# Patient Record
Sex: Male | Born: 1960
Health system: Southern US, Community
[De-identification: ages and names within clinical notes are randomized; demographics above are authoritative.]

## PROBLEM LIST (undated history)

## (undated) DIAGNOSIS — Z8489 Family history of other specified conditions: Secondary | ICD-10-CM

## (undated) DIAGNOSIS — Z973 Presence of spectacles and contact lenses: Secondary | ICD-10-CM

## (undated) DIAGNOSIS — Z8719 Personal history of other diseases of the digestive system: Secondary | ICD-10-CM

## (undated) DIAGNOSIS — Z72820 Sleep deprivation: Secondary | ICD-10-CM

## (undated) DIAGNOSIS — T7840XA Allergy, unspecified, initial encounter: Secondary | ICD-10-CM

## (undated) DIAGNOSIS — K219 Gastro-esophageal reflux disease without esophagitis: Secondary | ICD-10-CM

## (undated) DIAGNOSIS — I10 Essential (primary) hypertension: Secondary | ICD-10-CM

## (undated) DIAGNOSIS — R918 Other nonspecific abnormal finding of lung field: Secondary | ICD-10-CM

## (undated) DIAGNOSIS — K224 Dyskinesia of esophagus: Secondary | ICD-10-CM

## (undated) DIAGNOSIS — G4700 Insomnia, unspecified: Secondary | ICD-10-CM

## (undated) DIAGNOSIS — Z87442 Personal history of urinary calculi: Secondary | ICD-10-CM

## (undated) DIAGNOSIS — E559 Vitamin D deficiency, unspecified: Secondary | ICD-10-CM

## (undated) DIAGNOSIS — K4090 Unilateral inguinal hernia, without obstruction or gangrene, not specified as recurrent: Secondary | ICD-10-CM

## (undated) DIAGNOSIS — J45909 Unspecified asthma, uncomplicated: Secondary | ICD-10-CM

## (undated) DIAGNOSIS — J329 Chronic sinusitis, unspecified: Secondary | ICD-10-CM

## (undated) DIAGNOSIS — K5730 Diverticulosis of large intestine without perforation or abscess without bleeding: Secondary | ICD-10-CM

## (undated) DIAGNOSIS — E349 Endocrine disorder, unspecified: Secondary | ICD-10-CM

## (undated) DIAGNOSIS — R053 Chronic cough: Secondary | ICD-10-CM

## (undated) DIAGNOSIS — K5792 Diverticulitis of intestine, part unspecified, without perforation or abscess without bleeding: Secondary | ICD-10-CM

## (undated) DIAGNOSIS — A6920 Lyme disease, unspecified: Secondary | ICD-10-CM

## (undated) DIAGNOSIS — R6881 Early satiety: Secondary | ICD-10-CM

## (undated) DIAGNOSIS — G4730 Sleep apnea, unspecified: Secondary | ICD-10-CM

## (undated) DIAGNOSIS — R05 Cough: Secondary | ICD-10-CM

## (undated) HISTORY — PX: VASECTOMY: SHX75

## (undated) HISTORY — DX: Unspecified asthma, uncomplicated: J45.909

## (undated) HISTORY — PX: HERNIA REPAIR: SHX51

## (undated) HISTORY — PX: TOE SURGERY: SHX1073

## (undated) HISTORY — DX: Diverticulosis of large intestine without perforation or abscess without bleeding: K57.30

## (undated) HISTORY — PX: UPPER GASTROINTESTINAL ENDOSCOPY: SHX188

## (undated) HISTORY — DX: Sleep deprivation: Z72.820

## (undated) HISTORY — DX: Chronic sinusitis, unspecified: J32.9

## (undated) HISTORY — DX: Lyme disease, unspecified: A69.20

## (undated) HISTORY — DX: Allergy, unspecified, initial encounter: T78.40XA

## (undated) HISTORY — PX: COLONOSCOPY: SHX174

---
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
## 2000-12-01 ENCOUNTER — Encounter: Admission: RE | Admit: 2000-12-01 | Discharge: 2000-12-01 | Payer: Self-pay | Admitting: Otolaryngology

## 2000-12-01 ENCOUNTER — Encounter: Payer: Self-pay | Admitting: Otolaryngology

## 2001-12-14 ENCOUNTER — Encounter: Payer: Self-pay | Admitting: Internal Medicine

## 2001-12-14 ENCOUNTER — Ambulatory Visit (HOSPITAL_COMMUNITY): Admission: RE | Admit: 2001-12-14 | Discharge: 2001-12-14 | Payer: Self-pay | Admitting: Internal Medicine

## 2001-12-18 ENCOUNTER — Ambulatory Visit (HOSPITAL_COMMUNITY): Admission: RE | Admit: 2001-12-18 | Discharge: 2001-12-18 | Payer: Self-pay | Admitting: Surgery

## 2004-02-13 ENCOUNTER — Ambulatory Visit (HOSPITAL_COMMUNITY): Admission: RE | Admit: 2004-02-13 | Discharge: 2004-02-13 | Payer: Self-pay | Admitting: Internal Medicine

## 2010-09-24 ENCOUNTER — Encounter: Payer: Self-pay | Admitting: Internal Medicine

## 2010-11-19 ENCOUNTER — Encounter (INDEPENDENT_AMBULATORY_CARE_PROVIDER_SITE_OTHER): Payer: Self-pay

## 2010-11-23 ENCOUNTER — Ambulatory Visit
Admission: RE | Admit: 2010-11-23 | Discharge: 2010-11-23 | Payer: Self-pay | Source: Home / Self Care | Attending: Gastroenterology | Admitting: Gastroenterology

## 2010-12-07 NOTE — Letter (Signed)
Summary: Pre Visit Letter Revised  [facility]  [address] [place], [place] [address]   Phone: [phone]  Fax: [phone]        09/24/2010 MRN: [id number]  [facility] [address] [place], [place]  [address]             Procedure Date:  12-07-10 11:30am           Dr Leone Payor   Welcome to the Gastroenterology Division at [facility].    You are scheduled to see a nurse for your pre-procedure visit on 11-23-2010 at 10am on the 3rd floor at [facility], [address]  We ask that you try to arrive at our office 15 minutes prior to your appointment time to allow for check-in.  Please take a minute to review the attached form.  If you answer "Yes" to one or more of the questions on the first page, we ask that you call the person listed at your earliest opportunity.  If you answer "No" to all of the questions, please complete the rest of the form and bring it to your appointment.    Your nurse visit will consist of discussing your medical and surgical history, your immediate family medical history, and your medications.   If you are unable to list all of your medications on the form, please bring the medication bottles to your appointment and we will list them.  We will need to be aware of both prescribed and over the counter drugs.  We will need to know exact dosage information as well.    Please be prepared to read and sign documents such as consent forms, a financial agreement, and acknowledgement forms.  If necessary, and with your consent, a friend or relative is welcome to sit-in on the nurse visit with you.  Please bring your insurance card so that we may make a copy of it.  If your insurance requires a referral to see a specialist, please bring your referral form from your primary care physician.  No co-pay is required for this nurse visit.     If you cannot keep your appointment, please call [phone] to cancel or reschedule prior to your  appointment date.  This allows [place] the opportunity to schedule an appointment for another patient in need of care.    Thank you for choosing [facility] for your medical needs.  We appreciate the opportunity to care for you.  Please visit [place] at our website  to learn more about our practice.  Sincerely, The Gastroenterology Division

## 2010-12-09 NOTE — Letter (Signed)
Summary: [facility] Instructions  [facility]  [address] [place], [place] [address]   Phone: [phone]  Fax: [phone]       Jay Brooks    [birth year]    MRN: [id number]        Procedure Day Dorna Bloom:  Jay Brooks  12/21/10     Arrival Time:  9:00AM     Procedure Time:  10:00AM     Location of Procedure:                    [facility] (4th Floor)  PREPARATION FOR COLONOSCOPY WITH MOVIPREP   Starting 5 days prior to your procedure 12/16/10 do not eat nuts, seeds, popcorn, corn, beans, peas,  salads, or any raw vegetables.  Do not take any fiber supplements (e.g. Metamucil, Citrucel, and Benefiber).  THE DAY BEFORE YOUR PROCEDURE         DATE: 12/20/10  DAY: MONDAY  1.  Drink clear liquids the entire day-NO SOLID FOOD  2.  Do not drink anything colored red or purple.  Avoid juices with pulp.  No orange juice.  3.  Drink at least 64 oz. (8 glasses) of fluid/clear liquids during the day to prevent dehydration and help the prep work efficiently.  CLEAR LIQUIDS INCLUDE: Water Jello Ice Popsicles Tea (sugar ok, no milk/cream) Powdered fruit flavored drinks Coffee (sugar ok, no milk/cream) Gatorade Juice: apple, white grape, white cranberry  Lemonade Clear bullion, consomm, broth Carbonated beverages (any kind) Strained chicken noodle soup Hard Candy                             4.  In the morning, mix first dose of MoviPrep solution:    Empty 1 Pouch A and 1 Pouch B into the disposable container    Add lukewarm drinking water to the top line of the container. Mix to dissolve    Refrigerate (mixed solution should be used within 24 hrs)  5.  Begin drinking the prep at 5:00 p.m. The MoviPrep container is divided by 4 marks.   Every 15 minutes drink the solution down to the next mark (approximately 8 oz) until the full liter is complete.   6.  Follow completed prep with 16 oz of clear liquid of your choice (Nothing red or purple).   Continue to drink clear liquids until bedtime.  7.  Before going to bed, mix second dose of MoviPrep solution:    Empty 1 Pouch A and 1 Pouch B into the disposable container    Add lukewarm drinking water to the top line of the container. Mix to dissolve    Refrigerate  THE DAY OF YOUR PROCEDURE      DATE: 12/21/10   DAY: TUESDAY  Beginning at 5:00AM (5 hours before procedure):         1. Every 15 minutes, drink the solution down to the next mark (approx 8 oz) until the full liter is complete.  2. Follow completed prep with 16 oz. of clear liquid of your choice.    3. You may drink clear liquids until 8:00AM (2 HOURS BEFORE PROCEDURE).   MEDICATION INSTRUCTIONS  Unless otherwise instructed, you should take regular prescription medications with a small sip of water   as early as possible the morning of your procedure.         OTHER INSTRUCTIONS  You will need a responsible adult at  least 52 years of age to accompany you and drive you home.   This person must remain in the waiting room during your procedure.  Wear loose fitting clothing that is easily removed.  Leave jewelry and other valuables at home.  However, you may wish to bring a book to read or  an iPod/MP3 player to listen to music as you wait for your procedure to start.  Remove all body piercing jewelry and leave at home.  Total time from sign-in until discharge is approximately 2-3 hours.  You should go home directly after your procedure and rest.  You can resume normal activities the  day after your procedure.  The day of your procedure you should not:   Drive   Make legal decisions   Operate machinery   Drink alcohol   Return to work  You will receive specific instructions about eating, activities and medications before you leave.    The above instructions have been reviewed and explained to me by   Ulis Rias RN  November 23, 2010 9:52 AM     I fully understand and can verbalize these  instructions _____________________________ Date _________

## 2010-12-09 NOTE — Miscellaneous (Signed)
Summary: Lec previsit  Clinical Lists Changes  Medications: Added new medication of MOVIPREP 100 GM  SOLR (PEG-KCL-NACL-NASULF-NA ASC-C) As per prep instructions. - Signed Rx of MOVIPREP 100 GM  SOLR (PEG-KCL-NACL-NASULF-NA ASC-C) As per prep instructions.;  #1 x 0;  Signed;  Entered by: Ulis Rias RN;  Authorized by: Iva Boop MD, [facility];  Method used: Electron[facility] to [facility] [phone]*, [address], [place], [place], [place]  [address], Ph: [phone], Fax: [phone] Observations: Added new observation of NKA: T (11/23/2010 9:39)    Prescriptions: MOVIPREP 100 GM  SOLR (PEG-KCL-NACL-NASULF-NA ASC-C) As per prep instructions.  #1 x 0   Entered by:   Ulis Rias RN   Authorized by:   Iva Boop MD, [facility]   Signed by:   Ulis Rias RN on 11/23/2010   Method used:   Electronically to        [facility] [phone]* (retail)       [address]       [place], [place]  [address]       Ph: [phone]       Fax: [phone]   RxID:   [id number]

## 2010-12-20 ENCOUNTER — Telehealth: Payer: Self-pay | Admitting: Internal Medicine

## 2010-12-21 ENCOUNTER — Other Ambulatory Visit (AMBULATORY_SURGERY_CENTER): Payer: 59 | Admitting: Gastroenterology

## 2010-12-21 ENCOUNTER — Encounter: Payer: Self-pay | Admitting: Gastroenterology

## 2010-12-21 DIAGNOSIS — K5730 Diverticulosis of large intestine without perforation or abscess without bleeding: Secondary | ICD-10-CM

## 2010-12-21 DIAGNOSIS — Z1211 Encounter for screening for malignant neoplasm of colon: Secondary | ICD-10-CM

## 2010-12-21 LAB — HM COLONOSCOPY

## 2010-12-29 NOTE — Progress Notes (Signed)
Summary: Questions about med.  Phone Note Call from Patient Call back at [phone]   Caller: Patient Call For: Dr. Leone Payor Reason for Call: Talk to Nurse Summary of Call: Has some questions about a med. he is taking before his procedure tomorrow Initial call taken by: Karna Christmas,  December 20, 2010 9:36 AM  Follow-up for Phone Call        Patient had question about a "root" that he takes to help him sleep.  I told him that it would be ok as long as it doesn't cause increased bleeding.  He also wanted to know if the taste of the prep was bad.  I suggested that he have a hard candy while taking the prep.  Sometimes it helps with the prep.   Patient was content with the answers. Follow-up by: Clide Cliff RN,  December 20, 2010 10:21 AM

## 2010-12-29 NOTE — Procedures (Signed)
Summary: Colonoscopy  Patient: Jay Brooks Note: All result statuses are Final unless otherwise noted.  Tests: (1) Colonoscopy (COL)   COL Colonoscopy           DONE     [facility]     [address]     [place], [place]  [address]           COLONOSCOPY PROCEDURE REPORT           PATIENT:  Jay Brooks, Jay Brooks  MR#:  [id number]     BIRTHDATE:  14-Apr-1961, 49 yrs. old  GENDER:  male     ENDOSCOPIST:  Rachael Fee, MD     REF. BY:  Lucky Cowboy, M.D.     PROCEDURE DATE:  12/21/2010     PROCEDURE:  Diagnostic Colonoscopy     ASA CLASS:  Class II     INDICATIONS:  Routine Risk Screening     MEDICATIONS:   Fentanyl 75 mcg IV, Versed 7 mg IV           DESCRIPTION OF PROCEDURE:   After the risks benefits and     alternatives of the procedure were thoroughly explained, informed     consent was obtained.  Digital rectal exam was performed and     revealed no rectal masses.   The LB PCF-H180AL [id number] endoscope     was introduced through the anus and advanced to the cecum, which     was identified by both the appendix and ileocecal valve, without     limitations.  The quality of the prep was good, using MoviPrep.     The instrument was then slowly withdrawn as the colon was fully     examined.     <<PROCEDUREIMAGES>>     FINDINGS:  Mild diverticulosis was found in the sigmoid to     descending colon segments (see image4).  This was otherwise a     normal examination of the colon (see image1, image2, and image5).     Retroflexed views in the rectum revealed no abnormalities.    The     scope was then withdrawn from the patient and the procedure     completed.     COMPLICATIONS:  None           ENDOSCOPIC IMPRESSION:     1) Mild diverticulosis in the sigmoid to descending colon     segments     2) Otherwise normal examination; no polyps or cancers           RECOMMENDATIONS:     1) You should continue to follow colorectal cancer screening     guidelines for "routine risk"  patients with a repeat colonoscopy     in 10 years. There is no need for FOBT (stool) testing for at     least 5 years.           REPEAT EXAM:  10 years           ______________________________     Rachael Fee, MD           n.     eSIGNED:   Rachael Fee at 12/21/2010 10:35 AM           [place], [phone]  Note: An exclamation mark (!) indicates a result that was not dispersed into the flowsheet. Document Creation Date: 12/21/2010 10:36 AM _______________________________________________________________________  (1) Order result status: Final Collection or observation date-time: 12/21/2010 10:19 Requested date-time:  Receipt date-time:  Reported date-time:  Referring Physician:   Ordering Physician: Rob Bunting [phone]) Specimen Source:  Source: Launa Grill Order Number: [id number] Lab site:   Appended Document: Colonoscopy    Clinical Lists Changes  Observations: Added new observation of COLONNXTDUE: 12/2020 (12/21/2010 13:05)

## 2011-02-06 HISTORY — PX: COLONOSCOPY: SHX174

## 2011-03-25 NOTE — Op Note (Signed)
[facility]  Patient:    [name], [name] Visit Number: [id number] MRN: [id number]          Service Type: DSU Location: DAY Attending Physician:  Shelly Rubenstein Dictated by:   Abigail Miyamoto, M.D. Proc. Date: 12/18/01 Admit Date:  12/18/2001                             Operative Report  PREOPERATIVE DIAGNOSIS:  Left inguinal hernia.  POSTOPERATIVE DIAGNOSIS:  Left inguinal hernia.  PROCEDURE:  Laparoscopic left inguinal hernia repair with mesh.  SURGEON:  Abigail Miyamoto, M.D.  ANESTHESIA:  General endotracheal and 0.25% Marcaine plain.  ESTIMATED BLOOD LOSS:  Minimal.  PROCEDURE IN DETAIL:  The patient was brought to the operating room and identified as Jay Brooks. He was placed supine on the operating room table and general anesthesia was induced. His abdomen and groin were then prepped and draped in the usual sterile fashion. Using a #15 blade, a small transverse incision was made below the umbilicus. Incision was carried down to the fascia which was then opened with the scalpel. The rectus muscles were then identified and elevated. The dissecting balloon was then passed underneath the rectus sheath and down to the pubis. The dissecting balloon was then insufflated dissecting out the preperitoneal space. Next, the large balloon was removed and a smaller balloon _______ was placed through the incision. Insufflation was then begun with CO2 in the preperitoneal space. Next, two 5 mm ports were placed _______ midline under direct vision. Dissection was then carried out in the inguinal area. The testicular cord and its structures were easily identified. The patient was found to have a large internal ring as well as a small indirect hernia sac which was easily reduced. Coopers ligament was easily identified. The right side was examined and no hernia was identified on the right. Next, a piece of precut Prolene mesh from [employer] was brought  into the field. The mesh was then placed in onlay fashion over the left inguinal area and cord structures. The mesh was then tacked in place with surgical tacker to Coopers ligament and then up the medial abdominal wall and out laterally with several tacks. Again, excellent coverage of the internal ring and cord structures appeared to be identified. The preperitoneal space was then deflated. The mesh appeared to lay appropriately as this space collapsed. The midline fascia was then closed with a figure-of-eight 0 Vicryl suture. All incisions were anesthetized with 0.25% Marcaine and then closed with 4-0 Monocryl subcuticular sutures. Steri-Strips, gauze and tape was applied. The patient tolerated the procedure well. All sponge, needle, and instrument counts were correct at the end of the procedure. The patient was then extubated in the operating room and taken in stable condition to the recovery room. Dictated by:   Abigail Miyamoto, M.D. Attending Physician:  Shelly Rubenstein DD:  12/18/01 TD:  12/18/01 Job: [id number]

## 2011-12-29 ENCOUNTER — Other Ambulatory Visit (INDEPENDENT_AMBULATORY_CARE_PROVIDER_SITE_OTHER): Payer: Self-pay | Admitting: Otolaryngology

## 2011-12-29 DIAGNOSIS — J329 Chronic sinusitis, unspecified: Secondary | ICD-10-CM

## 2012-01-03 ENCOUNTER — Ambulatory Visit
Admission: RE | Admit: 2012-01-03 | Discharge: 2012-01-03 | Disposition: A | Discharge status: Home / Self Care | Payer: 59 | Source: Ambulatory Visit | Attending: Otolaryngology | Admitting: Otolaryngology

## 2012-01-03 DIAGNOSIS — J329 Chronic sinusitis, unspecified: Secondary | ICD-10-CM

## 2012-01-09 ENCOUNTER — Other Ambulatory Visit: Payer: 59

## 2012-01-11 ENCOUNTER — Other Ambulatory Visit (INDEPENDENT_AMBULATORY_CARE_PROVIDER_SITE_OTHER): Payer: Self-pay | Admitting: Otolaryngology

## 2012-01-11 DIAGNOSIS — R42 Dizziness and giddiness: Secondary | ICD-10-CM

## 2012-01-11 DIAGNOSIS — H9193 Unspecified hearing loss, bilateral: Secondary | ICD-10-CM

## 2012-01-12 ENCOUNTER — Ambulatory Visit
Admission: RE | Admit: 2012-01-12 | Discharge: 2012-01-12 | Disposition: A | Discharge status: Home / Self Care | Payer: 59 | Source: Ambulatory Visit | Attending: Otolaryngology | Admitting: Otolaryngology

## 2012-01-12 DIAGNOSIS — R42 Dizziness and giddiness: Secondary | ICD-10-CM

## 2012-01-12 DIAGNOSIS — H9193 Unspecified hearing loss, bilateral: Secondary | ICD-10-CM

## 2012-01-12 MED ORDER — GADOBENATE DIMEGLUMINE 529 MG/ML IV SOLN
15.0000 mL | Freq: Once | INTRAVENOUS | Status: AC | PRN
  Administered 2012-01-12: 15 mL via INTRAVENOUS

## 2012-11-26 ENCOUNTER — Encounter (HOSPITAL_COMMUNITY): Payer: Self-pay

## 2012-11-26 ENCOUNTER — Other Ambulatory Visit (HOSPITAL_COMMUNITY): Payer: Self-pay | Admitting: Internal Medicine

## 2012-11-26 ENCOUNTER — Ambulatory Visit (HOSPITAL_COMMUNITY)
Admission: RE | Admit: 2012-11-26 | Discharge: 2012-11-26 | Disposition: A | Discharge status: Home / Self Care | Payer: 59 | Source: Ambulatory Visit | Attending: Internal Medicine | Admitting: Internal Medicine

## 2012-11-26 DIAGNOSIS — R062 Wheezing: Secondary | ICD-10-CM | POA: Insufficient documentation

## 2012-11-26 DIAGNOSIS — R059 Cough, unspecified: Secondary | ICD-10-CM | POA: Insufficient documentation

## 2012-11-26 DIAGNOSIS — R05 Cough: Secondary | ICD-10-CM | POA: Insufficient documentation

## 2012-12-06 ENCOUNTER — Ambulatory Visit (INDEPENDENT_AMBULATORY_CARE_PROVIDER_SITE_OTHER): Payer: 59 | Admitting: Emergency Medicine

## 2012-12-06 ENCOUNTER — Encounter: Payer: Self-pay | Admitting: Emergency Medicine

## 2012-12-06 VITALS — BP 120/80 | HR 71 | Temp 97.6°F | Ht 69.0 in | Wt 161.0 lb

## 2012-12-06 DIAGNOSIS — R05 Cough: Secondary | ICD-10-CM

## 2012-12-06 DIAGNOSIS — J310 Chronic rhinitis: Secondary | ICD-10-CM

## 2012-12-06 DIAGNOSIS — R059 Cough, unspecified: Secondary | ICD-10-CM

## 2012-12-06 DIAGNOSIS — K219 Gastro-esophageal reflux disease without esophagitis: Secondary | ICD-10-CM

## 2012-12-06 DIAGNOSIS — R053 Chronic cough: Secondary | ICD-10-CM

## 2012-12-06 NOTE — Progress Notes (Signed)
Subjective:    Patient ID: Jay Brooks, male    DOB: 11/23/1960, 52 y.o.   MRN: [id number]  HPI 52 yo never smoker, hx allergic rhinitis, chronic sinus disease and drainage. Notes that he has been having some UA noise and wheeze for over a year, evolving cough over about 7 months. The cough has primarily been dry but has been productive of clear to green in association. Seems to predictably get more drainage and sinusitis Feb- April.  He doesn't feel that he is getting a lot of drainage right now. hye has GERD that may be better on protonix, although he still takes Tums for breakthrough  Protonix since June, prilosec before that, singulair is brand new but has been tried before, has been treated empircally with Advair, pulmicort, tudorza.    Review of Systems  Constitutional: Negative for fever and unexpected weight change.  HENT: Positive for congestion and postnasal drip. Negative for ear pain, nosebleeds, sore throat, rhinorrhea, sneezing, trouble swallowing, dental problem and sinus pressure.   Eyes: Negative for redness and itching.  Respiratory: Positive for cough and wheezing. Negative for chest tightness and shortness of breath.   Cardiovascular: Negative for palpitations and leg swelling.  Gastrointestinal: Negative for nausea and vomiting.  Genitourinary: Negative for dysuria.  Musculoskeletal: Negative for joint swelling.  Skin: Negative for rash.  Neurological: Positive for headaches.  Hematological: Does not bruise/bleed easily.  Psychiatric/Behavioral: Negative for dysphoric mood. The patient is not nervous/anxious.    Past Medical History  Diagnosis Date  . Chronic sinusitis   . Sleep deficient      Family History  Problem Relation Age of Onset  . Cancer Maternal Grandmother     breast     History   Social History  . Marital Status: Married    Spouse Name: N/A    Number of Children: 1  . Years of Education: N/A   Occupational History  . [employer]   Social History Main Topics  . Smoking status: Never Smoker   . Smokeless tobacco: Never Used  . Alcohol Use: No  . Drug Use: No  . Sexually Active: Not on file   Other Topics Concern  . Not on file   Social History Narrative  . No narrative on file     Allergies  Allergen Reactions  . Other     Acne medication caused blisters     No outpatient prescriptions prior to visit.    Last reviewed on 12/06/2012 11:23 AM by Lazarus Salines, RN       Objective:   Physical Exam Filed Vitals:   12/06/12 1125  BP: 120/80  Pulse: 71  Temp: 97.6 F (36.4 C)   Gen: Pleasant, well-nourished, in no distress,  normal affect  ENT: No lesions,  mouth clear,  oropharynx clear, no postnasal drip  Neck: No JVD, no TMG, no carotid bruits  Lungs: No use of accessory muscles, no dullness to percussion, clear without rales or rhonchi  Cardiovascular: RRR, heart sounds normal, no murmur or gallops, no peripheral edema  Musculoskeletal: No deformities, no cyanosis or clubbing  Neuro: alert, non focal  Skin: Warm, no lesions or rashes     Assessment & Plan:  Chronic cough Pt describes contributions of both chronic rhinitis and moderately controlled GERD on current PPI. Suspect we will need to more-aggressively treat both. He notes that he has already been on [place], loratadine, nasal steroid, atrovent nasal spray, astelin nasal spray in the past  without benefit. He will likely need to be restarted on some or most of these, but for now will try to pursue the poorly controlled GERD to see if he gets more benefit. Will double his PPI for the next 2 weeks, then go back to qd. Follow in 1 month to assess status. Will check PFT at [facility] to r/o occult asthma (doubt based on failure to benefit from BDs in the past). Discussed with him the possibility that we may pursue FOB and anatomical exam if we do not get relief of or explanation for his cough with the above.

## 2012-12-06 NOTE — Assessment & Plan Note (Addendum)
Pt describes contributions of both chronic rhinitis and moderately controlled GERD on current PPI. Suspect we will need to more-aggressively treat both. He notes that he has already been on [place], loratadine, nasal steroid, atrovent nasal spray, astelin nasal spray in the past without benefit. He will likely need to be restarted on some or most of these, but for now will try to pursue the poorly controlled GERD to see if he gets more benefit. Will double his PPI for the next 2 weeks, then go back to qd. Follow in 1 month to assess status. Will check PFT at [facility] to r/o occult asthma (doubt based on failure to benefit from BDs in the past). Discussed with him the possibility that we may pursue FOB and anatomical exam if we do not get relief of or explanation for his cough with the above.

## 2012-12-06 NOTE — Patient Instructions (Addendum)
Please increase your protonix to twice a day for the next 2 weeks, and then go back to once a day Continue singulair for now We will consider restarting an more aggressive allergy regimen in the future.  Follow with Dr Delton Coombes in 1 month

## 2012-12-14 ENCOUNTER — Telehealth: Payer: Self-pay | Admitting: Emergency Medicine

## 2012-12-14 NOTE — Telephone Encounter (Signed)
Called and spoke with pt and he stated that he will try this regimen for a few days to see if anything gets better.  Pt stated that he has nasonex and he will use this.  Pt is to call back in a few days with how he is doing.  He stated that he has been out of work for about a month with all of this going on and does not want to waste another week of being out of work.

## 2012-12-14 NOTE — Telephone Encounter (Signed)
The best thing to try would be for him to restart loratadine, nasal steroid, atrovent nasal spray while he is on his protonix. This would treat all the possible contributors to his cough. If we get nowhere then I believe he needs bronchoscopy to visualize his cords and posterior pharynx.

## 2012-12-14 NOTE — Telephone Encounter (Signed)
Called and spoke with pt and he stated that he is not any better.  The protonix seemed to help his stomach but the other symptoms remain the same.   He stated that he was coughing so hard this morning that he did vomit x 2.  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  He is wanting recs from [facility].  Please advise.  Thanks  Allergies  Allergen Reactions  . Other     Acne medication caused blisters

## 2012-12-19 ENCOUNTER — Institutional Professional Consult (permissible substitution): Payer: 59 | Admitting: Emergency Medicine

## 2013-01-02 ENCOUNTER — Ambulatory Visit (INDEPENDENT_AMBULATORY_CARE_PROVIDER_SITE_OTHER): Payer: 59 | Admitting: Internal Medicine

## 2013-01-02 ENCOUNTER — Encounter: Payer: Self-pay | Admitting: Internal Medicine

## 2013-01-02 ENCOUNTER — Telehealth: Payer: Self-pay | Admitting: Emergency Medicine

## 2013-01-02 VITALS — BP 118/80 | HR 80 | Temp 97.5°F | Ht 69.0 in | Wt 163.4 lb

## 2013-01-02 DIAGNOSIS — R053 Chronic cough: Secondary | ICD-10-CM

## 2013-01-02 DIAGNOSIS — R059 Cough, unspecified: Secondary | ICD-10-CM

## 2013-01-02 DIAGNOSIS — R05 Cough: Secondary | ICD-10-CM

## 2013-01-02 MED ORDER — FAMOTIDINE 20 MG PO TABS: ORAL_TABLET | ORAL | Status: DC

## 2013-01-02 MED ORDER — PREDNISONE (PAK) 10 MG PO TABS: ORAL_TABLET | ORAL | Status: DC

## 2013-01-02 MED ORDER — TRAMADOL HCL 50 MG PO TABS: ORAL_TABLET | ORAL | Status: DC

## 2013-01-02 NOTE — Progress Notes (Signed)
Subjective:    Patient ID: Jay Brooks, male    DOB: 1961-07-26    MRN: [id number]  HPI 36 yowm never smoker, hx allergic rhinitis (last [facility] eval neg), recurrent sinus infections 2009  Assoc with a cough  11/26/12 [facility] eval with imp of uacs rec Please increase your protonix to twice a day for the next 2 weeks, and then go back to once a day Continue singulair for now  01/02/2013   Pulmonary eval no sinus infection since summer of 2013 but cough started December 2013  off and on. Also has wheezing with laughing even in absence of sinus infection. Much worse x 4 days with hoarsness also, min prod of white mucus. Best rx is Rob [facility]. Not convinced anything really helps and never stopped coughing even on high dose ppi and singulair    Kouffman Reflux v Neurogenic Cough Differentiator Reflux Comments  Do you awaken from a sound sleep coughing violently?                            With trouble breathing? no   Do you have choking episodes when you cannot  Get enough air, gasping for air ?              Yes   Do you usually cough when you lie down into  The bed, or when you just lie down to rest ?                          some   Do you usually cough after meals or eating?         no   Do you cough when (or after) you bend over?    no   GERD SCORE     Kouffman Reflux v Neurogenic Cough Differentiator Neurogenic   Do you more-or-less cough all day long? yes   Does change of temperature make you cough? no   Does laughing or chuckling cause you to cough? yes   Do fumes (perfume, automobile fumes, burned  Toast, etc.,) cause you to cough ?      no   Does speaking, singing, or talking on the phone cause you to cough   ?               sometimes   Neurogenic/Airway score       No obvious daytime variabilty or assoc sob or cp or chest tightness, subjective wheeze overt sinus or hb symptoms. No unusual exp hx or h/o childhood pna/ asthma or premature birth to his knowledge.   Sleeping ok  without nocturnal  or early am exacerbation  of respiratory  c/o's or need for noct saba. Also denies any obvious fluctuation of symptoms with weather or environmental changes or other aggravating or alleviating factors except as outlined above   ROS  The following are not active complaints unless bolded sore throat, dysphagia, dental problems, itching, sneezing,  nasal congestion or excess/ purulent secretions, ear ache,   fever, chills, sweats, unintended wt loss, pleuritic or exertional cp, hemoptysis,  orthopnea pnd or leg swelling, presyncope, palpitations, heartburn, abdominal pain, anorexia, nausea, vomiting, diarrhea  or change in bowel or urinary habits, change in stools or urine, dysuria,hematuria,  rash, arthralgias, visual complaints, headache, numbness weakness or ataxia or problems with walking or coordination,  change in mood/affect or memory.       Past Medical History  Diagnosis Date  . Chronic sinusitis   . Sleep deficient      Family History  Problem Relation Age of Onset  . Cancer Maternal Grandmother     breast     History   Social History  . Marital Status: Married    Spouse Name: N/A    Number of Children: 1  . Years of Education: N/A   Occupational History  . [employer]   Social History Main Topics  . Smoking status: Never Smoker   . Smokeless tobacco: Never Used  . Alcohol Use: No  . Drug Use: No  . Sexually Active: Not on file   Other Topics Concern  . Not on file   Social History Narrative  . No narrative on file     Allergies  Allergen Reactions  . Other     Acne medication caused blisters              Objective:   Physical Exam    Gen: Pleasant, well-nourished, in no distress,  normal affect  ENT: No lesions,  mouth clear,  oropharynx clear, no postnasal drip  Neck: No JVD, no TMG, no carotid bruits  Lungs: No use of accessory muscles, no dullness to percussion, clear without rales or rhonchi  Cardiovascular:  RRR, heart sounds normal, no murmur or gallops, no peripheral edema  Musculoskeletal: No deformities, no cyanosis or clubbing  Neuro: alert, non focal  Skin: Warm, no lesions or rashes   cxr reviewed 11/26/12  Peribronchial thickening with mild hyperaeration question  bronchitis versus asthma.  No acute infiltrate.      Assessment & Plan:

## 2013-01-02 NOTE — Patient Instructions (Addendum)
The key to effective treatment for your cough is eliminating the non-stop cycle of cough you're stuck in long enough to let your airway heal completely and then see if there is anything still making you cough once you stop the cough suppression, but this should take no more than 5 days to figuure out  First take delsym two tsp every 12 hours and supplement if needed with  tramadol 50 mg up to 2 every 4 hours to suppress the urge to cough. Swallowing water or using ice chips/non mint and menthol containing candies (such as lifesavers or sugarless jolly ranchers) are also effective.  You should rest your voice and avoid activities that you know make you cough.  Once you have eliminated the cough for 3 straight days try reducing the tramadol first,  then the delsym as tolerated.    Try prilosec 20mg   Take 30-60 min before first meal of the day and Pepcid 20 mg one bedtime until cough is completely gone for at least a week without the need for cough suppression  I think of reflux for chronic cough like I do oxygen for fire (doesn't cause the fire but once you get the oxygen suppressed it usually goes away regardless of the exact cause).  GERD (REFLUX)  is an extremely common cause of respiratory symptoms, many times with no significant heartburn at all.    It can be treated with medication, but also with lifestyle changes including avoidance of late meals, excessive alcohol, smoking cessation, and avoid fatty foods, chocolate, peppermint, colas, red wine, and acidic juices such as orange juice.  NO MINT OR MENTHOL PRODUCTS SO NO COUGH DROPS  USE SUGARLESS CANDY INSTEAD (jolley ranchers or Stover's)  NO OIL BASED VITAMINS - use powdered substitutes.    Prednisone 10 mg take  4 each am x 2 days,   2 each am x 2 days,  1 each am x2days and stop   Please schedule a follow up office visit in 2 weeks, sooner if needed

## 2013-01-02 NOTE — Telephone Encounter (Signed)
I spoke with the Jay Brooks and he states he is not feeling any better since OV with Dr. Delton Coombes and had to miss work again today. He has an appt with RB on 3-3 but wants to be seen today. I advised the Jay Brooks that RB is not available but that we would be happy to get him in with another provider. Jay Brooks set to see MW today at 3:15pm. Carron Curie, CMA

## 2013-01-06 NOTE — Assessment & Plan Note (Addendum)
The most common causes of chronic cough in immunocompetent adults include the following: upper airway cough syndrome (UACS), previously referred to as postnasal drip syndrome (PNDS), which is caused by variety of rhinosinus conditions; (2) asthma; (3) GERD; (4) chronic bronchitis from cigarette smoking or other inhaled environmental irritants; (5) nonasthmatic eosinophilic bronchitis; and (6) bronchiectasis.   These conditions, singly or in combination, have accounted for up to 94% of the causes of chronic cough in prospective studies.   Other conditions have constituted no >6% of the causes in prospective studies These have included bronchogenic carcinoma, chronic interstitial pneumonia, sarcoidosis, left ventricular failure, ACEI-induced cough, and aspiration from a condition associated with pharyngeal dysfunction.    Chronic cough is often simultaneously caused by more than one condition. A single cause has been found from 38 to 82% of the time, multiple causes from 18 to 62%. Multiply caused cough has been the result of three diseases up to 42% of the time.       Agree with Dr Delton Coombes this is most likely  Classic Upper airway cough syndrome, so named because it's frequently impossible to sort out how much is  CR/sinusitis with freq throat clearing (which can be related to primary GERD)   vs  causing  secondary (" extra esophageal")  GERD from wide swings in gastric pressure that occur with throat clearing, often  promoting self use of mint and menthol lozenges that reduce the lower esophageal sphincter tone and exacerbate the problem further in a cyclical fashion.   These are the same pts (now being labeled as having "irritable larynx syndrome" by some cough centers) who not infrequently have a history of having failed to tolerate ace inhibitors,  dry powder inhalers or biphosphonates or report having atypical reflux symptoms that don't respond to standard doses of PPI , and are easily confused as  having aecopd or asthma flares by even experienced allergists/ pulmonologists.  I had an extended discussion with the patient today lasting 15 to 20 minutes of a 25 minute visit on the following issues: The standardized cough guidelines published in Chest by [facility] in 2006 are still the best available and consist of a multiple step process (up to 12!) , not a single office visit,  and are intended  to address this problem logically,  with an alogrithm dependent on response to empiric treatment at  each progressive step  to determine a specific diagnosis with  minimal addtional testing needed. Therefore if adherence is an issue or can't be accurately verified,  it's very unlikely the standard evaluation and treatment will be successful here.    Furthermore, response to therapy (other than acute cough suppression, which should only be used short term with avoidance of narcotic containing cough syrups if possible), can be a gradual process for which the patient may perceive immediate benefit.  Unlike going to an eye doctor where the best perscription is almost always the first one and is immediately effective, this is almost never the case in the management of chronic cough syndromes. Therefore the patient needs to commit up front to consistently adhere to recommendations  for up to 6 weeks of therapy directed at the likely underlying problem(s) before the response can be reasonably evaluated.    Will offer a cyclical cough regimen then regroup either here or with Dr Delton Coombes.

## 2013-01-07 ENCOUNTER — Ambulatory Visit: Payer: 59 | Admitting: Emergency Medicine

## 2013-01-16 ENCOUNTER — Ambulatory Visit (INDEPENDENT_AMBULATORY_CARE_PROVIDER_SITE_OTHER): Payer: 59 | Admitting: Internal Medicine

## 2013-01-16 ENCOUNTER — Encounter: Payer: Self-pay | Admitting: Internal Medicine

## 2013-01-16 VITALS — BP 112/76 | HR 72 | Temp 97.5°F | Ht 69.0 in | Wt 166.0 lb

## 2013-01-16 DIAGNOSIS — R053 Chronic cough: Secondary | ICD-10-CM

## 2013-01-16 DIAGNOSIS — R059 Cough, unspecified: Secondary | ICD-10-CM

## 2013-01-16 DIAGNOSIS — R05 Cough: Secondary | ICD-10-CM

## 2013-01-16 NOTE — Assessment & Plan Note (Signed)
Better though did not follow all the parts of the cyclical cough protocol and still has some urge to clear throat typical of  Classic Upper airway cough syndrome, so named because it's frequently impossible to sort out how much is  CR/sinusitis with freq throat clearing (which can be related to primary GERD)   vs  causing  secondary (" extra esophageal")  GERD from wide swings in gastric pressure that occur with throat clearing, often  promoting self use of mint and menthol lozenges that reduce the lower esophageal sphincter tone and exacerbate the problem further in a cyclical fashion.   These are the same pts (now being labeled as having "irritable larynx syndrome" by some cough centers) who not infrequently have a history of having failed to tolerate ace inhibitors,  dry powder inhalers or biphosphonates or report having atypical reflux symptoms that don't respond to standard doses of PPI , and are easily confused as having aecopd or asthma flares by even experienced allergists/ pulmonologists.   Would continue gerd rx as is and if flares on rx do the MCT on gerd rx to eliminate cough variant asthma from the list of suspects and consider trial of neurontin if allowed to fly on it.  If does great on gerd rx then flares off it then refer to GI   NB The standardized cough guidelines published in [facility] by [place] in 2006 are still the best available and consist of a multiple step process (up to 12!) , not a single office visit,  and are intended  to address this problem logically,  with an alogrithm dependent on response to empiric treatment at  each progressive step  to determine a specific diagnosis with  minimal addtional testing needed. Therefore if adherence is an issue or can't be accurately verified,  it's very unlikely the standard evaluation and treatment will be successful here.    Furthermore, response to therapy (other than acute cough suppression, which should only be used short term with  avoidance of narcotic containing cough syrups if possible), can be a gradual process for which the patient may perceive immediate benefit.  Unlike going to an eye doctor where the best perscription is almost always the first one and is immediately effective, this is almost never the case in the management of chronic cough syndromes. Therefore the patient needs to commit up front to consistently adhere to recommendations  for up to 6 weeks of therapy directed at the likely underlying problem(s) before the response can be reasonably evaluated.

## 2013-01-16 NOTE — Patient Instructions (Addendum)
The key to effective treatment for your cough is eliminating the non-stop cycle of cough you're stuck in long enough to let your airway heal completely and then see if there is anything still making you cough once you stop the cough suppression, but this should take no more than 5 days to figuure out  First take delsym two tsp every 12 hours and supplement if needed with  tramadol 50 mg up to 2 every 4 hours to suppress the urge to cough. Swallowing water or using ice chips/non mint and menthol containing candies (such as lifesavers or sugarless jolly ranchers) are also effective.  You should rest your voice and avoid activities that you know make you cough.  Once you have eliminated the cough for 3 straight days try reducing the tramadol first,  then the delsym as tolerated.    Try prilosec 20mg   Take 30-60 min before first meal of the day and Pepcid 20 mg one bedtime until cough is completely gone for at least a week without the need for cough suppression  I think of reflux for chronic cough like I do oxygen for fire (doesn't cause the fire but once you get the oxygen suppressed it usually goes away regardless of the exact cause).  GERD (REFLUX)  is an extremely common cause of respiratory symptoms, many times with no significant heartburn at all.    It can be treated with medication, but also with lifestyle changes including avoidance of late meals, excessive alcohol, smoking cessation, and avoid fatty foods, chocolate, peppermint, colas, red wine, and acidic juices such as orange juice.  NO MINT OR MENTHOL PRODUCTS SO NO COUGH DROPS  USE SUGARLESS CANDY INSTEAD (jolley ranchers or Stover's)  NO OIL BASED VITAMINS - use powdered substitutes.    Prednisone 10 mg take  4 each am x 2 days,   2 each am x 2 days,  1 each am x2days and stop   Prevacid 30 mg before your meal and add a second dose before supper if cough gets worse.  Continue diet  After 30 days try to wean off prevacid to see if  it flares > we will refer to GI  If cough recurs on acid suppression  Return here   > consider neurontin therapy for irritable larynx syndrome after methacholine challenge test to see if you have occult asthma.

## 2013-01-16 NOTE — Progress Notes (Signed)
Subjective:    Patient ID: Jay Brooks, male    DOB: 1961/04/26    MRN: [id number]  HPI 43 yowm never smoker, hx allergic rhinitis (last [facility] eval neg), recurrent sinus infections 2009  Assoc with a cough  11/26/12 [facility] eval with imp of uacs rec Please increase your protonix to twice a day for the next 2 weeks, and then go back to once a day Continue singulair for now  01/02/2013  Acute  Pulmonary eval/ Jay Brooks cc  no sinus infection since summer of 2013 but cough started December 2013  off and on. Also has wheezing with laughing even in absence of sinus infection. Much worse x 4 days with hoarsness also, min prod of white mucus. Best rx is Rob DM. Not convinced anything really helps and never stopped coughing even on high dose ppi and singulair rec  First take delsym two tsp every 12 hours and supplement if needed with  tramadol 50 mg up to 2 every 4 hours to suppress the urge to cough.   Once you have eliminated the cough for 3 straight days try reducing the tramadol first,  then the delsym as tolerated.   dexilant 60 mg   Take 30-60 min before first meal of the day and Pepcid 20 mg one bedtime until cough is completely gone for at least a week without the need for cough suppression GERD diet Prednisone 10 mg take  4 each am x 2 days,   2 each am x 2 days,  1 each am x2days and stop    01/16/2013 f/u ov/Jay Brooks prevacid before bfast and no longer on pepcid at bedtime does have some tickle and urge to clear throat but cough is much better, not using any suppression at al..    Kouffman Reflux v Neurogenic Cough Differentiator Reflux Comments  Do you awaken from a sound sleep coughing violently?                            With trouble breathing? no   Do you have choking episodes when you cannot  Get enough air, gasping for air ?              Resolved on ppi   Do you usually cough when you lie down into  The bed, or when you just lie down to rest ?                          Resolved on ppi    Do you usually cough after meals or eating?         no   Do you cough when (or after) you bend over?    no   GERD SCORE     Kouffman Reflux v Neurogenic Cough Differentiator Neurogenic   Do you more-or-less cough all day long? Tickle comes and goes    Does change of temperature make you cough? no   Does laughing or chuckling cause you to cough? better   Do fumes (perfume, automobile fumes, burned  Toast, etc.,) cause you to cough ?      no   Does speaking, singing, or talking on the phone cause you to cough   ?               Better    Neurogenic/Airway score         No obvious daytime variabilty or assoc sob or  cp or chest tightness, subjective wheeze overt sinus or hb symptoms. No unusual exp hx or h/o childhood pna/ asthma or premature birth to his knowledge.   Sleeping ok without nocturnal  or early am exacerbation  of respiratory  c/o's or need for noct saba. Also denies any obvious fluctuation of symptoms with weather or environmental changes or other aggravating or alleviating factors except as outlined above   ROS  The following are not active complaints unless bolded sore throat, dysphagia, dental problems, itching, sneezing,  nasal congestion or excess/ purulent secretions, ear ache,   fever, chills, sweats, unintended wt loss, pleuritic or exertional cp, hemoptysis,  orthopnea pnd or leg swelling, presyncope, palpitations, heartburn, abdominal pain, anorexia, nausea, vomiting, diarrhea  or change in bowel or urinary habits, change in stools or urine, dysuria,hematuria,  rash, arthralgias, visual complaints, headache, numbness weakness or ataxia or problems with walking or coordination,  change in mood/affect or memory.       Past Medical History  Diagnosis Date  . Chronic sinusitis   . Sleep deficient      Family History  Problem Relation Age of Onset  . Cancer Maternal Grandmother     breast     History   Social History  . Marital Status: Married    Spouse Name:  N/A    Number of Children: 1  . Years of Education: N/A   Occupational History  . [employer]   Social History Main Topics  . Smoking status: Never Smoker   . Smokeless tobacco: Never Used  . Alcohol Use: No  . Drug Use: No  . Sexually Active: Not on file   Other Topics Concern  . Not on file   Social History Narrative  . No narrative on file     Allergies  Allergen Reactions  . Other     Acne medication caused blisters              Objective:   Physical Exam  Wt Readings from Last 3 Encounters:  01/16/13 166 lb (75.297 kg)  01/02/13 163 lb 6.4 oz (74.118 kg)  12/06/12 161 lb (73.029 kg)     Gen: Pleasant, well-nourished, in no distress,  normal affect, occ throat clearing   ENT: No lesions,  mouth clear,  oropharynx clear, no postnasal drip  Neck: No JVD, no TMG, no carotid bruits  Lungs: No use of accessory muscles, no dullness to percussion, clear without rales or rhonchi but  trace pseudowheeze clears with purse lip  Cardiovascular: RRR, heart sounds normal, no murmur or gallops, no peripheral edema  Musculoskeletal: No deformities, no cyanosis or clubbing  Neuro: alert, non focal  Skin: Warm, no lesions or rashes   cxr reviewed 11/26/12  Peribronchial thickening with mild hyperaeration question  bronchitis versus asthma.  No acute infiltrate.      Assessment & Plan:

## 2013-01-18 ENCOUNTER — Telehealth: Payer: Self-pay | Admitting: Internal Medicine

## 2013-01-18 NOTE — Telephone Encounter (Signed)
Patient aware forms are now available for pick up.

## 2013-01-18 NOTE — Telephone Encounter (Signed)
done

## 2013-01-18 NOTE — Telephone Encounter (Signed)
Forms were placed in Dr. Thurston Hole lookat to be completed and signed Please let me know when this is done and I will call the pt to come pick uo Thanks!

## 2013-02-19 ENCOUNTER — Telehealth: Payer: Self-pay | Admitting: Internal Medicine

## 2013-02-19 DIAGNOSIS — K319 Disease of stomach and duodenum, unspecified: Secondary | ICD-10-CM

## 2013-02-19 NOTE — Telephone Encounter (Signed)
I spoke with pt. He stated he is still having a lot of "stomach" troubles. Per pt his cough is better. He is requesting a referral to GI. Please advise MW thanks

## 2013-02-19 NOTE — Telephone Encounter (Signed)
Ok to refer to [facility]

## 2013-02-19 NOTE — Telephone Encounter (Signed)
Order has been placed to [facility], patient aware and nothing further needed at this time.

## 2013-02-20 ENCOUNTER — Encounter: Payer: Self-pay | Admitting: Gastroenterology

## 2013-03-15 ENCOUNTER — Encounter: Payer: Self-pay | Admitting: Gastroenterology

## 2013-03-15 ENCOUNTER — Ambulatory Visit (INDEPENDENT_AMBULATORY_CARE_PROVIDER_SITE_OTHER): Payer: 59 | Admitting: Gastroenterology

## 2013-03-15 VITALS — BP 110/70 | HR 78 | Ht 69.0 in | Wt 162.0 lb

## 2013-03-15 DIAGNOSIS — K3189 Other diseases of stomach and duodenum: Secondary | ICD-10-CM

## 2013-03-15 DIAGNOSIS — R1013 Epigastric pain: Secondary | ICD-10-CM

## 2013-03-15 DIAGNOSIS — K219 Gastro-esophageal reflux disease without esophagitis: Secondary | ICD-10-CM

## 2013-03-15 MED ORDER — BISMUTH SUBSALICYLATE 262 MG PO CHEW: 524.0000 mg | CHEWABLE_TABLET | Freq: Two times a day (BID) | ORAL | Status: DC

## 2013-03-15 NOTE — Patient Instructions (Addendum)
You will be set up for an upper endoscopy (LEC, moderate sedation) for GERD, dyspepsia. Trial of bismuth tabs (two pills twice a day) for loose stools.

## 2013-03-15 NOTE — Progress Notes (Signed)
Review of pertinent gastrointestinal problems: 1. Routine risk for crc, colonoscopy 12/2010 found diverticulosis only, no polyps.  Recommended recall colonoscopy in 10 years.   HPI: This is a   very pleasant 52 year old man whom I last saw the time of the screening colonoscopy. See those results above.  He is here today for a different issue.  Burning in his stomach for a long.  Does not get burning in chest (only very rarely).    Has been on PPIs.  Currently on prevacid.  Still takes tums (3-5 tums per day while on prevacid).  Takes the prevacid in AM, 20-30 min prior to BF.  Takes tums PRN, will usually help.  He feels that better acid suppression seems to help his GERD.  Tylenol only for pain.  1 cup of coffee in AM, 2 sodas throughout the day.  None after 1pm.  Pill associated dysphagia at times.  Overall stable weight.  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  prilosec caused looser stools.    Review of systems: Pertinent positive and negative review of systems were noted in the above HPI section. Complete review of systems was performed and was otherwise normal.    Past Medical History  Diagnosis Date  . Chronic sinusitis   . Sleep deficient   . Diverticulosis of colon (without mention of hemorrhage)     Past Surgical History  Procedure Laterality Date  . Hernia repair      Current Outpatient Prescriptions  Medication Sig Dispense Refill  . desonide (DESONATE) 0.05 % gel Apply topically 2 (two) times daily.      . lansoprazole (PREVACID 24HR) 15 MG capsule Take 15 mg by mouth daily before breakfast.      . Testosterone 12.5 MG/ACT (1%) GEL Place 1 application onto the skin daily.      Marland Kitchen UNABLE TO FIND Med Name: Serenagen --- sleep aid       No current facility-administered medications for this visit.    Allergies as of 03/15/2013 - Review Complete 03/15/2013  Allergen Reaction Noted  . Other  12/06/2012    Family History  Problem Relation Age of Onset  .  Breast cancer Maternal Grandmother   . Colon cancer Neg Hx   . Colon polyps Brother   . Colon polyps Maternal Uncle     History   Social History  . Marital Status: Married    Spouse Name: N/A    Number of Children: 1  . Years of Education: N/A   Occupational History  . PILOT [employer]   Social History Main Topics  . Smoking status: Never Smoker   . Smokeless tobacco: Never Used  . Alcohol Use: Yes     Comment: 3 drinks a month  . Drug Use: No  . Sexually Active: Not on file   Other Topics Concern  . Not on file   Social History Narrative   Daily caffeine        Physical Exam: BP 110/70  Pulse 78  Ht 5\' 9"  (1.753 m)  Wt 162 lb (73.483 kg)  BMI 23.91 kg/m2 Constitutional: generally well-appearing Psychiatric: alert and oriented x3 Eyes: extraocular movements intact Mouth: oral pharynx moist, no lesions Neck: supple no lymphadenopathy Cardiovascular: heart regular rate and rhythm Lungs: clear to auscultation bilaterally Abdomen: soft, nontender, nondistended, no obvious ascites, no peritoneal signs, normal bowel sounds Extremities: no lower extremity edema bilaterally Skin: no lesions on visible extremities    Assessment and plan: 52 y.o. male with  chronic burning in stomach, likely GERD related.  He'll continue taking his Prevacid daily. I would like to proceed with upper endoscopy check for gastritis, H. pylori, peptic ulcer disease. He also mentioned loose stools since starting proton pump inhibitors and perhaps he has mild microscopic colitis. This is improving somewhat. I am hesitant to give him a steroid medicines since he battles insomnia. Rather I will prescribe him bismuth tabs, he'll take 2 tabs twice daily. I warned him that his stools looked darker.

## 2013-03-18 ENCOUNTER — Encounter: Payer: Self-pay | Admitting: Gastroenterology

## 2013-04-10 ENCOUNTER — Ambulatory Visit (AMBULATORY_SURGERY_CENTER): Payer: 59 | Admitting: Gastroenterology

## 2013-04-10 ENCOUNTER — Encounter: Payer: Self-pay | Admitting: Gastroenterology

## 2013-04-10 VITALS — BP 117/75 | HR 68 | Temp 97.3°F | Resp 26 | Ht 69.0 in | Wt 162.0 lb

## 2013-04-10 DIAGNOSIS — K2960 Other gastritis without bleeding: Secondary | ICD-10-CM

## 2013-04-10 DIAGNOSIS — K219 Gastro-esophageal reflux disease without esophagitis: Secondary | ICD-10-CM

## 2013-04-10 DIAGNOSIS — R1013 Epigastric pain: Secondary | ICD-10-CM

## 2013-04-10 DIAGNOSIS — K2970 Gastritis, unspecified, without bleeding: Secondary | ICD-10-CM

## 2013-04-10 DIAGNOSIS — K3189 Other diseases of stomach and duodenum: Secondary | ICD-10-CM

## 2013-04-10 MED ORDER — SODIUM CHLORIDE 0.9 % IV SOLN: 500.0000 mL | INTRAVENOUS | Status: DC

## 2013-04-10 NOTE — Progress Notes (Signed)
Patient did not experience any of the following events: a burn prior to discharge; a fall within the facility; wrong site/side/patient/procedure/implant event; or a hospital transfer or hospital admission upon discharge from the facility. (G8907) Patient did not have preoperative order for IV antibiotic SSI prophylaxis. (G8918)  

## 2013-04-10 NOTE — Patient Instructions (Addendum)
Discharge instructions given with verbal understanding. Biopsies taken. Resume previous medications. YOU HAD AN ENDOSCOPIC PROCEDURE TODAY AT THE [facility]: Refer to the procedure report that was given to you for any specific questions about what was found during the examination.  If the procedure report does not answer your questions, please call your gastroenterologist to clarify.  If you requested that your care partner not be given the details of your procedure findings, then the procedure report has been included in a sealed envelope for you to review at your convenience later.  YOU SHOULD EXPECT: Some feelings of bloating in the abdomen. Passage of more gas than usual.  Walking can help get rid of the air that was put into your GI tract during the procedure and reduce the bloating. If you had a lower endoscopy (such as a colonoscopy or flexible sigmoidoscopy) you may notice spotting of blood in your stool or on the toilet paper. If you underwent a bowel prep for your procedure, then you may not have a normal bowel movement for a few days.  DIET: Your first meal following the procedure should be a light meal and then it is ok to progress to your normal diet.  A half-sandwich or bowl of soup is an example of a good first meal.  Heavy or fried foods are harder to digest and may make you feel nauseous or bloated.  Likewise meals heavy in dairy and vegetables can cause extra gas to form and this can also increase the bloating.  Drink plenty of fluids but you should avoid alcoholic beverages for 24 hours.  ACTIVITY: Your care partner should take you home directly after the procedure.  You should plan to take it easy, moving slowly for the rest of the day.  You can resume normal activity the day after the procedure however you should NOT DRIVE or use heavy machinery for 24 hours (because of the sedation medicines used during the test).    SYMPTOMS TO REPORT IMMEDIATELY: A gastroenterologist  can be reached at any hour.  During normal business hours, 8:30 AM to 5:00 PM Monday through Friday, call [phone].  After hours and on weekends, please call the GI answering service at [phone] who will take a message and have the physician on call contact you.   Following upper endoscopy (EGD)  Vomiting of blood or coffee ground material  New chest pain or pain under the shoulder blades  Painful or persistently difficult swallowing  New shortness of breath  Fever of 100F or higher  Black, tarry-looking stools  FOLLOW UP: If any biopsies were taken you will be contacted by phone or by letter within the next 1-3 weeks.  Call your gastroenterologist if you have not heard about the biopsies in 3 weeks.  Our staff will call the home number listed on your records the next business day following your procedure to check on you and address any questions or concerns that you may have at that time regarding the information given to you following your procedure. This is a courtesy call and so if there is no answer at the home number and we have not heard from you through the emergency physician on call, we will assume that you have returned to your regular daily activities without incident.  SIGNATURES/CONFIDENTIALITY: You and/or your care partner have signed paperwork which will be entered into your electronic medical record.  These signatures attest to the fact that that the information above on your After   Visit Summary has been reviewed and is understood.  Full responsibility of the confidentiality of this discharge information lies with you and/or your care-partner. 

## 2013-04-10 NOTE — Op Note (Signed)
[facility] [address] [place], [address]   ENDOSCOPY PROCEDURE REPORT  PATIENT: Jay Brooks, Jay Brooks  MR#: [id number] BIRTHDATE: 08/25/1961 , 52  yrs. old GENDER: Male ENDOSCOPIST: Rachael Fee, MD REFERRED BY:  Lucky Cowboy, M.D. PROCEDURE DATE:  04/10/2013 PROCEDURE:  EGD w/ biopsy ASA CLASS:     Class II INDICATIONS:  chronic GERD, dyspepsia. MEDICATIONS: Fentanyl 50 mcg IV, Versed 5 mg IV, and These medications were titrated to patient response per physician's verbal order TOPICAL ANESTHETIC: Cetacaine Spray  DESCRIPTION OF PROCEDURE: After the risks benefits and alternatives of the procedure were thoroughly explained, informed consent was obtained.  The LB [id number] endoscope was introduced through the mouth and advanced to the second portion of the duodenum. Without limitations.  The instrument was slowly withdrawn as the mucosa was fully examined.     There was mild, non-specific distal gastritis.  This was biopsied and sent to pathology.  The examination was otherwise normal. Retroflexed views revealed no abnormalities.     The scope was then withdrawn from the patient and the procedure completed.  COMPLICATIONS: There were no complications.  ENDOSCOPIC IMPRESSION: There was mild, non-specific distal gastritis.  This was biopsied and sent to pathology. The examination was otherwise normal.  RECOMMENDATIONS: Continue once daily prevacid. Please try one pepcid or zantac pill, at bedtime every night.  This is very good at preventing overnight GERD issues. If biopsies show H.  pylori, you will be started on appropriate antibiotics.   eSigned:  Rachael Fee, MD 04/10/2013 3:46 PM

## 2013-04-11 ENCOUNTER — Telehealth: Payer: Self-pay | Admitting: *Deleted

## 2013-04-11 ENCOUNTER — Telehealth: Payer: Self-pay

## 2013-04-11 ENCOUNTER — Telehealth: Payer: Self-pay | Admitting: Gastroenterology

## 2013-04-11 NOTE — Telephone Encounter (Signed)
I spoke with Mr. Mielke, and he said that he was fine earlier, but now has some head fuzziness and odd feeling.  He has no pain; no slurring of speech; has eaten.   Patient states that he has normal blood pressure, and he's usually a healthy guy.  I told him to drink more fluids, and call us back if he was any more issues.  Patient did have fent and versed yesterday.  Note routed to Dr. Christella Hartigan for review.

## 2013-04-11 NOTE — Telephone Encounter (Signed)
Called patient at this time and he did not answer phone.  Left a message to call me when he got it.

## 2013-04-11 NOTE — Telephone Encounter (Signed)
  Follow up Call-  Call back number 04/10/2013  Post procedure Call Back phone  # [phone]  Permission to leave phone message Yes     Patient questions:  Do you have a fever, pain , or abdominal swelling? no Pain Score  0 *  Have you tolerated food without any problems? yes  Have you been able to return to your normal activities? yes  Do you have any questions about your discharge instructions: Diet   no Medications  no Follow up visit  no  Do you have questions or concerns about your Care? no  Actions: * If pain score is 4 or above: No action needed, pain <4.

## 2013-04-11 NOTE — Telephone Encounter (Signed)
Darlyn Read, RN called the patient and left a message to call us if he was still dizzy.   There was no answer.

## 2013-04-11 NOTE — Telephone Encounter (Signed)
Spoke with patient, and he stated that he felt much better.  He thanked [place] for being so concerned.

## 2013-04-11 NOTE — Telephone Encounter (Signed)
i agree.  Jay Brooks, can you please plan to call him around 3-4 today to see how he is feeling and let me know?

## 2013-04-12 NOTE — Telephone Encounter (Signed)
Great, thanks

## 2013-06-18 NOTE — Telephone Encounter (Signed)
See Previous Encounter

## 2013-10-24 ENCOUNTER — Encounter: Payer: Self-pay | Admitting: Physician Assistant

## 2013-10-24 ENCOUNTER — Ambulatory Visit (INDEPENDENT_AMBULATORY_CARE_PROVIDER_SITE_OTHER): Payer: 59 | Admitting: Physician Assistant

## 2013-10-24 VITALS — BP 122/70 | HR 60 | Temp 99.0°F | Resp 16 | Ht 69.5 in | Wt 161.0 lb

## 2013-10-24 DIAGNOSIS — M542 Cervicalgia: Secondary | ICD-10-CM

## 2013-10-24 DIAGNOSIS — J029 Acute pharyngitis, unspecified: Secondary | ICD-10-CM

## 2013-10-24 MED ORDER — PREDNISONE 20 MG PO TABS: ORAL_TABLET | ORAL | Status: DC

## 2013-10-24 MED ORDER — AZITHROMYCIN 250 MG PO TABS: 250.0000 mg | ORAL_TABLET | Freq: Every day | ORAL | Status: DC

## 2013-10-24 NOTE — Patient Instructions (Signed)

## 2013-10-24 NOTE — Progress Notes (Signed)
   Subjective:    Patient ID: Jay Brooks, male    DOB: 05-Jan-1961, 52 y.o.   MRN: [id number]  Sore Throat  This is a new problem. Episode onset: 5 days. The problem has been gradually worsening. There has been no fever. The pain is mild (worse in the morning). Associated symptoms include congestion, coughing and neck pain. Pertinent negatives include no abdominal pain, diarrhea, drooling, ear discharge, headaches, hoarse voice, plugged ear sensation, shortness of breath, stridor, swollen glands, trouble swallowing or vomiting.   Patient also has had neck pain since he was in his 21's, he sees a Land and states this helps. Denies numbness, tingling, and radicular pain.    Review of Systems  Constitutional: Negative.   HENT: Positive for congestion and sore throat. Negative for drooling, ear discharge, hoarse voice and trouble swallowing.   Respiratory: Positive for cough. Negative for shortness of breath and stridor.   Cardiovascular: Negative.   Gastrointestinal: Negative.  Negative for vomiting, abdominal pain and diarrhea.  Genitourinary: Negative.   Musculoskeletal: Positive for neck pain and neck stiffness. Negative for arthralgias, back pain, gait problem, joint swelling and myalgias.  Neurological: Negative.  Negative for headaches.       Objective:   Physical Exam  Constitutional: He appears well-developed and well-nourished.  HENT:  Head: Normocephalic and atraumatic.  Right Ear: External ear normal.  Left Ear: External ear normal.  Mouth/Throat: Uvula is midline and mucous membranes are normal. Posterior oropharyngeal edema and posterior oropharyngeal erythema present.  Eyes: Conjunctivae and EOM are normal. Pupils are equal, round, and reactive to light.  Neck: Normal range of motion. Neck supple.  Cardiovascular: Normal rate, regular rhythm and normal heart sounds.   Pulmonary/Chest: Effort normal. He has wheezes (mild).  Abdominal: Soft. Bowel sounds are  normal.  Musculoskeletal: He exhibits tenderness (bilateral traps).  Lymphadenopathy:    He has no cervical adenopathy.  Skin: Skin is warm and dry.      Assessment & Plan:  Acute pharyngitis - Plan: azithromycin (ZITHROMAX) 250 MG tablet, predniSONE (DELTASONE) 20 MG tablet  Neck pain - Plan: Ambulatory referral to Physical Therapy

## 2014-02-24 ENCOUNTER — Ambulatory Visit (INDEPENDENT_AMBULATORY_CARE_PROVIDER_SITE_OTHER): Payer: 59 | Admitting: Physician Assistant

## 2014-02-24 ENCOUNTER — Encounter: Payer: Self-pay | Admitting: Physician Assistant

## 2014-02-24 VITALS — BP 102/70 | HR 76 | Temp 97.9°F | Resp 16 | Ht 69.5 in | Wt 165.0 lb

## 2014-02-24 DIAGNOSIS — R1013 Epigastric pain: Secondary | ICD-10-CM

## 2014-02-24 DIAGNOSIS — K219 Gastro-esophageal reflux disease without esophagitis: Secondary | ICD-10-CM

## 2014-02-24 LAB — CBC WITH DIFFERENTIAL/PLATELET
Basophils Absolute: 0 10*3/uL (ref 0.0–0.1)
Basophils Relative: 0 % (ref 0–1)
Eosinophils Absolute: 0.1 10*3/uL (ref 0.0–0.7)
Eosinophils Relative: 1 % (ref 0–5)
HCT: 44.3 % (ref 39.0–52.0)
Hemoglobin: 15.4 g/dL (ref 13.0–17.0)
Lymphocytes Relative: 28 % (ref 12–46)
Lymphs Abs: 1.5 10*3/uL (ref 0.7–4.0)
MCH: 31.4 pg (ref 26.0–34.0)
MCHC: 34.8 g/dL (ref 30.0–36.0)
MCV: 90.4 fL (ref 78.0–100.0)
Monocytes Absolute: 0.8 10*3/uL (ref 0.1–1.0)
Monocytes Relative: 14 % — ABNORMAL HIGH (ref 3–12)
Neutro Abs: 3.1 10*3/uL (ref 1.7–7.7)
Neutrophils Relative %: 57 % (ref 43–77)
Platelets: 161 10*3/uL (ref 150–400)
RBC: 4.9 MIL/uL (ref 4.22–5.81)
RDW: 12.4 % (ref 11.5–15.5)
WBC: 5.5 10*3/uL (ref 4.0–10.5)

## 2014-02-24 LAB — HEPATIC FUNCTION PANEL
ALT: 21 U/L (ref 0–53)
AST: 29 U/L (ref 0–37)
Albumin: 4.3 g/dL (ref 3.5–5.2)
Alkaline Phosphatase: 55 U/L (ref 39–117)
Bilirubin, Direct: 0.1 mg/dL (ref 0.0–0.3)
Indirect Bilirubin: 0.4 mg/dL (ref 0.2–1.2)
Total Bilirubin: 0.5 mg/dL (ref 0.2–1.2)
Total Protein: 6.9 g/dL (ref 6.0–8.3)

## 2014-02-24 LAB — BASIC METABOLIC PANEL WITH GFR
BUN: 12 mg/dL (ref 6–23)
CO2: 29 mEq/L (ref 19–32)
Calcium: 9 mg/dL (ref 8.4–10.5)
Chloride: 102 mEq/L (ref 96–112)
Creat: 0.99 mg/dL (ref 0.50–1.35)
GFR, Est African American: >89 mL/min
GFR, Est Non African American: 85 mL/min
Glucose, Bld: 96 mg/dL (ref 70–99)
Potassium: 4 mEq/L (ref 3.5–5.3)
Sodium: 139 mEq/L (ref 135–145)

## 2014-02-24 LAB — AMYLASE: Amylase: 34 U/L (ref 0–105)

## 2014-02-24 MED ORDER — PANTOPRAZOLE SODIUM 40 MG PO TBEC: 40.0000 mg | DELAYED_RELEASE_TABLET | Freq: Every day | ORAL | Status: DC

## 2014-02-24 NOTE — Patient Instructions (Signed)

## 2014-02-24 NOTE — Progress Notes (Signed)
   Subjective:    Patient ID: Jay Brooks, male    DOB: 1961/03/01, 53 y.o.   MRN: [id number]  HPI 53 y.o. presents for abdominal pain. States it started Wednesday afternoon after eating dinner. He was having abdominal pain started in epigastric area as a burning took a tums which helped, happened intermittently for a day, he also describes 6 lb weight gain with abdominal distention/hardness, had more straining and several well formed bowel movements, felt a pressure.  Then moved down to his lower abdomen with a "rumbling"/cramping Saturday eveing, felt like he was going to have diarrhea but never did. Had decreased appetite. Took tums and pepto. Denies blood in stool, denies fever, chills diarrhea. Then earlier in the week he also had migratory myalgias. Denies sick contact, anyone else sick, and no new medication other than he has increased   Insomnia has been an ongoing issue. He has done sleep studies, failed ambien, and several other sleep medications.   Review of Systems  Constitutional: Positive for appetite change. Negative for fever, chills and fatigue.  HENT: Negative.   Respiratory: Negative.   Cardiovascular: Negative.   Gastrointestinal: Positive for nausea and abdominal pain. Negative for vomiting, diarrhea, constipation, blood in stool, abdominal distention, anal bleeding and rectal pain.  Genitourinary: Negative.   Musculoskeletal: Positive for myalgias.  Neurological: Negative.   Psychiatric/Behavioral: Positive for sleep disturbance.       Objective:   Physical Exam  Constitutional: He is oriented to person, place, and time. He appears well-developed and well-nourished.  HENT:  Head: Normocephalic and atraumatic.  Right Ear: External ear normal.  Left Ear: External ear normal.  Mouth/Throat: Oropharynx is clear and moist.  Eyes: Conjunctivae and EOM are normal. Pupils are equal, round, and reactive to light.  Neck: Normal range of motion. Neck supple.   Cardiovascular: Normal rate, regular rhythm and normal heart sounds.   Pulmonary/Chest: Effort normal and breath sounds normal.  Abdominal: Soft. Bowel sounds are normal. He exhibits no mass. There is tenderness. There is no rebound and no guarding.  Musculoskeletal: Normal range of motion.  Neurological: He is alert and oriented to person, place, and time. No cranial nerve deficit.  Skin: Skin is warm and dry.          Assessment & Plan:  Abdominal pain/myalgias- ? GERD- check CBC, BMP, LFTs, Mg, Hpylori, etc- he is doing better now, Nexium samples given Insomnia- Belsomra samples given

## 2014-02-26 LAB — HELICOBACTER PYLORI ABS-IGG+IGA, BLD
H Pylori IgG: <0.4 {ISR}
HELICOBACTER PYLORI AB, IGA: 1.4 U/mL (ref <9.0)

## 2014-05-22 ENCOUNTER — Encounter: Payer: Self-pay | Admitting: Emergency Medicine

## 2014-05-22 ENCOUNTER — Ambulatory Visit (INDEPENDENT_AMBULATORY_CARE_PROVIDER_SITE_OTHER): Payer: 59 | Admitting: Emergency Medicine

## 2014-05-22 VITALS — BP 124/82 | HR 84 | Temp 98.2°F | Resp 16 | Ht 69.5 in | Wt 166.0 lb

## 2014-05-22 DIAGNOSIS — R030 Elevated blood-pressure reading, without diagnosis of hypertension: Secondary | ICD-10-CM

## 2014-05-22 DIAGNOSIS — E291 Testicular hypofunction: Secondary | ICD-10-CM

## 2014-05-22 DIAGNOSIS — E559 Vitamin D deficiency, unspecified: Secondary | ICD-10-CM

## 2014-05-22 DIAGNOSIS — R252 Cramp and spasm: Secondary | ICD-10-CM

## 2014-05-22 LAB — BASIC METABOLIC PANEL WITH GFR
BUN: 16 mg/dL (ref 6–23)
CO2: 28 mEq/L (ref 19–32)
Calcium: 9 mg/dL (ref 8.4–10.5)
Chloride: 100 mEq/L (ref 96–112)
Creat: 1.01 mg/dL (ref 0.50–1.35)
GFR, Est African American: >89 mL/min
GFR, Est Non African American: 83 mL/min
Glucose, Bld: 89 mg/dL (ref 70–99)
Potassium: 4.1 mEq/L (ref 3.5–5.3)
Sodium: 136 mEq/L (ref 135–145)

## 2014-05-22 LAB — HEPATIC FUNCTION PANEL
ALT: 17 U/L (ref 0–53)
AST: 30 U/L (ref 0–37)
Albumin: 4.3 g/dL (ref 3.5–5.2)
Alkaline Phosphatase: 41 U/L (ref 39–117)
Bilirubin, Direct: 0.1 mg/dL (ref 0.0–0.3)
Indirect Bilirubin: 0.7 mg/dL (ref 0.2–1.2)
Total Bilirubin: 0.8 mg/dL (ref 0.2–1.2)
Total Protein: 6.9 g/dL (ref 6.0–8.3)

## 2014-05-22 LAB — VITAMIN B12: Vitamin B-12: 382 pg/mL (ref 211–911)

## 2014-05-22 LAB — CBC WITH DIFFERENTIAL/PLATELET
Basophils Absolute: 0 10*3/uL (ref 0.0–0.1)
Basophils Relative: 0 % (ref 0–1)
Eosinophils Absolute: 0.1 10*3/uL (ref 0.0–0.7)
Eosinophils Relative: 1 % (ref 0–5)
HCT: 45.5 % (ref 39.0–52.0)
Hemoglobin: 16.2 g/dL (ref 13.0–17.0)
Lymphocytes Relative: 19 % (ref 12–46)
Lymphs Abs: 1.2 10*3/uL (ref 0.7–4.0)
MCH: 31.7 pg (ref 26.0–34.0)
MCHC: 35.6 g/dL (ref 30.0–36.0)
MCV: 89 fL (ref 78.0–100.0)
Monocytes Absolute: 0.7 10*3/uL (ref 0.1–1.0)
Monocytes Relative: 11 % (ref 3–12)
Neutro Abs: 4.2 10*3/uL (ref 1.7–7.7)
Neutrophils Relative %: 69 % (ref 43–77)
Platelets: 166 10*3/uL (ref 150–400)
RBC: 5.11 MIL/uL (ref 4.22–5.81)
RDW: 13.4 % (ref 11.5–15.5)
WBC: 6.1 10*3/uL (ref 4.0–10.5)

## 2014-05-22 LAB — TSH: TSH: 2.197 u[IU]/mL (ref 0.350–4.500)

## 2014-05-22 LAB — IRON AND TIBC
%SAT: 21 % (ref 20–55)
Iron: 62 ug/dL (ref 42–165)
TIBC: 302 ug/dL (ref 215–435)
UIBC: 240 ug/dL (ref 125–400)

## 2014-05-22 LAB — MAGNESIUM: Magnesium: 1.8 mg/dL (ref 1.5–2.5)

## 2014-05-22 LAB — CK: Total CK: 775 U/L — ABNORMAL HIGH (ref 7–232)

## 2014-05-22 NOTE — Patient Instructions (Signed)

## 2014-05-22 NOTE — Progress Notes (Signed)
Subjective:    Patient ID: Jay Brooks, male    DOB: 17-Jul-1961, 53 y.o.   MRN: [id number]  HPI Comments: 53 yo WM with unusual concerns. Patient presents with concerns about "cramping in muscles"  in chest, shoulders, radiates down back.  Positive painful and feels like muscles are "burning". He notes fluctuating BP readings, 120-190s. He also noted left > right BP consistent.  He denies any triggers and denies any new exposure since 2 months worth of symptoms. He started testosterone 3 weeks ago and has had improved energy. He has been getting injections in arm per office Dr Roena Malady. He had flight CPE yesterday with [facility] EKG. He notes at worse 4/10. He notes symptoms are on/off and denies triggers or alleviating factors. He denies any other CV symptoms.  He notes he has been on "adrenal herbals" Per DR Tye Savoy in [place] for over 1 year with improvement with fatigue.    Medication List       This list is accurate as of: 05/22/14 11:31 AM.  Always use your most recent med list.               desonide 0.05 % gel  Commonly known as:  DESONATE  Apply topically 2 (two) times daily.     PREVACID 24HR 15 MG capsule  Generic drug:  lansoprazole  Take 15 mg by mouth daily before breakfast.     testosterone cypionate 200 MG/ML injection  Commonly known as:  DEPOTESTOTERONE CYPIONATE  Inject 100 mg into the muscle once a week.     UNABLE TO FIND  Med Name: Serenagen --- sleep aid       Allergies  Allergen Reactions  . Other     Benzol Peroxide- caused blisters   Past Medical History  Diagnosis Date  . Chronic sinusitis   . Sleep deficient   . Diverticulosis of colon (without mention of hemorrhage)       Review of Systems  Musculoskeletal: Positive for myalgias.  All other systems reviewed and are negative.  BP 124/82  Pulse 84  Temp(Src) 98.2 F (36.8 C) (Temporal)  Resp 16  Ht 5' 9.5" (1.765 m)  Wt 166 lb (75.297 kg)  BMI 24.17 kg/m2 Right ARM 119/80     Objective:   Physical Exam  Nursing note and vitals reviewed. Constitutional: He is oriented to person, place, and time. He appears well-developed and well-nourished.  HENT:  Head: Normocephalic and atraumatic.  Right Ear: External ear normal.  Left Ear: External ear normal.  Nose: Nose normal.  Eyes: Conjunctivae and EOM are normal.  Neck: Normal range of motion. Neck supple. No JVD present. No thyromegaly present.  Cardiovascular: Normal rate, regular rhythm, normal heart sounds and intact distal pulses.   Pulmonary/Chest: Effort normal and breath sounds normal.  Abdominal: Soft. Bowel sounds are normal. He exhibits no distension. There is no tenderness. There is no rebound.  Musculoskeletal: Normal range of motion. He exhibits no edema and no tenderness.  Lymphadenopathy:    He has no cervical adenopathy.  Neurological: He is alert and oriented to person, place, and time. He has normal reflexes. No cranial nerve deficit. Coordination normal.  Skin: Skin is warm and dry.  Psychiatric: He has a normal mood and affect. His behavior is normal. Judgment and thought content normal.          Assessment & Plan:  1. Muscle cramping- Check labs if all negative may need referral to muscle specialist at [facility]  vs d/c "adrenal herbals"  2. ? BP changes with NEG exam- COntinue to monitor with NEW CUFF, w/c if SX increase or ER. Patient declines EKG today with NEG yesterday at [place]

## 2014-05-23 LAB — TESTOSTERONE: Testosterone: 797 ng/dL (ref 300–890)

## 2014-05-23 LAB — FOLATE RBC: RBC Folate: 237 ng/mL — ABNORMAL LOW (ref >280)

## 2014-05-23 LAB — CORTISOL: Cortisol, Plasma: 10.2 ug/dL

## 2014-05-23 LAB — SEDIMENTATION RATE: Sed Rate: 1 mm/hr (ref 0–16)

## 2014-05-23 LAB — VITAMIN D 25 HYDROXY (VIT D DEFICIENCY, FRACTURES): Vit D, 25-Hydroxy: 71 ng/mL (ref 30–89)

## 2014-05-24 LAB — ALDOLASE: Aldolase: 8.5 U/L — ABNORMAL HIGH (ref <=8.1)

## 2014-06-09 ENCOUNTER — Other Ambulatory Visit: Payer: Self-pay | Admitting: Internal Medicine

## 2014-06-09 ENCOUNTER — Other Ambulatory Visit: Payer: Self-pay

## 2014-06-09 DIAGNOSIS — IMO0001 Reserved for inherently not codable concepts without codable children: Secondary | ICD-10-CM

## 2014-06-09 LAB — SEDIMENTATION RATE: Sed Rate: 1 mm/hr (ref 0–16)

## 2014-06-09 LAB — CK: Total CK: 220 U/L (ref 7–232)

## 2014-06-12 ENCOUNTER — Ambulatory Visit (INDEPENDENT_AMBULATORY_CARE_PROVIDER_SITE_OTHER): Payer: 59 | Admitting: *Deleted

## 2014-06-12 DIAGNOSIS — E291 Testicular hypofunction: Secondary | ICD-10-CM

## 2014-06-12 LAB — ALDOLASE: Aldolase: 7.2 U/L (ref <=8.1)

## 2014-06-12 MED ORDER — TESTOSTERONE CYPIONATE 200 MG/ML IM SOLN
100.0000 mg | Freq: Once | INTRAMUSCULAR | Status: AC
  Administered 2014-06-12: 100 mg via INTRAMUSCULAR

## 2014-06-12 NOTE — Progress Notes (Signed)
Patient ID: Jay Brooks, male   DOB: 1961-10-14, 53 y.o.   MRN: [id number] Patient presents for testosterone injection for hypogonadism tx.

## 2014-06-18 ENCOUNTER — Encounter: Payer: Self-pay | Admitting: Gastroenterology

## 2014-06-20 ENCOUNTER — Ambulatory Visit (INDEPENDENT_AMBULATORY_CARE_PROVIDER_SITE_OTHER): Payer: 59 | Admitting: *Deleted

## 2014-06-20 DIAGNOSIS — E291 Testicular hypofunction: Secondary | ICD-10-CM

## 2014-06-20 MED ORDER — TESTOSTERONE CYPIONATE 200 MG/ML IM SOLN
100.0000 mg | Freq: Once | INTRAMUSCULAR | Status: AC
  Administered 2014-06-20: 100 mg via INTRAMUSCULAR

## 2014-06-20 NOTE — Progress Notes (Signed)
Patient ID: Jay Brooks, male   DOB: [birth year], 53 y.o.   MRN: [id number] Patient presents for testosterone injection for hypogonadism Tx.  Patient received 0.5 cc IM right glut.  Patient tolerated well.

## 2014-07-03 ENCOUNTER — Ambulatory Visit (INDEPENDENT_AMBULATORY_CARE_PROVIDER_SITE_OTHER): Payer: 59 | Admitting: *Deleted

## 2014-07-03 DIAGNOSIS — E291 Testicular hypofunction: Secondary | ICD-10-CM

## 2014-07-03 MED ORDER — TESTOSTERONE CYPIONATE 200 MG/ML IM SOLN
100.0000 mg | Freq: Once | INTRAMUSCULAR | Status: AC
  Administered 2014-07-03: 100 mg via INTRAMUSCULAR

## 2014-07-03 NOTE — Progress Notes (Signed)
Patient ID: Jay Brooks, male   DOB: 01-Feb-1961, 53 y.o.   MRN: [id number] Patient presents for testosterone injection for hypogonadism Tx.  Patient received 0.5 cc IM right glut.  Patient tolerated well.

## 2014-07-15 ENCOUNTER — Ambulatory Visit (INDEPENDENT_AMBULATORY_CARE_PROVIDER_SITE_OTHER): Payer: 59 | Admitting: *Deleted

## 2014-07-15 DIAGNOSIS — E291 Testicular hypofunction: Secondary | ICD-10-CM

## 2014-07-15 MED ORDER — TESTOSTERONE CYPIONATE 200 MG/ML IM SOLN
100.0000 mg | Freq: Once | INTRAMUSCULAR | Status: AC
  Administered 2014-07-15: 100 mg via INTRAMUSCULAR

## 2014-07-15 NOTE — Progress Notes (Signed)
Patient ID: JACOBE STUDY, male   DOB: [birth year], 53 y.o.   MRN: [id number] Patient presents for testosterone injection for hypogonadism.  Patient received 0.5 cc IM left glut.

## 2014-08-04 ENCOUNTER — Encounter: Payer: Self-pay | Admitting: Physician Assistant

## 2014-08-04 ENCOUNTER — Other Ambulatory Visit: Payer: Self-pay | Admitting: Physician Assistant

## 2014-08-04 MED ORDER — PREDNISONE 20 MG PO TABS: ORAL_TABLET | ORAL | Status: DC

## 2014-08-04 MED ORDER — AZITHROMYCIN 250 MG PO TABS: ORAL_TABLET | ORAL | Status: AC

## 2014-08-20 ENCOUNTER — Encounter: Payer: Self-pay | Admitting: Physician Assistant

## 2014-08-20 ENCOUNTER — Ambulatory Visit (INDEPENDENT_AMBULATORY_CARE_PROVIDER_SITE_OTHER): Payer: 59 | Admitting: Physician Assistant

## 2014-08-20 ENCOUNTER — Telehealth: Payer: Self-pay | Admitting: Internal Medicine

## 2014-08-20 VITALS — BP 128/80 | HR 76 | Temp 98.2°F | Resp 16 | Ht 69.5 in | Wt 164.0 lb

## 2014-08-20 DIAGNOSIS — R05 Cough: Secondary | ICD-10-CM

## 2014-08-20 DIAGNOSIS — J0100 Acute maxillary sinusitis, unspecified: Secondary | ICD-10-CM

## 2014-08-20 DIAGNOSIS — R059 Cough, unspecified: Secondary | ICD-10-CM

## 2014-08-20 DIAGNOSIS — E291 Testicular hypofunction: Secondary | ICD-10-CM

## 2014-08-20 MED ORDER — TESTOSTERONE CYPIONATE 200 MG/ML IM SOLN
300.0000 mg | Freq: Once | INTRAMUSCULAR | Status: AC
  Administered 2014-08-20: 300 mg via INTRAMUSCULAR

## 2014-08-20 MED ORDER — IPRATROPIUM-ALBUTEROL 0.5-2.5 (3) MG/3ML IN SOLN
3.0000 mL | Freq: Once | RESPIRATORY_TRACT | Status: AC
  Administered 2014-08-20: 3 mL via RESPIRATORY_TRACT

## 2014-08-20 MED ORDER — ALBUTEROL SULFATE HFA 108 (90 BASE) MCG/ACT IN AERS: 1.0000 | INHALATION_SPRAY | Freq: Four times a day (QID) | RESPIRATORY_TRACT | Status: DC | PRN

## 2014-08-20 NOTE — Telephone Encounter (Signed)
Patient was seen today,  called to see if you called in an inhaler for him to [facility] [place]?   Thank you, Katrina Judeth Horn [facility] [phone] Fax [phone]

## 2014-08-20 NOTE — Progress Notes (Signed)
   Subjective:    Patient ID: Jay Brooks, male    DOB: Jan 26, 1961, 53 y.o.   MRN: [id number]  HPI 53 y.o. male with presents with cough for 08/04/14. He was given Zpak and prednisone on 09/28 for URI, he completed ABX and states he is feeling better but has lingering cough. Denies fever, chills, mucus, but continues to have dry, wheezing cough. Worse talking/laughing, has continuous tickle in his throat, it is not affecting his sleep, not worse at night/morning/food. He is on prevacid for GERD.    Review of Systems  Constitutional: Negative for fever, chills and diaphoresis.  HENT: Positive for postnasal drip. Negative for congestion, ear pain, rhinorrhea, sinus pressure, sneezing, sore throat, trouble swallowing and voice change.   Eyes: Negative.   Respiratory: Positive for cough and wheezing. Negative for chest tightness and shortness of breath.   Cardiovascular: Negative.   Gastrointestinal: Negative.   Genitourinary: Negative.   Musculoskeletal: Negative.  Negative for neck pain.  Neurological: Negative.  Negative for headaches.       Objective:   Physical Exam  Constitutional: He is oriented to person, place, and time. He appears well-developed and well-nourished.  HENT:  Head: Normocephalic and atraumatic.  Right Ear: External ear normal.  Left Ear: External ear normal.  Nose: Nose normal.  Mouth/Throat: Oropharynx is clear and moist.  Eyes: Conjunctivae are normal. Pupils are equal, round, and reactive to light.  Neck: Normal range of motion. Neck supple.  Cardiovascular: Normal rate, regular rhythm and normal heart sounds.   No murmur heard. Pulmonary/Chest: Effort normal. No respiratory distress. He has wheezes. He has no rales. He exhibits no tenderness.  Abdominal: Soft. Bowel sounds are normal.  Lymphadenopathy:    He has no cervical adenopathy.  Neurological: He is alert and oriented to person, place, and time.  Skin: Skin is warm and dry.       Assessment  & Plan:  Cough: Possible nocturnal GERD- will do samples of nexium once daily for 5 days PND- unable to tolerate allergy pills and start fluticasone nasal spray, 2 sprays each nostril twice a day  Work hard to stop throat clearing.  Plan a time when you will able to practice complete voice rest  Use sugar free candy to ease cough and throat irritation.

## 2014-08-20 NOTE — Patient Instructions (Signed)
will do samples of nexium once daily for 5 days unable to tolerate allergy pills and start fluticasone nasal spray, 2 sprays each nostril twice a day  Work hard to stop throat clearing.  Plan a time when you will able to practice complete voice rest  Use sugar free candy to ease cough and throat irritation.

## 2014-08-25 ENCOUNTER — Other Ambulatory Visit: Payer: Self-pay | Admitting: Physician Assistant

## 2014-08-25 ENCOUNTER — Telehealth: Payer: Self-pay | Admitting: *Deleted

## 2014-08-25 DIAGNOSIS — R05 Cough: Secondary | ICD-10-CM

## 2014-08-25 DIAGNOSIS — R059 Cough, unspecified: Secondary | ICD-10-CM

## 2014-08-25 NOTE — Telephone Encounter (Signed)
Patient called with c/o fever 101.4* F, body aches, diarrhea times 1 day.  Per Dr. Idell Pickles orders, I called patient and advised otc Immodium up to 12 per day and Rx for Levsin 0.125 mg.  Patient states "no longer having diarrhea symptms" so declines Immodium and Levsin.  States currently fever intermittent all day.  I asked if patient has taken any Tylenol to relieve fever symptoms. Patient states no, he has not taken anything to relieve symptoms.  Advised patient he will need to take some Tylenol to relieve fever or go to Urgent care for further eval and treatment.  Also advised patient an order for a chest xray was in the computer for him to go to [place] for further eval of c/o chest congestion and cough.

## 2014-08-26 ENCOUNTER — Ambulatory Visit
Admission: RE | Admit: 2014-08-26 | Discharge: 2014-08-26 | Disposition: A | Discharge status: Home / Self Care | Payer: 59 | Source: Ambulatory Visit | Attending: Physician Assistant | Admitting: Physician Assistant

## 2014-08-26 DIAGNOSIS — R059 Cough, unspecified: Secondary | ICD-10-CM

## 2014-08-26 DIAGNOSIS — R05 Cough: Secondary | ICD-10-CM

## 2014-08-29 ENCOUNTER — Ambulatory Visit (INDEPENDENT_AMBULATORY_CARE_PROVIDER_SITE_OTHER): Payer: 59 | Admitting: Internal Medicine

## 2014-08-29 ENCOUNTER — Encounter: Payer: Self-pay | Admitting: Internal Medicine

## 2014-08-29 VITALS — BP 106/66 | HR 64 | Temp 97.7°F | Resp 18 | Ht 69.5 in | Wt 166.8 lb

## 2014-08-29 DIAGNOSIS — R1030 Lower abdominal pain, unspecified: Secondary | ICD-10-CM

## 2014-08-29 DIAGNOSIS — J329 Chronic sinusitis, unspecified: Secondary | ICD-10-CM

## 2014-08-29 DIAGNOSIS — J0410 Acute tracheitis without obstruction: Secondary | ICD-10-CM

## 2014-08-29 MED ORDER — PREDNISONE 20 MG PO TABS: ORAL_TABLET | ORAL | Status: DC

## 2014-08-29 MED ORDER — HYDROCODONE-ACETAMINOPHEN 5-325 MG PO TABS: ORAL_TABLET | ORAL | Status: DC

## 2014-08-29 MED ORDER — LEVOFLOXACIN 500 MG PO TABS: 500.0000 mg | ORAL_TABLET | Freq: Every day | ORAL | Status: DC

## 2014-08-29 NOTE — Progress Notes (Signed)
   Subjective:    Patient ID: Jay Brooks, male    DOB: [birth year], 53 y.o.   MRN: [id number]  HPI Very nice patient with hx/o recurrent presents with 3 week prodrome of ST, dry cough and sinus post nasal drainage. No fever, chills sweats or sputum production. He also reports a3 day hx/o bilateral & midline abdominal discomfort exacerbated with coughing. No associated N/V, reflux or waterbrash, or D/C.   Medication List   albuterol 108 (90 BASE) MCG/ACT inhaler  Commonly known as:  PROVENTIL HFA;VENTOLIN HFA  Inhale 1-2 puffs into the lungs every 6 (six) hours as needed for wheezing or shortness of breath.     atovaquone-proguanil 250-100 MG Tabs  Commonly known as:  MALARONE  Take 1 tablet by mouth daily. As needed for work     desonide 0.05 % gel  Commonly known as:  DESONATE  Apply topically 2 (two) times daily.     HYDROcodone-acetaminophen 5-325 MG per tablet  Commonly known as:  NORCO  Take 1/2 to 1 Tablet every 3 to 4 hours if needed for cough or pain     levofloxacin 500 MG tablet   --- New today   Commonly known as:  LEVAQUIN  Take 1 tablet (500 mg total) by mouth daily.     predniSONE 20 MG tablet  --- New today  Commonly known as:  DELTASONE  1 tab 3 x day for 3 days, then 1 tab 2 x day for 3 days, then 1 tab 1 x day for 5 days     PREVACID 24HR 15 MG capsule  Generic drug:  lansoprazole  Take 15 mg by mouth daily before breakfast.     testosterone cypionate 200 MG/ML injection  Commonly known as:  DEPOTESTOTERONE CYPIONATE  Inject 200 mg into the muscle every 14 (fourteen) days. 2 cc q 2 weeks     Allergies  Allergen Reactions  . Other     Benzol Peroxide- caused blisters   Past Medical History  Diagnosis Date  . Chronic sinusitis   . Sleep deficient   . Diverticulosis of colon (without mention of hemorrhage)    Past Surgical History  Procedure Laterality Date  . Hernia repair     Review of Systems Negative except as above with 10 pt system  review.      Objective:   Physical Exam BP 106/66  P 64  T 97.7 F   R 18  Ht 5' 9.5"   Wt 166 lb 12.8 oz   BMI 24.29 kg/m2 In No Distress. (+) Cry Cough HEENT - Eac's patent. TM's Nl. EOM's full. PERRLA. Sl Maxillary tenderness. NasoOroPharynx 1(+) injected w/o exudates.. Neck - supple. Nl Thyroid. Carotids 2+ & No bruits, nodes, JVD Chest - Equal BS w/few scattered dry Rales, No rhonchi or wheezes. Cor - Nl HS. RRR w/o sig MGR. PP 1(+). No edema. Abd - No palpable organomegaly, masses or tenderness. BS nl. MS- FROM w/o deformities. Muscle power, tone and bulk Nl. Gait Nl. Neuro - No obvious Cr N abnormalities. Sensory, motor and Cerebellar functions appear Nl w/o focal abnormalities. Psyche - Mental status normal & appropriate.    Assessment & Plan:   1. Sinusitis  - Levaquin, Prednisone pulse/taper & Norco for cough & pain  2. Tracheitis  Levaquin, Prednisone, & Norco as above  3. Lower abdominal pain  - suspect due to zealous coughing and musclestrain

## 2014-09-01 ENCOUNTER — Ambulatory Visit: Payer: Self-pay | Admitting: Internal Medicine

## 2014-09-08 ENCOUNTER — Ambulatory Visit (INDEPENDENT_AMBULATORY_CARE_PROVIDER_SITE_OTHER): Payer: 59 | Admitting: Physician Assistant

## 2014-09-08 ENCOUNTER — Encounter: Payer: Self-pay | Admitting: Internal Medicine

## 2014-09-08 ENCOUNTER — Encounter: Payer: Self-pay | Admitting: Physician Assistant

## 2014-09-08 VITALS — BP 124/80 | HR 92 | Temp 98.0°F | Resp 16 | Ht 69.5 in | Wt 164.0 lb

## 2014-09-08 DIAGNOSIS — K219 Gastro-esophageal reflux disease without esophagitis: Secondary | ICD-10-CM

## 2014-09-08 DIAGNOSIS — E291 Testicular hypofunction: Secondary | ICD-10-CM

## 2014-09-08 DIAGNOSIS — J310 Chronic rhinitis: Secondary | ICD-10-CM

## 2014-09-08 DIAGNOSIS — J0100 Acute maxillary sinusitis, unspecified: Secondary | ICD-10-CM

## 2014-09-08 MED ORDER — TESTOSTERONE CYPIONATE 200 MG/ML IM SOLN: 200.0000 mg | INTRAMUSCULAR | Status: DC

## 2014-09-08 MED ORDER — TESTOSTERONE CYPIONATE 200 MG/ML IM SOLN
300.0000 mg | Freq: Once | INTRAMUSCULAR | Status: AC
  Administered 2014-09-08: 300 mg via INTRAMUSCULAR

## 2014-09-08 MED ORDER — ESOMEPRAZOLE MAGNESIUM 40 MG PO CPDR: 40.0000 mg | DELAYED_RELEASE_CAPSULE | Freq: Every day | ORAL | Status: DC

## 2014-09-08 NOTE — Patient Instructions (Signed)
-Take Nexium for 5 days and see if this helps instead of Prilosec. -Drink plenty of fluids to stay hydrated. -Use sugar-free candy for cough.  Sinusitis Sinusitis is redness, soreness, and inflammation of the paranasal sinuses. Paranasal sinuses are air pockets within the bones of your face (beneath the eyes, the middle of the forehead, or above the eyes). In healthy paranasal sinuses, mucus is able to drain out, and air is able to circulate through them by way of your nose. However, when your paranasal sinuses are inflamed, mucus and air can become trapped. This can allow bacteria and other germs to grow and cause infection. Sinusitis can develop quickly and last only a short time (acute) or continue over a long period (chronic). Sinusitis that lasts for more than 12 weeks is considered chronic.  CAUSES  Causes of sinusitis include:  Allergies.  Structural abnormalities, such as displacement of the cartilage that separates your nostrils (deviated septum), which can decrease the air flow through your nose and sinuses and affect sinus drainage.  Functional abnormalities, such as when the small hairs (cilia) that line your sinuses and help remove mucus do not work properly or are not present. SIGNS AND SYMPTOMS  Symptoms of acute and chronic sinusitis are the same. The primary symptoms are pain and pressure around the affected sinuses. Other symptoms include:  Upper toothache.  Earache.  Headache.  Bad breath.  Decreased sense of smell and taste.  A cough, which worsens when you are lying flat.  Fatigue.  Fever.  Thick drainage from your nose, which often is green and may contain pus (purulent).  Swelling and warmth over the affected sinuses. DIAGNOSIS  Your health care provider will perform a physical exam. During the exam, your health care provider may:  Look in your nose for signs of abnormal growths in your nostrils (nasal polyps).  Tap over the affected sinus to check for  signs of infection.  View the inside of your sinuses (endoscopy) using an imaging device that has a light attached (endoscope). If your health care provider suspects that you have chronic sinusitis, one or more of the following tests may be recommended:  Allergy tests.  Nasal culture. A sample of mucus is taken from your nose, sent to a lab, and screened for bacteria.  Nasal cytology. A sample of mucus is taken from your nose and examined by your health care provider to determine if your sinusitis is related to an allergy. TREATMENT  Most cases of acute sinusitis are related to a viral infection and will resolve on their own within 10 days. Sometimes medicines are prescribed to help relieve symptoms (pain medicine, decongestants, nasal steroid sprays, or saline sprays).  However, for sinusitis related to a bacterial infection, your health care provider will prescribe antibiotic medicines. These are medicines that will help kill the bacteria causing the infection.  Rarely, sinusitis is caused by a fungal infection. In theses cases, your health care provider will prescribe antifungal medicine. For some cases of chronic sinusitis, surgery is needed. Generally, these are cases in which sinusitis recurs more than 3 times per year, despite other treatments. HOME CARE INSTRUCTIONS   Drink plenty of water. Water helps thin the mucus so your sinuses can drain more easily.  Use a humidifier.  Inhale steam 3 to 4 times a day (for example, sit in the bathroom with the shower running).  Apply a warm, moist washcloth to your face 3 to 4 times a day, or as directed by your health  care provider.  Use saline nasal sprays to help moisten and clean your sinuses.  Take medicines only as directed by your health care provider.  If you were prescribed either an antibiotic or antifungal medicine, finish it all even if you start to feel better. SEEK IMMEDIATE MEDICAL CARE IF:  You have increasing pain or  severe headaches.  You have nausea, vomiting, or drowsiness.  You have swelling around your face.  You have vision problems.  You have a stiff neck.  You have difficulty breathing. MAKE SURE YOU:   Understand these instructions.  Will watch your condition.  Will get help right away if you are not doing well or get worse. Document Released: 10/24/2005 Document Revised: 03/10/2014 Document Reviewed: 11/08/2011 [facility] Patient Information 2015 [place], [place]. This information is not intended to replace advice given to you by your health care provider. Make sure you discuss any questions you have with your health care provider.

## 2014-09-08 NOTE — Progress Notes (Signed)
Subjective:    Patient ID: Jay Brooks, male    DOB: 15-Jul-1961, 53 y.o.   MRN: [id number]  CC: Cough/Nausea and Vomiting  Emesis  This is a new problem. Episode onset: Started around 08/04/14.  Patient recieved a Z-Pak and was seen on 08/20/14.  On 08/20/14 was given Nexium and Flonase to help sinusitis.  then patient was not getting better and was seen on 08/29/14 by Dr. Oneta Rack.   The problem has been gradually improving (Dr. Oneta Rack put on Levaquin, prednisone and Norco.  Patient states he finished the prednisone and hardly took the Norco.  He believes the Levaquin is giving him the nausea and vomiting.). Emesis appearance: Denies coffee ground appearance and states there was some blood streaks in vomit. There has been no fever. Associated symptoms include coughing. Pertinent negatives include no abdominal pain, arthralgias, chest pain, chills, diarrhea, dizziness, fever, headaches, myalgias, sweats, URI or weight loss. Associated symptoms comments: Patient states he had nausea and vomiting while on Levaquin.  He states he tried to take levaquin again with food this morning and threw up 20-30 minutes after taking the medication.  He denies diarrhea and constipation.  He denies bright red blood in stool and melena.  Denies chest pain, SOB, and states he has acid reflux and is taking Prevacid.  He saw a GI doctor a few years ago and they put him on prevacid.   . Improvement on treatment: Patient states he is feeling better except for cough and nausea and vomiting.   Says he never got the nexium from [place] on visit 08/20/14. Last testosterone shot on 08/20/14. Review of Systems  Constitutional: Negative.  Negative for fever, chills, weight loss, diaphoresis and fatigue.  HENT: Positive for postnasal drip and sinus pressure. Negative for congestion, dental problem, ear discharge, ear pain, facial swelling, rhinorrhea, sore throat, tinnitus and trouble swallowing.   Eyes: Negative.    Respiratory: Positive for cough. Negative for chest tightness, shortness of breath, wheezing and stridor.        Cough from postnasal drip.  Cardiovascular: Negative.  Negative for chest pain, palpitations and leg swelling.  Gastrointestinal: Positive for nausea and vomiting. Negative for abdominal pain, diarrhea, constipation, blood in stool and abdominal distention.  Endocrine: Negative.   Genitourinary: Negative.   Musculoskeletal: Negative.  Negative for myalgias and arthralgias.  Skin: Negative.  Negative for rash.  Neurological: Negative.  Negative for dizziness, speech difficulty, weakness, light-headedness and headaches.  Psychiatric/Behavioral: Negative.    Past Medical History  Diagnosis Date  . Chronic sinusitis   . Sleep deficient   . Diverticulosis of colon (without mention of hemorrhage)    Current Outpatient Prescriptions on File Prior to Visit  Medication Sig Dispense Refill  . albuterol (PROVENTIL HFA;VENTOLIN HFA) 108 (90 BASE) MCG/ACT inhaler Inhale 1-2 puffs into the lungs every 6 (six) hours as needed for wheezing or shortness of breath. 18 g 2  . atovaquone-proguanil (MALARONE) 250-100 MG TABS Take 1 tablet by mouth daily. As needed for work    . desonide (DESONATE) 0.05 % gel Apply topically 2 (two) times daily.    Marland Kitchen HYDROcodone-acetaminophen (NORCO) 5-325 MG per tablet Take 1/2 to 1 Tablet every 3 to 4 hours if needed for cough or pain 50 tablet 0  . lansoprazole (PREVACID 24HR) 15 MG capsule Take 15 mg by mouth daily before breakfast.    . testosterone cypionate (DEPOTESTOTERONE CYPIONATE) 200 MG/ML injection Inject 200 mg into the muscle every 14 (fourteen) days. 2  cc q 2 weeks     No current facility-administered medications on file prior to visit.   Allergies  Allergen Reactions  . Levaquin [Levofloxacin] Nausea And Vomiting    Patient states that after he took levaquin this past time he had nausea with vomiting  . Other     Benzol Peroxide- caused  blisters     BP 124/80 mmHg  Pulse 92  Temp(Src) 98 F (36.7 C) (Temporal)  Resp 16  Ht 5' 9.5" (1.765 m)  Wt 164 lb (74.39 kg)  BMI 23.88 kg/m2  SpO2 98%  Objective:   Physical Exam  Constitutional: He is oriented to person, place, and time. Vital signs are normal. He appears well-developed and well-nourished. He does not have a sickly appearance. He does not appear ill. No distress.  HENT:  Head: Normocephalic.  Right Ear: Tympanic membrane, external ear and ear canal normal. No drainage, swelling or tenderness. Tympanic membrane is not injected, not scarred, not perforated, not erythematous, not retracted and not bulging. No middle ear effusion.  Left Ear: Tympanic membrane, external ear and ear canal normal. No drainage, swelling or tenderness. Tympanic membrane is not injected, not scarred, not perforated, not erythematous, not retracted and not bulging.  No middle ear effusion.  Nose: Rhinorrhea present. No mucosal edema. Right sinus exhibits frontal sinus tenderness. Right sinus exhibits no maxillary sinus tenderness. Left sinus exhibits frontal sinus tenderness. Left sinus exhibits no maxillary sinus tenderness.  Mouth/Throat: Uvula is midline and mucous membranes are normal. Mucous membranes are not pale and not dry. No trismus in the jaw. No uvula swelling. Posterior oropharyngeal erythema present. No oropharyngeal exudate, posterior oropharyngeal edema or tonsillar abscesses.  Turbinates were erythematous bilaterally.  Mild posterior oropharyngeal erythema.  Eyes: Conjunctivae and lids are normal. Pupils are equal, round, and reactive to light. Right eye exhibits no discharge. Left eye exhibits no discharge. No scleral icterus.  Neck: Trachea normal and normal range of motion. Neck supple. No tracheal tenderness present. No tracheal deviation present.  Cardiovascular: Normal rate, regular rhythm, S1 normal, S2 normal, normal heart sounds and normal pulses.  Exam reveals no  gallop, no distant heart sounds and no friction rub.   No murmur heard. Pulmonary/Chest: Effort normal and breath sounds normal. No accessory muscle usage or stridor. No tachypnea and no bradypnea. No respiratory distress. He has no decreased breath sounds. He has no wheezes. He has no rhonchi. He has no rales. He exhibits no tenderness.  Abdominal: Soft. Bowel sounds are normal. He exhibits no distension, no abdominal bruit, no pulsatile midline mass and no mass. There is no hepatosplenomegaly. There is no tenderness. There is no rigidity, no rebound and no guarding. No hernia.  Musculoskeletal: Normal range of motion.  Lymphadenopathy:       Head (right side): No submental, no submandibular, no tonsillar, no preauricular, no posterior auricular and no occipital adenopathy present.       Head (left side): No submental, no submandibular, no tonsillar, no preauricular, no posterior auricular and no occipital adenopathy present.    He has no cervical adenopathy.       Right: No supraclavicular adenopathy present.       Left: No supraclavicular adenopathy present.  Neurological: He is alert and oriented to person, place, and time. He has normal strength.  Skin: Skin is warm, dry and intact. No rash noted. He is not diaphoretic. No cyanosis. No pallor. Nails show no clubbing.  Psychiatric: He has a normal mood and affect. His  speech is normal and behavior is normal. Judgment and thought content normal. Cognition and memory are normal.  Vitals reviewed.     Assessment & Plan:  1. Chronic rhinitis and Acute Maxillary Sinusitis -Try saline nasal spray for chronic rhinitis/sinusitis.  Two sprays each nostril at bedtime- do not sniff.  Pinch and hold nose close after sprays and tilt head back.   -If this issue continues and does not get better, then he needs to be referred to Dr. Narda Bonds (ENT) or his old ENT doctor he used to see.  2. Gastroesophageal reflux disease, esophagitis presence not  specified Stop taking the Prevacid right not and try taking the Nexium for next 5 days with 1 tablet by mouth daily.  If this is working, then let's give you a prescription for 20mg  daily.  Stay away from foods that you know are your triggers for reflux and make sure you sit up for 30-45 minutes after each meal to avoid reflux.  3. Hypogonadism in male Last shot was 08/20/14.  Will get another testosterone shot today.  Next shot due 09/22/14. - testosterone cypionate (DEPOTESTOTERONE CYPIONATE) injection 300 mg; Inject 1.5 mLs (300 mg total) into the muscle once.  Filled out work note for [employer]- The note is from 09/08/14 to 09/13/14.  He states his next flight scheduled is 09/16/14.    If you are not feeling better in 7-10 days, then please call the office.  Rossie Bretado, Lise Auer, PA-C 1:34 PM [facility]

## 2014-09-25 ENCOUNTER — Ambulatory Visit (INDEPENDENT_AMBULATORY_CARE_PROVIDER_SITE_OTHER): Payer: 59 | Admitting: *Deleted

## 2014-09-25 DIAGNOSIS — E291 Testicular hypofunction: Secondary | ICD-10-CM

## 2014-09-25 DIAGNOSIS — Z23 Encounter for immunization: Secondary | ICD-10-CM

## 2014-09-25 MED ORDER — TESTOSTERONE CYPIONATE 200 MG/ML IM SOLN
300.0000 mg | Freq: Once | INTRAMUSCULAR | Status: AC
  Administered 2014-09-25: 300 mg via INTRAMUSCULAR

## 2014-10-15 ENCOUNTER — Ambulatory Visit (INDEPENDENT_AMBULATORY_CARE_PROVIDER_SITE_OTHER): Payer: 59 | Admitting: *Deleted

## 2014-10-15 DIAGNOSIS — E291 Testicular hypofunction: Secondary | ICD-10-CM

## 2014-10-15 MED ORDER — TESTOSTERONE CYPIONATE 200 MG/ML IM SOLN
300.0000 mg | Freq: Once | INTRAMUSCULAR | Status: AC
  Administered 2014-10-15: 300 mg via INTRAMUSCULAR

## 2014-10-15 NOTE — Progress Notes (Signed)
Patient ID: Jay Brooks, male   DOB: [birth year], 53 y.o.   MRN: [id number] Patient presents for testosterone injection for hypogonadism.  Patient received 1.5 cc IM right glut.  Patient tolerated well.

## 2014-10-16 ENCOUNTER — Ambulatory Visit: Payer: Self-pay

## 2014-10-22 ENCOUNTER — Ambulatory Visit (INDEPENDENT_AMBULATORY_CARE_PROVIDER_SITE_OTHER): Payer: 59 | Admitting: Physician Assistant

## 2014-10-22 ENCOUNTER — Encounter: Payer: Self-pay | Admitting: Physician Assistant

## 2014-10-22 VITALS — BP 136/80 | HR 72 | Temp 98.2°F | Resp 16 | Ht 69.5 in | Wt 170.0 lb

## 2014-10-22 DIAGNOSIS — R0989 Other specified symptoms and signs involving the circulatory and respiratory systems: Secondary | ICD-10-CM

## 2014-10-22 DIAGNOSIS — E291 Testicular hypofunction: Secondary | ICD-10-CM

## 2014-10-22 DIAGNOSIS — Z79899 Other long term (current) drug therapy: Secondary | ICD-10-CM | POA: Insufficient documentation

## 2014-10-22 DIAGNOSIS — R739 Hyperglycemia, unspecified: Secondary | ICD-10-CM

## 2014-10-22 DIAGNOSIS — E782 Mixed hyperlipidemia: Secondary | ICD-10-CM | POA: Insufficient documentation

## 2014-10-22 DIAGNOSIS — R7309 Other abnormal glucose: Secondary | ICD-10-CM

## 2014-10-22 DIAGNOSIS — K219 Gastro-esophageal reflux disease without esophagitis: Secondary | ICD-10-CM

## 2014-10-22 DIAGNOSIS — L309 Dermatitis, unspecified: Secondary | ICD-10-CM

## 2014-10-22 DIAGNOSIS — E559 Vitamin D deficiency, unspecified: Secondary | ICD-10-CM

## 2014-10-22 DIAGNOSIS — E785 Hyperlipidemia, unspecified: Secondary | ICD-10-CM

## 2014-10-22 DIAGNOSIS — I10 Essential (primary) hypertension: Secondary | ICD-10-CM

## 2014-10-22 LAB — CBC WITH DIFFERENTIAL/PLATELET
Basophils Absolute: 0 10*3/uL (ref 0.0–0.1)
Basophils Relative: 0 % (ref 0–1)
Eosinophils Absolute: 0.1 10*3/uL (ref 0.0–0.7)
Eosinophils Relative: 2 % (ref 0–5)
HCT: 46.6 % (ref 39.0–52.0)
Hemoglobin: 16 g/dL (ref 13.0–17.0)
Lymphocytes Relative: 20 % (ref 12–46)
Lymphs Abs: 1.2 10*3/uL (ref 0.7–4.0)
MCH: 31.4 pg (ref 26.0–34.0)
MCHC: 34.3 g/dL (ref 30.0–36.0)
MCV: 91.4 fL (ref 78.0–100.0)
MPV: 8.9 fL — ABNORMAL LOW (ref 9.4–12.4)
Monocytes Absolute: 0.8 10*3/uL (ref 0.1–1.0)
Monocytes Relative: 13 % — ABNORMAL HIGH (ref 3–12)
Neutro Abs: 3.8 10*3/uL (ref 1.7–7.7)
Neutrophils Relative %: 65 % (ref 43–77)
Platelets: 188 10*3/uL (ref 150–400)
RBC: 5.1 MIL/uL (ref 4.22–5.81)
RDW: 13 % (ref 11.5–15.5)
WBC: 5.8 10*3/uL (ref 4.0–10.5)

## 2014-10-22 LAB — HEMOGLOBIN A1C
Hgb A1c MFr Bld: 5.2 % (ref <5.7)
Mean Plasma Glucose: 103 mg/dL (ref <117)

## 2014-10-22 NOTE — Progress Notes (Addendum)
Subjective:    Patient ID: Jay Brooks, male    DOB: 01-Nov-1961, 53 y.o.   MRN: [id number]  HPI A 53yo Caucasian male presents to the office today for follow up and for forms that need to be filled out for insurance company.  Patient did cancel physical appointment in December and then just found out that the insurance paperwork needed to filled out.  Patient has history of labile hypertension, hyperlipidemia, GERD, and hypogonadism.  Patient states he also has two red spots on right bicep that he is concerned about.  He states they are non-pruritic and not painful.  No new laundry detergents or soaps.  He did start Malarone medication yesterday due to going to another country in a couple of days and being a Occupational hygienist.  He states the Prevacid is helping his GERD.  His last testosterone injection was 10/15/14 and he denies any symptoms from them and states they are helping. Patient does report sore throat that started yesterday.  He states it might be from Malarone medication.  Patient states the right side hurts more than left side.   Patient is not exercising, but states he does walk a lot and does not have time to work out right now. Patient states he eats 3 meals a day. B- Oatmeal L- sandwich or salad  D-Chicken with vegetables Snacks- None Beverage- Sweet Ice tea, Regular Soda (2-3 per week), coffee (starbucks 1 cup in mornings), water GFR= 83 on 05/22/14 Patient does not take any medication for labile hypertension or hyperlipidemia. No results found for: CHOL, HDL, LDLCALC, LDLDIRECT, TRIG, CHOLHDL) No results found for: HGBA1C] Lab Results  Component Value Date   VD25OH 71 05/22/2014   Review of Systems  Constitutional: Negative.  Negative for fever, chills, diaphoresis and fatigue.  HENT: Negative for congestion, ear discharge, ear pain, postnasal drip, rhinorrhea, sinus pressure, sore throat and trouble swallowing.   Eyes: Negative.   Respiratory: Positive for cough.    Non-productive  Cardiovascular: Negative.   Gastrointestinal: Negative.   Genitourinary: Negative.   Skin: Positive for rash.  Neurological: Negative for dizziness, light-headedness and headaches.  Psychiatric/Behavioral: Negative.    Past Medical History  Diagnosis Date  . Chronic sinusitis   . Sleep deficient   . Diverticulosis of colon (without mention of hemorrhage)    Current Outpatient Prescriptions on File Prior to Visit  Medication Sig Dispense Refill  . atovaquone-proguanil (MALARONE) 250-100 MG TABS Take 1 tablet by mouth daily. As needed for work    . desonide (DESONATE) 0.05 % gel Apply topically 2 (two) times daily.    . lansoprazole (PREVACID 24HR) 15 MG capsule Take 15 mg by mouth daily before breakfast.    . testosterone cypionate (DEPOTESTOTERONE CYPIONATE) 200 MG/ML injection Inject 1 mL (200 mg total) into the muscle every 21 ( twenty-one) days. 10 mL 0   No current facility-administered medications on file prior to visit.   Allergies  Allergen Reactions  . Levaquin [Levofloxacin] Nausea And Vomiting    Patient states that after he took levaquin this past time he had nausea with vomiting  . Other     Benzol Peroxide- caused blisters     BP 136/80 mmHg  Pulse 72  Temp(Src) 98.2 F (36.8 C) (Temporal)  Resp 16  Ht 5' 9.5" (1.765 m)  Wt 170 lb (77.111 kg)  BMI 24.75 kg/m2  SpO2 98% Wt Readings from Last 3 Encounters:  10/22/14 170 lb (77.111 kg)  09/08/14 164 lb (74.39 kg)  08/29/14 166 lb 12.8 oz (75.66 kg)   Objective:   Physical Exam  Constitutional: He is oriented to person, place, and time. He appears well-developed and well-nourished. He does not have a sickly appearance. No distress.  HENT:  Head: Normocephalic.  Right Ear: Tympanic membrane, external ear and ear canal normal.  Left Ear: Tympanic membrane, external ear and ear canal normal.  Nose: Nose normal. Right sinus exhibits no maxillary sinus tenderness and no frontal sinus tenderness.  Left sinus exhibits no maxillary sinus tenderness and no frontal sinus tenderness.  Mouth/Throat: Uvula is midline and mucous membranes are normal. Mucous membranes are not pale and not dry. No trismus in the jaw. No uvula swelling. Posterior oropharyngeal erythema present. No oropharyngeal exudate, posterior oropharyngeal edema or tonsillar abscesses.  Mild posterior oropharyngeal erythema.  Eyes: Conjunctivae and lids are normal. Pupils are equal, round, and reactive to light. Right eye exhibits no discharge. Left eye exhibits no discharge. No scleral icterus.  Neck: Trachea normal, normal range of motion and phonation normal. Neck supple. No tracheal tenderness present. No tracheal deviation present.  Cardiovascular: Normal rate, regular rhythm, S1 normal, S2 normal, normal heart sounds, intact distal pulses and normal pulses.  Exam reveals no gallop, no distant heart sounds and no friction rub.   No murmur heard. Pulmonary/Chest: Effort normal and breath sounds normal. No stridor. No respiratory distress. He has no decreased breath sounds. He has no wheezes. He has no rhonchi. He has no rales. He exhibits no tenderness.  Abdominal: Soft. Bowel sounds are normal. He exhibits no distension, no abdominal bruit, no pulsatile midline mass and no mass. There is no hepatosplenomegaly. There is no tenderness. There is no rebound and no guarding. No hernia.  Lymphadenopathy:  No tenderness or LAD.  Neurological: He is alert and oriented to person, place, and time. He has normal strength. Gait normal.  Skin: Skin is warm, dry and intact. Rash noted. Rash is macular. He is not diaphoretic.  Two erythematous non-pruritic macules on upper inner right bicep that are about 0.5-1cm in diameter.  Psychiatric: He has a normal mood and affect. His speech is normal and behavior is normal. Judgment and thought content normal. Cognition and memory are normal.  Vitals reviewed.  Assessment & Plan:  1. Labile  hypertension Monitor blood pressure at home.  Please let us know if BP gets >140/90.  Reminder to go to the ER if any CP, SOB, nausea, dizziness, severe HA, changes vision/speech, left arm numbness and tingling and jaw pain. - BASIC METABOLIC PANEL WITH GFR - CBC with Differential - Hepatic function panel  2. Hyperlipidemia Please follow recommended diet and exercise.  Check Cholesterol. - Lipid panel  3. Elevated random blood glucose level Please follow recommended diet and exercise.  Check A1C and Insulin level. - Hemoglobin A1c - Insulin, fasting  4. Vitamin D deficiency Might start on Vitamin D depending on lab results.  Check vitamin D level. - Vit D  25 hydroxy   5. Hypogonadism in male Continue Testosterone shots.  Check testosterone level. - Testosterone  6. Gastroesophageal reflux disease, esophagitis presence not specified Continue Prevacid as prescribed.  7. Encounter for long-term (current) use of medications Will monitor kidney and liver function. - BASIC METABOLIC PANEL WITH GFR - CBC with Differential - Hepatic function panel  8. Dermatitis -Start using hydrocortisone cream over the counter- follow directions on box. If the rash is not improving in 10-14 days, then please call the office.  Discussed medication effects  and SE's.  Pt agreed to treatment plan. Push out physical appt to 3 months from today.  Barnie Sopko, Lise Auer, PA-C 9:36 AM [facility]

## 2014-10-22 NOTE — Patient Instructions (Addendum)
-Please continue medications as prescribed. - I will send in paperwork once lab results are in. I will message you MyChart about lab results. Please reschedule physical to March 2016   If the sore throat gets worse, then please call the office. Dermatitis- Take Hydrocortisone cream OTC- follow directions on box.   Hypertension Hypertension, commonly called high blood pressure, is when the force of blood pumping through your arteries is too strong. Your arteries are the blood vessels that carry blood from your heart throughout your body. A blood pressure reading consists of a higher number over a lower number, such as 110/72. The higher number (systolic) is the pressure inside your arteries when your heart pumps. The lower number (diastolic) is the pressure inside your arteries when your heart relaxes. Ideally you want your blood pressure below 120/80. Hypertension forces your heart to work harder to pump blood. Your arteries may become narrow or stiff. Having hypertension puts you at risk for heart disease, stroke, and other problems.  RISK FACTORS Some risk factors for high blood pressure are controllable. Others are not.  Risk factors you cannot control include:   Race. You may be at higher risk if you are African American.  Age. Risk increases with age.  Gender. Men are at higher risk than women before age 38 years. After age 14, women are at higher risk than men. Risk factors you can control include:  Not getting enough exercise or physical activity.  Being overweight.  Getting too much fat, sugar, calories, or salt in your diet.  Drinking too much alcohol. SIGNS AND SYMPTOMS Hypertension does not usually cause signs or symptoms. Extremely high blood pressure (hypertensive crisis) may cause headache, anxiety, shortness of breath, and nosebleed. DIAGNOSIS  To check if you have hypertension, your health care provider will measure your blood pressure while you are seated, with your  arm held at the level of your heart. It should be measured at least twice using the same arm. Certain conditions can cause a difference in blood pressure between your right and left arms. A blood pressure reading that is higher than normal on one occasion does not mean that you need treatment. If one blood pressure reading is high, ask your health care provider about having it checked again. TREATMENT  Treating high blood pressure includes making lifestyle changes and possibly taking medicine. Living a healthy lifestyle can help lower high blood pressure. You may need to change some of your habits. Lifestyle changes may include:  Following the DASH diet. This diet is high in fruits, vegetables, and whole grains. It is low in salt, red meat, and added sugars.  Getting at least 2 hours of brisk physical activity every week.  Losing weight if necessary.  Not smoking.  Limiting alcoholic beverages.  Learning ways to reduce stress. If lifestyle changes are not enough to get your blood pressure under control, your health care provider may prescribe medicine. You may need to take more than one. Work closely with your health care provider to understand the risks and benefits. HOME CARE INSTRUCTIONS  Have your blood pressure rechecked as directed by your health care provider.   Take medicines only as directed by your health care provider. Follow the directions carefully. Blood pressure medicines must be taken as prescribed. The medicine does not work as well when you skip doses. Skipping doses also puts you at risk for problems.   Do not smoke.   Monitor your blood pressure at home as directed by your  health care provider. SEEK MEDICAL CARE IF:   You think you are having a reaction to medicines taken.  You have recurrent headaches or feel dizzy.  You have swelling in your ankles.  You have trouble with your vision. SEEK IMMEDIATE MEDICAL CARE IF:  You develop a severe headache or  confusion.  You have unusual weakness, numbness, or feel faint.  You have severe chest or abdominal pain.  You vomit repeatedly.  You have trouble breathing. MAKE SURE YOU:   Understand these instructions.  Will watch your condition.  Will get help right away if you are not doing well or get worse. Document Released: 10/24/2005 Document Revised: 03/10/2014 Document Reviewed: 08/16/2013 [facility] Patient Information 2015 [place], [place]. This information is not intended to replace advice given to you by your health care provider. Make sure you discuss any questions you have with your health care provider.   Contact Dermatitis Contact dermatitis is a reaction to certain substances that touch the skin. Contact dermatitis can be either irritant contact dermatitis or allergic contact dermatitis. Irritant contact dermatitis does not require previous exposure to the substance for a reaction to occur.Allergic contact dermatitis only occurs if you have been exposed to the substance before. Upon a repeat exposure, your body reacts to the substance.  CAUSES  Many substances can cause contact dermatitis. Irritant dermatitis is most commonly caused by repeated exposure to mildly irritating substances, such as:  Makeup.  Soaps.  Detergents.  Bleaches.  Acids.  Metal salts, such as nickel. Allergic contact dermatitis is most commonly caused by exposure to:  Poisonous plants.  Chemicals (deodorants, shampoos).  Jewelry.  Latex.  Neomycin in triple antibiotic cream.  Preservatives in products, including clothing. SYMPTOMS  The area of skin that is exposed may develop:  Dryness or flaking.  Redness.  Cracks.  Itching.  Pain or a burning sensation.  Blisters. With allergic contact dermatitis, there may also be swelling in areas such as the eyelids, mouth, or genitals.  DIAGNOSIS  Your caregiver can usually tell what the problem is by doing a physical exam. In cases where  the cause is uncertain and an allergic contact dermatitis is suspected, a patch skin test may be performed to help determine the cause of your dermatitis. TREATMENT Treatment includes protecting the skin from further contact with the irritating substance by avoiding that substance if possible. Barrier creams, powders, and gloves may be helpful. Your caregiver may also recommend:  Steroid creams or ointments applied 2 times daily. For best results, soak the rash area in cool water for 20 minutes. Then apply the medicine. Cover the area with a plastic wrap. You can store the steroid cream in the refrigerator for a "chilly" effect on your rash. That may decrease itching. Oral steroid medicines may be needed in more severe cases.  Antibiotics or antibacterial ointments if a skin infection is present.  Antihistamine lotion or an antihistamine taken by mouth to ease itching.  Lubricants to keep moisture in your skin.  Burow's solution to reduce redness and soreness or to dry a weeping rash. Mix one packet or tablet of solution in 2 cups cool water. Dip a clean washcloth in the mixture, wring it out a bit, and put it on the affected area. Leave the cloth in place for 30 minutes. Do this as often as possible throughout the day.  Taking several cornstarch or baking soda baths daily if the area is too large to cover with a washcloth. Harsh chemicals, such as alkalis  or acids, can cause skin damage that is like a burn. You should flush your skin for 15 to 20 minutes with cold water after such an exposure. You should also seek immediate medical care after exposure. Bandages (dressings), antibiotics, and pain medicine may be needed for severely irritated skin.  HOME CARE INSTRUCTIONS  Avoid the substance that caused your reaction.  Keep the area of skin that is affected away from hot water, soap, sunlight, chemicals, acidic substances, or anything else that would irritate your skin.  Do not scratch the  rash. Scratching may cause the rash to become infected.  You may take cool baths to help stop the itching.  Only take over-the-counter or prescription medicines as directed by your caregiver.  See your caregiver for follow-up care as directed to make sure your skin is healing properly. SEEK MEDICAL CARE IF:   Your condition is not better after 3 days of treatment.  You seem to be getting worse.  You see signs of infection such as swelling, tenderness, redness, soreness, or warmth in the affected area.  You have any problems related to your medicines. Document Released: 10/21/2000 Document Revised: 01/16/2012 Document Reviewed: 03/29/2011 [facility] Patient Information 2015 [place], [place]. This information is not intended to replace advice given to you by your health care provider. Make sure you discuss any questions you have with your health care provider.

## 2014-10-23 LAB — BASIC METABOLIC PANEL WITH GFR
BUN: 13 mg/dL (ref 6–23)
CO2: 29 mEq/L (ref 19–32)
Calcium: 9.4 mg/dL (ref 8.4–10.5)
Chloride: 101 mEq/L (ref 96–112)
Creat: 1.02 mg/dL (ref 0.50–1.35)
GFR, Est African American: >89 mL/min
GFR, Est Non African American: 82 mL/min
Glucose, Bld: 85 mg/dL (ref 70–99)
Potassium: 4.2 mEq/L (ref 3.5–5.3)
Sodium: 137 mEq/L (ref 135–145)

## 2014-10-23 LAB — HEPATIC FUNCTION PANEL
ALT: 17 U/L (ref 0–53)
AST: 23 U/L (ref 0–37)
Albumin: 4.4 g/dL (ref 3.5–5.2)
Alkaline Phosphatase: 41 U/L (ref 39–117)
Bilirubin, Direct: 0.1 mg/dL (ref 0.0–0.3)
Indirect Bilirubin: 0.6 mg/dL (ref 0.2–1.2)
Total Bilirubin: 0.7 mg/dL (ref 0.2–1.2)
Total Protein: 7.1 g/dL (ref 6.0–8.3)

## 2014-10-23 LAB — INSULIN, FASTING: Insulin fasting, serum: 5.4 u[IU]/mL (ref 2.0–19.6)

## 2014-10-23 LAB — LIPID PANEL
Cholesterol: 136 mg/dL (ref 0–200)
HDL: 38 mg/dL — ABNORMAL LOW (ref >39)
LDL Cholesterol: 68 mg/dL (ref 0–99)
Total CHOL/HDL Ratio: 3.6 Ratio
Triglycerides: 151 mg/dL — ABNORMAL HIGH (ref <150)
VLDL: 30 mg/dL (ref 0–40)

## 2014-10-23 LAB — VITAMIN D 25 HYDROXY (VIT D DEFICIENCY, FRACTURES): Vit D, 25-Hydroxy: 47 ng/mL (ref 30–100)

## 2014-10-23 LAB — TESTOSTERONE: Testosterone: 799 ng/dL (ref 300–890)

## 2014-10-27 ENCOUNTER — Encounter: Payer: Self-pay | Admitting: Internal Medicine

## 2014-11-13 ENCOUNTER — Ambulatory Visit (INDEPENDENT_AMBULATORY_CARE_PROVIDER_SITE_OTHER): Payer: 59 | Admitting: *Deleted

## 2014-11-13 DIAGNOSIS — E291 Testicular hypofunction: Secondary | ICD-10-CM

## 2014-11-13 MED ORDER — TESTOSTERONE CYPIONATE 200 MG/ML IM SOLN
300.0000 mg | Freq: Once | INTRAMUSCULAR | Status: AC
  Administered 2014-11-13: 300 mg via INTRAMUSCULAR

## 2014-11-13 NOTE — Progress Notes (Signed)
Patient ID: Jay Brooks, male   DOB: 1961/05/07, 54 y.o.   MRN: [id number] Patient presents for testosterone injection for hypogoinadism Tx.  Patient received 1.5 cc IM left glut.  Patient tolerated well.

## 2014-11-14 ENCOUNTER — Other Ambulatory Visit: Payer: Self-pay | Admitting: Physician Assistant

## 2014-11-14 ENCOUNTER — Other Ambulatory Visit: Payer: Self-pay | Admitting: Internal Medicine

## 2014-11-27 ENCOUNTER — Encounter: Payer: Self-pay | Admitting: Physician Assistant

## 2014-11-27 ENCOUNTER — Ambulatory Visit (INDEPENDENT_AMBULATORY_CARE_PROVIDER_SITE_OTHER): Payer: 59 | Admitting: Physician Assistant

## 2014-11-27 VITALS — BP 142/90 | HR 64 | Temp 98.0°F | Resp 16 | Ht 69.5 in | Wt 168.0 lb

## 2014-11-27 DIAGNOSIS — J0100 Acute maxillary sinusitis, unspecified: Secondary | ICD-10-CM

## 2014-11-27 MED ORDER — PREDNISONE 10 MG PO TABS: ORAL_TABLET | ORAL | Status: DC

## 2014-11-27 MED ORDER — AZITHROMYCIN 250 MG PO TABS: ORAL_TABLET | ORAL | Status: DC

## 2014-11-27 NOTE — Progress Notes (Signed)
HPI  A Caucasian 54 y.o.male presents to the office today due to sore throat, nasal congestion and nausea that started 2 days ago.  He states his sxs are getting worse.  He has tried nothing.  His daughter at home has been sick with Strep Throat.  He reports fatigue, ear congestion, sinus pressure, rhinorrhea, non-productive cough and abdominal pressure in lower abdomen.  He denies fever, chills, sweats, trouble swallowing, voice change, SOB, wheezing, CP, abdominal pain, vomiting, diarrhea, constipation, dizziness, lightheadedness and headaches.  Review of Systems  All other systems reviewed and are negative.  Past Medical History-  Past Medical History  Diagnosis Date  . Chronic sinusitis   . Sleep deficient   . Diverticulosis of colon (without mention of hemorrhage)    Medications-  Current Outpatient Prescriptions on File Prior to Visit  Medication Sig Dispense Refill  . atovaquone-proguanil (MALARONE) 250-100 MG TABS Take 1 tablet by mouth daily. As needed for work    . desonide (DESONATE) 0.05 % gel Apply topically 2 (two) times daily.    . lansoprazole (PREVACID 24HR) 15 MG capsule Take 15 mg by mouth daily before breakfast.    . testosterone cypionate (DEPOTESTOTERONE CYPIONATE) 200 MG/ML injection USE AS DIRECTED 10 mL 0   No current facility-administered medications on file prior to visit.   Allergies-  Allergies  Allergen Reactions  . Allegra [Fexofenadine]   . Benadryl [Diphenhydramine] Other (See Comments)    Insomnia  . Levaquin [Levofloxacin] Nausea And Vomiting    Patient states that after he took levaquin this past time he had nausea with vomiting  . Other     Benzol Peroxide- caused blisters   Physical Exam BP 142/90 mmHg  Pulse 64  Temp(Src) 98 F (36.7 C) (Temporal)  Resp 16  Ht 5' 9.5" (1.765 m)  Wt 168 lb (76.204 kg)  BMI 24.46 kg/m2  SpO2 98% Wt Readings from Last 3 Encounters:  11/27/14 168 lb (76.204 kg)  10/22/14 170 lb (77.111 kg)  09/08/14  164 lb (74.39 kg)  Vitals Reviewed. General Appearance: Well nourished, in no apparent distress and had pleasant demeanor. Eyes:  PERRLA. EOMI. Conjunctiva is pink without edema, erythema or yellowing.  No scleral icterus. Sinuses: Maxillary tenderness upon palpation bilaterally.  No Frontal tenderness. Ears: No erythema, edema or tenderness on both external ear cartilages and ear canals.  TMs are intact bilaterally with normal light reflexes and without erythema, edema or bulging. Nose: Nose is symmetrical and turbinates are erythematous and edematous bilaterally.  No polyps or paleness. Rhinorrhea present. Throat: Oral pharynx is pink and moist. Erythematous and non-edematous posterior pharynx.  Mucosa is intact and without lesions. Tonsils are at +1 station bilaterally and do not have exudate.    Uvula is midline and not swollen. Neck: Supple,  LAD, trachea is midline. Full range of motion in neck intact Respiratory: CTAB,  r/r/w or stridor. No increased effort of breathing. Cardio: RRR.   m/r/g.  S1S2nl.  Pulses B/L +2  Abdomen: Symmetrical, soft, nontender, and flat.  +BS nl x4.   Skin: Warm, dry, intact without rashes, lesions, ecchymosis, yellowing, cyanosis.  Neuro: Alert and oriented X3, cooperative.  Mood and affect appropriate to situation.  CN II-XII grossly intact.   Psych: Insight and Judgment appropriate.   Assessment and Plan 1. Acute maxillary sinusitis, recurrence not specified - azithromycin (ZITHROMAX) 250 MG tablet; Take 2 tablets PO on day 1, then 1 tablet PO Q24H x 4 days  Dispense: 6 tablet; Refill: 1 -  predniSONE (DELTASONE) 10 MG tablet; Take 3 tablets PO for 2 days, then take 2 tablets PO for 2 days, then take 1 tablet PO for 3 days.  Dispense: 13 tablet; Refill: 0 If sinus infections continue, then patient needs to see ENT.  Discussed medication effects and SE's.  Pt agreed to treatment plan. If you are not feeling better in 10-14 days, then please call the  office. Please move your physical appt to March 2016.  Sargent Mankey, Stephani Police, PA-C 9:38 AM [facility]

## 2014-11-27 NOTE — Patient Instructions (Signed)
-Take Z-Pak as prescribed. -Take Prednisone as prescribed for inflammation. -Please make sure you are drinking water to stay hydrated.   If the sinus infections continue, then must send to ENT doctor.   If you are not feeling better in 10-14 days, then please call the office.   Sinusitis Sinusitis is redness, soreness, and inflammation of the paranasal sinuses. Paranasal sinuses are air pockets within the bones of your face (beneath the eyes, the middle of the forehead, or above the eyes). In healthy paranasal sinuses, mucus is able to drain out, and air is able to circulate through them by way of your nose. However, when your paranasal sinuses are inflamed, mucus and air can become trapped. This can allow bacteria and other germs to grow and cause infection. Sinusitis can develop quickly and last only a short time (acute) or continue over a long period (chronic). Sinusitis that lasts for more than 12 weeks is considered chronic.  CAUSES  Causes of sinusitis include:  Allergies.  Structural abnormalities, such as displacement of the cartilage that separates your nostrils (deviated septum), which can decrease the air flow through your nose and sinuses and affect sinus drainage.  Functional abnormalities, such as when the small hairs (cilia) that line your sinuses and help remove mucus do not work properly or are not present. SIGNS AND SYMPTOMS  Symptoms of acute and chronic sinusitis are the same. The primary symptoms are pain and pressure around the affected sinuses. Other symptoms include:  Upper toothache.  Earache.  Headache.  Bad breath.  Decreased sense of smell and taste.  A cough, which worsens when you are lying flat.  Fatigue.  Fever.  Thick drainage from your nose, which often is green and may contain pus (purulent).  Swelling and warmth over the affected sinuses. DIAGNOSIS  Your health care provider will perform a physical exam. During the exam, your health care  provider may:  Look in your nose for signs of abnormal growths in your nostrils (nasal polyps).  Tap over the affected sinus to check for signs of infection.  View the inside of your sinuses (endoscopy) using an imaging device that has a light attached (endoscope). If your health care provider suspects that you have chronic sinusitis, one or more of the following tests may be recommended:  Allergy tests.  Nasal culture. A sample of mucus is taken from your nose, sent to a lab, and screened for bacteria.  Nasal cytology. A sample of mucus is taken from your nose and examined by your health care provider to determine if your sinusitis is related to an allergy. TREATMENT  Most cases of acute sinusitis are related to a viral infection and will resolve on their own within 10 days. Sometimes medicines are prescribed to help relieve symptoms (pain medicine, decongestants, nasal steroid sprays, or saline sprays).  However, for sinusitis related to a bacterial infection, your health care provider will prescribe antibiotic medicines. These are medicines that will help kill the bacteria causing the infection.  Rarely, sinusitis is caused by a fungal infection. In theses cases, your health care provider will prescribe antifungal medicine. For some cases of chronic sinusitis, surgery is needed. Generally, these are cases in which sinusitis recurs more than 3 times per year, despite other treatments. HOME CARE INSTRUCTIONS   Drink plenty of water. Water helps thin the mucus so your sinuses can drain more easily.  Use a humidifier.  Inhale steam 3 to 4 times a day (for example, sit in the bathroom  with the shower running).  Apply a warm, moist washcloth to your face 3 to 4 times a day, or as directed by your health care provider.  Use saline nasal sprays to help moisten and clean your sinuses.  Take medicines only as directed by your health care provider.  If you were prescribed either an  antibiotic or antifungal medicine, finish it all even if you start to feel better. SEEK IMMEDIATE MEDICAL CARE IF:  You have increasing pain or severe headaches.  You have nausea, vomiting, or drowsiness.  You have swelling around your face.  You have vision problems.  You have a stiff neck.  You have difficulty breathing. MAKE SURE YOU:   Understand these instructions.  Will watch your condition.  Will get help right away if you are not doing well or get worse. Document Released: 10/24/2005 Document Revised: 03/10/2014 Document Reviewed: 11/08/2011 [facility] Patient Information 2015 [place], [place]. This information is not intended to replace advice given to you by your health care provider. Make sure you discuss any questions you have with your health care provider.

## 2014-12-01 ENCOUNTER — Encounter: Payer: Self-pay | Admitting: Internal Medicine

## 2014-12-01 ENCOUNTER — Ambulatory Visit (INDEPENDENT_AMBULATORY_CARE_PROVIDER_SITE_OTHER): Payer: 59 | Admitting: Internal Medicine

## 2014-12-01 VITALS — BP 126/82 | HR 76 | Temp 97.9°F | Resp 16 | Ht 69.75 in | Wt 168.8 lb

## 2014-12-01 DIAGNOSIS — R5383 Other fatigue: Secondary | ICD-10-CM

## 2014-12-01 DIAGNOSIS — J310 Chronic rhinitis: Secondary | ICD-10-CM

## 2014-12-01 DIAGNOSIS — R05 Cough: Secondary | ICD-10-CM

## 2014-12-01 DIAGNOSIS — E785 Hyperlipidemia, unspecified: Secondary | ICD-10-CM

## 2014-12-01 DIAGNOSIS — E291 Testicular hypofunction: Secondary | ICD-10-CM

## 2014-12-01 DIAGNOSIS — E559 Vitamin D deficiency, unspecified: Secondary | ICD-10-CM | POA: Insufficient documentation

## 2014-12-01 DIAGNOSIS — R0989 Other specified symptoms and signs involving the circulatory and respiratory systems: Secondary | ICD-10-CM

## 2014-12-01 DIAGNOSIS — Z1212 Encounter for screening for malignant neoplasm of rectum: Secondary | ICD-10-CM

## 2014-12-01 DIAGNOSIS — Z79899 Other long term (current) drug therapy: Secondary | ICD-10-CM

## 2014-12-01 DIAGNOSIS — Z125 Encounter for screening for malignant neoplasm of prostate: Secondary | ICD-10-CM

## 2014-12-01 DIAGNOSIS — I10 Essential (primary) hypertension: Secondary | ICD-10-CM

## 2014-12-01 DIAGNOSIS — R7309 Other abnormal glucose: Secondary | ICD-10-CM | POA: Insufficient documentation

## 2014-12-01 DIAGNOSIS — Z111 Encounter for screening for respiratory tuberculosis: Secondary | ICD-10-CM

## 2014-12-01 DIAGNOSIS — R7303 Prediabetes: Secondary | ICD-10-CM

## 2014-12-01 LAB — IRON AND TIBC
%SAT: 25 % (ref 20–55)
Iron: 70 ug/dL (ref 42–165)
TIBC: 281 ug/dL (ref 215–435)
UIBC: 211 ug/dL (ref 125–400)

## 2014-12-01 LAB — MAGNESIUM: Magnesium: 2.1 mg/dL (ref 1.5–2.5)

## 2014-12-01 LAB — VITAMIN B12: Vitamin B-12: 498 pg/mL (ref 211–911)

## 2014-12-01 LAB — TSH: TSH: 3.151 u[IU]/mL (ref 0.350–4.500)

## 2014-12-01 MED ORDER — TESTOSTERONE CYPIONATE 200 MG/ML IM SOLN
300.0000 mg | Freq: Once | INTRAMUSCULAR | Status: AC
  Administered 2014-12-01: 300 mg via INTRAMUSCULAR

## 2014-12-01 NOTE — Progress Notes (Signed)
Patient ID: Jay Brooks, male   DOB: [birth year], 54 y.o.   MRN: [id number] Annual Comprehensive Examination  This very nice 54 y.o. MWM presents for complete physical.  Patient has been followed for HTN, T2_NIDDM  Prediabetes, Hyperlipidemia, and Vitamin D Deficiency. Patient does have chronic problems with poor sleep hygiene and has been evaluated by Dr Rae Lips at th [facility]. Patient does c/o chronic fatigue. Recently he was evaluate out of network by a Dr Tye Savoy who dx'd him with Lyme Dz although only 2 of the markers for Lyme Dz were (+) which actually does not qualify as a legitimate dx/o Lyme Dz. Nonetheless, patient was prescribed Doxycycline bid x 3 weeks. Patient does report hx/o migratory myalgias.    Labile elevated BP was been monitored expectantly since 2005. Patient's BP has been controlled at home.Today's BP: 126/82 mmHg. Patient denies any cardiac symptoms as chest pain, palpitations, shortness of breath, dizziness or ankle swelling.   Patient's hyperlipidemia is controlled with diet. Patient denies myalgias or other medication SE's. Last lipids were at goal - Total  Chol 136; HDL  38*; LDL 68; Trig 151 on 10/22/2014.   Patient is screened for prediabetes and denies reactive hypoglycemic symptoms, visual blurring, diabetic polys or paresthesias. Last A1c was  5.2% on 10/22/2014.     Patient has been on Testosterone Replacement & reports improved sense of well being. Finally, patient has history of Vitamin D Deficiency of 32 in 2009 and last vitamin D was 47 on 10/22/2014.  Medication Sig  . atovaquone-proguanil (MALARONE) 250-100 MG  Take 1 tablet by mouth daily. As needed for work  . lansoprazole (PREVACID 24HR) 15 MG capsule Take 15 mg by mouth daily before breakfast.  . DEPOTESTOTERONE) 200 MG/ML inj USE AS DIRECTED   Allergies  Allergen Reactions  . Allegra [Fexofenadine]   . Benadryl [Diphenhydramine] Other (See Comments)    Insomnia  . Levaquin  [Levofloxacin] Nausea And Vomiting    Patient states that after he took levaquin this past time he had nausea with vomiting  . Other     Benzol Peroxide- caused blisters   Past Medical History  Diagnosis Date  . Chronic sinusitis   . Sleep deficient   . Diverticulosis of colon (without mention of hemorrhage)    Health Maintenance  Topic Date Due  . TETANUS/TDAP  02/04/1978  . COLONOSCOPY  02/04/2009  . INFLUENZA VACCINE  06/08/2015   Immunization History  Administered Date(s) Administered  . Influenza Split 08/07/2012, 09/25/2014  . PPD Test 12/01/2014   Past Surgical History  Procedure Laterality Date  . Hernia repair     Family History  Problem Relation Age of Onset  . Breast cancer Maternal Grandmother   . Colon cancer Neg Hx   . Colon polyps Brother   . Colon polyps Maternal Uncle   . Cancer Mother 68    pancreatic    History   Social History  . Marital Status: Married    Spouse Name: N/A    Number of Children: 1  . Years of Education: N/A   Occupational History  . PIL[employer]   Social History Main Topics  . Smoking status: Never Smoker   . Smokeless tobacco: Never Used  . Alcohol Use: Yes     Comment: 3 drinks a month  . Drug Use: No  . Sexual Activity: Not on file   Other Topics Concern  . Not on file   Social History Narrative  Daily caffeine     ROS Constitutional: Denies fever, chills, weight loss/gain, headaches, insomnia, fatigue, night sweats or change in appetite. Eyes: Denies redness, blurred vision, diplopia, discharge, itchy or watery eyes.  ENT: Denies discharge, congestion, post nasal drip, epistaxis, sore throat, earache, hearing loss, dental pain, Tinnitus, Vertigo, Sinus pain or snoring.  Cardio: Denies chest pain, palpitations, irregular heartbeat, syncope, dyspnea, diaphoresis, orthopnea, PND, claudication or edema Respiratory: denies cough, dyspnea, DOE, pleurisy, hoarseness, laryngitis or wheezing.   Gastrointestinal: Denies dysphagia, heartburn, reflux, water brash, pain, cramps, nausea, vomiting, bloating, diarrhea, constipation, hematemesis, melena, hematochezia, jaundice or hemorrhoids Genitourinary: Denies dysuria, frequency, urgency, nocturia, hesitancy, discharge, hematuria or flank pain Musculoskeletal: Denies arthralgia, myalgia, stiffness, Jt. Swelling, pain, limp or strain/sprain. Denies Falls. Skin: Denies puritis, rash, hives, warts, acne, eczema or change in skin lesion Neuro: No weakness, tremor, incoordination, spasms, paresthesia or pain Psychiatric: Denies confusion, memory loss or sensory loss. Denies Depression. Endocrine: Denies change in weight, skin, hair change, nocturia, and paresthesia, diabetic polys, visual blurring or hyper / hypo glycemic episodes.  Heme/Lymph: No excessive bleeding, bruising or enlarged lymph nodes.  Physical Exam  BP 126/82 mmHg  Pulse 76  Temp(Src) 97.9 F (36.6 C)  Resp 16  Ht 5' 9.75" (1.772 m)  Wt 168 lb 12.8 oz (76.567 kg)  BMI 24.38 kg/m2  General Appearance: Well nourished, in no apparent distress. Eyes: PERRLA, EOMs, conjunctiva no swelling or erythema, normal fundi and vessels. Sinuses: No frontal/maxillary tenderness ENT/Mouth: EACs patent / TMs  nl. Nares clear without erythema, swelling, mucoid exudates. Oral hygiene is good. No erythema, swelling, or exudate. Tongue normal, non-obstructing. Tonsils not swollen or erythematous. Hearing normal.  Neck: Supple, thyroid normal. No bruits, nodes or JVD. Respiratory: Respiratory effort normal.  BS equal and clear bilateral without rales, rhonci, wheezing or stridor. Cardio: Heart sounds are normal with regular rate and rhythm and no murmurs, rubs or gallops. Peripheral pulses are normal and equal bilaterally without edema. No aortic or femoral bruits. Chest: symmetric with normal excursions and percussion.  Abdomen: Flat, soft, with bowl sounds. Nontender, no guarding, rebound,  hernias, masses, or organomegaly.  Lymphatics: Non tender without lymphadenopathy.  Genitourinary: No hernias.Testes nl. DRE - prostate nl for age - smooth & firm w/o nodules. Musculoskeletal: Full ROM all peripheral extremities, joint stability, 5/5 strength, and normal gait. Skin: Warm and dry without rashes, lesions, cyanosis, clubbing or  ecchymosis.  Neuro: Cranial nerves intact, reflexes equal bilaterally. Normal muscle tone, no cerebellar symptoms. Sensation intact.  Pysch: Awake and oriented X 3 with normal affect, insight and judgment appropriate.   Assessment and Plan  1. Labile hypertension  - Microalbumin / creatinine urine ratio - EKG 12-Lead - [place], RETROPERITNL ABD,  LTD  2. Hyperlipidemia   3. Prediabetes   4. Vitamin D deficiency   5. Chronic rhinitis   6. Other fatigue  - Vitamin B12 - Iron and TIBC - TSH  7. Screening for rectal cancer  - POC Hemoccult Bld/Stl   8. Prostate cancer screening  - PSA  9. Male hypogonadism  - DEPOTESTOTERONE  injection 300 mg; Inject 1.5 mLs (300 mg total) into the muscle once. (last was 3 weeks ago)  10. Screening examination for pulmonary tuberculosis  - PPD  11. Encounter for long-term (current) use of medications  - Urine Microscopic - Magnesium  12. Doubt Lyme Dz -   - Patient was advised that the Dx of Lyme Dz was in question & did/does not meet criterion for dx.   *  Recent labs done in dec 2015 were reviewed and did not warrant repeating/ duplicating at this point in time.   Continue prudent diet as discussed, weight control, BP monitoring, regular exercise, and medications as discussed.  Discussed med effects and SE's. Routine screening labs and tests as requested with regular follow-up as recommended.

## 2014-12-01 NOTE — Patient Instructions (Signed)
 Recommend the book "The END of DIETING" by Dr Joel Fuhrman   & the book "The END of DIABETES " by Dr Joel Fuhrman  At [employer] - get book & Audio CD's      Being diabetic has a  300% increased risk for heart attack, stroke, cancer, and alzheimer- type vascular dementia. It is very important that you work harder with diet by avoiding all foods that are white except chicken & fish. Avoid white rice (brown & wild rice is OK), white potatoes (sweetpotatoes in moderation is OK), White bread or wheat bread or anything made out of white flour like bagels, donuts, rolls, buns, biscuits, cakes, pastries, cookies, pizza crust, and pasta (made from white flour & egg whites) - vegetarian pasta or spinach or wheat pasta is OK. Multigrain breads like Arnold's or Pepper[employer], or multigrain sandwich thins or flatbreads.  Diet, exercise and weight loss can reverse and cure diabetes in the early stages.  Diet, exercise and weight loss is very important in the control and prevention of complications of diabetes which affects every system in your body, ie. Brain - dementia/stroke, eyes - glaucoma/blindness, heart - heart attack/heart failure, kidneys - dialysis, stomach - gastric paralysis, intestines - malabsorption, nerves - severe painful neuritis, circulation - gangrene & loss of a leg(s), and finally cancer and Alzheimers.    I recommend avoid fried & greasy foods,  sweets/candy, white rice (brown or wild rice or Quinoa is OK), white potatoes (sweet potatoes are OK) - anything made from white flour - bagels, doughnuts, rolls, buns, biscuits,white and wheat breads, pizza crust and traditional pasta made of white flour & egg white(vegetarian pasta or spinach or wheat pasta is OK).  Multi-grain bread is OK - like multi-grain flat bread or sandwich thins. Avoid alcohol in excess. Exercise is also important.    Eat all the vegetables you want - avoid meat, especially red meat and dairy - especially cheese.  Cheese  is the most concentrated form of trans-fats which is the worst thing to clog up our arteries. Veggie cheese is OK which can be found in the fresh produce section at [employer] or [employer] or [employer]  Preventive Care for Adults  A healthy lifestyle and preventive care can promote health and wellness. Preventive health guidelines for men include the following key practices:  A routine yearly physical is a good way to check with your health care provider about your health and preventative screening. It is a chance to share any concerns and updates on your health and to receive a thorough exam.  Visit your dentist for a routine exam and preventative care every 6 months. Brush your teeth twice a day and floss once a day. Good oral hygiene prevents tooth decay and gum disease.  The frequency of eye exams is based on your age, health, family medical history, use of contact lenses, and other factors. Follow your health care provider's recommendations for frequency of eye exams.  Eat a healthy diet. Foods such as vegetables, fruits, whole grains, low-fat dairy products, and lean protein foods contain the nutrients you need without too many calories. Decrease your intake of foods high in solid fats, added sugars, and salt. Eat the right amount of calories for you.Get information about a proper diet from your health care provider, if necessary.  Regular physical exercise is one of the most important things you can do for your health. Most adults should get at least 150 minutes of moderate-intensity exercise (any activity   that increases your heart rate and causes you to sweat) each week. In addition, most adults need muscle-strengthening exercises on 2 or more days a week.  Maintain a healthy weight. The body mass index (BMI) is a screening tool to identify possible weight problems. It provides an estimate of body fat based on height and weight. Your health care provider can find your BMI and can help  you achieve or maintain a healthy weight.For adults 20 years and older:  A BMI below 18.5 is considered underweight.  A BMI of 18.5 to 24.9 is normal.  A BMI of 25 to 29.9 is considered overweight.  A BMI of 30 and above is considered obese.  Maintain normal blood lipids and cholesterol levels by exercising and minimizing your intake of saturated fat. Eat a balanced diet with plenty of fruit and vegetables. Blood tests for lipids and cholesterol should begin at age 20 and be repeated every 5 years. If your lipid or cholesterol levels are high, you are over 50, or you are at high risk for heart disease, you may need your cholesterol levels checked more frequently.Ongoing high lipid and cholesterol levels should be treated with medicines if diet and exercise are not working.  If you smoke, find out from your health care provider how to quit. If you do not use tobacco, do not start.  Lung cancer screening is recommended for adults aged 55-80 years who are at high risk for developing lung cancer because of a history of smoking. A yearly low-dose CT scan of the lungs is recommended for people who have at least a 30-pack-year history of smoking and are a current smoker or have quit within the past 15 years. A pack year of smoking is smoking an average of 1 pack of cigarettes a day for 1 year (for example: 1 pack a day for 30 years or 2 packs a day for 15 years). Yearly screening should continue until the smoker has stopped smoking for at least 15 years. Yearly screening should be stopped for people who develop a health problem that would prevent them from having lung cancer treatment.  If you choose to drink alcohol, do not have more than 2 drinks per day. One drink is considered to be 12 ounces (355 mL) of beer, 5 ounces (148 mL) of wine, or 1.5 ounces (44 mL) of liquor.  Avoid use of street drugs. Do not share needles with anyone. Ask for help if you need support or instructions about stopping the  use of drugs.  High blood pressure causes heart disease and increases the risk of stroke. Your blood pressure should be checked at least every 1-2 years. Ongoing high blood pressure should be treated with medicines, if weight loss and exercise are not effective.  If you are 45-79 years old, ask your health care provider if you should take aspirin to prevent heart disease.  Diabetes screening involves taking a blood sample to check your fasting blood sugar level. This should be done once every 3 years, after age 45, if you are within normal weight and without risk factors for diabetes. Testing should be considered at a younger age or be carried out more frequently if you are overweight and have at least 1 risk factor for diabetes.  Colorectal cancer can be detected and often prevented. Most routine colorectal cancer screening begins at the age of 50 and continues through age 75. However, your health care provider may recommend screening at an earlier age if you have   risk factors for colon cancer. On a yearly basis, your health care provider may provide home test kits to check for hidden blood in the stool. Use of a small camera at the end of a tube to directly examine the colon (sigmoidoscopy or colonoscopy) can detect the earliest forms of colorectal cancer. Talk to your health care provider about this at age 50, when routine screening begins. Direct exam of the colon should be repeated every 5-10 years through age 75, unless early forms of precancerous polyps or small growths are found.   Talk with your health care provider about prostate cancer screening.  Testicular cancer screening isrecommended for adult males. Screening includes self-exam, a health care provider exam, and other screening tests. Consult with your health care provider about any symptoms you have or any concerns you have about testicular cancer.  Use sunscreen. Apply sunscreen liberally and repeatedly throughout the day. You should  seek shade when your shadow is shorter than you. Protect yourself by wearing long sleeves, pants, a wide-brimmed hat, and sunglasses year round, whenever you are outdoors.  Once a month, do a whole-body skin exam, using a mirror to look at the skin on your back. Tell your health care provider about new moles, moles that have irregular borders, moles that are larger than a pencil eraser, or moles that have changed in shape or color.  Stay current with required vaccines (immunizations).  Influenza vaccine. All adults should be immunized every year.  Tetanus, diphtheria, and acellular pertussis (Td, Tdap) vaccine. An adult who has not previously received Tdap or who does not know his vaccine status should receive 1 dose of Tdap. This initial dose should be followed by tetanus and diphtheria toxoids (Td) booster doses every 10 years. Adults with an unknown or incomplete history of completing a 3-dose immunization series with Td-containing vaccines should begin or complete a primary immunization series including a Tdap dose. Adults should receive a Td booster every 10 years.  Varicella vaccine. An adult without evidence of immunity to varicella should receive 2 doses or a second dose if he has previously received 1 dose.  Human papillomavirus (HPV) vaccine. Males aged 13-21 years who have not received the vaccine previously should receive the 3-dose series. Males aged 22-26 years may be immunized. Immunization is recommended through the age of 26 years for any male who has sex with males and did not get any or all doses earlier. Immunization is recommended for any person with an immunocompromised condition through the age of 26 years if he did not get any or all doses earlier. During the 3-dose series, the second dose should be obtained 4-8 weeks after the first dose. The third dose should be obtained 24 weeks after the first dose and 16 weeks after the second dose.  Zoster vaccine. One dose is recommended  for adults aged 60 years or older unless certain conditions are present.    PREVNAR  - Pneumococcal 13-valent conjugate (PCV13) vaccine. When indicated, a person who is uncertain of his immunization history and has no record of immunization should receive the PCV13 vaccine. An adult aged 19 years or older who has certain medical conditions and has not been previously immunized should receive 1 dose of PCV13 vaccine. This PCV13 should be followed with a dose of pneumococcal polysaccharide (PPSV23) vaccine. The PPSV23 vaccine dose should be obtained at least 8 weeks after the dose of PCV13 vaccine. An adult aged 19 years or older who has certain medical conditions and previously   received 1 or more doses of PPSV23 vaccine should receive 1 dose of PCV13. The PCV13 vaccine dose should be obtained 1 or more years after the last PPSV23 vaccine dose.    PNEUMOVAX - Pneumococcal polysaccharide (PPSV23) vaccine. When PCV13 is also indicated, PCV13 should be obtained first. All adults aged 65 years and older should be immunized. An adult younger than age 65 years who has certain medical conditions should be immunized. Any person who resides in a nursing home or long-term care facility should be immunized. An adult smoker should be immunized. People with an immunocompromised condition and certain other conditions should receive both PCV13 and PPSV23 vaccines. People with human immunodeficiency virus (HIV) infection should be immunized as soon as possible after diagnosis. Immunization during chemotherapy or radiation therapy should be avoided. Routine use of PPSV23 vaccine is not recommended for American Indians, [place] Natives, or people younger than 65 years unless there are medical conditions that require PPSV23 vaccine. When indicated, people who have unknown immunization and have no record of immunization should receive PPSV23 vaccine. One-time revaccination 5 years after the first dose of PPSV23 is recommended for  people aged 19-64 years who have chronic kidney failure, nephrotic syndrome, asplenia, or immunocompromised conditions. People who received 1-2 doses of PPSV23 before age 65 years should receive another dose of PPSV23 vaccine at age 65 years or later if at least 5 years have passed since the previous dose. Doses of PPSV23 are not needed for people immunized with PPSV23 at or after age 65 years.    Hepatitis A vaccine. Adults who wish to be protected from this disease, have certain high-risk conditions, work with hepatitis A-infected animals, work in hepatitis A research labs, or travel to or work in countries with a high rate of hepatitis A should be immunized. Adults who were previously unvaccinated and who anticipate close contact with an international adoptee during the first 60 days after arrival in the [place] from a country with a high rate of hepatitis A should be immunized.    Hepatitis B vaccine. Adults should be immunized if they wish to be protected from this disease, have certain high-risk conditions, may be exposed to blood or other infectious body fluids, are household contacts or sex partners of hepatitis B positive people, are clients or workers in certain care facilities, or travel to or work in countries with a high rate of hepatitis B.   Preventive Service / Frequency   Ages 40 to 64  Blood pressure check.  Lipid and cholesterol check  Lung cancer screening. / Every year if you are aged 55-80 years and have a 30-pack-year history of smoking and currently smoke or have quit within the past 15 years. Yearly screening is stopped once you have quit smoking for at least 15 years or develop a health problem that would prevent you from having lung cancer treatment.  Fecal occult blood test (FOBT) of stool. / Every year beginning at age 50 and continuing until age 75. You may not have to do this test if you get a colonoscopy every 10 years.  Flexible sigmoidoscopy** or  colonoscopy.** / Every 5 years for a flexible sigmoidoscopy or every 10 years for a colonoscopy beginning at age 50 and continuing until age 75. Screening for abdominal aortic aneurysm (AAA)  by ultrasound is recommended for people who have history of high blood pressure or who are current or former smokers. 

## 2014-12-02 LAB — URINALYSIS, MICROSCOPIC ONLY
Bacteria, UA: NONE SEEN
Casts: NONE SEEN
Crystals: NONE SEEN
Squamous Epithelial / LPF: NONE SEEN

## 2014-12-02 LAB — PSA: PSA: 0.64 ng/mL (ref <=4.00)

## 2014-12-02 LAB — MICROALBUMIN / CREATININE URINE RATIO
Creatinine, Urine: 36.1 mg/dL
Microalb, Ur: <0.2 mg/dL (ref <2.0)

## 2014-12-05 LAB — TB SKIN TEST
Induration: 0 mm
TB Skin Test: NEGATIVE

## 2014-12-15 ENCOUNTER — Telehealth: Payer: Self-pay | Admitting: Gastroenterology

## 2014-12-15 NOTE — Telephone Encounter (Signed)
Patient having problems with GERD and medications.  He will come in and see Amy Esterwood PA on Wed at 1:30

## 2014-12-17 ENCOUNTER — Ambulatory Visit (INDEPENDENT_AMBULATORY_CARE_PROVIDER_SITE_OTHER): Payer: 59 | Admitting: Physician Assistant

## 2014-12-17 ENCOUNTER — Encounter: Payer: Self-pay | Admitting: Physician Assistant

## 2014-12-17 VITALS — BP 138/82 | HR 60 | Ht 69.5 in | Wt 167.2 lb

## 2014-12-17 DIAGNOSIS — K219 Gastro-esophageal reflux disease without esophagitis: Secondary | ICD-10-CM

## 2014-12-17 DIAGNOSIS — R110 Nausea: Secondary | ICD-10-CM

## 2014-12-17 DIAGNOSIS — R1013 Epigastric pain: Secondary | ICD-10-CM

## 2014-12-17 MED ORDER — OMEPRAZOLE 40 MG PO CPDR: DELAYED_RELEASE_CAPSULE | ORAL | Status: DC

## 2014-12-17 NOTE — Patient Instructions (Addendum)
We sent a prescription to [facility] for Omeprazole 40 mg. Take 1 tab twice daily for one month then go to once daily. Call us for refills when you are close to running out. You have been scheduled for an abdominal ultrasound at [facility] (1st floor of hospital) on Friday [id number], arrive at 7:40 am for an 8Am appointment.  Make certain not to have anything to eat or drink 6 hours prior to your appointment. Should you need to reschedule your appointment, please contact radiology at [phone]. This test typically takes about 30 minutes to perform.  We made you an appointment with Dr Ardis Hughs for 02-18-2015 at 9:15 am.

## 2014-12-17 NOTE — Progress Notes (Signed)
Patient ID: Jay Brooks, male   DOB: [birth year], 54 y.o.   MRN: [id number]   Subjective:    Patient ID: Jay Brooks, male    DOB: 1960-11-23, 54 y.o.   MRN: [id number]  HPI Jay Brooks is a pleasant 54 year old white male known to Dr. Ardis Brooks. He has been seen in the past for chronic GERD. He had colonoscopy in 2012 with finding of diverticulosis only. Is otherwise generally healthy with hypertension hyperlipidemia and chronic rhinitis. He has been taking over-the-counter Prilosec most recently once daily for reflux. He had been in the past on low-dose Prevacid. He did not tolerate Nexium well with side effect of loose stools. He comes in today with complaints of epigastric pain and nausea. He says he is been having problems over the past year with more frequent nausea reflux symptoms and sour brash. He has had recurrent sinus infections as well and feels that the antibiotics may be affecting some of his reflux symptoms. He says he wakes up frequently in the morning nauseated and/or has nausea episodes early on most days. This does not seem to be necessarily postprandially. He also has burning epigastric discomfort. No current dysphagia or odynophagia. Is also currently on doxycycline for Lyme disease and prior to that had finished a course of antibiotics for sinusitis. He had been out of work earlier this week because of the nausea and epigastric discomfort. He is eating but says that his appetite has been decreased.  Review of Systems Pertinent positive and negative review of systems were noted in the above HPI section.  All other review of systems was otherwise negative.  Outpatient Encounter Prescriptions as of 12/17/2014  Medication Sig  . atovaquone-proguanil (MALARONE) 250-100 MG TABS Take 1 tablet by mouth daily. As needed for work  . calcium carbonate (TUMS - DOSED IN MG ELEMENTAL CALCIUM) 500 MG chewable tablet Chew 1 tablet by mouth daily.  Marland Kitchen desonide (DESOWEN) 0.05 % cream   . doxycycline  (VIBRAMYCIN) 100 MG capsule Take 100 mg by mouth 2 (two) times daily.  Marland Kitchen OVER THE COUNTER MEDICATION LB Core 3 capsules 3 times daily  . OVER THE COUNTER MEDICATION Loletta Specter 2 capsules twice daily  . Probiotic Product (PROBIOTIC DAILY PO) Take 1 capsule by mouth daily.  Marland Kitchen testosterone cypionate (DEPOTESTOTERONE CYPIONATE) 200 MG/ML injection USE AS DIRECTED  . [DISCONTINUED] omeprazole (PRILOSEC) 20 MG capsule Take 40 mg by mouth daily.  Marland Kitchen omeprazole (PRILOSEC) 40 MG capsule Take 1 tablet twice daily for one month then go to once daily  . [DISCONTINUED] lansoprazole (PREVACID 24HR) 15 MG capsule Take 15 mg by mouth daily before breakfast.   Allergies  Allergen Reactions  . Allegra [Fexofenadine]   . Benadryl [Diphenhydramine] Other (See Comments)    Insomnia  . Levaquin [Levofloxacin] Nausea And Vomiting    Patient states that after he took levaquin this past time he had nausea with vomiting  . Other     Benzol Peroxide- caused blisters   Patient Active Problem List   Diagnosis Date Noted  . Prediabetes 12/01/2014  . Vitamin D deficiency 12/01/2014  . Hyperlipidemia 10/22/2014  . Encounter for long-term (current) use of medications 10/22/2014  . Labile hypertension 10/22/2014  . Testosterone Deficiency 08/20/2014  . Chronic cough 12/06/2012  . GERD (gastroesophageal reflux disease) 12/06/2012  . Chronic rhinitis 12/06/2012   History   Social History  . Marital Status: Married    Spouse Name: N/A  . Number of Children: 1  . Years of  Education: N/A   Occupational History  . PIL[employer]   Social History Main Topics  . Smoking status: Never Smoker   . Smokeless tobacco: Never Used  . Alcohol Use: Yes     Comment: 3 drinks a month  . Drug Use: No  . Sexual Activity: Not on file   Other Topics Concern  . Not on file   Social History Narrative   Daily caffeine     Jay Brooks family history includes Breast cancer in his maternal grandmother; Cancer (age  of onset: 71) in his mother; Colon polyps in his brother and maternal uncle. There is no history of Colon cancer.      Objective:    Filed Vitals:   12/17/14 1330  BP: 138/82  Pulse: 60    Physical Exam  well-developed white male in no acute distress, pleasant blood pressure 138/82 pulse 60 height 5 foot 9 weight 167. HEENT; nontraumatic normocephalic EOMI PERRLA sclera anicteric, Supple; no JVD, Cardiovascular; regular rate and rhythm with S1-S2 no murmur or gallop, Pulmonary; clear bilaterally, Abdomen; soft , mild epigastric tenderness is no guarding or rebound no palpable mass or hepatosplenomegaly bowel sounds are present, Rectal ;exam not done, Extremities ;no clubbing cyanosis or edema skin warm and dry, Psych ;mood and affect appropriate      Assessment & Plan:   #1   54 yo male with chronic GERD poorly controlled currently with frequent a.m. nausea and intermittent epigastric burning reflux and sour brash. 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 Ardis Brooks in 3-4 weeks Patient asked to have a note for his employer signed and that was done.-He had been out of work for a few days this week      Alfredia Ferguson PA-C 12/17/2014

## 2014-12-18 NOTE — Progress Notes (Signed)
I agree with the above note, plan 

## 2014-12-23 ENCOUNTER — Ambulatory Visit
Admission: RE | Admit: 2014-12-23 | Discharge: 2014-12-23 | Disposition: A | Discharge status: Home / Self Care | Payer: 59 | Source: Ambulatory Visit | Attending: Physician Assistant | Admitting: Physician Assistant

## 2014-12-23 DIAGNOSIS — R1013 Epigastric pain: Secondary | ICD-10-CM

## 2014-12-23 DIAGNOSIS — R110 Nausea: Secondary | ICD-10-CM

## 2014-12-25 ENCOUNTER — Other Ambulatory Visit: Payer: Self-pay | Admitting: *Deleted

## 2014-12-25 ENCOUNTER — Other Ambulatory Visit: Payer: Self-pay | Admitting: Internal Medicine

## 2014-12-25 DIAGNOSIS — Z1212 Encounter for screening for malignant neoplasm of rectum: Secondary | ICD-10-CM

## 2014-12-25 LAB — POC HEMOCCULT BLD/STL (HOME/3-CARD/SCREEN)
Card #2 Fecal Occult Blod, POC: NEGATIVE
Card #3 Fecal Occult Blood, POC: NEGATIVE
Fecal Occult Blood, POC: NEGATIVE

## 2014-12-26 ENCOUNTER — Other Ambulatory Visit: Payer: 59

## 2014-12-26 ENCOUNTER — Ambulatory Visit (INDEPENDENT_AMBULATORY_CARE_PROVIDER_SITE_OTHER): Payer: 59 | Admitting: *Deleted

## 2014-12-26 ENCOUNTER — Encounter: Payer: Self-pay | Admitting: Physician Assistant

## 2014-12-26 DIAGNOSIS — E291 Testicular hypofunction: Secondary | ICD-10-CM

## 2014-12-26 MED ORDER — TESTOSTERONE CYPIONATE 200 MG/ML IM SOLN
300.0000 mg | Freq: Once | INTRAMUSCULAR | Status: AC
  Administered 2014-12-26: 300 mg via INTRAMUSCULAR

## 2014-12-26 NOTE — Progress Notes (Signed)
Patient ID: Jay Brooks, male   DOB: [birth year], 54 y.o.   MRN: [id number] Patient presents for testosterone injection for hypogonadism Tx.  Patient received 1.5 cc IM left glut and tolerated well.

## 2014-12-30 ENCOUNTER — Encounter: Payer: Self-pay | Admitting: Internal Medicine

## 2014-12-30 ENCOUNTER — Ambulatory Visit (INDEPENDENT_AMBULATORY_CARE_PROVIDER_SITE_OTHER): Payer: 59 | Admitting: Internal Medicine

## 2014-12-30 VITALS — BP 116/78 | HR 68 | Temp 97.5°F | Resp 16 | Ht 69.75 in | Wt 169.6 lb

## 2014-12-30 DIAGNOSIS — M791 Myalgia, unspecified site: Secondary | ICD-10-CM

## 2014-12-30 DIAGNOSIS — R5383 Other fatigue: Secondary | ICD-10-CM

## 2014-12-30 DIAGNOSIS — M2550 Pain in unspecified joint: Secondary | ICD-10-CM

## 2014-12-30 LAB — CBC WITH DIFFERENTIAL/PLATELET
Basophils Absolute: 0 10*3/uL (ref 0.0–0.1)
Basophils Relative: 0 % (ref 0–1)
Eosinophils Absolute: 0.1 10*3/uL (ref 0.0–0.7)
Eosinophils Relative: 1 % (ref 0–5)
HCT: 46.6 % (ref 39.0–52.0)
Hemoglobin: 16.5 g/dL (ref 13.0–17.0)
Lymphocytes Relative: 24 % (ref 12–46)
Lymphs Abs: 1.2 10*3/uL (ref 0.7–4.0)
MCH: 32 pg (ref 26.0–34.0)
MCHC: 35.4 g/dL (ref 30.0–36.0)
MCV: 90.3 fL (ref 78.0–100.0)
MPV: 9.3 fL (ref 8.6–12.4)
Monocytes Absolute: 0.7 10*3/uL (ref 0.1–1.0)
Monocytes Relative: 13 % — ABNORMAL HIGH (ref 3–12)
Neutro Abs: 3.2 10*3/uL (ref 1.7–7.7)
Neutrophils Relative %: 62 % (ref 43–77)
Platelets: 176 10*3/uL (ref 150–400)
RBC: 5.16 MIL/uL (ref 4.22–5.81)
RDW: 13.2 % (ref 11.5–15.5)
WBC: 5.2 10*3/uL (ref 4.0–10.5)

## 2014-12-30 LAB — RHEUMATOID FACTOR: Rhuematoid fact SerPl-aCnc: <10 IU/mL (ref <=14)

## 2014-12-30 LAB — CK: Total CK: 119 U/L (ref 7–232)

## 2014-12-30 LAB — SEDIMENTATION RATE: Sed Rate: 1 mm/hr (ref 0–20)

## 2014-12-30 LAB — C-REACTIVE PROTEIN: CRP: <0.5 mg/dL (ref <0.60)

## 2014-12-30 NOTE — Progress Notes (Signed)
Subjective:    Patient ID: Jay Brooks, male    DOB: 04/09/1961, 54 y.o.   MRN: [id number]  HPI patient presents with chronic c/o Fatigue -  pain under eyes -  wakes up exhausted - has longstanding chronic insomnia with Sleep studies x2 showing poor sleep hygiene, but no RLS or  OSA. C/O burning"muscles" in Upper limb girdle musculature sometimes lasting up to 20-45 min and migrating and vacillating. Had labs doe at "clinic" in [place] which were unremarkable .    Medication Sig  . atovaquone-proguanil (MALARONE) 250-100 MG TABS Take 1 tablet by mouth daily. As needed for work  . calcium carbonate (TUMS - DOSED IN MG ELEMENTAL CALCIUM) 500 MG chewable tablet Chew 1 tablet by mouth daily.  Marland Kitchen desonide (DESOWEN) 0.05 % cream   . omeprazole (PRILOSEC) 40 MG capsule Take 1 tablet twice daily for one month then go to once daily  . OVER THE COUNTER MEDICATION LB Core 3 capsules 3 times daily  . OVER THE COUNTER MEDICATION Loletta Specter 2 capsules twice daily  . Probiotic Product (PROBIOTIC DAILY PO) Take 1 capsule by mouth daily.  Marland Kitchen testosterone cypionate (DEPOTESTOTERONE CYPIONATE) 200 MG/ML injection USE AS DIRECTED  . doxycycline (VIBRAMYCIN) 100 MG capsule Take 100 mg by mouth 2 (two) times daily.   Allergies  Allergen Reactions  . Allegra [Fexofenadine]   . Benadryl [Diphenhydramine] Other (See Comments)    Insomnia  . Doxycycline Nausea Only  . Levaquin [Levofloxacin] Nausea And Vomiting    Patient states that after he took levaquin this past time he had nausea with vomiting  . Other     Benzol Peroxide- caused blisters   Past Medical History  Diagnosis Date  . Chronic sinusitis   . Sleep deficient   . Diverticulosis of colon (without mention of hemorrhage)   . Lyme disease (DOUBT after review of Negative labs)    Review of Systems Pertinent (+)as above / otherwise negative x 10 pt sys review.    Objective:   Physical Exam  BP 116/78 mmHg  Pulse 68  Temp(Src) 97.5 F (36.4  C)  Resp 16  Ht 5' 9.75" (1.772 m)  Wt 169 lb 9.6 oz (76.93 kg)  BMI 24.50 kg/m2  HEENT - Eac's patent. TM's Nl. EOM's full. PERRLA. NasoOroPharynx clear. Neck - supple. Nl Thyroid. Carotids 2+ & No bruits, nodes, JVD Chest - Clear equal BS w/o Rales, rhonchi, wheezes. Cor - Nl HS. RRR w/o sig MGR. PP 1(+). No edema. Abd - No palpable organomegaly, masses or tenderness. BS nl. MS- FROM w/o deformities. Muscle power, tone and bulk Nl. Gait Nl. Neuro - No obvious Cr N abnormalities. Sensory, motor and Cerebellar functions appear Nl w/o focal abnormalities. Psyche - Mental status normal & appropriate.  No delusions, ideations or obvious mood abnormalities. Skin - WNL - No lesions, rash, cyanosis, icterus or clubbing.     Assessment & Plan:   1. Other fatigue  - CBC with Differential/Platelet  - Cortisol - ( Had elevated level on outside labs).  2. Muscle pain  - Sedimentation rate - Lyme Aby, Wstrn. Blt. IgG & IgM w/bands - CK - Aldolase  3. Joint pain  - r/o Connective tissue disorder  - Sedimentation rate - Anti-DNA antibody, double-stranded - ANA - C3 and C4 - Cyclic citrul peptide antibody, IgG - C-reactive protein - Complement, total - Lyme Aby, Wstrn. Blt. IgG & IgM w/bands - ANCA screen with reflex titer - Rheumatoid factor  -  Disposition  pending results

## 2014-12-30 NOTE — Patient Instructions (Signed)

## 2014-12-31 LAB — C3 AND C4
C3 Complement: 111 mg/dL (ref 90–180)
C4 Complement: 18 mg/dL (ref 10–40)

## 2014-12-31 LAB — CORTISOL: Cortisol, Plasma: 14.9 ug/dL

## 2014-12-31 LAB — ANTI-DNA ANTIBODY, DOUBLE-STRANDED: ds DNA Ab: 1 IU/mL

## 2014-12-31 LAB — ANA: Anti Nuclear Antibody(ANA): NEGATIVE

## 2015-01-01 LAB — ANCA SCREEN W REFLEX TITER
Atypical p-ANCA Screen: NEGATIVE
c-ANCA Screen: NEGATIVE
p-ANCA Screen: NEGATIVE

## 2015-01-01 LAB — CYCLIC CITRUL PEPTIDE ANTIBODY, IGG: Cyclic Citrullin Peptide Ab: <2 U/mL (ref 0.0–5.0)

## 2015-01-01 LAB — COMPLEMENT, TOTAL: Compl, Total (CH50): 51 U/mL (ref 31–60)

## 2015-01-02 LAB — LYME ABY, WSTRN BLT IGG & IGM W/BANDS

## 2015-01-02 LAB — ALDOLASE: Aldolase: 5.1 U/L (ref <=8.1)

## 2015-01-03 ENCOUNTER — Other Ambulatory Visit: Payer: Self-pay | Admitting: Internal Medicine

## 2015-01-21 ENCOUNTER — Other Ambulatory Visit: Payer: Self-pay | Admitting: Physician Assistant

## 2015-02-18 ENCOUNTER — Other Ambulatory Visit: Payer: Self-pay | Admitting: *Deleted

## 2015-02-18 ENCOUNTER — Encounter: Payer: Self-pay | Admitting: Gastroenterology

## 2015-02-18 ENCOUNTER — Ambulatory Visit (INDEPENDENT_AMBULATORY_CARE_PROVIDER_SITE_OTHER): Payer: 59 | Admitting: Gastroenterology

## 2015-02-18 ENCOUNTER — Telehealth: Payer: Self-pay | Admitting: *Deleted

## 2015-02-18 VITALS — BP 120/80 | HR 60 | Ht 69.5 in | Wt 163.5 lb

## 2015-02-18 DIAGNOSIS — K219 Gastro-esophageal reflux disease without esophagitis: Secondary | ICD-10-CM | POA: Diagnosis not present

## 2015-02-18 MED ORDER — MOXIFLOXACIN HCL 400 MG PO TABS: 400.0000 mg | ORAL_TABLET | Freq: Every day | ORAL | Status: DC

## 2015-02-18 MED ORDER — PREDNISONE 20 MG PO TABS: ORAL_TABLET | ORAL | Status: DC

## 2015-02-18 NOTE — Progress Notes (Signed)
Review of pertinent gastrointestinal problems: 1. Routine risk for crc, colonoscopy 12/2010 found diverticulosis only, no polyps. Recommended recall colonoscopy in 10 years. 2. GERD: 6/14 EGD Dr. Ardis Hughs found mild non specific gastritis. Biopsies neg for H. Pylori. 2016 US done for dyspepsia, was normal.  HPI: This is a  very pleasant 54 year old man whom I last saw year 2 ago.  He is a Insurance underwriter for [employer]  He took bid prilosec for a month. On twice a day his symptoms were much relieved however he felt his insomnia was worse. That is not a typical side effect. He did have some rebound worsening of his GERD when he bent went back to once daily.     Currently 40 mg prilosec before BF.  He noticed improvement with nausea but it is not gone completely.       Past Medical History  Diagnosis Date  . Chronic sinusitis   . Sleep deficient   . Diverticulosis of colon (without mention of hemorrhage)   . Lyme disease     Past Surgical History  Procedure Laterality Date  . Hernia repair      Current Outpatient Prescriptions  Medication Sig Dispense Refill  . atovaquone-proguanil (MALARONE) 250-100 MG TABS Take 1 tablet by mouth daily. As needed for work    . calcium carbonate (TUMS - DOSED IN MG ELEMENTAL CALCIUM) 500 MG chewable tablet Chew 1 tablet by mouth daily.    Marland Kitchen desonide (DESOWEN) 0.05 % cream     . omeprazole (PRILOSEC) 40 MG capsule TAKE 1 TABLET TWICE DAILY FOR ONE MONTH THEN GO TO ONCE DAILY 60 capsule 0  . OVER THE COUNTER MEDICATION LB Core 3 capsules 3 times daily    . OVER THE COUNTER MEDICATION Loletta Specter 2 capsules twice daily    . Probiotic Product (PROBIOTIC DAILY PO) Take 1 capsule by mouth daily.    Marland Kitchen testosterone cypionate (DEPOTESTOTERONE CYPIONATE) 200 MG/ML injection USE AS DIRECTED 10 mL 0   No current facility-administered medications for this visit.    Allergies as of 02/18/2015 - Review Complete 02/18/2015  Allergen Reaction Noted  . Allegra  [fexofenadine]  10/22/2014  . Benadryl [diphenhydramine] Other (See Comments) 10/22/2014  . Doxycycline Nausea Only 12/30/2014  . Levaquin [levofloxacin] Nausea And Vomiting 09/08/2014  . Other  12/06/2012    Family History  Problem Relation Age of Onset  . Breast cancer Maternal Grandmother   . Colon cancer Neg Hx   . Colon polyps Brother   . Colon polyps Maternal Uncle   . Cancer Mother 46    pancreatic    History   Social History  . Marital Status: Married    Spouse Name: N/A  . Number of Children: 1  . Years of Education: N/A   Occupational History  . PIL[employer]   Social History Main Topics  . Smoking status: Never Smoker   . Smokeless tobacco: Never Used  . Alcohol Use: Yes     Comment: 3 drinks a month  . Drug Use: No  . Sexual Activity: Not on file   Other Topics Concern  . Not on file   Social History Narrative   Daily caffeine       Physical Exam: BP 120/80 mmHg  Pulse 60  Ht 5' 9.5" (1.765 m)  Wt 163 lb 8 oz (74.163 kg)  BMI 23.81 kg/m2 Constitutional: generally well-appearing Psychiatric: alert and oriented x3 Abdomen: soft, nontender, nondistended, no obvious ascites, no peritoneal signs, normal bowel sounds  Assessment and plan: 54 y.o. male with chronic GERD  he also mentioned chronic difficulty with sinus infections. These are not commonly caused by GERD. He does see ENT for them. He is still somewhat bothered by nausea especially in the evening. I recommended he try H2 blocker at dinnertime and continue his morning proton pump inhibitor. He'll return to see me on an as-needed basis.

## 2015-02-18 NOTE — Patient Instructions (Signed)
Continue prilosec 40mg  once daily, shortly before BF meal. Add ranitidine 150mg  pill, take one pill with dinner nightly. Call if any other issues.

## 2015-02-18 NOTE — Telephone Encounter (Signed)
Patient called and states he has had sinus pressure, sore throat, headache and cough x 3 days.  Patient has tried OTC meds without relief.  OK to send in RX for Levaquin and Prednisone per Dr Melford Aase.

## 2015-02-27 ENCOUNTER — Other Ambulatory Visit: Payer: Self-pay | Admitting: Physician Assistant

## 2015-02-27 ENCOUNTER — Ambulatory Visit (INDEPENDENT_AMBULATORY_CARE_PROVIDER_SITE_OTHER): Payer: 59 | Admitting: *Deleted

## 2015-02-27 DIAGNOSIS — E291 Testicular hypofunction: Secondary | ICD-10-CM

## 2015-02-27 MED ORDER — TESTOSTERONE CYPIONATE 200 MG/ML IM SOLN
300.0000 mg | Freq: Once | INTRAMUSCULAR | Status: AC
  Administered 2015-02-27: 300 mg via INTRAMUSCULAR

## 2015-02-27 NOTE — Progress Notes (Signed)
Patient ID: Jay Brooks, male   DOB: [birth year], 54 y.o.   MRN: [id number] Patient presents for testosterone injection.  Patient received 1.5 cc IM right glute.  Patient tolerated well.

## 2015-04-09 ENCOUNTER — Other Ambulatory Visit: Payer: Self-pay

## 2015-04-09 ENCOUNTER — Ambulatory Visit (INDEPENDENT_AMBULATORY_CARE_PROVIDER_SITE_OTHER): Payer: 59

## 2015-04-09 DIAGNOSIS — E291 Testicular hypofunction: Secondary | ICD-10-CM

## 2015-04-09 MED ORDER — TESTOSTERONE CYPIONATE 200 MG/ML IM SOLN
400.0000 mg | Freq: Once | INTRAMUSCULAR | Status: AC
  Administered 2015-04-09: 400 mg via INTRAMUSCULAR

## 2015-04-09 MED ORDER — TESTOSTERONE CYPIONATE 200 MG/ML IM SOLN: INTRAMUSCULAR | Status: DC

## 2015-04-09 NOTE — Progress Notes (Signed)
Patient ID: Jay Brooks, male   DOB: 11/04/1961, 54 y.o.   MRN: [id number] Patient here today for testosterone injection. Patient received 1.5 mL IM left glut. Patient tolerated well. Patient advised out of medication and will need to bring medication for next injection. Called RX to pharmacy for patient.

## 2015-05-05 ENCOUNTER — Ambulatory Visit (INDEPENDENT_AMBULATORY_CARE_PROVIDER_SITE_OTHER): Payer: 59 | Admitting: *Deleted

## 2015-05-05 DIAGNOSIS — E291 Testicular hypofunction: Secondary | ICD-10-CM

## 2015-05-05 MED ORDER — TESTOSTERONE CYPIONATE 200 MG/ML IM SOLN
300.0000 mg | Freq: Once | INTRAMUSCULAR | Status: AC
  Administered 2015-05-05: 300 mg via INTRAMUSCULAR

## 2015-05-05 NOTE — Progress Notes (Signed)
Patient given Testosterone injection 1.5 ml to equal 300 mg in right upper outer quadrant.  Patient tolerated well.

## 2015-05-18 ENCOUNTER — Other Ambulatory Visit: Payer: Self-pay | Admitting: Physician Assistant

## 2015-06-02 ENCOUNTER — Encounter: Payer: Self-pay | Admitting: Physician Assistant

## 2015-06-02 ENCOUNTER — Ambulatory Visit (INDEPENDENT_AMBULATORY_CARE_PROVIDER_SITE_OTHER): Payer: 59 | Admitting: Physician Assistant

## 2015-06-02 VITALS — BP 130/84 | HR 64 | Temp 97.1°F | Resp 16 | Ht 69.75 in | Wt 163.6 lb

## 2015-06-02 DIAGNOSIS — E559 Vitamin D deficiency, unspecified: Secondary | ICD-10-CM

## 2015-06-02 DIAGNOSIS — R7303 Prediabetes: Secondary | ICD-10-CM

## 2015-06-02 DIAGNOSIS — I10 Essential (primary) hypertension: Secondary | ICD-10-CM

## 2015-06-02 DIAGNOSIS — E291 Testicular hypofunction: Secondary | ICD-10-CM

## 2015-06-02 DIAGNOSIS — K219 Gastro-esophageal reflux disease without esophagitis: Secondary | ICD-10-CM

## 2015-06-02 DIAGNOSIS — R7309 Other abnormal glucose: Secondary | ICD-10-CM

## 2015-06-02 DIAGNOSIS — Z79899 Other long term (current) drug therapy: Secondary | ICD-10-CM

## 2015-06-02 DIAGNOSIS — E785 Hyperlipidemia, unspecified: Secondary | ICD-10-CM

## 2015-06-02 DIAGNOSIS — R0989 Other specified symptoms and signs involving the circulatory and respiratory systems: Secondary | ICD-10-CM

## 2015-06-02 MED ORDER — SUVOREXANT 20 MG PO TABS: 20.0000 mg | ORAL_TABLET | Freq: Every day | ORAL | Status: DC

## 2015-06-02 MED ORDER — TESTOSTERONE CYPIONATE 200 MG/ML IM SOLN
300.0000 mg | Freq: Once | INTRAMUSCULAR | Status: AC
  Administered 2015-06-02: 300 mg via INTRAMUSCULAR

## 2015-06-02 NOTE — Progress Notes (Signed)
Assessment and Plan:  1. Hypertension -Continue medication, monitor blood pressure at home. Continue DASH diet.  Reminder to go to the ER if any CP, SOB, nausea, dizziness, severe HA, changes vision/speech, left arm numbness and tingling and jaw pain.  2. Cholesterol -Continue diet and exercise. Check cholesterol.   3. Prediabetes  -Continue diet and exercise. Check A1C  4. Vitamin D Def - check level and continue medications.   5. Hypogonadism - continue replacement therapy, check testosterone levels as needed.   6. Insomnia Will give samples of belsomra, never gave a good trial period last time. .   Future Appointments Date Time Provider [place]  12/15/2015 3:00 PM Unk Pinto, MD [facility] None    Continue diet and meds as discussed. Further disposition pending results of labs. Over 30 minutes of exam, counseling, chart review, and critical decision making was performed  HPI 54 y.o. male  presents for 3 month follow up on hypertension, cholesterol, prediabetes, and vitamin D deficiency.   His blood pressure has been controlled at home, today their BP is BP: 130/84 mmHg  He does workout, 2-3 x a week. He denies chest pain, shortness of breath, dizziness.  He is not on cholesterol medication and denies myalgias. His cholesterol is at goal. The cholesterol last visit was:   Lab Results  Component Value Date   CHOL 136 10/22/2014   HDL 38* 10/22/2014   LDLCALC 68 10/22/2014   TRIG 151* 10/22/2014   CHOLHDL 3.6 10/22/2014    He has been working on diet and exercise for prediabetes, and denies paresthesia of the feet, polydipsia, polyuria and visual disturbances. Last A1C in the office was:  Lab Results  Component Value Date   HGBA1C 5.2 10/22/2014   Patient is on Vitamin D supplement.   Lab Results  Component Value Date   VD25OH 47 10/22/2014     He has a history of testosterone deficiency and is on testosterone replacement, 1.5cc q 3 weeks, due today. He  states that the testosterone helps with his energy, libido, muscle mass. Lab Results  Component Value Date   TESTOSTERONE 799 10/22/2014    Current Medications:  Current Outpatient Prescriptions on File Prior to Visit  Medication Sig Dispense Refill  . atovaquone-proguanil (MALARONE) 250-100 MG TABS Take 1 tablet by mouth daily. As needed for work    . calcium carbonate (TUMS - DOSED IN MG ELEMENTAL CALCIUM) 500 MG chewable tablet Chew 1 tablet by mouth daily.    Marland Kitchen desonide (DESOWEN) 0.05 % cream     . omeprazole (PRILOSEC) 40 MG capsule TAKE 1 CAPSULE TWICE DAILY FOR ONE MONTH THEN GO TO ONCE DAILY 60 capsule 0  . OVER THE COUNTER MEDICATION LB Core 3 capsules 3 times daily    . OVER THE COUNTER MEDICATION Loletta Specter 2 capsules twice daily    . Probiotic Product (PROBIOTIC DAILY PO) Take 1 capsule by mouth daily.    Marland Kitchen testosterone cypionate (DEPOTESTOTERONE CYPIONATE) 200 MG/ML injection Inject 2 mLs IM every 2 weeks or as directed 10 mL 5   No current facility-administered medications on file prior to visit.   Medical History:  Past Medical History  Diagnosis Date  . Chronic sinusitis   . Sleep deficient   . Diverticulosis of colon (without mention of hemorrhage)   . Lyme disease    Allergies:  Allergies  Allergen Reactions  . Allegra [Fexofenadine]   . Benadryl [Diphenhydramine] Other (See Comments)    Insomnia  . Doxycycline Nausea  Only  . Levaquin [Levofloxacin] Nausea And Vomiting    Patient states that after he took levaquin this past time he had nausea with vomiting  . Other     Benzol Peroxide- caused blisters     Review of Systems:  Review of Systems  Constitutional: Negative.   HENT: Negative.   Eyes: Negative.   Respiratory: Negative.   Cardiovascular: Negative.   Gastrointestinal: Negative.   Genitourinary: Negative.   Musculoskeletal: Negative.   Skin: Negative.   Neurological: Negative.   Endo/Heme/Allergies: Negative.   Psychiatric/Behavioral:  Negative for depression, suicidal ideas, hallucinations, memory loss and substance abuse. The patient has insomnia. The patient is not nervous/anxious.     Family history- Review and unchanged Social history- Review and unchanged Physical Exam: BP 130/84 mmHg  Pulse 64  Temp(Src) 97.1 F (36.2 C)  Resp 16  Ht 5' 9.75" (1.772 m)  Wt 163 lb 9.6 oz (74.208 kg)  BMI 23.63 kg/m2 Wt Readings from Last 3 Encounters:  06/02/15 163 lb 9.6 oz (74.208 kg)  02/18/15 163 lb 8 oz (74.163 kg)  12/30/14 169 lb 9.6 oz (76.93 kg)   General Appearance: Well nourished, in no apparent distress. Eyes: PERRLA, EOMs, conjunctiva no swelling or erythema Sinuses: No Frontal/maxillary tenderness ENT/Mouth: Ext aud canals clear, TMs without erythema, bulging. No erythema, swelling, or exudate on post pharynx.  Tonsils not swollen or erythematous. Hearing normal.  Neck: Supple, thyroid normal.  Respiratory: Respiratory effort normal, BS equal bilaterally without rales, rhonchi, wheezing or stridor.  Cardio: RRR with no MRGs. Brisk peripheral pulses without edema.  Abdomen: Soft, + BS,  Non tender, no guarding, rebound, hernias, masses. Lymphatics: Non tender without lymphadenopathy.  Musculoskeletal: Full ROM, 5/5 strength, Normal gait Skin: Warm, dry without rashes, lesions, ecchymosis.  Neuro: Cranial nerves intact. Normal muscle tone, no cerebellar symptoms. Psych: Awake and oriented X 3, normal affect, Insight and Judgment appropriate.    Vicie Mutters, PA-C 11:52 AM [facility]

## 2015-06-02 NOTE — Patient Instructions (Addendum)
Belsomra is a prescription for insomnia. It targets orexin, a neurotransmitter that signals your brain to be awake, it quiets this action allowing you to sleep.   It is important to give Belsomra an adequate trail period. Try Belsomra a few nights to a week and see how it works for you. It comes in multiple strengths so if you are not sleeping well on the 10mg , we can increase your dose of Belsomra to 15 or 20 mg.  Do not take more than 20 mg of Belsomra in 1 day.   Take Belsomra 30 mins before going to bed.  How should I use this medicine? Take this medicine by mouth within 30 minutes of going to bed. Do not take it unless you are able to stay in bed a full night before you must be active again. Follow the directions on the prescription label. For best results, it is better to take this medicine on an empty stomach. Do not take your medicine more often than directed. Do not stop taking this medicine on your own. Always follow your doctor or health care professional's advice. What if I miss a dose? This medicine should only be taken immediately before going to sleep. Do not take double or extra doses. What should I watch for while using this medicine? Visit your doctor or health care professional for regular checks on your progress. Keep a regular sleep schedule by going to bed at about the same time each night. Avoid caffeine-containing drinks in the evening hours. When sleep medicines are used every night for more than a few weeks, they may stop working. Do not increase the dose on your own. Talk to your doctor if your insomnia worsens or is not better within 7 to 10 days. You may not be able to remember things that you do in the hours after you take this medicine. Some people have reported driving, making phone calls, or preparing and eating food while asleep after taking sleep medicine. Take this medicine right before going to sleep. Tell your doctor if you are having any problems with your  memory. Do not take this medicine unless you are able to stay in bed for a full night (7 to 8 hours) before you must be active again and do not drive or perform other activities requiring full alertness within 8 hours of a dose. Do not drive, use machinery, or do anything that needs mental alertness the day after you take the 20 mg dose of this medicine. The use of lower doses (10 mg) also has the potential to cause driving impairment the next day. You may have a decrease in mental alertness the day after use, even if you feel that you are fully awake. Tell your doctor if you will need to perform activities requiring full alertness, such as driving, the next day. You may get drowsy or dizzy. Do not stand or sit up quickly, especially if you are an older patient. This reduces the risk of dizzy or fainting spells. Alcohol may interfere with the effect of this medicine. Avoid alcoholic drinks. Do not use this medicine if you have had alcohol that evening or before bed. If you or your family notice any changes in your behavior, or if you have any unusual or disturbing thoughts such as depression or suicidal thoughts, call your doctor right away.  Also please remember it is important to still have good sleep hygiene. Here is some information below.   Using relaxation techniques that help with  breathing and reduce muscle tension.  Exercising earlier in the day.  Changing your diet and the time of your last meal. No night time snacks.  Establish a regular time to go to bed.  Counseling can help with stressful problems and worry.  Soothing music and white noise may be helpful if there are background noises you cannot remove.  Stop tedious detailed work at least one hour before bedtime.  Get out of bed if you are still awake after 15 minutes. Read or do some quiet activity. Keep the lights down. Wait until you feel sleepy and go back to bed.  Keep regular sleeping and waking hours. Avoid  naps.  Exercise regularly.  Avoid distractions at bedtime. Distractions include watching television or engaging in any intense or detailed activity like attempting to balance the household checkbook.  Develop a bedtime ritual. Keep a familiar routine of bathing, brushing your teeth, climbing into bed at the same time each night, listening to soothing music. Routines increase the success of falling to sleep faster.  Use relaxation techniques. This can be using breathing and muscle tension release routines. It can also include visualizing peaceful scenes. You can also help control troubling or intruding thoughts by keeping your mind occupied with boring or repetitive thoughts like the old concept of counting sheep. You can make it more creative like imagining planting one beautiful flower after another in your backyard garden.  During your day, work to eliminate stress. When this is not possible use some of the previous suggestions to help reduce the anxiety that accompanies stressful situations.     GETTING OFF OF PPI's    Nexium/protonix/prilosec/Omeprazole/Dexilant/Aciphex are called PPI's, they are great at healing your stomach but should only be taken for a short period of time.     Recent studies have shown that taken for a long time they  can increase the risk of osteoporosis (weakening of your bones), pneumonia, low magnesium, restless legs, Cdiff (infection that causes diarrhea), DEMENTIA and most recently kidney damage / disease / insufficiency.     Due to this information we want to try to stop the PPI but if you try to stop it abruptly this can cause rebound acid and worsening symptoms.   So this is how we want you to get off the PPI:  - Start taking the nexium/protonix/prilosec/PPI  every other day with  zantac (ranitidine) 2 x a day for 2-4 weeks  - then decrease the PPI to every 3 days while taking the zantac (ranitidine) twice a day the other  days for 2-4  Weeks  - then  you can try the zantac (ranitidine) once at night or up to 2 x day as needed.  - you can continue on this once at night or stop all together  - Avoid alcohol, spicy foods, NSAIDS (aleve, ibuprofen) at this time. See foods below.   +++++++++++++++++++++++++++++++++++++++++++  Food Choices for Gastroesophageal Reflux Disease  When you have gastroesophageal reflux disease (GERD), the foods you eat and your eating habits are very important. Choosing the right foods can help ease the discomfort of GERD. WHAT GENERAL GUIDELINES DO I NEED TO FOLLOW?  Choose fruits, vegetables, whole grains, low-fat dairy products, and low-fat meat, fish, and poultry.  Limit fats such as oils, salad dressings, butter, nuts, and avocado.  Keep a food diary to identify foods that cause symptoms.  Avoid foods that cause reflux. These may be different for different people.  Eat frequent small meals instead of three  large meals each day.  Eat your meals slowly, in a relaxed setting.  Limit fried foods.  Cook foods using methods other than frying.  Avoid drinking alcohol.  Avoid drinking large amounts of liquids with your meals.  Avoid bending over or lying down until 2-3 hours after eating.   WHAT FOODS ARE NOT RECOMMENDED? The following are some foods and drinks that may worsen your symptoms:  Vegetables Tomatoes. Tomato juice. Tomato and spaghetti sauce. Chili peppers. Onion and garlic. Horseradish. Fruits Oranges, grapefruit, and lemon (fruit and juice). Meats High-fat meats, fish, and poultry. This includes hot dogs, ribs, ham, sausage, salami, and bacon. Dairy Whole milk and chocolate milk. Sour cream. Cream. Butter. Ice cream. Cream cheese.  Beverages Coffee and tea, with or without caffeine. Carbonated beverages or energy drinks. Condiments Hot sauce. Barbecue sauce.  Sweets/Desserts Chocolate and cocoa. Donuts. Peppermint and spearmint. Fats and Oils High-fat foods, including [place]  fries and potato chips. Other Vinegar. Strong spices, such as black pepper, white pepper, red pepper, cayenne, curry powder, cloves, ginger, and chili powder. Nexium/protonix/prilosec are called PPI's, they are great at healing your stomach but should only be taken for a short period of time.

## 2015-06-03 LAB — CBC WITH DIFFERENTIAL/PLATELET
Basophils Absolute: 0 10*3/uL (ref 0.0–0.1)
Basophils Relative: 0 % (ref 0–1)
Eosinophils Absolute: 0.1 10*3/uL (ref 0.0–0.7)
Eosinophils Relative: 1 % (ref 0–5)
HCT: 46.1 % (ref 39.0–52.0)
Hemoglobin: 16.2 g/dL (ref 13.0–17.0)
Lymphocytes Relative: 25 % (ref 12–46)
Lymphs Abs: 1.4 10*3/uL (ref 0.7–4.0)
MCH: 32.3 pg (ref 26.0–34.0)
MCHC: 35.1 g/dL (ref 30.0–36.0)
MCV: 91.8 fL (ref 78.0–100.0)
MPV: 9 fL (ref 8.6–12.4)
Monocytes Absolute: 0.7 10*3/uL (ref 0.1–1.0)
Monocytes Relative: 13 % — ABNORMAL HIGH (ref 3–12)
Neutro Abs: 3.4 10*3/uL (ref 1.7–7.7)
Neutrophils Relative %: 61 % (ref 43–77)
Platelets: 167 10*3/uL (ref 150–400)
RBC: 5.02 MIL/uL (ref 4.22–5.81)
RDW: 13.1 % (ref 11.5–15.5)
WBC: 5.6 10*3/uL (ref 4.0–10.5)

## 2015-06-03 LAB — BASIC METABOLIC PANEL WITH GFR
BUN: 19 mg/dL (ref 7–25)
CO2: 25 mEq/L (ref 20–31)
Calcium: 9.4 mg/dL (ref 8.6–10.3)
Chloride: 104 mEq/L (ref 98–110)
Creat: 1.03 mg/dL (ref 0.70–1.33)
GFR, Est African American: >89 mL/min (ref >=60)
GFR, Est Non African American: 81 mL/min (ref >=60)
Glucose, Bld: 91 mg/dL (ref 65–99)
Potassium: 4.3 mEq/L (ref 3.5–5.3)
Sodium: 138 mEq/L (ref 135–146)

## 2015-06-03 LAB — HEPATIC FUNCTION PANEL
ALT: 17 U/L (ref 9–46)
AST: 25 U/L (ref 10–35)
Albumin: 4.5 g/dL (ref 3.6–5.1)
Alkaline Phosphatase: 38 U/L — ABNORMAL LOW (ref 40–115)
Bilirubin, Direct: 0.1 mg/dL (ref <=0.2)
Indirect Bilirubin: 0.6 mg/dL (ref 0.2–1.2)
Total Bilirubin: 0.7 mg/dL (ref 0.2–1.2)
Total Protein: 7.3 g/dL (ref 6.1–8.1)

## 2015-06-03 LAB — TSH: TSH: 2.827 u[IU]/mL (ref 0.350–4.500)

## 2015-06-03 LAB — LIPID PANEL
Cholesterol: 140 mg/dL (ref 125–200)
HDL: 38 mg/dL — ABNORMAL LOW (ref >=40)
LDL Cholesterol: 70 mg/dL (ref <130)
Total CHOL/HDL Ratio: 3.7 Ratio (ref <=5.0)
Triglycerides: 161 mg/dL — ABNORMAL HIGH (ref <150)
VLDL: 32 mg/dL — ABNORMAL HIGH (ref <30)

## 2015-06-03 LAB — MAGNESIUM: Magnesium: 2 mg/dL (ref 1.5–2.5)

## 2015-06-03 LAB — TESTOSTERONE: Testosterone: 125 ng/dL — ABNORMAL LOW (ref 300–890)

## 2015-06-03 LAB — HEMOGLOBIN A1C
Hgb A1c MFr Bld: 5.2 % (ref <5.7)
Mean Plasma Glucose: 103 mg/dL (ref <117)

## 2015-06-03 LAB — INSULIN, FASTING: Insulin fasting, serum: 3 u[IU]/mL (ref 2.0–19.6)

## 2015-06-04 ENCOUNTER — Ambulatory Visit: Payer: Self-pay | Admitting: Physician Assistant

## 2015-06-04 LAB — VITAMIN D 25 HYDROXY (VIT D DEFICIENCY, FRACTURES): Vit D, 25-Hydroxy: 34 ng/mL (ref 30–100)

## 2015-06-15 ENCOUNTER — Other Ambulatory Visit: Payer: Self-pay | Admitting: Physician Assistant

## 2015-06-25 ENCOUNTER — Ambulatory Visit (INDEPENDENT_AMBULATORY_CARE_PROVIDER_SITE_OTHER): Payer: 59

## 2015-06-25 DIAGNOSIS — E291 Testicular hypofunction: Secondary | ICD-10-CM | POA: Diagnosis not present

## 2015-06-25 MED ORDER — TESTOSTERONE CYPIONATE 200 MG/ML IM SOLN
300.0000 mg | INTRAMUSCULAR | Status: DC
  Administered 2015-06-25 – 2015-07-17 (×2): 300 mg via INTRAMUSCULAR

## 2015-06-25 NOTE — Progress Notes (Signed)
Patient ID: Jay Brooks, male   DOB: [birth year], 54 y.o.   MRN: [id number] Patient presents for testosterone injection. Patient received 1.5 cc IM left glute. Patient tolerated well.

## 2015-07-17 ENCOUNTER — Ambulatory Visit (INDEPENDENT_AMBULATORY_CARE_PROVIDER_SITE_OTHER): Payer: 59 | Admitting: *Deleted

## 2015-07-17 DIAGNOSIS — E291 Testicular hypofunction: Secondary | ICD-10-CM | POA: Diagnosis not present

## 2015-07-17 MED ORDER — TESTOSTERONE CYPIONATE 200 MG/ML IM SOLN
400.0000 mg | Freq: Once | INTRAMUSCULAR | Status: AC
  Administered 2015-07-17: 400 mg via INTRAMUSCULAR

## 2015-07-17 NOTE — Progress Notes (Signed)
Patient ID: Jay Brooks, male   DOB: 05-26-1961, 54 y.o.   MRN: [id number] Patient here for testosterone injection.  Per lab results from Vicie Mutters, [place], patient needs to increase his dose from 300 mg to 400 mg every 3 weeks.  Testosterone Cypionate 200 mg 2 ml given in RUOQ.  Tolerated well.

## 2015-07-23 ENCOUNTER — Other Ambulatory Visit: Payer: Self-pay | Admitting: Gastroenterology

## 2015-08-10 ENCOUNTER — Ambulatory Visit (INDEPENDENT_AMBULATORY_CARE_PROVIDER_SITE_OTHER): Payer: 59 | Admitting: *Deleted

## 2015-08-10 DIAGNOSIS — E291 Testicular hypofunction: Secondary | ICD-10-CM

## 2015-08-10 MED ORDER — TESTOSTERONE CYPIONATE 200 MG/ML IM SOLN
400.0000 mg | Freq: Once | INTRAMUSCULAR | Status: AC
  Administered 2015-08-10: 400 mg via INTRAMUSCULAR

## 2015-08-10 NOTE — Progress Notes (Signed)
Patient ID: Jay Brooks, male   DOB: Apr 05, 1961, 54 y.o.   MRN: [id number] Patient here today for Testosterone injection.  Testosterone Cypionate 200 mg/ml 2 ml IM LUOQ.  Patient tolerated well.

## 2015-08-19 ENCOUNTER — Other Ambulatory Visit: Payer: Self-pay | Admitting: Gastroenterology

## 2015-09-08 ENCOUNTER — Ambulatory Visit (INDEPENDENT_AMBULATORY_CARE_PROVIDER_SITE_OTHER): Payer: 59 | Admitting: *Deleted

## 2015-09-08 DIAGNOSIS — E291 Testicular hypofunction: Secondary | ICD-10-CM

## 2015-09-08 MED ORDER — TESTOSTERONE CYPIONATE 200 MG/ML IM SOLN
400.0000 mg | Freq: Once | INTRAMUSCULAR | Status: AC
  Administered 2015-09-08: 400 mg via INTRAMUSCULAR

## 2015-09-08 NOTE — Progress Notes (Signed)
Patient ID: Jay Brooks, male   DOB: January 28, 1961, 54 y.o.   MRN: [id number] Patient here for his Testosterone injection.  Testosterone Cypionate 200 mg/ml 2 ml given IM in the RUOQ.  Patient tolerated well.

## 2015-09-21 ENCOUNTER — Telehealth: Payer: Self-pay | Admitting: Gastroenterology

## 2015-09-21 ENCOUNTER — Other Ambulatory Visit: Payer: Self-pay | Admitting: Gastroenterology

## 2015-09-21 NOTE — Telephone Encounter (Signed)
Is he also taking ranitidine 150mg  at bedtime every night?  Also confirm the timing of his BID PPI in relation to meals.  The last time I saw him he was only on once daily.

## 2015-09-21 NOTE — Telephone Encounter (Signed)
Pt states he is still taking omeprazole 40mg  BID but he is still having breakthrough symptoms with his reflux. Pt wants to know what Dr. Ardis Hughs recommends. Please advise.

## 2015-09-22 NOTE — Telephone Encounter (Signed)
Patient notified of recommendations and med list updated. Appointment scheduled with patient for a follow up on 11-23-15 @ 3:45. Patient has no questions or concerns at this time.

## 2015-09-22 NOTE — Telephone Encounter (Signed)
Per last note he was to be on Ranitidine 150 mg at bedtime and Prilosec 40 mg daily. Patient states that he has not been taking the Ranitidine and that he increased the Prilosec himself to 40mg  BID-1 20 min before breakfast and 1 before dinner.  IS it ok for him to continue the Prilosec BID and add in the Ranitidine?

## 2015-09-22 NOTE — Telephone Encounter (Signed)
Yes it is OK.  Also rov with me in 6-8 weeks to discuss response to this maximal medical therapy attempt.

## 2015-09-25 ENCOUNTER — Encounter: Payer: Self-pay | Admitting: Internal Medicine

## 2015-09-25 ENCOUNTER — Ambulatory Visit (INDEPENDENT_AMBULATORY_CARE_PROVIDER_SITE_OTHER): Payer: 59 | Admitting: Internal Medicine

## 2015-09-25 VITALS — BP 120/80 | HR 68 | Temp 97.7°F | Resp 16 | Ht 69.0 in | Wt 167.0 lb

## 2015-09-25 DIAGNOSIS — J0140 Acute pansinusitis, unspecified: Secondary | ICD-10-CM

## 2015-09-25 DIAGNOSIS — Z23 Encounter for immunization: Secondary | ICD-10-CM

## 2015-09-25 MED ORDER — AZITHROMYCIN 250 MG PO TABS: ORAL_TABLET | ORAL | Status: DC

## 2015-09-25 MED ORDER — PREDNISONE 20 MG PO TABS: ORAL_TABLET | ORAL | Status: DC

## 2015-09-25 MED ORDER — IPRATROPIUM BROMIDE 0.03 % NA SOLN: 2.0000 | Freq: Three times a day (TID) | NASAL | Status: DC

## 2015-09-25 NOTE — Addendum Note (Signed)
Addended by: Mattalynn Crandle A on: 09/25/2015 10:49 AM   Modules accepted: Orders

## 2015-09-25 NOTE — Addendum Note (Signed)
Addended by: Elsie Amis D on: 09/25/2015 12:17 PM   Modules accepted: Miquel Dunn

## 2015-09-25 NOTE — Progress Notes (Signed)
   Subjective:    Patient ID: Jay Brooks, male    DOB: May 25, 1961, 54 y.o.   MRN: [id number]  Sore Throat  Associated symptoms include congestion and coughing. Pertinent negatives include no shortness of breath or trouble swallowing.  Patient presents to the office for evaluation of sore throat x 1 week.  He reports that he has been having intermittent sore throat that has some mild cough that is dry and some runny nose.  He denies fevers and chills.  He reports that he has no sick contacts that he is aware of.  He doesn't tend to have seasonal allergies.  He is afraid that this is going to turn into a sinus infection.  He reports that he has been taking nasonex.  He has not tried any other OTC meds. He reports that nothing makes him worse.  He does need a work note.      Review of Systems  Constitutional: Negative for fever, chills and fatigue.  HENT: Positive for congestion, postnasal drip, rhinorrhea, sore throat and voice change. Negative for sinus pressure and trouble swallowing.   Respiratory: Positive for cough. Negative for chest tightness, shortness of breath and wheezing.   Cardiovascular: Negative for chest pain, palpitations and leg swelling.       Objective:   Physical Exam  Constitutional: He is oriented to person, place, and time. He appears well-developed and well-nourished. No distress.  HENT:  Head: Normocephalic and atraumatic.  Nose: Mucosal edema present. Right sinus exhibits no maxillary sinus tenderness and no frontal sinus tenderness. Left sinus exhibits no maxillary sinus tenderness and no frontal sinus tenderness.  Mouth/Throat: Oropharynx is clear and moist. No oropharyngeal exudate.  Eyes: Conjunctivae and EOM are normal. Pupils are equal, round, and reactive to light. No scleral icterus.  Neck: Normal range of motion. Neck supple. No JVD present. No thyromegaly present.  Cardiovascular: Normal rate, regular rhythm and intact distal pulses.  Exam reveals no  gallop and no friction rub.   No murmur heard. Pulmonary/Chest: Effort normal and breath sounds normal. No respiratory distress. He has no wheezes. He has no rales. He exhibits no tenderness.  Musculoskeletal: Normal range of motion.  Lymphadenopathy:    He has no cervical adenopathy.  Neurological: He is alert and oriented to person, place, and time.  Skin: Skin is warm and dry. He is not diaphoretic.  Psychiatric: He has a normal mood and affect. His behavior is normal. Judgment and thought content normal.  Nursing note and vitals reviewed.   Filed Vitals:   09/25/15 1018  BP: 120/80  Pulse: 68  Temp: 97.7 F (36.5 C)  Resp: 16         Assessment & Plan:    1. Acute pansinusitis, recurrence not specified -zpak -nasal saline -atrovent nasal spray -cont nasonex

## 2015-10-06 ENCOUNTER — Ambulatory Visit (INDEPENDENT_AMBULATORY_CARE_PROVIDER_SITE_OTHER): Payer: 59

## 2015-10-06 DIAGNOSIS — E291 Testicular hypofunction: Secondary | ICD-10-CM | POA: Diagnosis not present

## 2015-10-06 MED ORDER — TESTOSTERONE CYPIONATE 200 MG/ML IM SOLN
400.0000 mg | Freq: Once | INTRAMUSCULAR | Status: AC
  Administered 2015-10-06: 400 mg via INTRAMUSCULAR

## 2015-10-06 NOTE — Progress Notes (Signed)
Patient here for his Testosterone injection. Testosterone Cypionate 200 mg/ml 2 ml given IM in the LUOQ. Patient tolerated well.

## 2015-11-08 DIAGNOSIS — K4090 Unilateral inguinal hernia, without obstruction or gangrene, not specified as recurrent: Secondary | ICD-10-CM

## 2015-11-08 HISTORY — DX: Unilateral inguinal hernia, without obstruction or gangrene, not specified as recurrent: K40.90

## 2015-11-11 ENCOUNTER — Ambulatory Visit (INDEPENDENT_AMBULATORY_CARE_PROVIDER_SITE_OTHER): Payer: 59 | Admitting: *Deleted

## 2015-11-11 DIAGNOSIS — E291 Testicular hypofunction: Secondary | ICD-10-CM | POA: Diagnosis not present

## 2015-11-11 MED ORDER — TESTOSTERONE CYPIONATE 200 MG/ML IM SOLN
400.0000 mg | Freq: Once | INTRAMUSCULAR | Status: AC
  Administered 2015-11-11: 400 mg via INTRAMUSCULAR

## 2015-11-11 NOTE — Progress Notes (Signed)
Patient ID: Jay Brooks, male   DOB: 1961-04-08, 55 y.o.   MRN: [id number] Patient presents for testosterone injection for hypogonadism Tx.  Patient received 2 cc IM right glute and tolerated well.  Will RTC in 2-3 weeks for next injection.

## 2015-11-23 ENCOUNTER — Ambulatory Visit: Payer: 59 | Admitting: Gastroenterology

## 2015-12-08 ENCOUNTER — Ambulatory Visit (INDEPENDENT_AMBULATORY_CARE_PROVIDER_SITE_OTHER): Payer: 59 | Admitting: Internal Medicine

## 2015-12-08 ENCOUNTER — Encounter: Payer: Self-pay | Admitting: Internal Medicine

## 2015-12-08 VITALS — BP 126/84 | HR 76 | Temp 97.6°F | Resp 16 | Ht 70.0 in | Wt 166.7 lb

## 2015-12-08 DIAGNOSIS — N400 Enlarged prostate without lower urinary tract symptoms: Secondary | ICD-10-CM | POA: Insufficient documentation

## 2015-12-08 DIAGNOSIS — I10 Essential (primary) hypertension: Secondary | ICD-10-CM | POA: Diagnosis not present

## 2015-12-08 DIAGNOSIS — Z111 Encounter for screening for respiratory tuberculosis: Secondary | ICD-10-CM

## 2015-12-08 DIAGNOSIS — R5383 Other fatigue: Secondary | ICD-10-CM

## 2015-12-08 DIAGNOSIS — Z125 Encounter for screening for malignant neoplasm of prostate: Secondary | ICD-10-CM

## 2015-12-08 DIAGNOSIS — Z0001 Encounter for general adult medical examination with abnormal findings: Secondary | ICD-10-CM

## 2015-12-08 DIAGNOSIS — E559 Vitamin D deficiency, unspecified: Secondary | ICD-10-CM

## 2015-12-08 DIAGNOSIS — R7303 Prediabetes: Secondary | ICD-10-CM

## 2015-12-08 DIAGNOSIS — Z1212 Encounter for screening for malignant neoplasm of rectum: Secondary | ICD-10-CM

## 2015-12-08 DIAGNOSIS — K219 Gastro-esophageal reflux disease without esophagitis: Secondary | ICD-10-CM

## 2015-12-08 DIAGNOSIS — Z0000 Encounter for general adult medical examination without abnormal findings: Secondary | ICD-10-CM | POA: Diagnosis not present

## 2015-12-08 DIAGNOSIS — E291 Testicular hypofunction: Secondary | ICD-10-CM

## 2015-12-08 DIAGNOSIS — Z79899 Other long term (current) drug therapy: Secondary | ICD-10-CM

## 2015-12-08 DIAGNOSIS — R0989 Other specified symptoms and signs involving the circulatory and respiratory systems: Secondary | ICD-10-CM

## 2015-12-08 DIAGNOSIS — E785 Hyperlipidemia, unspecified: Secondary | ICD-10-CM

## 2015-12-08 LAB — CBC WITH DIFFERENTIAL/PLATELET
Basophils Absolute: 0 10*3/uL (ref 0.0–0.1)
Basophils Relative: 0 % (ref 0–1)
Eosinophils Absolute: 0.1 10*3/uL (ref 0.0–0.7)
Eosinophils Relative: 1 % (ref 0–5)
HCT: 48 % (ref 39.0–52.0)
Hemoglobin: 16.4 g/dL (ref 13.0–17.0)
Lymphocytes Relative: 25 % (ref 12–46)
Lymphs Abs: 1.5 10*3/uL (ref 0.7–4.0)
MCH: 31.8 pg (ref 26.0–34.0)
MCHC: 34.2 g/dL (ref 30.0–36.0)
MCV: 93.2 fL (ref 78.0–100.0)
MPV: 9.5 fL (ref 8.6–12.4)
Monocytes Absolute: 0.6 10*3/uL (ref 0.1–1.0)
Monocytes Relative: 11 % (ref 3–12)
Neutro Abs: 3.7 10*3/uL (ref 1.7–7.7)
Neutrophils Relative %: 63 % (ref 43–77)
Platelets: 180 10*3/uL (ref 150–400)
RBC: 5.15 MIL/uL (ref 4.22–5.81)
RDW: 13.4 % (ref 11.5–15.5)
WBC: 5.8 10*3/uL (ref 4.0–10.5)

## 2015-12-08 MED ORDER — FINASTERIDE 5 MG PO TABS: ORAL_TABLET | ORAL | Status: DC

## 2015-12-08 MED ORDER — TESTOSTERONE CYPIONATE 200 MG/ML IM SOLN
400.0000 mg | Freq: Once | INTRAMUSCULAR | Status: AC
  Administered 2015-12-08: 400 mg via INTRAMUSCULAR

## 2015-12-08 NOTE — Progress Notes (Signed)
Patient ID: Jay Brooks, male   DOB: 07-Apr-1961, 55 y.o.   MRN: [id number]  Annual  Screening/Preventative Visit And Comprehensive Evaluation & Examination  This very nice 55 y.o. MWM presents for presents for a Wellness/Preventative Visit & comprehensive evaluation and management of multiple medical co-morbidities.  Patient has been followed for HTN, Prediabetes, Hyperlipidemia and Vitamin D Deficiency. Patient also has long hx/o insomnia and has had negative/normal sleep studies. Unfortunately as his occupation is  Insurance underwriter for a Clinical cytogeneticist, he is limited in what meds he is allowed to use for sleep.    Patient has hx/o labile HTN with elevated BP in 2005 and has been monitored expectantly since. Patient's BP has been controlled at home.Today's BP: 126/84 mmHg. Patient denies any cardiac symptoms as chest pain, palpitations, shortness of breath, dizziness or ankle swelling.   Patient's hyperlipidemia is controlled with diet and medications. Patient denies myalgias or other medication SE's. Last lipids were at goal with Cholesterol 140; HDL 38; LDL 70; Triglycerides 161 on 06/02/2015.   Patient has prediabetes with Nl A1c 5.0% and elevated insulin 55 in Oct 2014 consistent with insulin resistance.  Patient denies reactive hypoglycemic symptoms, visual blurring, diabetic polys or paresthesias. Last A1c was  5.2% on 06/02/2015.    Patient has Testosterone Deficiency with low level of 150 in 2010 and then 166 in Oct 2014 and is on replacement injection therapy with last shot 1 month ago until today's injection. Finally, patient has history of Vitamin D Deficiency of "32" in 2009 and last vitamin D was still low 34 on 06/02/2015.    Medication Sig  . MALARONE 250-100 MG  Take 1 tablet by mouth daily. As needed for work  . desonide  0.05 % cream   . ATROVENT 0.03 % nasal spray Place 2 sprays into the nose 3 times daily.  Marland Kitchen omeprazole 40 MG  Take 1 cap 2  times daily.  . Probiotic  Take 1  capsule by mouth daily.  . ranitidine 150 MG  Take 1 tab at bedtime.  Marland Kitchen testosterone cypionate 200 MG/ML inj Inject 2 mLs IM every 2 weeks or as directed   Allergies  Allergen Reactions  . Allegra [Fexofenadine]   . Benadryl [Diphenhydramine] Other (See Comments)    Insomnia  . Doxycycline Nausea Only  . Levaquin [Levofloxacin] Nausea And Vomiting    Patient states that after he took levaquin this past time he had nausea with vomiting  . Other     Benzol Peroxide- caused blisters   Past Medical History  Diagnosis Date  . Chronic sinusitis   . Sleep deficient   . Diverticulosis of colon (without mention of hemorrhage)   . Lyme disease    Health Maintenance  Topic Date Due  . Hepatitis C Screening  1961/06/10  . HIV Screening  02/04/1974  . TETANUS/TDAP  02/04/1978  . COLONOSCOPY  02/04/2009  . INFLUENZA VACCINE  06/07/2016   Immunization History  Administered Date(s) Administered  . Influenza Split 08/07/2012, 09/25/2014  . Influenza, Seasonal, Injecte, Preservative Fre 09/25/2015  . PPD Test 12/01/2014   Past Surgical History  Procedure Laterality Date  . Hernia repair     Family History  Problem Relation Age of Onset  . Breast cancer Maternal Grandmother   . Colon cancer Neg Hx   . Colon polyps Brother   . Colon polyps Maternal Uncle   . Cancer Mother 19    pancreatic    Social History   Social History  .  Marital Status: Married    Spouse Name: N/A  . Number of Children: 1  . Years of Education: N/A   Occupational History  . PILOT [employer]   Social History Main Topics  . Smoking status: Never Smoker   . Smokeless tobacco: Never Used  . Alcohol Use: Yes     Comment: 3 drinks a month  . Drug Use: No  . Sexual Activity: ACTIVE   Social History Narrative   Daily caffeine     ROS Constitutional: Denies fever, chills, weight loss/gain, headaches, insomnia,  night sweats or change in appetite. Does c/o fatigue. Eyes: Denies redness, blurred  vision, diplopia, discharge, itchy or watery eyes.  ENT: Denies discharge, congestion, post nasal drip, epistaxis, sore throat, earache, hearing loss, dental pain, Tinnitus, Vertigo, Sinus pain or snoring.  Cardio: Denies chest pain, palpitations, irregular heartbeat, syncope, dyspnea, diaphoresis, orthopnea, PND, claudication or edema Respiratory: denies cough, dyspnea, DOE, pleurisy, hoarseness, laryngitis or wheezing.  Gastrointestinal: Denies dysphagia, heartburn, reflux, water brash, pain, cramps, nausea, vomiting, bloating, diarrhea, constipation, hematemesis, melena, hematochezia, jaundice or hemorrhoids Genitourinary: Denies dysuria, frequency, urgency, nocturia, hesitancy, discharge, hematuria or flank pain. Reports decreased flow of urinary stream.  Musculoskeletal: Denies arthralgia, myalgia, stiffness, Jt. Swelling, pain, limp or strain/sprain. Denies Falls. Skin: Denies puritis, rash, hives, warts, acne, eczema or change in skin lesion Neuro: No weakness, tremor, incoordination, spasms, paresthesia or pain Psychiatric: Denies confusion, memory loss or sensory loss. Denies Depression. Endocrine: Denies change in weight, skin, hair change, nocturia, and paresthesia, diabetic polys, visual blurring or hyper / hypo glycemic episodes.  Heme/Lymph: No excessive bleeding, bruising or enlarged lymph nodes.  Physical Exam  BP 126/84 mmHg  Pulse 76  Temp(Src) 97.6 F (36.4 C)  Resp 16  Ht 5\' 10"  (1.778 m)  Wt 166 lb 11.2 oz (75.615 kg)  BMI 23.92 kg/m2  General Appearance: Well nourished, in no apparent distress. Eyes: PERRLA, EOMs, conjunctiva no swelling or erythema, normal fundi and vessels. Sinuses: No frontal/maxillary tenderness ENT/Mouth: EACs patent / TMs  nl. Nares clear without erythema, swelling, mucoid exudates. Oral hygiene is good. No erythema, swelling, or exudate. Tongue normal, non-obstructing. Tonsils not swollen or erythematous. Hearing normal.  Neck: Supple, thyroid  normal. No bruits, nodes or JVD. Respiratory: Respiratory effort normal.  BS equal and clear bilateral without rales, rhonci, wheezing or stridor. Cardio: Heart sounds are normal with regular rate and rhythm and no murmurs, rubs or gallops. Peripheral pulses are normal and equal bilaterally without edema. No aortic or femoral bruits. Chest: symmetric with normal excursions and percussion.  Abdomen: Soft, with Nl bowel sounds. Nontender, no guarding, rebound, hernias, masses, or organomegaly.  Lymphatics: Non tender without lymphadenopathy.  Genitourinary: No hernias.Testes nl. DRE - prostate 1-2(+) symmetrically enlarged - smooth & firm w/o nodules. Musculoskeletal: Full ROM all peripheral extremities, joint stability, 5/5 strength, and normal gait. Skin: Warm and dry without rashes, lesions, cyanosis, clubbing or  ecchymosis.  Neuro: Cranial nerves intact, reflexes equal bilaterally. Normal muscle tone, no cerebellar symptoms. Sensation intact.  Pysch: Alert and oriented X 3 with normal affect, insight and judgment appropriate.   Assessment and Plan  1. Annual Preventative/Screening Exam   - Microalbumin / creatinine urine ratio - EKG 12-Lead - [place], RETROPERITNL ABD,  LTD - Urinalysis, Routine w reflex microscopic  - Vitamin B12 - Iron and TIBC - PSA - Testosterone - CBC with Differential/Platelet - BASIC METABOLIC PANEL WITH GFR - Hepatic function panel - Magnesium - Lipid panel - TSH -  Hemoglobin A1c - Insulin, random - VITAMIN D 25 Hydroxy  - POC Hemoccult Bld/Stl   2. Labile hypertension   - Microalbumin / creatinine urine ratio - EKG 12-Lead - [place], RETROPERITNL ABD,  LTD - TSH  3. Hyperlipidemia   - Lipid panel - TSH  4. Prediabetes   - Hemoglobin A1c - Insulin, random  5. Vitamin D deficiency   - VITAMIN D 25 Hydroxy  6. Testosterone Deficiency  - Testosterone  7. Gastroesophageal reflux disease   8. Screening for rectal cancer  -  Testosterone - POC Hemoccult Bld/Stl   9. Prostate cancer screening  - PSA  10. Other fatigue  - Vitamin B12 - Iron and TIBC - Testosterone - CBC with Differential/Platelet - TSH  11. Prostatism   - Rx Proscar 5 mg   12. Medication management   - Urinalysis, Routine w reflex microscopic - CBC with Differential/Platelet - BASIC METABOLIC PANEL WITH GFR - Hepatic function panel - Magnesium   Continue prudent diet as discussed, weight control, BP monitoring, regular exercise, and medications as discussed.  Discussed med effects and SE's. Routine screening labs and tests as requested with regular follow-up as recommended. Over 40 minutes of exam, counseling, chart review and high complex critical decision making was performed

## 2015-12-08 NOTE — Patient Instructions (Signed)

## 2015-12-09 LAB — VITAMIN B12: Vitamin B-12: 576 pg/mL (ref 211–911)

## 2015-12-09 LAB — URINALYSIS, ROUTINE W REFLEX MICROSCOPIC
Bilirubin Urine: NEGATIVE
Glucose, UA: NEGATIVE
Hgb urine dipstick: NEGATIVE
Ketones, ur: NEGATIVE
Leukocytes, UA: NEGATIVE
Nitrite: NEGATIVE
Protein, ur: NEGATIVE
Specific Gravity, Urine: 1.01 (ref 1.001–1.035)
pH: 6 (ref 5.0–8.0)

## 2015-12-09 LAB — MICROALBUMIN / CREATININE URINE RATIO
Creatinine, Urine: 41 mg/dL (ref 20–370)
Microalb, Ur: <0.2 mg/dL

## 2015-12-09 LAB — BASIC METABOLIC PANEL WITH GFR
BUN: 13 mg/dL (ref 7–25)
CO2: 28 mmol/L (ref 20–31)
Calcium: 9.4 mg/dL (ref 8.6–10.3)
Chloride: 97 mmol/L — ABNORMAL LOW (ref 98–110)
Creat: 1.01 mg/dL (ref 0.70–1.33)
GFR, Est African American: >89 mL/min (ref >=60)
GFR, Est Non African American: 83 mL/min (ref >=60)
Glucose, Bld: 74 mg/dL (ref 65–99)
Potassium: 4.2 mmol/L (ref 3.5–5.3)
Sodium: 139 mmol/L (ref 135–146)

## 2015-12-09 LAB — PSA: PSA: 0.64 ng/mL (ref <=4.00)

## 2015-12-09 LAB — INSULIN, RANDOM: Insulin: 9.5 u[IU]/mL (ref 2.0–19.6)

## 2015-12-09 LAB — VITAMIN D 25 HYDROXY (VIT D DEFICIENCY, FRACTURES): Vit D, 25-Hydroxy: 57 ng/mL (ref 30–100)

## 2015-12-09 LAB — TSH: TSH: 2.355 u[IU]/mL (ref 0.350–4.500)

## 2015-12-09 LAB — TESTOSTERONE: Testosterone: 105 ng/dL — ABNORMAL LOW (ref 250–827)

## 2015-12-09 LAB — HEPATIC FUNCTION PANEL
ALT: 22 U/L (ref 9–46)
AST: 23 U/L (ref 10–35)
Albumin: 4.6 g/dL (ref 3.6–5.1)
Alkaline Phosphatase: 43 U/L (ref 40–115)
Bilirubin, Direct: 0.1 mg/dL (ref <=0.2)
Indirect Bilirubin: 0.7 mg/dL (ref 0.2–1.2)
Total Bilirubin: 0.8 mg/dL (ref 0.2–1.2)
Total Protein: 7 g/dL (ref 6.1–8.1)

## 2015-12-09 LAB — LIPID PANEL
Cholesterol: 155 mg/dL (ref 125–200)
HDL: 39 mg/dL — ABNORMAL LOW (ref >=40)
LDL Cholesterol: 73 mg/dL (ref <130)
Total CHOL/HDL Ratio: 4 Ratio (ref <=5.0)
Triglycerides: 213 mg/dL — ABNORMAL HIGH (ref <150)
VLDL: 43 mg/dL — ABNORMAL HIGH (ref <30)

## 2015-12-09 LAB — HEMOGLOBIN A1C
Hgb A1c MFr Bld: 5.1 % (ref <5.7)
Mean Plasma Glucose: 100 mg/dL (ref <117)

## 2015-12-09 LAB — IRON AND TIBC
%SAT: 32 % (ref 15–60)
Iron: 93 ug/dL (ref 50–180)
TIBC: 291 ug/dL (ref 250–425)
UIBC: 198 ug/dL (ref 125–400)

## 2015-12-09 LAB — MAGNESIUM: Magnesium: 2.1 mg/dL (ref 1.5–2.5)

## 2015-12-15 ENCOUNTER — Encounter: Payer: Self-pay | Admitting: Internal Medicine

## 2015-12-21 ENCOUNTER — Other Ambulatory Visit: Payer: Self-pay | Admitting: *Deleted

## 2015-12-21 DIAGNOSIS — Z0001 Encounter for general adult medical examination with abnormal findings: Secondary | ICD-10-CM

## 2015-12-21 DIAGNOSIS — Z1212 Encounter for screening for malignant neoplasm of rectum: Secondary | ICD-10-CM

## 2015-12-21 LAB — POC HEMOCCULT BLD/STL (HOME/3-CARD/SCREEN)
Card #2 Fecal Occult Blod, POC: NEGATIVE
Card #3 Fecal Occult Blood, POC: NEGATIVE
Fecal Occult Blood, POC: NEGATIVE

## 2015-12-30 ENCOUNTER — Ambulatory Visit (INDEPENDENT_AMBULATORY_CARE_PROVIDER_SITE_OTHER): Payer: 59 | Admitting: *Deleted

## 2015-12-30 DIAGNOSIS — E291 Testicular hypofunction: Secondary | ICD-10-CM

## 2015-12-30 MED ORDER — TESTOSTERONE CYPIONATE 200 MG/ML IM SOLN
400.0000 mg | Freq: Once | INTRAMUSCULAR | Status: AC
  Administered 2015-12-30: 400 mg via INTRAMUSCULAR

## 2015-12-30 NOTE — Progress Notes (Signed)
Patient ID: Jay Brooks, male   DOB: 1961/09/08, 55 y.o.   MRN: [id number] Patient presents for testosterone injection for hypogonadism Tx.  Patient received 2 cc IM right glute and tolerated well.

## 2016-01-05 ENCOUNTER — Encounter: Payer: Self-pay | Admitting: Physician Assistant

## 2016-01-05 ENCOUNTER — Ambulatory Visit (INDEPENDENT_AMBULATORY_CARE_PROVIDER_SITE_OTHER): Payer: 59 | Admitting: Physician Assistant

## 2016-01-05 VITALS — BP 128/64 | HR 84 | Temp 97.3°F | Resp 16 | Ht 70.0 in | Wt 167.2 lb

## 2016-01-05 DIAGNOSIS — J0100 Acute maxillary sinusitis, unspecified: Secondary | ICD-10-CM | POA: Diagnosis not present

## 2016-01-05 MED ORDER — PREDNISONE 20 MG PO TABS: ORAL_TABLET | ORAL | Status: DC

## 2016-01-05 MED ORDER — AZITHROMYCIN 250 MG PO TABS: ORAL_TABLET | ORAL | Status: AC

## 2016-01-05 NOTE — Progress Notes (Signed)
Subjective:    Patient ID: Jay Brooks, male    DOB: 03/07/1961, 55 y.o.   MRN: [id number]  HPI 55 y.o. WM presents with sinus infection x 3-4 days. Has had sore throat, non productive cough, frontal sinus pressure, sinus congestion, denies fever, chills, SOB, wheezing, CP. Has been using afrin x 1 days and mucinex night.   Blood pressure 128/64, pulse 84, temperature 97.3 F (36.3 C), temperature source Temporal, resp. rate 16, height 5\' 10"  (1.778 m), weight 167 lb 3.2 oz (75.841 kg), SpO2 98 %.  Past Medical History  Diagnosis Date  . Chronic sinusitis   . Sleep deficient   . Diverticulosis of colon (without mention of hemorrhage)   . Lyme disease    Current Outpatient Prescriptions on File Prior to Visit  Medication Sig Dispense Refill  . atovaquone-proguanil (MALARONE) 250-100 MG TABS Take 1 tablet by mouth daily. As needed for work    . calcium carbonate (TUMS - DOSED IN MG ELEMENTAL CALCIUM) 500 MG chewable tablet Chew 1 tablet by mouth daily.    Marland Kitchen desonide (DESOWEN) 0.05 % cream     . ipratropium (ATROVENT) 0.03 % nasal spray Place 2 sprays into the nose 3 (three) times daily. 30 mL 2  . omeprazole (PRILOSEC) 40 MG capsule Take 1 capsule (40 mg total) by mouth 2 (two) times daily. 60 capsule 2  . Probiotic Product (PROBIOTIC DAILY PO) Take 1 capsule by mouth daily.    . ranitidine (ZANTAC) 150 MG tablet Take 1 tablet (150 mg total) by mouth at bedtime. 30 tablet 0  . testosterone cypionate (DEPOTESTOTERONE CYPIONATE) 200 MG/ML injection Inject 2 mLs IM every 2 weeks or as directed 10 mL 5   No current facility-administered medications on file prior to visit.   Review of Systems  Constitutional: Negative for fever, chills and diaphoresis.  HENT: Positive for congestion, sinus pressure, sneezing and sore throat. Negative for ear pain, trouble swallowing and voice change.   Eyes: Negative.   Respiratory: Positive for cough. Negative for chest tightness, shortness of breath  and wheezing.   Cardiovascular: Negative.   Gastrointestinal: Negative.   Genitourinary: Negative.   Musculoskeletal: Negative for neck pain.  Neurological: Negative.  Negative for headaches.       Objective:   Physical Exam  Constitutional: He is oriented to person, place, and time. He appears well-developed and well-nourished.  HENT:  Head: Normocephalic and atraumatic.  Right Ear: Hearing and tympanic membrane normal.  Left Ear: Hearing and tympanic membrane normal.  Nose: Right sinus exhibits maxillary sinus tenderness. Left sinus exhibits maxillary sinus tenderness.  Mouth/Throat: Uvula is midline and mucous membranes are normal. Posterior oropharyngeal erythema present. No oropharyngeal exudate, posterior oropharyngeal edema or tonsillar abscesses.  Eyes: Conjunctivae are normal. Pupils are equal, round, and reactive to light.  Neck: Normal range of motion. Neck supple.  Cardiovascular: Normal rate and regular rhythm.   Pulmonary/Chest: Effort normal and breath sounds normal.  Abdominal: Soft. Bowel sounds are normal.  Musculoskeletal: Normal range of motion.  Lymphadenopathy:    He has no cervical adenopathy.  Neurological: He is alert and oriented to person, place, and time.  Skin: Skin is warm and dry. No rash noted.       Assessment & Plan:  1. Acute maxillary sinusitis, recurrence not specified Will hold the zpak and take if she is not getting better, increase fluids, rest, cont allergy pill - azithromycin (ZITHROMAX) 250 MG tablet; Take 2 tablets (500 mg) on  Day 1,  followed by 1 tablet (250 mg) once daily on Days 2 through 5.  Dispense: 6 each; Refill: 1 - predniSONE (DELTASONE) 20 MG tablet; 2 tablets daily for 3 days, 1 tablet daily for 4 days.  Dispense: 10 tablet; Refill: 0

## 2016-01-05 NOTE — Patient Instructions (Signed)
Please take the prednisone to help decrease inflammation and therefore decrease symptoms. Take it it with food to avoid GI upset. It can cause increased energy but on the other hand it can make it hard to sleep at night so please take it AT NIGHT WITH DINNER, it takes 8-12 hours to start working so it will NOT affect your sleeping if you take it at night with your food!!  If you are diabetic it will increase your sugars so decrease carbs and monitor your sugars closely.    HOW TO TREAT VIRAL COUGH AND COLD SYMPTOMS:  -Symptoms usually last at least 1 week with the worst symptoms being around day 4.  - colds usually start with a sore throat and end with a cough, and the cough can take 2 weeks to get better.  -No antibiotics are needed for colds, flu, sore throats, cough, bronchitis UNLESS symptoms are longer than 7 days OR if you are getting better then get drastically worse.  -There are a lot of combination medications (Dayquil, Nyquil, Vicks 44, tyelnol cold and sinus, ETC). Please look at the ingredients on the back so that you are treating the correct symptoms and not doubling up on medications/ingredients.    Medicines you can use  Nasal congestion  - pseudoephedrine (Sudafed)- behind the counter, do not use if you have high blood pressure, medicine that have -D in them.  - phenylephrine (Sudafed PE) -Dextormethorphan + chlorpheniramine (Coridcidin HBP)- okay if you have high blood pressure -Oxymetazoline (Afrin) nasal spray- LIMIT to 3 days -Saline nasal spray -Neti pot (used distilled or bottled water)  Ear pain/congestion  -pseudoephedrine (sudafed) - Nasonex/flonase nasal spray  Fever  -Acetaminophen (Tyelnol) -Ibuprofen (Advil, motrin, aleve)  Sore Throat  -Acetaminophen (Tyelnol) -Ibuprofen (Advil, motrin, aleve) -Drink a lot of water -Gargle with salt water - Rest your voice (don't talk) -Throat sprays -Cough drops  Body Aches  -Acetaminophen (Tyelnol) -Ibuprofen  (Advil, motrin, aleve)  Headache  -Acetaminophen (Tyelnol) -Ibuprofen (Advil, motrin, aleve) - Exedrin, Exedrin Migraine  Allergy symptoms (cough, sneeze, runny nose, itchy eyes) -Claritin or loratadine cheapest but likely the weakest  -Zyrtec or certizine at night because it can make you sleepy -The strongest is allegra or fexafinadine  Cheapest at [employer], [employer], [employer]  Cough  -Dextromethorphan (Delsym)- medicine that has DM in it -Guafenesin (Mucinex/Robitussin) - cough drops - drink lots of water  Chest Congestion  -Guafenesin (Mucinex/Robitussin)  Red Itchy Eyes  - Naphcon-A  Upset Stomach  - Bland diet (nothing spicy, greasy, fried, and high acid foods like tomatoes, oranges, berries) -OKAY- cereal, bread, soup, crackers, rice -Eat smaller more frequent meals -reduce caffeine, no alcohol -Loperamide (Imodium-AD) if diarrhea -Prevacid for heart burn  General health when sick  -Hydration -wash your hands frequently -keep surfaces clean -change pillow cases and sheets often -Get fresh air but do not exercise strenuously -Vitamin D, double up on it - Vitamin C -Zinc       

## 2016-01-29 ENCOUNTER — Ambulatory Visit (INDEPENDENT_AMBULATORY_CARE_PROVIDER_SITE_OTHER): Payer: 59 | Admitting: *Deleted

## 2016-01-29 DIAGNOSIS — E291 Testicular hypofunction: Secondary | ICD-10-CM

## 2016-01-29 MED ORDER — TESTOSTERONE CYPIONATE 200 MG/ML IM SOLN
400.0000 mg | Freq: Once | INTRAMUSCULAR | Status: AC
  Administered 2016-01-29: 400 mg via INTRAMUSCULAR

## 2016-01-29 NOTE — Progress Notes (Signed)
Patient ID: Jay Brooks, male   DOB: 15-Dec-1960, 55 y.o.   MRN: [id number] Patient presents for testosterone injection for hypogonadism Tx.  Patient received 2 cc IM left glute and tolerated well.

## 2016-02-01 ENCOUNTER — Ambulatory Visit (INDEPENDENT_AMBULATORY_CARE_PROVIDER_SITE_OTHER): Payer: 59 | Admitting: Internal Medicine

## 2016-02-01 ENCOUNTER — Encounter: Payer: Self-pay | Admitting: Internal Medicine

## 2016-02-01 VITALS — BP 130/84 | HR 84 | Temp 97.8°F | Resp 16 | Ht 69.0 in | Wt 168.0 lb

## 2016-02-01 DIAGNOSIS — J309 Allergic rhinitis, unspecified: Secondary | ICD-10-CM | POA: Diagnosis not present

## 2016-02-01 MED ORDER — CHLORPHEN-PE-ACETAMINOPHEN 4-10-325 MG PO TABS: 1.0000 | ORAL_TABLET | Freq: Three times a day (TID) | ORAL | Status: DC

## 2016-02-01 MED ORDER — AZELASTINE HCL 0.1 % NA SOLN: 2.0000 | Freq: Two times a day (BID) | NASAL | Status: DC

## 2016-02-01 MED ORDER — BENZONATATE 200 MG PO CAPS: 200.0000 mg | ORAL_CAPSULE | Freq: Three times a day (TID) | ORAL | Status: DC | PRN

## 2016-02-01 NOTE — Progress Notes (Signed)
HPI  Patient presents to the office for evaluation of cough which has been persisting for the past month.  It has been going on for 1 months.  Patient reports all the time, dry, barky cough.  They also endorse postnasal drip and nasal congestion, sore throat, ear congestion.  .  They have tried antihistamines.  They report that nothing has worked.  They denies other sick contacts.  He has been taking claritin now for the past 4 days.    Review of Systems  Constitutional: Negative for fever, chills and malaise/fatigue.  HENT: Positive for congestion. Negative for ear pain and sore throat.   Respiratory: Positive for cough. Negative for shortness of breath and wheezing.   Cardiovascular: Negative for chest pain, palpitations and leg swelling.  Neurological: Negative for headaches.    PE:  Filed Vitals:   02/01/16 1610  BP: 130/84  Pulse: 84  Temp: 97.8 F (36.6 C)  Resp: 16    General:  Alert and non-toxic, WDWN, NAD HEENT: NCAT, PERLA, EOM normal, no occular discharge or erythema.  Nasal mucosal edema with sinus tenderness to palpation.  Oropharynx clear with minimal oropharyngeal edema and erythema.  Mucous membranes moist and pink. Neck:  Cervical adenopathy Chest:  RRR no MRGs.  Lungs clear to auscultation A&P with no wheezes rhonchi or rales.   Abdomen: +BS x 4 quadrants, soft, non-tender, no guarding, rigidity, or rebound. Skin: warm and dry no rash Neuro: A&Ox4, CN II-XII grossly intact  Assessment and Plan:   1. Allergic rhinitis, unspecified allergic rhinitis type -astelin -tessalon -cont antihistamine -nasal saline -flonase

## 2016-02-08 ENCOUNTER — Other Ambulatory Visit: Payer: Self-pay | Admitting: Gastroenterology

## 2016-02-17 ENCOUNTER — Ambulatory Visit (INDEPENDENT_AMBULATORY_CARE_PROVIDER_SITE_OTHER): Payer: 59 | Admitting: *Deleted

## 2016-02-17 ENCOUNTER — Other Ambulatory Visit: Payer: Self-pay | Admitting: *Deleted

## 2016-02-17 DIAGNOSIS — E291 Testicular hypofunction: Secondary | ICD-10-CM | POA: Diagnosis not present

## 2016-02-17 MED ORDER — TESTOSTERONE CYPIONATE 200 MG/ML IM SOLN
400.0000 mg | Freq: Once | INTRAMUSCULAR | Status: AC
  Administered 2016-02-17: 400 mg via INTRAMUSCULAR

## 2016-02-17 MED ORDER — TESTOSTERONE CYPIONATE 200 MG/ML IM SOLN: INTRAMUSCULAR | Status: DC

## 2016-02-17 NOTE — Progress Notes (Signed)
Patient ID: Jay Brooks, male   DOB: 05/12/1961, 55 y.o.   MRN: [id number] Patient here for Testosterone Cypionate 200 mg/ml 2 ml IM injection in right glut.  Patient tolerated well.

## 2016-02-18 ENCOUNTER — Ambulatory Visit: Payer: Self-pay

## 2016-03-07 ENCOUNTER — Encounter: Payer: Self-pay | Admitting: Internal Medicine

## 2016-03-07 ENCOUNTER — Ambulatory Visit (INDEPENDENT_AMBULATORY_CARE_PROVIDER_SITE_OTHER): Payer: 59 | Admitting: Internal Medicine

## 2016-03-07 VITALS — BP 122/80 | HR 74 | Temp 97.0°F | Resp 16 | Ht 69.0 in | Wt 170.4 lb

## 2016-03-07 DIAGNOSIS — K4090 Unilateral inguinal hernia, without obstruction or gangrene, not specified as recurrent: Secondary | ICD-10-CM | POA: Diagnosis not present

## 2016-03-07 NOTE — Progress Notes (Signed)
  Subjective:    Patient ID: Jay Brooks, male    DOB: 20-Jun-1961, 55 y.o.   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 Patient had remote [facility] rpr w/mesh in 2003 by Dr Ninfa Linden.   Medication Sig  . aspirin 81 MG Take 81 mg by mouth daily.  Marland Kitchen MALARONE 250-100 MG  Take 1 tablet by mouth daily. As needed for work  . calcium / TUMS 500 MG  Chew 1 tablet by mouth daily.  . Chlorphen-PE-Acetaminophen 4-10-325 MG TABS Take 1 tablet by mouth 3 (three) times daily.  Marland Kitchen desonide  0.05 % crm   . ATROVENT nasal spray Place 2 sprays into the nose 3 (three) times daily.  Marland Kitchen omeprazole  40 MG TAKE 1 CAPSULE TWICE DAILY FOR ONE MONTH THEN GO TO ONCE DAILY  . PROBIOTIC DAILY  Take 1 capsule by mouth daily.  . ranitidine  150 MG  Take 1 tablet (150 mg total) by mouth at bedtime.  Marland Kitchen testosterone cypionate  200 MG/ML inj Inject 2 mLs IM every 2 weeks or as directed  . NASACORT  nasal inhaler Place 2 sprays into the nose daily.   Allergies  Allergen Reactions  . Allegra [Fexofenadine]   . Doxycycline Nausea Only  . Levaquin [Levofloxacin] Nausea And Vomiting    Patient states that after he took levaquin this past time he had nausea with vomiting  . Other     Benzol Peroxide- caused blisters   Past Medical History  Diagnosis Date  . Chronic sinusitis   . Sleep deficient   . Diverticulosis of colon (without mention of hemorrhage)   . Lyme disease    Review of Systems  10 point systems review negative except as above.    Objective:   Physical Exam  BP 122/80 mmHg  Pulse 74  Temp(Src) 97 F (36.1 C) (Temporal)  Resp 16  Ht 5\' 9"  (1.753 m)  Wt 170 lb 6.4 oz (77.293 kg)  BMI 25.15 kg/m2  SpO2 97%  Abd exam is benign GU - Testes NL/cords OK. No RIH. Left canal is tender at inguinal ring with suspected small protrusion    Assessment & Plan:   1. Left inguinal  hernia  - Ambulatory referral to General Surgery

## 2016-03-08 ENCOUNTER — Encounter: Payer: Self-pay | Admitting: Internal Medicine

## 2016-03-10 ENCOUNTER — Encounter: Payer: Self-pay | Admitting: Physician Assistant

## 2016-03-10 ENCOUNTER — Ambulatory Visit (INDEPENDENT_AMBULATORY_CARE_PROVIDER_SITE_OTHER): Payer: 59 | Admitting: Physician Assistant

## 2016-03-10 VITALS — BP 130/80 | HR 72 | Temp 97.5°F | Resp 16 | Ht 69.0 in | Wt 169.6 lb

## 2016-03-10 DIAGNOSIS — E291 Testicular hypofunction: Secondary | ICD-10-CM

## 2016-03-10 DIAGNOSIS — N400 Enlarged prostate without lower urinary tract symptoms: Secondary | ICD-10-CM

## 2016-03-10 DIAGNOSIS — R109 Unspecified abdominal pain: Secondary | ICD-10-CM

## 2016-03-10 DIAGNOSIS — R350 Frequency of micturition: Secondary | ICD-10-CM

## 2016-03-10 LAB — CBC WITH DIFFERENTIAL/PLATELET
Basophils Absolute: 0 cells/uL (ref 0–200)
Basophils Relative: 0 %
Eosinophils Absolute: 55 cells/uL (ref 15–500)
Eosinophils Relative: 1 %
HCT: 47.5 % (ref 38.5–50.0)
Hemoglobin: 16.2 g/dL (ref 13.2–17.1)
Lymphocytes Relative: 24 %
Lymphs Abs: 1320 cells/uL (ref 850–3900)
MCH: 31.6 pg (ref 27.0–33.0)
MCHC: 34.1 g/dL (ref 32.0–36.0)
MCV: 92.6 fL (ref 80.0–100.0)
MPV: 9.4 fL (ref 7.5–12.5)
Monocytes Absolute: 770 cells/uL (ref 200–950)
Monocytes Relative: 14 %
Neutro Abs: 3355 cells/uL (ref 1500–7800)
Neutrophils Relative %: 61 %
Platelets: 145 10*3/uL (ref 140–400)
RBC: 5.13 MIL/uL (ref 4.20–5.80)
RDW: 13.2 % (ref 11.0–15.0)
WBC: 5.5 10*3/uL (ref 3.8–10.8)

## 2016-03-10 LAB — BASIC METABOLIC PANEL WITH GFR
BUN: 21 mg/dL (ref 7–25)
CO2: 28 mmol/L (ref 20–31)
Calcium: 9.2 mg/dL (ref 8.6–10.3)
Chloride: 101 mmol/L (ref 98–110)
Creat: 1.09 mg/dL (ref 0.70–1.33)
GFR, Est African American: 87 mL/min (ref >=60)
GFR, Est Non African American: 75 mL/min (ref >=60)
Glucose, Bld: 77 mg/dL (ref 65–99)
Potassium: 4.2 mmol/L (ref 3.5–5.3)
Sodium: 137 mmol/L (ref 135–146)

## 2016-03-10 LAB — HEPATIC FUNCTION PANEL
ALT: 18 U/L (ref 9–46)
AST: 23 U/L (ref 10–35)
Albumin: 4.3 g/dL (ref 3.6–5.1)
Alkaline Phosphatase: 37 U/L — ABNORMAL LOW (ref 40–115)
Bilirubin, Direct: 0.1 mg/dL (ref <=0.2)
Indirect Bilirubin: 0.5 mg/dL (ref 0.2–1.2)
Total Bilirubin: 0.6 mg/dL (ref 0.2–1.2)
Total Protein: 6.7 g/dL (ref 6.1–8.1)

## 2016-03-10 MED ORDER — TESTOSTERONE CYPIONATE 200 MG/ML IM SOLN
400.0000 mg | Freq: Once | INTRAMUSCULAR | Status: AC
  Administered 2016-03-10: 400 mg via INTRAMUSCULAR

## 2016-03-10 MED ORDER — SULFAMETHOXAZOLE-TRIMETHOPRIM 800-160 MG PO TABS: 1.0000 | ORAL_TABLET | Freq: Two times a day (BID) | ORAL | Status: DC

## 2016-03-10 MED ORDER — PREDNISONE 20 MG PO TABS: ORAL_TABLET | ORAL | Status: DC

## 2016-03-10 MED ORDER — TAMSULOSIN HCL 0.4 MG PO CAPS: 0.4000 mg | ORAL_CAPSULE | Freq: Every day | ORAL | Status: DC

## 2016-03-10 NOTE — Patient Instructions (Addendum)
Kidney Stones °Kidney stones (urolithiasis) are deposits that form inside your kidneys. The intense pain is caused by the stone moving through the urinary tract. When the stone moves, the ureter goes into spasm around the stone. The stone is usually passed in the urine.  °CAUSES  °· A disorder that makes certain neck glands produce too much parathyroid hormone (primary hyperparathyroidism). °· A buildup of uric acid crystals, similar to gout in your joints. °· Narrowing (stricture) of the ureter. °· A kidney obstruction present at birth (congenital obstruction). °· Previous surgery on the kidney or ureters. °· Numerous kidney infections. °SYMPTOMS  °· Feeling sick to your stomach (nauseous). °· Throwing up (vomiting). °· Blood in the urine (hematuria). °· Pain that usually spreads (radiates) to the groin. °· Frequency or urgency of urination. °DIAGNOSIS  °· Taking a history and physical exam. °· Blood or urine tests. °· CT scan. °· Occasionally, an examination of the inside of the urinary bladder (cystoscopy) is performed. °TREATMENT  °· Observation. °· Increasing your fluid intake. °· Extracorporeal shock wave lithotripsy--This is a noninvasive procedure that uses shock waves to break up kidney stones. °· Surgery may be needed if you have severe pain or persistent obstruction. There are various surgical procedures. Most of the procedures are performed with the use of small instruments. Only small incisions are needed to accommodate these instruments, so recovery time is minimized. °The size, location, and chemical composition are all important variables that will determine the proper choice of action for you. Talk to your health care provider to better understand your situation so that you will minimize the risk of injury to yourself and your kidney.  °HOME CARE INSTRUCTIONS  °· Drink enough water and fluids to keep your urine clear or pale yellow. This will help you to pass the stone or stone fragments. °· Strain  all urine through the provided strainer. Keep all particulate matter and stones for your health care provider to see. The stone causing the pain may be as small as a grain of salt. It is very important to use the strainer each and every time you pass your urine. The collection of your stone will allow your health care provider to analyze it and verify that a stone has actually passed. The stone analysis will often identify what you can do to reduce the incidence of recurrences. °· Only take over-the-counter or prescription medicines for pain, discomfort, or fever as directed by your health care provider. °· Keep all follow-up visits as told by your health care provider. This is important. °· Get follow-up X-rays if required. The absence of pain does not always mean that the stone has passed. It may have only stopped moving. If the urine remains completely obstructed, it can cause loss of kidney function or even complete destruction of the kidney. It is your responsibility to make sure X-rays and follow-ups are completed. Ultrasounds of the kidney can show blockages and the status of the kidney. Ultrasounds are not associated with any radiation and can be performed easily in a matter of minutes. °· Make changes to your daily diet as told by your health care provider. You may be told to: °¨ Limit the amount of salt that you eat. °¨ Eat 5 or more servings of fruits and vegetables each day. °¨ Limit the amount of meat, poultry, fish, and eggs that you eat. °· Collect a 24-hour urine sample as told by your health care provider. You may need to collect another urine sample every 6-12   months. °SEEK MEDICAL CARE IF: °· You experience pain that is progressive and unresponsive to any pain medicine you have been prescribed. °SEEK IMMEDIATE MEDICAL CARE IF:  °· Pain cannot be controlled with the prescribed medicine. °· You have a fever or shaking chills. °· The severity or intensity of pain increases over 18 hours and is not  relieved by pain medicine. °· You develop a new onset of abdominal pain. °· You feel faint or pass out. °· You are unable to urinate. °  °This information is not intended to replace advice given to you by your health care provider. Make sure you discuss any questions you have with your health care provider. °  °Document Released: 10/24/2005 Document Revised: 07/15/2015 Document Reviewed: 03/27/2013 °[employer]vier Interactive Patient Education ©2016 [employer] ° °

## 2016-03-10 NOTE — Progress Notes (Signed)
Subjective:    Patient ID: Jay Brooks, male    DOB: Apr 03, 1961, 55 y.o.   MRN: [id number]  HPI 55 y.o. WM presents with intermittent left flank pain x 3 months. Left posterior hip/back or hip pain, happened 2-3 x a week except yesterday was intermittent all day, feels sore, will be there for 2 mins at a time, nothing better/worse, not worse with movement. Has been having more fatigue than usual, everyday and today some nausea without vomiting. No fever, chills. Recent diagnosis of left inguinal hernia, scheduled surgery referral with Dr. Ninfa Linden but states very different from other pain. Has been having increased frequency, darker urine, BPH symptoms.   Did have oral surgery 2 weeks ago, getting upper implants, has been on ibuprofen and was on hydrocodone for 3 days.   Blood pressure 130/80, pulse 72, temperature 97.5 F (36.4 C), temperature source Temporal, resp. rate 16, height 5\' 9"  (1.753 m), weight 169 lb 9.6 oz (76.93 kg), SpO2 96 %.  Current Outpatient Prescriptions on File Prior to Visit  Medication Sig Dispense Refill  . aspirin 81 MG tablet Take 81 mg by mouth daily.    Marland Kitchen atovaquone-proguanil (MALARONE) 250-100 MG TABS Take 1 tablet by mouth daily. As needed for work    . azelastine (ASTELIN) 0.1 % nasal spray Place 2 sprays into both nostrils 2 (two) times daily. Use in each nostril as directed 30 mL 2  . calcium carbonate (TUMS - DOSED IN MG ELEMENTAL CALCIUM) 500 MG chewable tablet Chew 1 tablet by mouth daily.    . Chlorphen-PE-Acetaminophen 4-10-325 MG TABS Take 1 tablet by mouth 3 (three) times daily. 84 tablet 0  . desonide (DESOWEN) 0.05 % cream     . omeprazole (PRILOSEC) 40 MG capsule TAKE 1 CAPSULE TWICE DAILY FOR ONE MONTH THEN GO TO ONCE DAILY 60 capsule 2  . Probiotic Product (PROBIOTIC DAILY PO) Take 1 capsule by mouth daily.    . ranitidine (ZANTAC) 150 MG tablet Take 1 tablet (150 mg total) by mouth at bedtime. 30 tablet 0  . testosterone cypionate  (DEPOTESTOSTERONE CYPIONATE) 200 MG/ML injection Inject 2 mLs IM every 2 weeks or as directed 10 mL 5  . triamcinolone (NASACORT ALLERGY 24HR) 55 MCG/ACT AERO nasal inhaler Place 2 sprays into the nose daily.     No current facility-administered medications on file prior to visit.   Past Medical History  Diagnosis Date  . Chronic sinusitis   . Sleep deficient   . Diverticulosis of colon (without mention of hemorrhage)   . Lyme disease     Review of Systems  Constitutional: Negative for fever, chills, appetite change and fatigue.  HENT: Negative.   Respiratory: Negative.   Cardiovascular: Negative.   Gastrointestinal: Positive for nausea. Negative for vomiting, abdominal pain, diarrhea, constipation, blood in stool, abdominal distention, anal bleeding and rectal pain.  Genitourinary: Positive for frequency and flank pain. Negative for dysuria, urgency, hematuria, decreased urine volume, discharge, penile swelling, scrotal swelling, enuresis, difficulty urinating, genital sores, penile pain and testicular pain.  Musculoskeletal: Positive for back pain. Negative for myalgias, joint swelling, arthralgias, gait problem, neck pain and neck stiffness.  Skin: Negative.  Negative for rash.  Neurological: Negative.  Negative for weakness and numbness.       Objective:   Physical Exam  Constitutional: He is oriented to person, place, and time. He appears well-developed and well-nourished. No distress.  Cardiovascular: Normal rate and regular rhythm.   No murmur heard. Pulmonary/Chest: Effort normal  and breath sounds normal. No respiratory distress. He has no wheezes.  Abdominal: Soft. Bowel sounds are normal. He exhibits no distension. There is no tenderness.  Musculoskeletal: He exhibits no tenderness.  No CVA tenderness, mild left SI tenderness, negative straight leg, normal distal neurovascular exam bilateral legs, full ROM bilateral hips without pain.   Neurological: He is alert and  oriented to person, place, and time. No cranial nerve deficit. Coordination normal.  Normal gait  Skin: Skin is warm and dry. No rash noted. He is not diaphoretic.       Assessment & Plan:  1. Hypogonadism in male - testosterone cypionate (DEPOTESTOSTERONE CYPIONATE) injection 400 mg; Inject 2 mLs (400 mg total) into the muscle once.  2. Left flank pain/urinary frequency Some left SI tenderness to deep palpation, no fever, chills- does have history of kidney stones.  - Urinalysis, Routine w reflex microscopic (not at [facility]) - Urine culture - predniSONE (DELTASONE) 20 MG tablet; 2 tablets daily for 3 days, 1 tablet daily for 4 days.  Dispense: 10 tablet; Refill: 0 - sulfamethoxazole-trimethoprim (BACTRIM DS,SEPTRA DS) 800-160 MG tablet; Take 1 tablet by mouth 2 (two) times daily.  Dispense: 20 tablet; Refill: 0 - tamsulosin (FLOMAX) 0.4 MG CAPS capsule; Take 1 capsule (0.4 mg total) by mouth daily after supper.  Dispense: 30 capsule; Refill: 0 - CT Abdomen Pelvis W Wo Contrast; Future

## 2016-03-10 NOTE — Progress Notes (Signed)
Patient here for Testosterone Cypionate 200 mg/ml 2 ml IM injection in LEFT glut. Patient tolerated well.

## 2016-03-11 LAB — URINALYSIS, ROUTINE W REFLEX MICROSCOPIC
Bilirubin Urine: NEGATIVE
Glucose, UA: NEGATIVE
Hgb urine dipstick: NEGATIVE
Ketones, ur: NEGATIVE
Leukocytes, UA: NEGATIVE
Nitrite: NEGATIVE
Protein, ur: NEGATIVE
Specific Gravity, Urine: 1.018 (ref 1.001–1.035)
pH: 7 (ref 5.0–8.0)

## 2016-03-11 LAB — URINE CULTURE
Colony Count: NO GROWTH
Organism ID, Bacteria: NO GROWTH

## 2016-03-17 ENCOUNTER — Other Ambulatory Visit: Payer: Self-pay | Admitting: Internal Medicine

## 2016-03-17 ENCOUNTER — Other Ambulatory Visit: Payer: 59

## 2016-03-17 DIAGNOSIS — R109 Unspecified abdominal pain: Secondary | ICD-10-CM

## 2016-03-21 ENCOUNTER — Ambulatory Visit
Admission: RE | Admit: 2016-03-21 | Discharge: 2016-03-21 | Disposition: A | Discharge status: Home / Self Care | Payer: 59 | Source: Ambulatory Visit | Attending: Internal Medicine | Admitting: Internal Medicine

## 2016-03-21 ENCOUNTER — Other Ambulatory Visit: Payer: 59

## 2016-03-21 DIAGNOSIS — R109 Unspecified abdominal pain: Secondary | ICD-10-CM

## 2016-03-22 ENCOUNTER — Other Ambulatory Visit: Payer: Self-pay | Admitting: Surgery

## 2016-03-23 ENCOUNTER — Telehealth: Payer: Self-pay | Admitting: Internal Medicine

## 2016-03-23 DIAGNOSIS — R109 Unspecified abdominal pain: Secondary | ICD-10-CM

## 2016-03-23 DIAGNOSIS — N400 Enlarged prostate without lower urinary tract symptoms: Secondary | ICD-10-CM

## 2016-03-23 NOTE — Telephone Encounter (Signed)
Patient requests a referral to a Urologist. Please advise  Thanks Katrina

## 2016-03-30 ENCOUNTER — Ambulatory Visit (INDEPENDENT_AMBULATORY_CARE_PROVIDER_SITE_OTHER): Payer: 59 | Admitting: *Deleted

## 2016-03-30 DIAGNOSIS — E291 Testicular hypofunction: Secondary | ICD-10-CM | POA: Diagnosis not present

## 2016-03-30 MED ORDER — TESTOSTERONE CYPIONATE 200 MG/ML IM SOLN
400.0000 mg | Freq: Once | INTRAMUSCULAR | Status: AC
  Administered 2016-03-30: 400 mg via INTRAMUSCULAR

## 2016-03-30 NOTE — Progress Notes (Signed)
Patient ID: Jay Brooks, male   DOB: 1961/09/03, 55 y.o.   MRN: [id number] Patient presents for testosterone injections for hypogonadism Tx.  Patient received 2 cc IM right glute and tolerated well.

## 2016-04-15 ENCOUNTER — Ambulatory Visit (INDEPENDENT_AMBULATORY_CARE_PROVIDER_SITE_OTHER): Payer: 59 | Admitting: *Deleted

## 2016-04-15 DIAGNOSIS — E291 Testicular hypofunction: Secondary | ICD-10-CM

## 2016-04-15 MED ORDER — TESTOSTERONE CYPIONATE 200 MG/ML IM SOLN
400.0000 mg | Freq: Once | INTRAMUSCULAR | Status: AC
  Administered 2016-04-15: 400 mg via INTRAMUSCULAR

## 2016-04-15 NOTE — Progress Notes (Signed)
Patient ID: Jay Brooks, male   DOB: 08-15-1961, 55 y.o.   MRN: [id number] Patient presents for testosterone injection for hypogonadism Tx.  Patient received 2 cc IM left glute and tolerated well.

## 2016-05-13 ENCOUNTER — Ambulatory Visit (INDEPENDENT_AMBULATORY_CARE_PROVIDER_SITE_OTHER): Payer: 59 | Admitting: *Deleted

## 2016-05-13 DIAGNOSIS — E291 Testicular hypofunction: Secondary | ICD-10-CM

## 2016-05-13 MED ORDER — TESTOSTERONE CYPIONATE 200 MG/ML IM SOLN
400.0000 mg | Freq: Once | INTRAMUSCULAR | Status: AC
  Administered 2016-05-13: 400 mg via INTRAMUSCULAR

## 2016-05-13 NOTE — Progress Notes (Signed)
Patient ID: Jay Brooks, male   DOB: 1961-02-03, 55 y.o.   MRN: [id number] Patient here for NV for injection of Testosterone Cypionate 200 mg/ml 2 ml IM in right upper outer quadrant. Patient tolerated well.

## 2016-06-01 ENCOUNTER — Ambulatory Visit (INDEPENDENT_AMBULATORY_CARE_PROVIDER_SITE_OTHER): Payer: 59

## 2016-06-01 DIAGNOSIS — E291 Testicular hypofunction: Secondary | ICD-10-CM

## 2016-06-01 NOTE — Progress Notes (Signed)
Patient here for NV for injection of Testosterone Cypionate 200 mg/ml 2 ml IM in LEFT upper outer quadrant. Patient tolerated well.

## 2016-06-02 MED ORDER — TESTOSTERONE CYPIONATE 200 MG/ML IM SOLN
200.0000 mg | INTRAMUSCULAR | Status: DC
  Administered 2016-06-01 – 2016-07-14 (×2): 200 mg via INTRAMUSCULAR

## 2016-06-23 ENCOUNTER — Ambulatory Visit: Payer: 59

## 2016-06-23 MED ORDER — TESTOSTERONE CYPIONATE 200 MG/ML IM SOLN
200.0000 mg | INTRAMUSCULAR | Status: DC
  Administered 2016-06-23: 200 mg via INTRAMUSCULAR

## 2016-06-23 NOTE — Progress Notes (Signed)
Patient here for NV for injection of Testosterone Cypionate 200 mg/ml 2 ml IM LEFT upper outer quadrant. Patient tolerated well.

## 2016-06-24 ENCOUNTER — Ambulatory Visit: Payer: 59

## 2016-06-24 DIAGNOSIS — E291 Testicular hypofunction: Secondary | ICD-10-CM

## 2016-06-24 MED ORDER — TESTOSTERONE CYPIONATE 200 MG/ML IM SOLN
200.0000 mg | INTRAMUSCULAR | Status: DC
  Administered 2016-06-24 – 2016-09-15 (×3): 200 mg via INTRAMUSCULAR

## 2016-06-24 NOTE — Progress Notes (Signed)
Patient here for NV for injection of Testosterone Cypionate 200 mg/ml 2 ml IM LEFT upper outer quadrant. Patient tolerated well

## 2016-07-13 ENCOUNTER — Ambulatory Visit: Payer: Self-pay

## 2016-07-13 ENCOUNTER — Other Ambulatory Visit: Payer: Self-pay | Admitting: Internal Medicine

## 2016-07-14 ENCOUNTER — Ambulatory Visit: Payer: 59 | Admitting: *Deleted

## 2016-07-14 DIAGNOSIS — E291 Testicular hypofunction: Secondary | ICD-10-CM

## 2016-07-14 NOTE — Progress Notes (Signed)
Patient here for Testosterone injection of 200 mg/ml IM right upper outer quadrant. Patient tolerated well.

## 2016-08-05 ENCOUNTER — Ambulatory Visit: Payer: 59 | Admitting: *Deleted

## 2016-08-05 DIAGNOSIS — E291 Testicular hypofunction: Secondary | ICD-10-CM

## 2016-08-05 MED ORDER — TESTOSTERONE CYPIONATE 200 MG/ML IM SOLN
400.0000 mg | Freq: Once | INTRAMUSCULAR | Status: AC
  Administered 2016-08-05: 400 mg via INTRAMUSCULAR

## 2016-08-05 NOTE — Progress Notes (Signed)
Patient here for NV to receive Testosterone Cypionate injection 200 mg/ml 2 ml IM right upper outer quadrant.

## 2016-08-23 ENCOUNTER — Encounter: Payer: Self-pay | Admitting: Internal Medicine

## 2016-08-23 ENCOUNTER — Ambulatory Visit: Payer: 59 | Admitting: Internal Medicine

## 2016-08-23 VITALS — BP 122/70 | HR 82 | Temp 97.3°F | Resp 16 | Ht 69.0 in | Wt 174.0 lb

## 2016-08-23 DIAGNOSIS — E291 Testicular hypofunction: Secondary | ICD-10-CM

## 2016-08-23 DIAGNOSIS — J069 Acute upper respiratory infection, unspecified: Secondary | ICD-10-CM

## 2016-08-23 MED ORDER — PREDNISONE 20 MG PO TABS: ORAL_TABLET | ORAL | 0 refills | Status: DC

## 2016-08-23 MED ORDER — PSEUDOEPH-BROMPHEN-DM 30-2-10 MG/5ML PO SYRP: 5.0000 mL | ORAL_SOLUTION | Freq: Four times a day (QID) | ORAL | 0 refills | Status: DC | PRN

## 2016-08-23 MED ORDER — AZITHROMYCIN 250 MG PO TABS: ORAL_TABLET | ORAL | 0 refills | Status: DC

## 2016-08-23 MED ORDER — RANITIDINE HCL 300 MG PO TABS: 300.0000 mg | ORAL_TABLET | Freq: Every day | ORAL | 1 refills | Status: DC

## 2016-08-23 NOTE — Progress Notes (Signed)
HPI  Patient presents to the office for evaluation of sore throat, sinus pressure, and congestion.  It has been going on for 1 weeks.  Patient reports night > day, dry, barky, worse with lying down.  They also endorse change in voice, postnasal drip and nasal congestion, clear rhinorrhea, yellow nasal congestion, ears popping, scratchy throat. .  They have tried nasonex, astelin, claritin daily.  They report that nothing has worked.  They denies other sick contacts.  Review of Systems  Constitutional: Positive for malaise/fatigue. Negative for chills and fever.  HENT: Positive for congestion, ear pain, hearing loss and sore throat.   Respiratory: Positive for cough. Negative for sputum production, shortness of breath and wheezing.   Cardiovascular: Negative for chest pain, palpitations and leg swelling.  Neurological: Positive for headaches.    PE:  Vitals:   08/23/16 0923  BP: 122/70  Pulse: 82  Resp: 16  Temp: 97.3 F (36.3 C)   General:  Alert and non-toxic, WDWN, NAD HEENT: NCAT, PERLA, EOM normal, no occular discharge or erythema.  Nasal mucosal edema with sinus tenderness to palpation.  Oropharynx clear with minimal oropharyngeal edema and erythema.  Mucous membranes moist and pink. Neck:  Cervical adenopathy Chest:  RRR no MRGs.  Lungs clear to auscultation A&P with no wheezes rhonchi or rales.   Abdomen: +BS x 4 quadrants, soft, non-tender, no guarding, rigidity, or rebound. Skin: warm and dry no rash Neuro: A&Ox4, CN II-XII grossly intact  Assessment and Plan:   1. Acute URI  - predniSONE (DELTASONE) 20 MG tablet; 2 tablets daily for 3 days, 1 tablet daily for 4 days.  Dispense: 10 tablet; Refill: 0 - ranitidine (ZANTAC) 300 MG tablet; Take 1 tablet (300 mg total) by mouth at bedtime.  Dispense: 60 tablet; Refill: 1 - brompheniramine-pseudoephedrine-DM 30-2-10 MG/5ML syrup; Take 5 mLs by mouth 4 (four) times daily as needed.  Dispense: 120 mL; Refill: 0 - azithromycin  (ZITHROMAX Z-PAK) 250 MG tablet; 2 po day one, then 1 daily x 4 days  Dispense: 6 tablet; Refill: 0  2. Hypogonadism male -testosterone shot given here in office today as was due this week.

## 2016-08-31 ENCOUNTER — Encounter: Payer: Self-pay | Admitting: Internal Medicine

## 2016-08-31 ENCOUNTER — Ambulatory Visit: Payer: 59 | Admitting: Internal Medicine

## 2016-08-31 VITALS — BP 116/72 | HR 60 | Temp 98.0°F | Resp 16 | Ht 69.0 in | Wt 175.0 lb

## 2016-08-31 DIAGNOSIS — J069 Acute upper respiratory infection, unspecified: Secondary | ICD-10-CM

## 2016-08-31 MED ORDER — BENZONATATE 200 MG PO CAPS: 200.0000 mg | ORAL_CAPSULE | Freq: Three times a day (TID) | ORAL | 0 refills | Status: DC | PRN

## 2016-08-31 NOTE — Progress Notes (Signed)
   Subjective:    Patient ID: Jay Brooks, male    DOB: 1961/08/04, 55 y.o.   MRN: [id number]  HPI  Patient reports 1 week later for follow-up of upper respiratory symptoms.  He has finished both the zpak and also the prednisone.  He is feeling a little bit better.  He is not using the cough syrup.  He does not feel like it is working very well.  He reports that he is coughing a lot during the nighttime.  He is taking the ranitidine.  He is still taking the astelin and is also taking the nasacort.  He reports that this congestion has improved.  He did use afrin for a couple days.  He has discontinue the afrin.    Review of Systems  Constitutional: Positive for fatigue. Negative for chills and fever.  HENT: Positive for congestion and postnasal drip. Negative for ear pain, facial swelling, nosebleeds, rhinorrhea, sore throat, tinnitus, trouble swallowing and voice change.   Respiratory: Positive for cough. Negative for chest tightness, shortness of breath and wheezing.        Objective:   Physical Exam  Constitutional: He is oriented to person, place, and time. He appears well-developed and well-nourished. No distress.  HENT:  Head: Normocephalic.  Mouth/Throat: Oropharynx is clear and moist. No oropharyngeal exudate.  Eyes: Conjunctivae are normal. No scleral icterus.  Neck: Normal range of motion. Neck supple. No JVD present. No thyromegaly present.  Cardiovascular: Normal rate, regular rhythm, normal heart sounds and intact distal pulses.  Exam reveals no gallop and no friction rub.   No murmur heard. Pulmonary/Chest: Effort normal and breath sounds normal. No respiratory distress. He has no wheezes. He has no rales. He exhibits no tenderness.  Abdominal: Soft. Bowel sounds are normal. He exhibits no distension and no mass. There is no tenderness. There is no rebound and no guarding.  Musculoskeletal: Normal range of motion.  Lymphadenopathy:    He has no cervical adenopathy.   Neurological: He is alert and oriented to person, place, and time. No cranial nerve deficit. Coordination normal.  Skin: Skin is warm and dry. No rash noted. He is not diaphoretic.  Psychiatric: He has a normal mood and affect. His behavior is normal. Judgment and thought content normal.  Nursing note and vitals reviewed.   Vitals:   08/31/16 1124  BP: 116/72  Pulse: 60  Resp: 16  Temp: 98 F (36.7 C)          Assessment & Plan:    1. Acute URI -work note -tessalon -exam truly unremarkable patient is improved greatly

## 2016-09-05 ENCOUNTER — Other Ambulatory Visit: Payer: Self-pay | Admitting: Internal Medicine

## 2016-09-11 ENCOUNTER — Other Ambulatory Visit: Payer: Self-pay | Admitting: Gastroenterology

## 2016-09-15 ENCOUNTER — Ambulatory Visit (INDEPENDENT_AMBULATORY_CARE_PROVIDER_SITE_OTHER): Payer: 59 | Admitting: *Deleted

## 2016-09-15 DIAGNOSIS — Z23 Encounter for immunization: Secondary | ICD-10-CM

## 2016-09-15 DIAGNOSIS — E291 Testicular hypofunction: Secondary | ICD-10-CM

## 2016-09-15 NOTE — Progress Notes (Signed)
Patient presents for testosterone injection for hypogonadism Tx and flu shot.

## 2016-10-04 ENCOUNTER — Other Ambulatory Visit: Payer: Self-pay | Admitting: Internal Medicine

## 2016-10-05 ENCOUNTER — Other Ambulatory Visit: Payer: Self-pay | Admitting: *Deleted

## 2016-10-05 ENCOUNTER — Other Ambulatory Visit: Payer: Self-pay | Admitting: Internal Medicine

## 2016-10-05 NOTE — Telephone Encounter (Signed)
Testosterone was called into [facility]pharmacy.

## 2016-10-06 ENCOUNTER — Ambulatory Visit (INDEPENDENT_AMBULATORY_CARE_PROVIDER_SITE_OTHER): Payer: 59

## 2016-10-06 DIAGNOSIS — E291 Testicular hypofunction: Secondary | ICD-10-CM

## 2016-10-06 NOTE — Progress Notes (Signed)
    Patient presents for testosterone injection for hypogonadism  Given IM RIGHT 62mL glute Pt will return in 2wks for next injection.

## 2016-10-20 ENCOUNTER — Encounter (HOSPITAL_COMMUNITY): Payer: Self-pay | Admitting: Emergency Medicine

## 2016-10-20 ENCOUNTER — Emergency Department (HOSPITAL_COMMUNITY)
Admission: EM | Admit: 2016-10-20 | Discharge: 2016-10-21 | Disposition: A | Discharge status: Home / Self Care | Payer: 59 | Attending: Emergency Medicine | Admitting: Emergency Medicine

## 2016-10-20 DIAGNOSIS — Z7982 Long term (current) use of aspirin: Secondary | ICD-10-CM | POA: Insufficient documentation

## 2016-10-20 DIAGNOSIS — R040 Epistaxis: Secondary | ICD-10-CM | POA: Diagnosis not present

## 2016-10-20 HISTORY — DX: Gastro-esophageal reflux disease without esophagitis: K21.9

## 2016-10-20 MED ORDER — OXYMETAZOLINE HCL 0.05 % NA SOLN
2.0000 | Freq: Once | NASAL | Status: AC
  Administered 2016-10-20: 2 via NASAL
  Filled 2016-10-20: qty 15

## 2016-10-20 NOTE — ED Provider Notes (Signed)
[facility] DEPT Provider Note   CSN: [id number] Arrival date & time: 10/20/16  2216   By signing my name below, I, Eunice Blase, attest that this documentation has been prepared under the direction and in the presence of Daleen Bo, MD. Electronically signed, Eunice Blase, ED Scribe. 10/21/16. 1:21 AM.   History   Chief Complaint Chief Complaint  Patient presents with  . Epistaxis   The history is provided by the patient and the spouse. No language interpreter was used.    HPI Comments: Jay Brooks is a 55 y.o. male with a PMHx of multiple sinus infections who presents to the Emergency Department complaining of episodic right nostril epistaxis twice today. He states that he had his first episode of epistaxis this morning (lasting ~20 minutes), he managed and stopped the bleeding, and then it began again tonight PTA (lasted > 45 minutes from onset before he was evaluated). His nose is currently bleeding, and he is spitting blood that is draining from his nose into his throat from his mouth. Pt reports associated dryness in his nose and congestion. He reveals that he blew a solid object from his nose earlier today.  Wife notes that he has multiple prior incidences of pressure imbalances in his ears. The pt works as a Insurance underwriter. He further discloses that he takes blood thinners and nasacort daily, and he notes that his last sinus infection was in October (treated with z-pack). Pt denies associated fever or Hx of HTN.      Past Medical History:  Diagnosis Date  . Chronic sinusitis   . Diverticulosis of colon (without mention of hemorrhage)   . GERD (gastroesophageal reflux disease)   . Lyme disease   . Sleep deficient     Patient Active Problem List   Diagnosis Date Noted  . Prostatism 12/08/2015  . Prediabetes 12/01/2014  . Vitamin D deficiency 12/01/2014  . Hyperlipidemia 10/22/2014  . Medication management 10/22/2014  . Labile hypertension 10/22/2014  . Testosterone  Deficiency 08/20/2014  . Chronic cough 12/06/2012  . GERD (gastroesophageal reflux disease) 12/06/2012  . Chronic rhinitis 12/06/2012    Past Surgical History:  Procedure Laterality Date  . HERNIA REPAIR    . VASECTOMY         Home Medications    Prior to Admission medications   Medication Sig Start Date End Date Taking? Authorizing Provider  aspirin 81 MG tablet Take 81 mg by mouth daily.    Historical Provider, MD  atovaquone-proguanil (MALARONE) 250-100 MG TABS Take 1 tablet by mouth daily. As needed for work    Management consultant, MD  azelastine (ASTELIN) 0.1 % nasal spray USE 2 SPRAYS IN EACH NOSTRI TWICE A DAY AS DIRECTED 07/13/16   Unk Pinto, MD  azelastine (ASTELIN) 0.1 % nasal spray USE 2 SPRAYS IN EACH NOSTRI TWICE A DAY AS DIRECTED 09/05/16   Unk Pinto, MD  benzonatate (TESSALON) 200 MG capsule Take 1 capsule (200 mg total) by mouth 3 (three) times daily as needed for cough. 08/31/16   Courtney Forcucci, PA-C  calcium carbonate (TUMS - DOSED IN MG ELEMENTAL CALCIUM) 500 MG chewable tablet Chew 1 tablet by mouth daily.    Historical Provider, MD  Chlorphen-PE-Acetaminophen 4-10-325 MG TABS Take 1 tablet by mouth 3 (three) times daily. 02/01/16   Courtney Forcucci, PA-C  omeprazole (PRILOSEC) 40 MG capsule TAKE 1 CAPSULE TWICE DAILY FOR ONE MONTH THEN GO TO ONCE DAILY 09/12/16   Milus Banister, MD  Probiotic Product (PROBIOTIC DAILY  PO) Take 1 capsule by mouth daily.    Historical Provider, MD  ranitidine (ZANTAC) 300 MG tablet Take 1 tablet (300 mg total) by mouth at bedtime. 08/23/16 08/23/17  Courtney Forcucci, PA-C  tamsulosin (FLOMAX) 0.4 MG CAPS capsule Take 1 capsule (0.4 mg total) by mouth daily after supper. 03/10/16   Vicie Mutters, PA-C  testosterone cypionate (DEPOTESTOSTERONE CYPIONATE) 200 MG/ML injection INJECT 2ML EVERY 2 WEEKS OR AS DIRECTED 10/04/16   Vicie Mutters, PA-C  triamcinolone (NASACORT ALLERGY 24HR) 55 MCG/ACT AERO nasal inhaler Place 2  sprays into the nose daily.    Historical Provider, MD    Family History Family History  Problem Relation Age of Onset  . Breast cancer Maternal Grandmother   . Colon polyps Brother   . Colon polyps Maternal Uncle   . Cancer Mother 38    pancreatic  . Colon cancer Neg Hx     Social History Social History  Substance Use Topics  . Smoking status: Never Smoker  . Smokeless tobacco: Never Used  . Alcohol use Yes     Comment: occ     Allergies   Allegra [fexofenadine]; Doxycycline; Levaquin [levofloxacin]; and Other   Review of Systems Review of Systems  Constitutional: Negative for fever.  HENT: Positive for congestion and nosebleeds. Negative for sinus pain and sinus pressure.   Neurological: Negative for headaches.  All other systems reviewed and are negative.    Physical Exam Updated Vital Signs BP 123/96 (BP Location: Right Arm)   Pulse 92   Temp 97.8 F (36.6 C) (Oral)   Resp 18   SpO2 98%   Physical Exam  Constitutional: He is oriented to person, place, and time. He appears well-developed and well-nourished.  HENT:  Head: Normocephalic and atraumatic.  Right Ear: Tympanic membrane and external ear normal.  Left Ear: Tympanic membrane and external ear normal.  Nose: No nasal deformity. Epistaxis (right) is observed.  Bleeding from right nostril.  Eyes: Conjunctivae and EOM are normal. Pupils are equal, round, and reactive to light.  Neck: Normal range of motion and phonation normal. Neck supple.  Cardiovascular: Normal rate, regular rhythm and normal heart sounds.   Pulmonary/Chest: Effort normal and breath sounds normal. He exhibits no bony tenderness.  Abdominal: Soft. There is no tenderness.  Musculoskeletal: Normal range of motion.  Neurological: He is alert and oriented to person, place, and time. No cranial nerve deficit or sensory deficit. He exhibits normal muscle tone. Coordination normal.  Skin: Skin is warm, dry and intact.  Psychiatric: He  has a normal mood and affect. His behavior is normal. Judgment and thought content normal.  Nursing note and vitals reviewed.    ED Treatments / Results  DIAGNOSTIC STUDIES: Oxygen Saturation is 97% on RA, normal by my interpretation.    COORDINATION OF CARE: 1:21 AM Will order medications. Epistaxis management @ 11:24 PM. Discussed treatment plan with pt at bedside and pt agreed to plan.  Labs (all labs ordered are listed, but only abnormal results are displayed) Labs Reviewed - No data to display  EKG  EKG Interpretation None       Radiology No results found.  Procedures .Epistaxis Management Date/Time: 10/20/2016 11:24 PM Performed by: Daleen Bo Authorized by: Daleen Bo   Consent:    Consent obtained:  Verbal   Consent given by:  Patient   Risks discussed:  Pain Anesthesia (see MAR for exact dosages):    Anesthesia method:  None Procedure details:    Treatment site:  R anterior   Treatment method:  Silver nitrate   Treatment complexity:  Limited   Treatment episode: initial   Post-procedure details:    Assessment:  Bleeding decreased   Patient tolerance of procedure:  Tolerated well, no immediate complications   (including critical care time)  Medications Ordered in ED Medications  oxymetazoline (AFRIN) 0.05 % nasal spray 2 spray (2 sprays Each Nare Given 10/20/16 2323)     Initial Impression / Assessment and Plan / ED Course  I have reviewed the triage vital signs and the nursing notes.  Pertinent labs & imaging results that were available during my care of the patient were reviewed by me and considered in my medical decision making (see chart for details).  Clinical Course     Medications  oxymetazoline (AFRIN) 0.05 % nasal spray 2 spray (2 sprays Each Nare Given 10/20/16 2323)    Patient Vitals for the past 24 hrs:  BP Temp Temp src Pulse Resp SpO2  10/21/16 0045 123/96 - - 92 18 98 %  10/20/16 2222 (!) 179/114 97.8 F (36.6 C)  Oral 107 - 97 %    1:20 AM Reevaluation with update and discussion. After initial assessment and treatment, an updated evaluation reveals Patient is coughing now anterior nose is dry, no active bleeding. He is not coughing. Findings discussed with patient and all questions were answered. Hue Steveson L    Final Clinical Impressions(s) / ED Diagnoses   Final diagnoses:  Epistaxis    Epistaxis, etiology not clear, but likely from right Kiesselbach's plexus. Bleeding controlled with silver nitrate coagulation. Patient barely has chronic respiratory difficulty and uses nasal steroid, and nasal antihistamine medications. Lately, today started when he blew his nose. I doubt posterior nosebleed, or unstable hemodynamic status.  Nursing Notes Reviewed/ Care Coordinated Applicable Imaging Reviewed Interpretation of Laboratory Data incorporated into ED treatment  The patient appears reasonably screened and/or stabilized for discharge and I doubt any other medical condition or other [facility] requiring further screening, evaluation, or treatment in the ED at this time prior to discharge.  Plan: Home Medications- continue, Afrin prn beginning tonight; Home Treatments- don't blow nose; return here if the recommended treatment, does not improve the symptoms; Recommended follow up- ENT and PCP prn   New Prescriptions New Prescriptions   No medications on file  I personally performed the services described in this documentation, which was scribed in my presence. The recorded information has been reviewed and is accurate.     Daleen Bo, MD 10/21/16 [phone]

## 2016-10-20 NOTE — ED Triage Notes (Signed)
Pt states he blew his nose this morning and then it started bleeding  Pt states it bled for about 20 minutes then quit  Pt states it started again this evening and he could not get it to quit  Pt states it has been bleeding for about 45 minutes now

## 2016-10-20 NOTE — ED Notes (Signed)
Ice applied to pt bridge of nose. Pt bleeding has stopped at this time after cautery by EDP

## 2016-10-20 NOTE — ED Notes (Signed)
Per EDP order pt asked to blow nose, large clot was dislodged and pt began dripping (almost pouring) blood from right nare.  Gave afrin anyways without success.  Bleeding continues and pt is holding nose but continues to have to spit blood and switch out tissues due to drippage despite pressure.

## 2016-10-21 NOTE — ED Notes (Signed)
Pt ambulatory and independent at discharge.  Verbalized understanding of discharge instructions 

## 2016-10-21 NOTE — Discharge Instructions (Signed)
Do not blow your nose, for several more days.  You can start using Afrin tomorrow evening.  Return here, if needed, for problems.

## 2016-11-02 ENCOUNTER — Ambulatory Visit: Payer: 59

## 2016-11-07 DIAGNOSIS — K5792 Diverticulitis of intestine, part unspecified, without perforation or abscess without bleeding: Secondary | ICD-10-CM

## 2016-11-07 HISTORY — DX: Diverticulitis of intestine, part unspecified, without perforation or abscess without bleeding: K57.92

## 2016-11-11 ENCOUNTER — Telehealth: Payer: Self-pay | Admitting: *Deleted

## 2016-11-11 ENCOUNTER — Other Ambulatory Visit: Payer: Self-pay | Admitting: *Deleted

## 2016-11-11 DIAGNOSIS — R062 Wheezing: Secondary | ICD-10-CM | POA: Diagnosis not present

## 2016-11-11 DIAGNOSIS — J209 Acute bronchitis, unspecified: Secondary | ICD-10-CM | POA: Diagnosis not present

## 2016-11-11 NOTE — Telephone Encounter (Signed)
Patient requested a refill on Advair.  Per Starlyn Skeans, PA, this med was given for an acute bronchitis and is usually given to patient with asthma or COPD.  There is no refill at this time.  A message was left to inform the patient.

## 2016-11-14 ENCOUNTER — Encounter: Payer: Self-pay | Admitting: Internal Medicine

## 2016-11-14 ENCOUNTER — Ambulatory Visit (INDEPENDENT_AMBULATORY_CARE_PROVIDER_SITE_OTHER): Payer: 59 | Admitting: Internal Medicine

## 2016-11-14 VITALS — BP 136/82 | HR 88 | Temp 97.9°F | Resp 16 | Ht 69.0 in | Wt 170.8 lb

## 2016-11-14 DIAGNOSIS — J0141 Acute recurrent pansinusitis: Secondary | ICD-10-CM | POA: Diagnosis not present

## 2016-11-14 DIAGNOSIS — J209 Acute bronchitis, unspecified: Secondary | ICD-10-CM

## 2016-11-14 MED ORDER — DOXYCYCLINE HYCLATE 100 MG PO CAPS: ORAL_CAPSULE | ORAL | 0 refills | Status: DC

## 2016-11-14 MED ORDER — PREDNISONE 20 MG PO TABS: ORAL_TABLET | ORAL | 0 refills | Status: DC

## 2016-11-14 NOTE — Patient Instructions (Signed)

## 2016-11-14 NOTE — Progress Notes (Signed)
[facility]   Unk Pinto, M.D.    Uvaldo Bristle. Silverio Lay, P.A.-C      Starlyn Skeans, P.A.-C  [facility]                [address] [place], [place] [id number] Telephone [phone] Telefax [phone]  Subjective:    Patient ID: Jay Brooks, male    DOB: February 02, 1961, 56 y.o.   MRN: [id number]  HPI   This nice 56 yo MWM with hx/o Chronic allergies and recurrent sinusitis was seen at an Urgent Care on 11/11/2016 and dx'd/tx'd for a bronchitis with a Z-pak and 3 days of prednisone and he persists on the 4th day of Azithromycin with a productive cough, fever & chills.   Medication Sig  . aspirin 81 MG tablet Take 81 mg by mouth daily.  Marland Kitchen atovaquone-proguanil (MALARONE) 250-100 MG TABS Take 1 tablet by mouth daily. As needed for work  . azelastine (ASTELIN) 0.1 % nasal spray USE 2 SPRAYS IN EACH NOSTRI TWICE A DAY AS DIRECTED  . azelastine (ASTELIN) 0.1 % nasal spray USE 2 SPRAYS IN EACH NOSTRI TWICE A DAY AS DIRECTED  . benzonatate (TESSALON) 200 MG capsule Take 1 capsule (200 mg total) by mouth 3 (three) times daily as needed for cough.  . calcium carbonate (TUMS - DOSED IN MG ELEMENTAL CALCIUM) 500 MG chewable tablet Chew 1 tablet by mouth daily.  . Chlorphen-PE-Acetaminophen 4-10-325 MG TABS Take 1 tablet by mouth 3 (three) times daily.  . Probiotic Product (PROBIOTIC DAILY PO) Take 1 capsule by mouth daily.  . ranitidine (ZANTAC) 300 MG tablet Take 1 tablet (300 mg total) by mouth at bedtime.  . tamsulosin (FLOMAX) 0.4 MG CAPS capsule Take 1 capsule (0.4 mg total) by mouth daily after supper.  . testosterone cypionate (DEPOTESTOSTERONE CYPIONATE) 200 MG/ML injection INJECT 2ML EVERY 2 WEEKS OR AS DIRECTED  . triamcinolone (NASACORT ALLERGY 24HR) 55 MCG/ACT AERO nasal inhaler Place 2 sprays into the nose daily.  Marland Kitchen omeprazole (PRILOSEC) 40 MG capsule TAKE 1 CAPSULE TWICE DAILY FOR ONE MONTH THEN GO TO  ONCE DAILY   Allergies  Allergen Reactions  . Allegra [Fexofenadine]   . Doxycycline Nausea Only  . Levaquin [Levofloxacin] Nausea And Vomiting    Patient states that after he took levaquin this past time he had nausea with vomiting  . Other     Benzol Peroxide- caused blisters   Past Medical History:  Diagnosis Date  . Chronic sinusitis   . Diverticulosis of colon (without mention of hemorrhage)   . GERD (gastroesophageal reflux disease)   . Lyme disease   . Sleep deficient    Past Surgical History:  Procedure Laterality Date  . HERNIA REPAIR    . VASECTOMY     Review of Systems  10 point systems review negative except as above.    Objective:   Physical Exam  BP 136/82   Pulse 88   Temp 97.9 F (36.6 C)   Resp 16   Ht 5\' 9"  (1.753 m)   Wt 170 lb 12.8 oz (77.5 kg)   BMI 25.22 kg/m   Congested cough. No stridor.   HEENT - Eac's patent. TM's Nl. EOM's full. Sl max tenderness. PERRLA. NasoOroPharynx clear. Neck - supple. Nl Thyroid. Carotids 2+ & No bruits, nodes, JVD Chest - Scattered coarse rales and  Rhonchi and no  wheezes. Cor - Nl HS. RRR w/o sig M. MS- FROM w/o deformities.  Gait Nl. Neuro -  Nl w/o focal abnormalities. Skin - clear w/o rash, cyanosis or pallor.     Assessment & Plan:   1. Acute bronchitis   2. Acute recurrent pansinusitis  - doxycycline (VIBRAMYCIN) 100 MG capsule; Take 1 capsule 2 x/day for 5 days, then 1 x/day with food for Infection  Dispense: 15 capsule; Refill: 0 - predniSONE (DELTASONE) 20 MG tablet; 1 tab 3 x day for 3 days, then 1 tab 2 x day for 3 days, then 1 tab 1 x day for 5 days  Dispense: 20 tablet; Refill: 0

## 2016-11-25 ENCOUNTER — Ambulatory Visit (INDEPENDENT_AMBULATORY_CARE_PROVIDER_SITE_OTHER): Payer: 59 | Admitting: Internal Medicine

## 2016-11-25 ENCOUNTER — Encounter: Payer: Self-pay | Admitting: Internal Medicine

## 2016-11-25 VITALS — BP 112/72 | HR 87 | Temp 97.7°F | Wt 172.6 lb

## 2016-11-25 DIAGNOSIS — R05 Cough: Secondary | ICD-10-CM

## 2016-11-25 DIAGNOSIS — R059 Cough, unspecified: Secondary | ICD-10-CM

## 2016-11-25 DIAGNOSIS — E349 Endocrine disorder, unspecified: Secondary | ICD-10-CM

## 2016-11-25 MED ORDER — TESTOSTERONE CYPIONATE 200 MG/ML IM SOLN
400.0000 mg | Freq: Once | INTRAMUSCULAR | Status: AC
  Administered 2016-11-25: 400 mg via INTRAMUSCULAR

## 2016-11-25 MED ORDER — TESTOSTERONE CYPIONATE 200 MG/ML IM SOLN
INTRAMUSCULAR | 0 refills | Status: DC
Start: 2016-11-25 — End: 2017-06-27

## 2016-11-25 NOTE — Progress Notes (Signed)
HPI  Patient presents to the office for evaluation of cough.  It has been going on for 2 weeks.  Patient reports all the time, dry, barky.  They also endorse nasal congestion.  No sore throat, no ear pain, no shortness of breath or wheezing.  .  They have tried doxycycline, and prednisone.  He was taking some mucinex but has not been taking anything else over the counter other than nasacort.  They report that nothing has worked.  They denies other sick contacts.  He is not using astelin.  He is not using the inhaler.  He reports that he didn't see an effect at all.    Review of Systems  Constitutional: Positive for malaise/fatigue. Negative for chills and fever.  HENT: Positive for congestion, ear pain, hearing loss and sore throat.   Respiratory: Positive for cough. Negative for sputum production, shortness of breath and wheezing.   Cardiovascular: Negative for chest pain, palpitations and leg swelling.  Neurological: Positive for headaches.    PE:  Vitals:   11/25/16 1130  BP: 112/72  Pulse: 87  Temp: 97.7 F (36.5 C)    General:  Alert and non-toxic, WDWN, NAD HEENT: NCAT, PERLA, EOM normal, no occular discharge or erythema.  Nasal mucosal edema with sinus tenderness to palpation.  Oropharynx clear with minimal oropharyngeal edema and erythema.  Mucous membranes moist and pink. Neck:  Cervical adenopathy Chest:  RRR no MRGs.  Lungs clear to auscultation A&P with no wheezes rhonchi or rales.   Abdomen: +BS x 4 quadrants, soft, non-tender, no guarding, rigidity, or rebound. Skin: warm and dry no rash Neuro: A&Ox4, CN II-XII grossly intact  Assessment and Plan:   1. Cough -likely the resolving cough related to his recent bout of bronchitis.  -did offer both phenergan DM and tessalon which patient declined stating that they "don't work". -gave sample of dulera 100 2 puffs BID for the duration of the cough -restart astelin -patient given reassurance.    2.  Testosterone  Deficiency -injection given here in the office today

## 2016-11-25 NOTE — Addendum Note (Signed)
Addended by: Obadiah Dennard A on: 11/25/2016 12:07 PM   Modules accepted: Orders

## 2016-12-13 DIAGNOSIS — R040 Epistaxis: Secondary | ICD-10-CM | POA: Diagnosis not present

## 2016-12-21 ENCOUNTER — Encounter: Payer: Self-pay | Admitting: Internal Medicine

## 2016-12-27 ENCOUNTER — Ambulatory Visit: Payer: 59

## 2017-01-18 ENCOUNTER — Encounter: Payer: Self-pay | Admitting: Internal Medicine

## 2017-01-20 ENCOUNTER — Ambulatory Visit (INDEPENDENT_AMBULATORY_CARE_PROVIDER_SITE_OTHER): Payer: 59

## 2017-01-20 ENCOUNTER — Ambulatory Visit: Payer: Self-pay

## 2017-01-20 DIAGNOSIS — R3915 Urgency of urination: Secondary | ICD-10-CM | POA: Diagnosis not present

## 2017-01-20 DIAGNOSIS — E291 Testicular hypofunction: Secondary | ICD-10-CM

## 2017-01-20 DIAGNOSIS — N401 Enlarged prostate with lower urinary tract symptoms: Secondary | ICD-10-CM | POA: Diagnosis not present

## 2017-01-20 DIAGNOSIS — R350 Frequency of micturition: Secondary | ICD-10-CM | POA: Diagnosis not present

## 2017-01-20 MED ORDER — TESTOSTERONE CYPIONATE 200 MG/ML IM SOLN
200.0000 mg | INTRAMUSCULAR | Status: DC
  Administered 2017-01-20: 200 mg via INTRAMUSCULAR

## 2017-01-20 NOTE — Progress Notes (Signed)
Pt present for testosterone injections which was given in right glute. 2 mLs injected IM w/o issues or concerns at this time.

## 2017-01-21 ENCOUNTER — Other Ambulatory Visit: Payer: Self-pay | Admitting: Physician Assistant

## 2017-01-21 DIAGNOSIS — R109 Unspecified abdominal pain: Secondary | ICD-10-CM

## 2017-01-21 DIAGNOSIS — N400 Enlarged prostate without lower urinary tract symptoms: Secondary | ICD-10-CM

## 2017-01-21 DIAGNOSIS — R350 Frequency of micturition: Secondary | ICD-10-CM

## 2017-02-17 ENCOUNTER — Ambulatory Visit: Payer: 59

## 2017-02-17 DIAGNOSIS — E291 Testicular hypofunction: Secondary | ICD-10-CM

## 2017-02-17 MED ORDER — TESTOSTERONE CYPIONATE 200 MG/ML IM SOLN
200.0000 mg | INTRAMUSCULAR | Status: DC
  Administered 2017-02-17: 200 mg via INTRAMUSCULAR

## 2017-02-17 NOTE — Progress Notes (Signed)
Pt present for testosterone injections which was given in RIGHT glute. 2 mLs injected IM w/o issues or concerns at this time.

## 2017-03-17 ENCOUNTER — Ambulatory Visit (INDEPENDENT_AMBULATORY_CARE_PROVIDER_SITE_OTHER): Payer: 59 | Admitting: *Deleted

## 2017-03-17 DIAGNOSIS — E291 Testicular hypofunction: Secondary | ICD-10-CM

## 2017-03-17 MED ORDER — TESTOSTERONE CYPIONATE 200 MG/ML IM SOLN
400.0000 mg | Freq: Once | INTRAMUSCULAR | Status: AC
  Administered 2017-03-17: 400 mg via INTRAMUSCULAR

## 2017-03-17 NOTE — Progress Notes (Signed)
Patient here for NV for Testosterone Cypionate injection 200 mg /ml 2 ml IM in left upper outer quadrant. Patient tolerated well.

## 2017-04-05 DIAGNOSIS — G4700 Insomnia, unspecified: Secondary | ICD-10-CM | POA: Diagnosis not present

## 2017-04-05 DIAGNOSIS — R5381 Other malaise: Secondary | ICD-10-CM | POA: Diagnosis not present

## 2017-04-18 ENCOUNTER — Ambulatory Visit: Payer: 59 | Admitting: *Deleted

## 2017-04-18 DIAGNOSIS — E291 Testicular hypofunction: Secondary | ICD-10-CM | POA: Diagnosis not present

## 2017-04-18 MED ORDER — TESTOSTERONE CYPIONATE 200 MG/ML IM SOLN
400.0000 mg | Freq: Once | INTRAMUSCULAR | Status: AC
  Administered 2017-04-18: 400 mg via INTRAMUSCULAR

## 2017-04-18 NOTE — Progress Notes (Signed)
Patient here for a NV for injection of Testosterone Cypionate 200 mg/ml 2 ml IM right upper quadrant Patient tolerated well.

## 2017-05-09 ENCOUNTER — Encounter: Payer: Self-pay | Admitting: Internal Medicine

## 2017-05-29 ENCOUNTER — Ambulatory Visit (INDEPENDENT_AMBULATORY_CARE_PROVIDER_SITE_OTHER): Payer: 59

## 2017-05-29 DIAGNOSIS — E291 Testicular hypofunction: Secondary | ICD-10-CM | POA: Diagnosis not present

## 2017-05-29 MED ORDER — TESTOSTERONE CYPIONATE 200 MG/ML IM SOLN
200.0000 mg | INTRAMUSCULAR | Status: DC
  Administered 2017-05-29: 200 mg via INTRAMUSCULAR

## 2017-05-29 NOTE — Progress Notes (Signed)
Patient here for a NV for injection of Testosterone Cypionate 200 mg/ml 2 ml IM left upper quadrant Patient tolerated well.

## 2017-05-30 ENCOUNTER — Ambulatory Visit: Payer: Self-pay

## 2017-06-13 ENCOUNTER — Encounter: Payer: Self-pay | Admitting: Internal Medicine

## 2017-06-27 ENCOUNTER — Other Ambulatory Visit: Payer: Self-pay | Admitting: Internal Medicine

## 2017-06-27 NOTE — Telephone Encounter (Signed)
P[lease call Depo Test 

## 2017-06-28 DIAGNOSIS — M79671 Pain in right foot: Secondary | ICD-10-CM | POA: Diagnosis not present

## 2017-06-28 DIAGNOSIS — M722 Plantar fascial fibromatosis: Secondary | ICD-10-CM | POA: Diagnosis not present

## 2017-06-28 DIAGNOSIS — M65871 Other synovitis and tenosynovitis, right ankle and foot: Secondary | ICD-10-CM | POA: Diagnosis not present

## 2017-06-29 ENCOUNTER — Ambulatory Visit (INDEPENDENT_AMBULATORY_CARE_PROVIDER_SITE_OTHER): Payer: 59 | Admitting: *Deleted

## 2017-06-29 DIAGNOSIS — E291 Testicular hypofunction: Secondary | ICD-10-CM | POA: Diagnosis not present

## 2017-06-29 MED ORDER — TESTOSTERONE CYPIONATE 200 MG/ML IM SOLN: 400.0000 mg | Freq: Once | INTRAMUSCULAR | Status: DC

## 2017-06-29 NOTE — Progress Notes (Signed)
Patient here for a NV for his Testosterone Cypionate 200 mg/ml 2 ml  IM injection in the right upper outer quadrant. Patient tolerated well and will return in 3 weeks for his next injection.

## 2017-07-05 ENCOUNTER — Telehealth: Payer: Self-pay | Admitting: Gastroenterology

## 2017-07-06 NOTE — Telephone Encounter (Signed)
Pt advised he can continue his omeprazole 40 mg BID and zantac 150 mg qhs.  He will keep appt for 9/14 with Amy

## 2017-07-10 NOTE — Patient Instructions (Signed)

## 2017-07-10 NOTE — Progress Notes (Signed)
[facility]   Unk Pinto, M.D.      Uvaldo Bristle. Silverio Lay, P.A.-C [facility]                [address] [place] [address] Telephone [phone] Telefax [phone] Annual  Screening/Preventative Visit  & Comprehensive Evaluation & Examination     This very nice 56 y.o. MWM presents for a Screening/Preventative Visit & comprehensive evaluation and management of multiple medical co-morbidities.  Patient has been followed for labile HTN, Prediabetes, Hyperlipidemia, Testosterone Deficiency, BPH/LUTS and Vitamin D Deficiency. Patient has GERD controlled  Since increasing his Prilosec 40 mg and Ranitidine to BID (both) and has f/u pending with Dr Ardis Hughs. .      Patient has been followed expectantly for labile HTN since 2005. Patient's BP has been controlled at home.  Today's BP is at goal - 130/80. Patient denies any cardiac symptoms as chest pain, palpitations, shortness of breath, dizziness or ankle swelling.     Patient's lipids are controlled with diet. Patient denies myalgias or other medication SE's. Last lipids were at goal albeit elevated Trig's.  Lab Results  Component Value Date   CHOL 155 12/08/2015   HDL 39 (L) 12/08/2015   LDLCALC 73 12/08/2015   TRIG 213 (H) 12/08/2015   CHOLHDL 4.0 12/08/2015      Patient has Insulin Resistance / prediabetes with A1c 5.0% and elevated Insulin 55 in Oct 2014. He denies reactive hypoglycemic symptoms, visual blurring, diabetic polys or paresthesias. Last A1c was 5.1% with Nl Insulin 9.1 in Jan 2017.      Patient haas hx/ o Low Testosterone ("150" in 2010).  Finally, patient has history of Vitamin D Deficiency ("32" in 2009)  and last vitamin D was 57 - near goal (70-100):  Current Outpatient Prescriptions on File Prior to Visit  Medication Sig  . aspirin 81 MG tablet Take 81 mg by mouth daily.  Marland Kitchen atovaquone-proguanil (MALARONE) 250-100 MG TABS Take  1 tablet by mouth daily. As needed for work  . azelastine (ASTELIN) 0.1 % nasal spray 2 SPRAYS IN  NOSTRILs  TWICE A DAY   . calcium carbonate (TUMS) 500 MG  Chew 1 tablet by mouth daily.  Marland Kitchen CLEOCIN T 1 % lotion   . Probiotic Take 1 cap daily.   Omeprazole 40 mg  Take 1 cap 2 x / day   . ranitidine 300 MG tablet Take 1 tab 2 x  daily.   Marland Kitchen testosterone cypio  200 MG injec INJ 2 ML IM EVERY 2 WEEKS  . NASACORT nasal inhaler Place 2 sprays into the nose daily.  Enid Cutter HFA   inhaler    Allergies  Allergen Reactions  . Allegra [Fexofenadine]   . Doxycycline Nausea Only  . Levaquin [Levofloxacin] Nausea And Vomiting    Patient states that after he took levaquin this past time he had nausea with vomiting  . Other     Benzol Peroxide- caused blisters   Past Medical History:  Diagnosis Date  . Chronic sinusitis   . Diverticulosis of colon (without mention of hemorrhage)   . GERD (gastroesophageal reflux disease)   . Lyme disease   . Sleep deficient    Health Maintenance  Topic Date Due  . Hepatitis C Screening  1961-09-10  . HIV Screening  02/04/1974  . TETANUS/TDAP  02/04/1978  . COLONOSCOPY  02/04/2009  . INFLUENZA VACCINE  06/07/2017   Immunization History  Administered Date(s) Administered  . Influenza Split 08/07/2012, 09/25/2014  . Influenza, Seasonal, Injecte, Preservative Fre 09/25/2015  . Influenza,inj,quad, With Preservative 09/15/2016  . PPD Test 12/01/2014, 12/08/2015  . Tdap 07/08/2010   Past Surgical History:  Procedure Laterality Date  . HERNIA REPAIR    . VASECTOMY     Family History  Problem Relation Age of Onset  . Breast cancer Maternal Grandmother   . Colon polyps Brother   . Colon polyps Maternal Uncle   . Cancer Mother 71       pancreatic  . Colon cancer Neg Hx    Social History   Social History  . Marital status: Married    Spouse name: N/A  . Number of children: 1  . Years of education: N/A   Occupational History  . PILOT [employer]   Social History Main Topics  . Smoking status: Never Smoker  . Smokeless tobacco: Never Used  . Alcohol use Yes     Comment: occ  . Drug use: No  . Sexual activity: Not on file   Other Topics Concern  . Not on file   Social History Narrative   Daily caffeine     ROS Constitutional: Denies fever, chills, weight loss/gain, headaches, insomnia,  night sweats or change in appetite. Does c/o fatigue. Eyes: Denies redness, blurred vision, diplopia, discharge, itchy or watery eyes.  ENT: Denies discharge, congestion, post nasal drip, epistaxis, sore throat, earache, hearing loss, dental pain, Tinnitus, Vertigo, Sinus pain or snoring.  Cardio: Denies chest pain, palpitations, irregular heartbeat, syncope, dyspnea, diaphoresis, orthopnea, PND, claudication or edema Respiratory: denies cough, dyspnea, DOE, pleurisy, hoarseness, laryngitis or wheezing.  Gastrointestinal: Denies dysphagia, heartburn, reflux, water brash, pain, cramps, nausea, vomiting, bloating, diarrhea, constipation, hematemesis, melena, hematochezia, jaundice or hemorrhoids Genitourinary: Denies dysuria, frequency, urgency, nocturia, hesitancy, discharge, hematuria or flank pain Musculoskeletal: Denies arthralgia, myalgia, stiffness, Jt. Swelling, pain, limp or strain/sprain. Denies Falls. Skin: Denies puritis, rash, hives, warts, acne, eczema or change in skin lesion Neuro: No weakness, tremor, incoordination, spasms, paresthesia or pain Psychiatric: Denies confusion, memory loss or sensory loss. Denies Depression. Endocrine: Denies change in weight, skin, hair change, nocturia, and paresthesia, diabetic polys, visual blurring or hyper / hypo glycemic episodes.  Heme/Lymph: No excessive bleeding, bruising or enlarged lymph nodes.  Physical Exam  BP 130/80   Pulse 68   Temp (!) 97.3 F (36.3 C)   Resp 16   Ht 5\' 9"  (1.753 m)   Wt 167 lb 3.2 oz (75.8 kg)   BMI 24.69 kg/m   General Appearance: Well nourished  and well groomed and in no apparent distress.  Eyes: PERRLA, EOMs, conjunctiva no swelling or erythema, normal fundi and vessels. Sinuses: No frontal/maxillary tenderness ENT/Mouth: EACs patent / TMs  nl. Nares clear without erythema, swelling, mucoid exudates. Oral hygiene is good. No erythema, swelling, or exudate. Tongue normal, non-obstructing. Tonsils not swollen or erythematous. Hearing normal.  Neck: Supple, thyroid normal. No bruits, nodes or JVD. Respiratory: Respiratory effort normal.  BS equal and clear bilateral without rales, rhonci, wheezing or stridor. Cardio: Heart sounds are normal with regular rate and rhythm and no murmurs, rubs or gallops. Peripheral pulses are normal and equal bilaterally without edema. No aortic or femoral bruits. Chest: symmetric with normal excursions and percussion.  Abdomen: Soft, with Nl bowel sounds. Nontender, no guarding, rebound, hernias, masses, or organomegaly.  Lymphatics: Non tender without lymphadenopathy.  Genitourinary:  No hernias.Testes nl. DRE - prostate nl for age - smooth & firm w/o nodules. Musculoskeletal: Full ROM all peripheral extremities, joint stability, 5/5 strength, and normal gait. Skin: Warm and dry without rashes, lesions, cyanosis, clubbing or  ecchymosis.  Neuro: Cranial nerves intact, reflexes equal bilaterally. Normal muscle tone, no cerebellar symptoms. Sensation intact.  Pysch: Alert and oriented X 3 with normal affect, insight and judgment appropriate.   Assessment and Plan  1. Annual Preventative/Screening Exam   2. Labile hypertension  - EKG 12-Lead - [place], RETROPERITNL ABD,  LTD - Microalbumin / creatinine urine ratio - CBC with Differential/Platelet - BASIC METABOLIC PANEL WITH GFR - Magnesium - TSH  3. Hyperlipidemia, mixed  - EKG 12-Lead - [place], RETROPERITNL ABD,  LTD - Hepatic function panel - Lipid panel - TSH  4. Prediabetes  - EKG 12-Lead - [place], RETROPERITNL ABD,  LTD - Hemoglobin A1c -  Insulin, fasting  5. Vitamin D deficiency  - VITAMIN D 25 Hydroxy   6. Testosterone Deficiency  - Testosterone  7. Benign localized prostatic hyperplasia with lower urinary tract symptoms (LUTS)  - PSA  8. Gastroesophageal reflux disease  - f/u pending w/Dr Ardis Hughs  9. Screening for rectal cancer  - POC Hemoccult Bld/Stl  - CBC with Differential/Platelet  10. Prostate cancer screening  - PSA  11. Screening examination for pulmonary tuberculosis   12. Screening for ischemic heart disease  - EKG 12-Lead - Lipid panel  13. Screening for AAA (aortic abdominal aneurysm)  - [place], RETROPERITNL ABD,  LTD  14. Fatigue  - Vitamin B12 - Iron,Total/Total Iron Binding Cap - Testosterone - CBC with Differential/Platelet - TSH  15. Medication management  - Microalbumin / creatinine urine ratio - Vitamin B12 - Iron,Total/Total Iron Binding Cap - Testosterone - CBC with Differential/Platelet - BASIC METABOLIC PANEL WITH GFR - Hepatic function panel - Magnesium - Lipid panel - TSH - Hemoglobin A1c - Insulin, fasting - VITAMIN D 25 Hydroxy        Patient was counseled in prudent diet, weight control to achieve/maintain BMI less than 25, BP monitoring, regular exercise and medications as discussed.  Discussed med effects and SE's. Routine screening labs and tests as requested with regular follow-up as recommended. Over 40 minutes of exam, counseling, chart review and high complex critical decision making was performed

## 2017-07-11 ENCOUNTER — Encounter: Payer: Self-pay | Admitting: Internal Medicine

## 2017-07-11 ENCOUNTER — Ambulatory Visit (INDEPENDENT_AMBULATORY_CARE_PROVIDER_SITE_OTHER): Payer: 59 | Admitting: Internal Medicine

## 2017-07-11 VITALS — BP 130/80 | HR 68 | Temp 97.3°F | Resp 16 | Ht 69.0 in | Wt 167.2 lb

## 2017-07-11 DIAGNOSIS — I10 Essential (primary) hypertension: Secondary | ICD-10-CM | POA: Diagnosis not present

## 2017-07-11 DIAGNOSIS — R5383 Other fatigue: Secondary | ICD-10-CM

## 2017-07-11 DIAGNOSIS — Z0000 Encounter for general adult medical examination without abnormal findings: Secondary | ICD-10-CM | POA: Diagnosis not present

## 2017-07-11 DIAGNOSIS — Z136 Encounter for screening for cardiovascular disorders: Secondary | ICD-10-CM

## 2017-07-11 DIAGNOSIS — E291 Testicular hypofunction: Secondary | ICD-10-CM

## 2017-07-11 DIAGNOSIS — Z111 Encounter for screening for respiratory tuberculosis: Secondary | ICD-10-CM | POA: Diagnosis not present

## 2017-07-11 DIAGNOSIS — K219 Gastro-esophageal reflux disease without esophagitis: Secondary | ICD-10-CM

## 2017-07-11 DIAGNOSIS — Z0001 Encounter for general adult medical examination with abnormal findings: Secondary | ICD-10-CM

## 2017-07-11 DIAGNOSIS — E782 Mixed hyperlipidemia: Secondary | ICD-10-CM

## 2017-07-11 DIAGNOSIS — N401 Enlarged prostate with lower urinary tract symptoms: Secondary | ICD-10-CM

## 2017-07-11 DIAGNOSIS — R0989 Other specified symptoms and signs involving the circulatory and respiratory systems: Secondary | ICD-10-CM

## 2017-07-11 DIAGNOSIS — R7303 Prediabetes: Secondary | ICD-10-CM

## 2017-07-11 DIAGNOSIS — Z125 Encounter for screening for malignant neoplasm of prostate: Secondary | ICD-10-CM

## 2017-07-11 DIAGNOSIS — Z1212 Encounter for screening for malignant neoplasm of rectum: Secondary | ICD-10-CM

## 2017-07-11 DIAGNOSIS — E559 Vitamin D deficiency, unspecified: Secondary | ICD-10-CM

## 2017-07-11 DIAGNOSIS — Z79899 Other long term (current) drug therapy: Secondary | ICD-10-CM

## 2017-07-12 LAB — CBC WITH DIFFERENTIAL/PLATELET
Basophils Absolute: 11 cells/uL (ref 0–200)
Basophils Relative: 0.2 %
Eosinophils Absolute: 50 cells/uL (ref 15–500)
Eosinophils Relative: 0.9 %
HCT: 45.2 % (ref 38.5–50.0)
Hemoglobin: 15.5 g/dL (ref 13.2–17.1)
Lymphs Abs: 1075 cells/uL (ref 850–3900)
MCH: 32.3 pg (ref 27.0–33.0)
MCHC: 34.3 g/dL (ref 32.0–36.0)
MCV: 94.2 fL (ref 80.0–100.0)
MPV: 9.7 fL (ref 7.5–12.5)
Monocytes Relative: 13.9 %
Neutro Abs: 3685 cells/uL (ref 1500–7800)
Neutrophils Relative %: 65.8 %
Platelets: 175 10*3/uL (ref 140–400)
RBC: 4.8 10*6/uL (ref 4.20–5.80)
RDW: 12.3 % (ref 11.0–15.0)
Total Lymphocyte: 19.2 %
WBC mixed population: 778 cells/uL (ref 200–950)
WBC: 5.6 10*3/uL (ref 3.8–10.8)

## 2017-07-12 LAB — LIPID PANEL
Cholesterol: 142 mg/dL (ref <200)
HDL: 32 mg/dL — ABNORMAL LOW (ref >40)
LDL Cholesterol (Calc): 73 mg/dL (calc)
Non-HDL Cholesterol (Calc): 110 mg/dL (calc) (ref <130)
Total CHOL/HDL Ratio: 4.4 (calc) (ref <5.0)
Triglycerides: 306 mg/dL — ABNORMAL HIGH (ref <150)

## 2017-07-12 LAB — VITAMIN B12: Vitamin B-12: 368 pg/mL (ref 200–1100)

## 2017-07-12 LAB — MICROALBUMIN / CREATININE URINE RATIO
Creatinine, Urine: 60 mg/dL (ref 20–370)
Microalb, Ur: <0.2 mg/dL

## 2017-07-12 LAB — URINE CULTURE
MICRO NUMBER:: 80966524
Result:: NO GROWTH
SPECIMEN QUALITY:: ADEQUATE

## 2017-07-12 LAB — BASIC METABOLIC PANEL WITH GFR
BUN: 14 mg/dL (ref 7–25)
CO2: 27 mmol/L (ref 20–32)
Calcium: 9.3 mg/dL (ref 8.6–10.3)
Chloride: 101 mmol/L (ref 98–110)
Creat: 1.05 mg/dL (ref 0.70–1.33)
GFR, Est African American: 90 mL/min/{1.73_m2} (ref >=60)
GFR, Est Non African American: 78 mL/min/{1.73_m2} (ref >=60)
Glucose, Bld: 79 mg/dL (ref 65–99)
Potassium: 4.4 mmol/L (ref 3.5–5.3)
Sodium: 139 mmol/L (ref 135–146)

## 2017-07-12 LAB — HEMOGLOBIN A1C
Hgb A1c MFr Bld: 4.8 % of total Hgb (ref <5.7)
Mean Plasma Glucose: 91 (calc)
eAG (mmol/L): 5 (calc)

## 2017-07-12 LAB — HEPATIC FUNCTION PANEL
AG Ratio: 1.8 (calc) (ref 1.0–2.5)
ALT: 19 U/L (ref 9–46)
AST: 27 U/L (ref 10–35)
Albumin: 4.4 g/dL (ref 3.6–5.1)
Alkaline phosphatase (APISO): 43 U/L (ref 40–115)
Bilirubin, Direct: 0.1 mg/dL (ref 0.0–0.2)
Globulin: 2.4 g/dL (calc) (ref 1.9–3.7)
Indirect Bilirubin: 0.3 mg/dL (calc) (ref 0.2–1.2)
Total Bilirubin: 0.4 mg/dL (ref 0.2–1.2)
Total Protein: 6.8 g/dL (ref 6.1–8.1)

## 2017-07-12 LAB — EXTRA URINE SPECIMEN

## 2017-07-12 LAB — PSA: PSA: 1.3 ng/mL (ref <=4.0)

## 2017-07-12 LAB — IRON, TOTAL/TOTAL IRON BINDING CAP
%SAT: 26 % (calc) (ref 15–60)
Iron: 73 ug/dL (ref 50–180)
TIBC: 284 mcg/dL (calc) (ref 250–425)

## 2017-07-12 LAB — INSULIN, FASTING: Insulin: 3.9 u[IU]/mL (ref 2.0–19.6)

## 2017-07-12 LAB — TSH: TSH: 3.28 mIU/L (ref 0.40–4.50)

## 2017-07-12 LAB — TESTOSTERONE: Testosterone: 734 ng/dL (ref 250–827)

## 2017-07-12 LAB — VITAMIN D 25 HYDROXY (VIT D DEFICIENCY, FRACTURES): Vit D, 25-Hydroxy: 47 ng/mL (ref 30–100)

## 2017-07-12 LAB — MAGNESIUM: Magnesium: 2 mg/dL (ref 1.5–2.5)

## 2017-07-13 ENCOUNTER — Encounter: Payer: Self-pay | Admitting: Internal Medicine

## 2017-07-18 ENCOUNTER — Encounter: Payer: Self-pay | Admitting: Internal Medicine

## 2017-07-19 LAB — TB SKIN TEST
Induration: 0 mm
TB Skin Test: NEGATIVE

## 2017-07-21 ENCOUNTER — Ambulatory Visit (INDEPENDENT_AMBULATORY_CARE_PROVIDER_SITE_OTHER): Payer: 59 | Admitting: Physician Assistant

## 2017-07-21 ENCOUNTER — Encounter: Payer: Self-pay | Admitting: Physician Assistant

## 2017-07-21 VITALS — BP 122/74 | HR 60 | Ht 69.0 in | Wt 163.0 lb

## 2017-07-21 DIAGNOSIS — R143 Flatulence: Secondary | ICD-10-CM

## 2017-07-21 DIAGNOSIS — R1013 Epigastric pain: Secondary | ICD-10-CM | POA: Diagnosis not present

## 2017-07-21 DIAGNOSIS — Z8719 Personal history of other diseases of the digestive system: Secondary | ICD-10-CM | POA: Diagnosis not present

## 2017-07-21 DIAGNOSIS — J069 Acute upper respiratory infection, unspecified: Secondary | ICD-10-CM

## 2017-07-21 MED ORDER — OMEPRAZOLE 40 MG PO CPDR: 40.0000 mg | DELAYED_RELEASE_CAPSULE | Freq: Two times a day (BID) | ORAL | 2 refills | Status: DC

## 2017-07-21 MED ORDER — SUCRALFATE 1 G PO TABS: ORAL_TABLET | ORAL | 1 refills | Status: DC

## 2017-07-21 MED ORDER — RANITIDINE HCL 150 MG PO TABS: 150.0000 mg | ORAL_TABLET | Freq: Two times a day (BID) | ORAL | 2 refills | Status: DC

## 2017-07-21 NOTE — Progress Notes (Signed)
Subjective:    Patient ID: Jay Brooks, male    DOB: [birth year], 56 y.o.   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 Christella Hartigan him a last seen in our office in 2016. He comes in today with complaints of epigastric pain and gas. Patient has history of hypertension and GERD. He underwent colonoscopy in 2012 which was negative with the exception of mild diverticulosis, and had EGD in June 2014 showing mild nonspecific gastritis, no hiatal hernia. Biopsies from the stomach showed no evidence of H. pylori. Patient says he had done well for quite a while without any medication for reflux, he took Zantac once daily and then increased it to twice daily late last year. He says round the beginning of this year he switched to omeprazole 20 mg and now over the past several months has had gradual worsening of symptoms. He is actually not having reflux but rather dyspeptic symptoms with epigastric discomfort burping belching and gas. He says he also feels a lot of rumbling in his upper abdomen, and generally feels better with some food in his stomach. He has had a weight loss of about 10 pounds. No complaints of nausea or vomiting. He denies any heartburn and no dysphagia. No changes with his bowels melena or hematochezia. He is not on any regular aspirin or NSAIDs. Couple of weeks ago when he called the office he started back on omeprazole 40 mg by mouth twice a day and Zantac 150 mg by mouth twice a day without any change in his symptoms thus far. She mentions that both he and his wife have been feeling poorly just this week with upper abdominal pressure some cramping and urgency without diarrhea, no fever or chills. He has been using some Pepto-Bismol  Review of Systems Pertinent positive and negative review of systems were noted in the above HPI section.  All other review of systems was otherwise negative.  Outpatient Encounter Prescriptions as of 07/21/2017  Medication  Sig  . aspirin 81 MG tablet Take 81 mg by mouth daily.  Marland Kitchen atovaquone-proguanil (MALARONE) 250-100 MG TABS Take 1 tablet by mouth daily. As needed for work  . azelastine (ASTELIN) 0.1 % nasal spray USE 2 SPRAYS IN EACH NOSTRI TWICE A DAY AS DIRECTED  . calcium carbonate (TUMS - DOSED IN MG ELEMENTAL CALCIUM) 500 MG chewable tablet Chew 1 tablet by mouth daily.  . clindamycin (CLEOCIN T) 1 % lotion   . omeprazole (PRILOSEC) 40 MG capsule Take 1 capsule (40 mg total) by mouth 2 (two) times daily.  . Probiotic Product (PROBIOTIC DAILY PO) Take 1 capsule by mouth daily.  . ranitidine (ZANTAC) 300 MG tablet Take 1 tablet (300 mg total) by mouth at bedtime. (Patient taking differently: Take 300 mg by mouth 2 (two) times daily. )  . tamsulosin (FLOMAX) 0.4 MG CAPS capsule TAKE 1 CAPSULE (0.4 MG TOTAL) BY MOUTH DAILY AFTER SUPPER.  Marland Kitchen testosterone cypionate (DEPOTESTOSTERONE CYPIONATE) 200 MG/ML injection INJECT INTO THE MUSCLE EVERY 2 WEEKS  . triamcinolone (NASACORT ALLERGY 24HR) 55 MCG/ACT AERO nasal inhaler Place 2 sprays into the nose daily.  . VENTOLIN HFA 108 (90 Base) MCG/ACT inhaler   . [DISCONTINUED] omeprazole (PRILOSEC) 40 MG capsule Take 40 mg by mouth 2 (two) times daily.  . ranitidine (ZANTAC) 150 MG tablet Take 1 tablet (150 mg total) by mouth 2 (two) times daily.  . sucralfate (CARAFATE) 1 g tablet Take 1 gram tablet by  mouth between meals and at bedtime.   No facility-administered encounter medications on file as of 07/21/2017.    Allergies  Allergen Reactions  . Allegra [Fexofenadine]   . Doxycycline Nausea Only  . Levaquin [Levofloxacin] Nausea And Vomiting    Patient states that after he took levaquin this past time he had nausea with vomiting  . Other     Benzol Peroxide- caused blisters   Patient Active Problem List   Diagnosis Date Noted  . Prostatism 12/08/2015  . Prediabetes 12/01/2014  . Vitamin D deficiency 12/01/2014  . Hyperlipidemia 10/22/2014  . Medication  management 10/22/2014  . Labile hypertension 10/22/2014  . Testosterone Deficiency 08/20/2014  . Chronic cough 12/06/2012  . GERD (gastroesophageal reflux disease) 12/06/2012  . Chronic rhinitis 12/06/2012   Social History   Social History  . Marital status: Married    Spouse name: N/A  . Number of children: 1  . Years of education: N/A   Occupational History  . PILOT [employer]   Social History Main Topics  . Smoking status: Never Smoker  . Smokeless tobacco: Never Used  . Alcohol use Yes     Comment: occ  . Drug use: No  . Sexual activity: Not on file   Other Topics Concern  . Not on file   Social History Narrative   Daily caffeine     Mr. Nygaard family history includes Breast cancer in his maternal grandmother; Cancer (age of onset: 74) in his mother; Colon polyps in his brother and maternal uncle.      Objective:    Vitals:   07/21/17 0852  BP: 122/74  Pulse: 60    Physical Exam well-developed white male in no acute distress, pleasant blood pressure 122/74 pulse 60, BMI 24.0. HEENT; nontraumatic normocephalic EOMI PERRLA sclera anicteric, Cardiovascular ;regular rate and rhythm with S1-S2 no murmur or gallop, Pulmonary ;clear bilaterally, Abdomen ;soft, no significant upper abdominal tenderness, no palpable mass or hepatosplenomegaly no guarding or rebound bowel sounds are present, Rectal ;exam not done, Extremities; no clubbing cyanosis or edema skin warm and dry, Neuropsych; mood and affect appropriate       Assessment & Plan:   #44 56 year old white male with history of GERD now with several month history of progressive epigastric discomfort and dyspepsia with gas and belching and burping, thus far refractory to PPI therapy. His symptoms are not really consistent with GERD, rule out gastropathy, peptic ulcer disease, gastric lesion, underlying neoplasm and less likely gallbladder disease. #2 diverticulosis #3 hypertension  Plan; For  now he'll  continue omeprazole 40 mg by mouth twice a day and may take Zantac 150 as needed Add Carafate 1 g between meals and at bedtime Patient will be scheduled for upper endoscopy with Dr. Christella Hartigan. Procedure discussed in detail with patient including risks and benefits and he is agreeable to proceed. Schedule for upper abdominal ultrasound. If above workup negative he'll need CT imaging. Labs done on 07/11/2017 through his PCP reviewed and unremarkable.   Jadian Karman S Emerald Shor PA-C 07/21/2017   Cc: Lucky Cowboy, MD

## 2017-07-21 NOTE — Patient Instructions (Signed)
We sent refills for the Omeprazole 40 mg, take 1 tab twice daily . Also  We sent refills for Zantac.  We sent a new prescription for Carafate tablets, take between meals and at bedtime.  Try to eat a bland diet.   Please call us when you have your schedule for the Endoscopy with Dr. Ardis Hughs.

## 2017-07-23 NOTE — Progress Notes (Signed)
I agree with the above note, plan 

## 2017-07-31 ENCOUNTER — Ambulatory Visit (HOSPITAL_COMMUNITY)
Admission: RE | Admit: 2017-07-31 | Discharge: 2017-07-31 | Disposition: A | Discharge status: Home / Self Care | Payer: 59 | Source: Ambulatory Visit | Attending: Physician Assistant | Admitting: Physician Assistant

## 2017-07-31 DIAGNOSIS — R1013 Epigastric pain: Secondary | ICD-10-CM | POA: Insufficient documentation

## 2017-07-31 DIAGNOSIS — R143 Flatulence: Secondary | ICD-10-CM | POA: Diagnosis not present

## 2017-08-23 ENCOUNTER — Other Ambulatory Visit: Payer: Self-pay

## 2017-08-23 DIAGNOSIS — Z1212 Encounter for screening for malignant neoplasm of rectum: Secondary | ICD-10-CM

## 2017-08-23 LAB — POC HEMOCCULT BLD/STL (HOME/3-CARD/SCREEN)
Card #2 Fecal Occult Blod, POC: NEGATIVE
Card #3 Fecal Occult Blood, POC: NEGATIVE
Fecal Occult Blood, POC: NEGATIVE

## 2017-08-29 ENCOUNTER — Ambulatory Visit (INDEPENDENT_AMBULATORY_CARE_PROVIDER_SITE_OTHER): Payer: 59 | Admitting: *Deleted

## 2017-08-29 ENCOUNTER — Encounter: Payer: Self-pay | Admitting: Gastroenterology

## 2017-08-29 ENCOUNTER — Ambulatory Visit (AMBULATORY_SURGERY_CENTER): Payer: Self-pay | Admitting: *Deleted

## 2017-08-29 VITALS — Ht 69.0 in | Wt 162.8 lb

## 2017-08-29 DIAGNOSIS — E291 Testicular hypofunction: Secondary | ICD-10-CM

## 2017-08-29 DIAGNOSIS — R1013 Epigastric pain: Secondary | ICD-10-CM

## 2017-08-29 DIAGNOSIS — Z23 Encounter for immunization: Secondary | ICD-10-CM | POA: Diagnosis not present

## 2017-08-29 MED ORDER — TESTOSTERONE CYPIONATE 200 MG/ML IM SOLN
400.0000 mg | Freq: Once | INTRAMUSCULAR | Status: AC
  Administered 2017-08-29: 400 mg via INTRAMUSCULAR

## 2017-08-29 NOTE — Progress Notes (Signed)
Patient here for a NV for Testosterone Cypionate injection 200 mg/ml 2 ml IM right upper outer quadrant.  Patient tolerated well.  Patient also received his flu vaccine in his left deltoid.

## 2017-08-29 NOTE — Progress Notes (Signed)
No egg or soy allergy known to patient  No issues with past sedation with any surgeries  or procedures, no intubation problems  No diet pills per patient No home 02 use per patient  No blood thinners per patient  Pt denies issues with constipation  No A fib or A flutter  EMMI video sent to pt's e mail  Pt. declined 

## 2017-09-05 ENCOUNTER — Other Ambulatory Visit: Payer: Self-pay | Admitting: Gastroenterology

## 2017-09-08 ENCOUNTER — Encounter: Payer: Self-pay | Admitting: Gastroenterology

## 2017-09-08 ENCOUNTER — Ambulatory Visit (AMBULATORY_SURGERY_CENTER): Payer: 59 | Admitting: Gastroenterology

## 2017-09-08 VITALS — BP 106/69 | HR 65 | Temp 97.3°F | Resp 16 | Ht 69.0 in | Wt 162.0 lb

## 2017-09-08 DIAGNOSIS — R1013 Epigastric pain: Secondary | ICD-10-CM | POA: Diagnosis present

## 2017-09-08 DIAGNOSIS — K2950 Unspecified chronic gastritis without bleeding: Secondary | ICD-10-CM | POA: Diagnosis not present

## 2017-09-08 DIAGNOSIS — K2970 Gastritis, unspecified, without bleeding: Secondary | ICD-10-CM

## 2017-09-08 MED ORDER — SODIUM CHLORIDE 0.9 % IV SOLN: 500.0000 mL | INTRAVENOUS | Status: DC

## 2017-09-08 NOTE — Progress Notes (Signed)
Called to room to assist during endoscopic procedure.  Patient ID and intended procedure confirmed with present staff. Received instructions for my participation in the procedure from the performing physician.  

## 2017-09-08 NOTE — Op Note (Signed)
[place] Patient Name: Jay Brooks Procedure Date: 09/08/2017 8:35 AM MRN: [id number] Endoscopist: Milus Banister , MD Age: 56 Referring MD:  Date of Birth: 20-Apr-1961 Gender: Male Account #: [id number] Procedure:                Upper GI endoscopy Indications:              Dyspepsia Medicines:                Monitored Anesthesia Care Procedure:                Pre-Anesthesia Assessment:                           - Prior to the procedure, a History and Physical                            was performed, and patient medications and                            allergies were reviewed. The patient's tolerance of                            previous anesthesia was also reviewed. The risks                            and benefits of the procedure and the sedation                            options and risks were discussed with the patient.                            All questions were answered, and informed consent                            was obtained. Prior Anticoagulants: The patient has                            taken no previous anticoagulant or antiplatelet                            agents. ASA Grade Assessment: II - A patient with                            mild systemic disease. After reviewing the risks                            and benefits, the patient was deemed in                            satisfactory condition to undergo the procedure.                           After obtaining informed consent, the endoscope was  passed under direct vision. Throughout the                            procedure, the patient's blood pressure, pulse, and                            oxygen saturations were monitored continuously. The                            Endoscope was introduced through the mouth, and                            advanced to the second part of duodenum. The upper                            GI endoscopy was accomplished without  difficulty.                            The patient tolerated the procedure well. Scope In: Scope Out: Findings:                 The esophagus was normal.                           Mild inflammation characterized by erythema and                            granularity was found in the gastric antrum.                            Biopsies were taken with a cold forceps for                            histology.                           The examined duodenum was normal. Complications:            No immediate complications. Estimated blood loss:                            None. Estimated Blood Loss:     Estimated blood loss: none. Impression:               - Normal esophagus.                           - Mild, non-specific gastritis, biopsied.                           - Normal examined duodenum. Recommendation:           - Patient has a contact number available for                            emergencies. The signs and symptoms of potential  delayed complications were discussed with the                            patient. Return to normal activities tomorrow.                            Written discharge instructions were provided to the                            patient.                           - Resume previous diet.                           - Please decrease to once daily omeprazole, stay on                            twice daily zantac (ranitidine).                           - Await pathology results. If biopsies do not show                            H. pylori. will arrange CT scan abd/pelvis. Milus Banister, MD 09/08/2017 8:52:37 AM This report has been signed electronically.

## 2017-09-08 NOTE — Progress Notes (Signed)
A and O x3. Report to RN. Tolerated MAC anesthesia well.Teeth unchanged after procedure.

## 2017-09-08 NOTE — Patient Instructions (Signed)
YOU HAD AN ENDOSCOPIC PROCEDURE TODAY AT [facility]:   Refer to the procedure report that was given to you for any specific questions about what was found during the examination.  If the procedure report does not answer your questions, please call your gastroenterologist to clarify.  If you requested that your care partner not be given the details of your procedure findings, then the procedure report has been included in a sealed envelope for you to review at your convenience later.  YOU SHOULD EXPECT: Some feelings of bloating in the abdomen. Passage of more gas than usual.  Walking can help get rid of the air that was put into your GI tract during the procedure and reduce the bloating. If you had a lower endoscopy (such as a colonoscopy or flexible sigmoidoscopy) you may notice spotting of blood in your stool or on the toilet paper. If you underwent a bowel prep for your procedure, you may not have a normal bowel movement for a few days.  Please Note:  You might notice some irritation and congestion in your nose or some drainage.  This is from the oxygen used during your procedure.  There is no need for concern and it should clear up in a day or so.  SYMPTOMS TO REPORT IMMEDIATELY:   Following upper endoscopy (EGD)  Vomiting of blood or coffee ground material  New chest pain or pain under the shoulder blades  Painful or persistently difficult swallowing  New shortness of breath  Fever of 100F or higher  Black, tarry-looking stools  For urgent or emergent issues, a gastroenterologist can be reached at any hour by calling [phone].   DIET:  We do recommend a small meal at first, but then you may proceed to your regular diet.  Drink plenty of fluids but you should avoid alcoholic beverages for 24 hours.  ACTIVITY:  You should plan to take it easy for the rest of today and you should NOT DRIVE or use heavy machinery until tomorrow (because of the sedation medicines used  during the test).    FOLLOW UP: Our staff will call the number listed on your records the next business day following your procedure to check on you and address any questions or concerns that you may have regarding the information given to you following your procedure. If we do not reach you, we will leave a message.  However, if you are feeling well and you are not experiencing any problems, there is no need to return our call.  We will assume that you have returned to your regular daily activities without incident.  If any biopsies were taken you will be contacted by phone or by letter within the next 1-3 weeks.  Please call us at [phone] if you have not heard about the biopsies in 3 weeks.   Decrease Omeprazole to once daily take in the morning. Stay on Zantac (ranitidine) twice a day. Await for biopsy results Gastritis (handout given)  SIGNATURES/CONFIDENTIALITY: You and/or your care partner have signed paperwork which will be entered into your electronic medical record.  These signatures attest to the fact that that the information above on your After Visit Summary has been reviewed and is understood.  Full responsibility of the confidentiality of this discharge information lies with you and/or your care-partner.

## 2017-09-11 ENCOUNTER — Encounter: Payer: Self-pay | Admitting: Physician Assistant

## 2017-09-11 ENCOUNTER — Telehealth: Payer: Self-pay

## 2017-09-11 ENCOUNTER — Ambulatory Visit (INDEPENDENT_AMBULATORY_CARE_PROVIDER_SITE_OTHER): Payer: 59 | Admitting: Physician Assistant

## 2017-09-11 ENCOUNTER — Telehealth: Payer: Self-pay | Admitting: Gastroenterology

## 2017-09-11 VITALS — BP 120/88 | HR 97 | Temp 97.7°F | Resp 16 | Ht 69.0 in | Wt 167.0 lb

## 2017-09-11 DIAGNOSIS — R1032 Left lower quadrant pain: Secondary | ICD-10-CM

## 2017-09-11 DIAGNOSIS — R350 Frequency of micturition: Secondary | ICD-10-CM | POA: Diagnosis not present

## 2017-09-11 DIAGNOSIS — R112 Nausea with vomiting, unspecified: Secondary | ICD-10-CM | POA: Diagnosis not present

## 2017-09-11 NOTE — Telephone Encounter (Signed)
Patty, can you get in touch with him.  He's having abd pain, chills, vomiting since yesterday.  I don't think I it is related to his EGD last week but more likely the underlying issue that necessitated the EGD.  He needs labs (cbc, cmet) today and CT scan abd/pelvis (this week).  Thanks

## 2017-09-11 NOTE — Patient Instructions (Signed)
Call if anything changes or message   Abdominal Pain, Adult Abdominal pain can be caused by many things. Often, abdominal pain is not serious and it gets better with no treatment or by being treated at home. However, sometimes abdominal pain is serious. Your health care provider will do a medical history and a physical exam to try to determine the cause of your abdominal pain. Follow these instructions at home:  Take over-the-counter and prescription medicines only as told by your health care provider. Do not take a laxative unless told by your health care provider.  Drink enough fluid to keep your urine clear or pale yellow.  Watch your condition for any changes.  Keep all follow-up visits as told by your health care provider. This is important. Contact a health care provider if:  Your abdominal pain changes or gets worse.  You are not hungry or you lose weight without trying.  You are constipated or have diarrhea for more than 2-3 days.  You have pain when you urinate or have a bowel movement.  Your abdominal pain wakes you up at night.  Your pain gets worse with meals, after eating, or with certain foods.  You are throwing up and cannot keep anything down.  You have a fever. Get help right away if:  Your pain does not go away as soon as your health care provider told you to expect.  You cannot stop throwing up.  Your pain is only in areas of the abdomen, such as the right side or the left lower portion of the abdomen.  You have bloody or black stools, or stools that look like tar.  You have severe pain, cramping, or bloating in your abdomen.  You have signs of dehydration, such as: ? Dark urine, very little urine, or no urine. ? Cracked lips. ? Dry mouth. ? Sunken eyes. ? Sleepiness. ? Weakness. This information is not intended to replace advice given to you by your health care provider. Make sure you discuss any questions you have with your health care  provider. Document Released: 08/03/2005 Document Revised: 05/13/2016 Document Reviewed: 04/06/2016 Elsevier Interactive Patient Education  2017 [place] Choices to Help Relieve Diarrhea, Adult When you have diarrhea, the foods you eat and your eating habits are very important. Choosing the right foods and drinks can help:  Relieve diarrhea.  Replace lost fluids and nutrients.  Prevent dehydration.  What general guidelines should I follow? Relieving diarrhea  Choose foods with less than 2 g or .07 oz. of fiber per serving.  Limit fats to less than 8 tsp (38 g or 1.34 oz.) a day.  Avoid the following: ? Foods and beverages sweetened with high-fructose corn syrup, honey, or sugar alcohols such as xylitol, sorbitol, and mannitol. ? Foods that contain a lot of fat or sugar. ? Fried, greasy, or spicy foods. ? High-fiber grains, breads, and cereals. ? Raw fruits and vegetables.  Eat foods that are rich in probiotics. These foods include dairy products such as yogurt and fermented milk products. They help increase healthy bacteria in the stomach and intestines (gastrointestinal tract, or GI tract).  If you have lactose intolerance, avoid dairy products. These may make your diarrhea worse.  Take medicine to help stop diarrhea (antidiarrheal medicine) only as told by your health care provider. Replacing nutrients  Eat small meals or snacks every 3-4 hours.  Eat bland foods, such as white rice, toast, or baked potato, until your diarrhea starts to get  better. Gradually reintroduce nutrient-rich foods as tolerated or as told by your health care provider. This includes: ? Well-cooked protein foods. ? Peeled, seeded, and soft-cooked fruits and vegetables. ? Low-fat dairy products.  Take vitamin and mineral supplements as told by your health care provider. Preventing dehydration   Start by sipping water or a special solution to prevent dehydration (oral rehydration  solution, ORS). Urine that is clear or pale yellow means that you are getting enough fluid.  Try to drink at least 8-10 cups of fluid each day to help replace lost fluids.  You may add other liquids in addition to water, such as clear juice or decaffeinated sports drinks, as tolerated or as told by your health care provider.  Avoid drinks with caffeine, such as coffee, tea, or soft drinks.  Avoid alcohol. What foods are recommended? The items listed may not be a complete list. Talk with your health care provider about what dietary choices are best for you. Grains White rice. White, [place], or pita breads (fresh or toasted), including plain rolls, buns, or bagels. White pasta. Saltine, soda, or graham crackers. Pretzels. Low-fiber cereal. Cooked cereals made with water (such as cornmeal, farina, or cream cereals). Plain muffins. Matzo. Melba toast. Zwieback. Vegetables Potatoes (without the skin). Most well-cooked and canned vegetables without skins or seeds. Tender lettuce. Fruits Apple sauce. Fruits canned in juice. Cooked apricots, cherries, grapefruit, peaches, pears, or plums. Fresh bananas and cantaloupe. Meats and other protein foods Baked or boiled chicken. Eggs. Tofu. Fish. Seafood. Smooth nut butters. Ground or well-cooked tender beef, ham, veal, lamb, pork, or poultry. Dairy Plain yogurt, kefir, and unsweetened liquid yogurt. Lactose-free milk, buttermilk, skim milk, or soy milk. Low-fat or nonfat hard cheese. Beverages Water. Low-calorie sports drinks. Fruit juices without pulp. Strained tomato and vegetable juices. Decaffeinated teas. Sugar-free beverages not sweetened with sugar alcohols. Oral rehydration solutions, if approved by your health care provider. Seasoning and other foods Bouillon, broth, or soups made from recommended foods. What foods are not recommended? The items listed may not be a complete list. Talk with your health care provider about what dietary choices are  best for you. Grains Whole grain, whole wheat, bran, or rye breads, rolls, pastas, and crackers. Wild or brown rice. Whole grain or bran cereals. Barley. Oats and oatmeal. Corn tortillas or taco shells. Granola. Popcorn. Vegetables Raw vegetables. Fried vegetables. Cabbage, broccoli, Brussels sprouts, artichokes, baked beans, beet greens, corn, kale, legumes, peas, sweet potatoes, and yams. Potato skins. Cooked spinach and cabbage. Fruits Dried fruit, including raisins and dates. Raw fruits. Stewed or dried prunes. Canned fruits with syrup. Meat and other protein foods Fried or fatty meats. Deli meats. Chunky nut butters. Nuts and seeds. Beans and lentils. Berniece Salines. Hot dogs. Sausage. Dairy High-fat cheeses. Whole milk, chocolate milk, and beverages made with milk, such as milk shakes. Half-and-half. Cream. sour cream. Ice cream. Beverages Caffeinated beverages (such as coffee, tea, soda, or energy drinks). Alcoholic beverages. Fruit juices with pulp. Prune juice. Soft drinks sweetened with high-fructose corn syrup or sugar alcohols. High-calorie sports drinks. Fats and oils Butter. Cream sauces. Margarine. Salad oils. Plain salad dressings. Olives. Avocados. Mayonnaise. Sweets and desserts Sweet rolls, doughnuts, and sweet breads. Sugar-free desserts sweetened with sugar alcohols such as xylitol and sorbitol. Seasoning and other foods Honey. Hot sauce. Chili powder. Gravy. Cream-based or milk-based soups. Pancakes and waffles. Summary  When you have diarrhea, the foods you eat and your eating habits are very important.  Make sure you get at  least 8-10 cups of fluid each day, or enough to keep your urine clear or pale yellow.  Eat bland foods and gradually reintroduce healthy, nutrient-rich foods as tolerated, or as told by your health care provider.  Avoid high-fiber, fried, greasy, or spicy foods. This information is not intended to replace advice given to you by your health care provider.  Make sure you discuss any questions you have with your health care provider. Document Released: 01/14/2004 Document Revised: 10/21/2016 Document Reviewed: 10/21/2016 Elsevier Interactive Patient Education  2017 [employer].

## 2017-09-11 NOTE — Telephone Encounter (Signed)
Dr Ardis Hughs the PCP has ordered the labs and the CT.  The pt will make sure we have copies when completed

## 2017-09-11 NOTE — Telephone Encounter (Signed)
  Follow up Call-  Call back number 09/08/2017  Post procedure Call Back phone  # [phone]  Permission to leave phone message Yes  Some recent data might be hidden     Patient questions:  Do you have a fever, pain , or abdominal swelling? Yes.   Pain Score  1 *  Have you tolerated food without any problems? No.  Have you been able to return to your normal activities? No.  Do you have any questions about your discharge instructions: Diet   No. Medications  No. Follow up visit  No.  Do you have questions or concerns about your Care? Yes.    Actions: * If pain score is 4 or above: Physician/ provider Notified : Owens Loffler, MD . Notified Dr. Ardis Hughs pt started having vomiting, chills, lower back and abdomen pain that begin on Sunday.

## 2017-09-11 NOTE — Progress Notes (Signed)
Subjective:    Patient ID: Jay Brooks, male    DOB: September 02, 1961, 56 y.o.   MRN: [id number]  HPI 56 y.o. WM presents with body aches, lower AB discomfort x yesterday.  Had EGD Friday + gastritis, pending Hpylori, since then has had burping, beltching, AB discomfort. This AM started to have nausea, vomiting x 1 this AM, lower AB pressure, with body aches. No fever, chills. No blood in vomit or stool. Normal BM yesterday. He called Dr. Ardis Hughs, wants CBC, CMET, CT scan this week.  He has sweet smelling urine, has some dribbling, hesitancy, pain lower back, some frequency, no urgency, no dysuria.   Blood pressure 120/88, pulse 97, temperature 97.7 F (36.5 C), resp. rate 16, height 5\' 9"  (1.753 m), weight 167 lb (75.8 kg), SpO2 99 %.  Medications Current Outpatient Medications on File Prior to Visit  Medication Sig  . aspirin 81 MG tablet Take 81 mg by mouth daily.  Marland Kitchen atovaquone-proguanil (MALARONE) 250-100 MG TABS Take 1 tablet by mouth daily. As needed for work  . azelastine (ASTELIN) 0.1 % nasal spray USE 2 SPRAYS IN EACH NOSTRI TWICE A DAY AS DIRECTED  . calcium carbonate (TUMS - DOSED IN MG ELEMENTAL CALCIUM) 500 MG chewable tablet Chew 1 tablet by mouth daily.  . clindamycin (CLEOCIN T) 1 % lotion   . omeprazole (PRILOSEC) 40 MG capsule TAKE 1 CAPSULE BY MOUTH TWICE DAILY FOR 1 MONTH THEN TAKE 1 CAPSULE BY MOUTH EVERY DAY  . Probiotic Product (PROBIOTIC DAILY PO) Take 1 capsule by mouth daily.  . ranitidine (ZANTAC) 150 MG tablet Take 1 tablet (150 mg total) by mouth 2 (two) times daily.  Marland Kitchen testosterone cypionate (DEPOTESTOSTERONE CYPIONATE) 200 MG/ML injection INJECT 2ML INTO THE MUSCLE EVERY 2 WEEKS  . triamcinolone (NASACORT ALLERGY 24HR) 55 MCG/ACT AERO nasal inhaler Place 2 sprays into the nose daily.  . VENTOLIN HFA 108 (90 Base) MCG/ACT inhaler    No current facility-administered medications on file prior to visit.     Problem list He has Chronic cough; GERD  (gastroesophageal reflux disease); Chronic rhinitis; Testosterone Deficiency; Hyperlipidemia; Medication management; Labile hypertension; Prediabetes; Vitamin D deficiency; and Prostatism on their problem list.  Review of Systems  Constitutional: Positive for appetite change (decreased) and fatigue. Negative for activity change, chills, diaphoresis, fever and unexpected weight change.  HENT: Negative.   Respiratory: Negative.  Negative for cough and shortness of breath.   Cardiovascular: Negative.  Negative for chest pain, palpitations and leg swelling.  Gastrointestinal: Positive for abdominal pain, nausea and vomiting. Negative for abdominal distention, anal bleeding, blood in stool, constipation, diarrhea and rectal pain.  Genitourinary: Positive for difficulty urinating and frequency. Negative for decreased urine volume, discharge, dysuria, enuresis, flank pain, genital sores, hematuria, penile pain, penile swelling, scrotal swelling, testicular pain and urgency.  Musculoskeletal: Positive for myalgias.  Neurological: Negative.   Psychiatric/Behavioral: Negative.        Objective:   Physical Exam  Constitutional: He is oriented to person, place, and time. He appears well-developed and well-nourished.  HENT:  Head: Normocephalic and atraumatic.  Neck: Normal range of motion. Neck supple.  Cardiovascular: Normal rate and regular rhythm.  Pulmonary/Chest: Effort normal and breath sounds normal.  Abdominal: Soft. Bowel sounds are normal. He exhibits no distension and no mass. There is tenderness in the left lower quadrant. There is no rigidity, no rebound, no guarding, no CVA tenderness, no tenderness at McBurney's point and negative Murphy's sign.  Musculoskeletal: Normal range of motion.  Neurological: He is alert and oriented to person, place, and time.  Skin: Skin is warm and dry.       Assessment & Plan:   AB pain, nausea, vomiting, some LLQ, no rebound or red flags Some urinary  symptoms as well No fever, chills, no diarrhea, constipation Will get CBC, BMP, liver, urine Will set up CT AB/Pelvis Will not start on ABX at this time, will hold until labs, discussed with patient  The patient was advised to call immediately if he has any concerning symptoms in the interval. The patient voices understanding of current treatment options and is in agreement with the current care plan.The patient knows to call the clinic with any problems, questions or concerns or go to the ER if any further progression of symptoms.

## 2017-09-12 ENCOUNTER — Other Ambulatory Visit: Payer: Self-pay | Admitting: Physician Assistant

## 2017-09-12 DIAGNOSIS — R1032 Left lower quadrant pain: Secondary | ICD-10-CM

## 2017-09-12 LAB — BASIC METABOLIC PANEL WITH GFR
BUN: 11 mg/dL (ref 7–25)
CO2: 27 mmol/L (ref 20–32)
Calcium: 9.1 mg/dL (ref 8.6–10.3)
Chloride: 100 mmol/L (ref 98–110)
Creat: 0.99 mg/dL (ref 0.70–1.33)
GFR, Est African American: 97 mL/min/{1.73_m2} (ref >=60)
GFR, Est Non African American: 84 mL/min/{1.73_m2} (ref >=60)
Glucose, Bld: 94 mg/dL (ref 65–99)
Potassium: 3.9 mmol/L (ref 3.5–5.3)
Sodium: 137 mmol/L (ref 135–146)

## 2017-09-12 LAB — URINALYSIS, ROUTINE W REFLEX MICROSCOPIC
Bilirubin Urine: NEGATIVE
Glucose, UA: NEGATIVE
Hgb urine dipstick: NEGATIVE
Ketones, ur: NEGATIVE
Leukocytes, UA: NEGATIVE
Nitrite: NEGATIVE
Protein, ur: NEGATIVE
Specific Gravity, Urine: 1.019 (ref 1.001–1.03)
pH: 7 (ref 5.0–8.0)

## 2017-09-12 LAB — CBC WITH DIFFERENTIAL/PLATELET
Basophils Absolute: 36 cells/uL (ref 0–200)
Basophils Relative: 0.3 %
Eosinophils Absolute: 24 cells/uL (ref 15–500)
Eosinophils Relative: 0.2 %
HCT: 42.3 % (ref 38.5–50.0)
Hemoglobin: 15.1 g/dL (ref 13.2–17.1)
Lymphs Abs: 1092 cells/uL (ref 850–3900)
MCH: 32.7 pg (ref 27.0–33.0)
MCHC: 35.7 g/dL (ref 32.0–36.0)
MCV: 91.6 fL (ref 80.0–100.0)
MPV: 9.5 fL (ref 7.5–12.5)
Monocytes Relative: 14.3 %
Neutro Abs: 9132 cells/uL — ABNORMAL HIGH (ref 1500–7800)
Neutrophils Relative %: 76.1 %
Platelets: 161 10*3/uL (ref 140–400)
RBC: 4.62 10*6/uL (ref 4.20–5.80)
RDW: 12.4 % (ref 11.0–15.0)
Total Lymphocyte: 9.1 %
WBC mixed population: 1716 cells/uL — ABNORMAL HIGH (ref 200–950)
WBC: 12 10*3/uL — ABNORMAL HIGH (ref 3.8–10.8)

## 2017-09-12 LAB — HEPATIC FUNCTION PANEL
AG Ratio: 1.6 (calc) (ref 1.0–2.5)
ALT: 19 U/L (ref 9–46)
AST: 19 U/L (ref 10–35)
Albumin: 4.4 g/dL (ref 3.6–5.1)
Alkaline phosphatase (APISO): 48 U/L (ref 40–115)
Bilirubin, Direct: 0.2 mg/dL (ref 0.0–0.2)
Globulin: 2.8 g/dL (calc) (ref 1.9–3.7)
Indirect Bilirubin: 0.7 mg/dL (calc) (ref 0.2–1.2)
Total Bilirubin: 0.9 mg/dL (ref 0.2–1.2)
Total Protein: 7.2 g/dL (ref 6.1–8.1)

## 2017-09-12 MED ORDER — METRONIDAZOLE 500 MG PO TABS: 500.0000 mg | ORAL_TABLET | Freq: Three times a day (TID) | ORAL | 0 refills | Status: AC

## 2017-09-12 MED ORDER — CIPROFLOXACIN HCL 500 MG PO TABS: 500.0000 mg | ORAL_TABLET | Freq: Two times a day (BID) | ORAL | 0 refills | Status: AC

## 2017-09-13 LAB — URINE CULTURE
MICRO NUMBER:: 81240718
SPECIMEN QUALITY:: ADEQUATE

## 2017-09-14 ENCOUNTER — Ambulatory Visit
Admission: RE | Admit: 2017-09-14 | Discharge: 2017-09-14 | Disposition: A | Discharge status: Home / Self Care | Payer: 59 | Source: Ambulatory Visit | Attending: Physician Assistant | Admitting: Physician Assistant

## 2017-09-14 ENCOUNTER — Encounter: Payer: Self-pay | Admitting: Physician Assistant

## 2017-09-14 DIAGNOSIS — R1032 Left lower quadrant pain: Secondary | ICD-10-CM

## 2017-09-14 DIAGNOSIS — K5730 Diverticulosis of large intestine without perforation or abscess without bleeding: Secondary | ICD-10-CM | POA: Diagnosis not present

## 2017-09-14 MED ORDER — IOPAMIDOL (ISOVUE-300) INJECTION 61%
100.0000 mL | Freq: Once | INTRAVENOUS | Status: AC | PRN
  Administered 2017-09-14: 100 mL via INTRAVENOUS

## 2017-09-15 ENCOUNTER — Encounter: Payer: Self-pay | Admitting: Physician Assistant

## 2017-09-15 ENCOUNTER — Telehealth: Payer: Self-pay

## 2017-09-15 ENCOUNTER — Encounter: Payer: Self-pay | Admitting: Gastroenterology

## 2017-09-15 NOTE — Telephone Encounter (Signed)
10/06/17 830 am with Amy pt aware

## 2017-09-15 NOTE — Telephone Encounter (Signed)
-----   Message from Milus Banister, MD sent at 09/15/2017  8:20 AM EST ----- Chong Sicilian, He needs rov with myself or extender in the next 2-4 weeks for diverticulitis follow up.  Thanks  dj   ----- Message ----- From: Vicie Mutters, PA-C Sent: 09/14/2017   5:43 PM To: Milus Banister, MD  [facility] patient, he is finishing cipro/flagyl. Can see him back here or in your office to assure resolution.  Estill Bamberg

## 2017-09-20 DIAGNOSIS — S93511A Sprain of interphalangeal joint of right great toe, initial encounter: Secondary | ICD-10-CM | POA: Diagnosis not present

## 2017-09-21 ENCOUNTER — Encounter: Payer: Self-pay | Admitting: Internal Medicine

## 2017-09-21 ENCOUNTER — Ambulatory Visit (INDEPENDENT_AMBULATORY_CARE_PROVIDER_SITE_OTHER): Payer: 59 | Admitting: Internal Medicine

## 2017-09-21 VITALS — BP 122/72 | HR 69 | Temp 97.5°F | Ht 69.0 in | Wt 163.0 lb

## 2017-09-21 DIAGNOSIS — J329 Chronic sinusitis, unspecified: Secondary | ICD-10-CM

## 2017-09-21 DIAGNOSIS — J029 Acute pharyngitis, unspecified: Secondary | ICD-10-CM | POA: Diagnosis not present

## 2017-09-21 DIAGNOSIS — Z1211 Encounter for screening for malignant neoplasm of colon: Secondary | ICD-10-CM

## 2017-09-21 MED ORDER — PREDNISONE 20 MG PO TABS: ORAL_TABLET | ORAL | 0 refills | Status: DC

## 2017-09-21 MED ORDER — AZITHROMYCIN 250 MG PO TABS: ORAL_TABLET | ORAL | 1 refills | Status: DC

## 2017-09-21 NOTE — Patient Instructions (Signed)

## 2017-09-21 NOTE — Progress Notes (Signed)
  Subjective:    Patient ID: Jay Brooks, male    DOB: [birth year], 56 y.o.   MRN: [id number]  HPI  Patient is a nice 56 yo MWM with hx/o seasonal allergies and recurrent sinusitis. He is an Financial planner. Today he presents with c/o fullness in his ears, sinus pressur , runny nose , sore throat an sl productive cough. Denies putrid nasal secretions or sputum, fevers, chills or sweats.    Medication Sig  . aspirin 81 MG tablet Take 81 mg by mouth daily.  Marland Kitchen atovaquone-proguanil MALARONE 250-100 MG  Take 1 tablet by mouth daily. As needed for work  . ASTELIN  nasal spray USE 2 SPRAYS IN EACH NOSTRI TWICE A DAY AS DIRECTED  . TUMS Chew 1 tablet by mouth daily.  Marland Kitchen CLEOCIN T 1 % lotion   . omeprazole  40 MG capsule TAKE 1 CAPSULE BY MOUTH TWICE DAILY FOR 1 MONTH THEN TAKE 1 CAPSULE BY MOUTH EVERY DAY  . Probiotic  Take 1 capsule by mouth daily.  . ranitidine  150 MG tablet Take 1 tablet (150 mg total) by mouth 2 (two) times daily.  Marland Kitchen testosterone cypio 200 MG INJECT 2ML INTO THE MUSCLE EVERY 2 WEEKS  . NASACORT l inhaler Place 2 sprays into the nose daily.  Enid Cutter HFA   inhaler    Allergies  Allergen Reactions  . Allegra [Fexofenadine]   . Doxycycline Nausea Only  . Levaquin [Levofloxacin] Nausea And Vomiting    Patient states that after he took levaquin this past time he had nausea with vomiting  . Other     Benzol Peroxide- caused blisters   Past Medical History:  Diagnosis Date  . Allergy   . Chronic sinusitis   . Diverticulosis of colon (without mention of hemorrhage)   . GERD (gastroesophageal reflux disease)   . Lyme disease   . Sleep deficient    Review of Systems  10 point systems review negative except as above.    Objective:   Physical Exam  BP 122/72   Pulse 69   Temp (!) 97.5 F (36.4 C)   Ht 5\' 9"  (1.753 m)   Wt 163 lb (73.9 kg)   SpO2 96%   BMI 24.07 kg/m   No Distress. No Stridor. No rash, icterus.  HEENT - Tm's sl  retracted/Nl color. Sl fronto max tenderness. O/P - 2(+) injected. Neck - supple.  Chest - Clear equal BS. Cor - Nl HS. RRR w/o sig MGR. PP 1(+). No edema. MS- FROM w/o deformities.  Gait Nl. Neuro -  Nl w/o focal abnormalities.    Assessment & Plan:   1. Pharyngitis   2. Sinusitis  - predniSONE  20 MG tablet; 1 tab 3 x day for 3 days, then 1 tab 2 x day for 3 days, then 1 tab 1 x day for 5 days  Disp: 20 tablet;   - azithromycin  250 MG tablet; Take 2 tablets (500 mg) on  Day 1,  followed by 1 tablet (250 mg) once daily on Days 2 through 5.  Disp: 6 each; Refill: 1

## 2017-10-02 ENCOUNTER — Other Ambulatory Visit: Payer: Self-pay | Admitting: Gastroenterology

## 2017-10-04 NOTE — Addendum Note (Signed)
Addended by: Chancy Hurter on: 10/04/2017 04:04 PM   Modules accepted: Orders

## 2017-10-05 DIAGNOSIS — Z1212 Encounter for screening for malignant neoplasm of rectum: Secondary | ICD-10-CM | POA: Diagnosis not present

## 2017-10-06 ENCOUNTER — Encounter: Payer: Self-pay | Admitting: Physician Assistant

## 2017-10-06 ENCOUNTER — Ambulatory Visit (INDEPENDENT_AMBULATORY_CARE_PROVIDER_SITE_OTHER): Payer: 59 | Admitting: Physician Assistant

## 2017-10-06 VITALS — BP 110/70 | HR 64 | Ht 69.0 in | Wt 163.0 lb

## 2017-10-06 DIAGNOSIS — K219 Gastro-esophageal reflux disease without esophagitis: Secondary | ICD-10-CM | POA: Diagnosis not present

## 2017-10-06 DIAGNOSIS — K2950 Unspecified chronic gastritis without bleeding: Secondary | ICD-10-CM

## 2017-10-06 DIAGNOSIS — K5732 Diverticulitis of large intestine without perforation or abscess without bleeding: Secondary | ICD-10-CM | POA: Diagnosis not present

## 2017-10-06 NOTE — Progress Notes (Signed)
I agree with the above note, plan 

## 2017-10-06 NOTE — Patient Instructions (Signed)
Try to wean to once daily, Prilosec. You may take zantac 300 mg.   Push hydration. Add Benefiber Daily in 8 oz of water.   Follow up as needed with Dr. Owens Loffler or Amy Oneida Castle PA-C.

## 2017-10-06 NOTE — Progress Notes (Signed)
Subjective:    Patient ID: Jay Brooks, male    DOB: 1961-05-28, 56 y.o.   MRN: [id number]  HPI Jay Brooks comes in today for follow-up after recent diagnosis of diverticulitis.  He is known to Dr. Christella Brooks. His last colonoscopy was in 2012, with finding of mild sigmoid diverticulosis and otherwise negative exam.  He also has history of hypertension and GERD.  He was seen in the office in September 2018 at that time complaining of epigastric pain and gas.  He was treated empirically with Carafate and as needed ranitidine in addition to his omeprazole.  He continued to have some symptoms and eventually underwent EGD with Dr. Christella Brooks on 09/08/2017 which showed mild gastritis, biopsies were consistent with chronic active gastritis, there is no evidence of H. pylori.  Time he was seen for EGD he was still complaining of abdominal gas and discomfort and was scheduled for CT of the abdomen and pelvis which was done 09/14/2017.  This showed mild diverticulitis at the rectosigmoid junction in the left posterior pelvis there is no abscess, also noted prior left hernia repair.  He was started on a course of Cipro and Flagyl times 10 days which she has since completed. He says he is feeling better and is not having any lower abdominal discomfort.  He does have some occasional hard stools and constipation. He has been trying to wean back on his omeprazole use and says thus far when he tries to stop the second dose he will have increased evening symptoms.  He does have a prescription for ranitidine 300 mg p.o. nightly which she may substitute for omeprazole.  Not having a lot of heartburn but does get some belching and burping at times. 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  He says he does not eat regularly at times and does not drink a lot of fluids when he is flying.  Review of Systems Pertinent positive and negative review of systems were noted in the above  HPI section.  All other review of systems was otherwise negative.  Outpatient Encounter Medications as of 10/06/2017  Medication Sig  . aspirin 81 MG tablet Take 81 mg by mouth daily.  Marland Kitchen atovaquone-proguanil (MALARONE) 250-100 MG TABS Take 1 tablet by mouth daily. As needed for work  . azelastine (ASTELIN) 0.1 % nasal spray USE 2 SPRAYS IN EACH NOSTRI TWICE A DAY AS DIRECTED  . calcium carbonate (TUMS - DOSED IN MG ELEMENTAL CALCIUM) 500 MG chewable tablet Chew 1 tablet by mouth daily.  . clindamycin (CLEOCIN T) 1 % lotion   . omeprazole (PRILOSEC) 40 MG capsule TAKE 1 CAPSULE BY MOUTH TWICE DAILY FOR 1 MONTH THEN TAKE 1 CAPSULE BY MOUTH EVERY DAY  . Probiotic Product (PROBIOTIC DAILY PO) Take 1 capsule by mouth daily.  . ranitidine (ZANTAC) 150 MG tablet Take 1 tablet (150 mg total) by mouth 2 (two) times daily.  Marland Kitchen testosterone cypionate (DEPOTESTOSTERONE CYPIONATE) 200 MG/ML injection INJECT INTO THE MUSCLE EVERY 2 WEEKS  . triamcinolone (NASACORT ALLERGY 24HR) 55 MCG/ACT AERO nasal inhaler Place 2 sprays into the nose daily.  . VENTOLIN HFA 108 (90 Base) MCG/ACT inhaler   . [DISCONTINUED] azithromycin (ZITHROMAX) 250 MG tablet Take 2 tablets (500 mg) on  Day 1,  followed by 1 tablet (250 mg) once daily on Days 2 through 5.  . [DISCONTINUED] predniSONE (DELTASONE) 20 MG tablet 1 tab 3 x day  for 3 days, then 1 tab 2 x day for 3 days, then 1 tab 1 x day for 5 days   No facility-administered encounter medications on file as of 10/06/2017.    Allergies  Allergen Reactions  . Allegra [Fexofenadine]   . Doxycycline Nausea Only  . Levaquin [Levofloxacin] Nausea And Vomiting    Patient states that after he took levaquin this past time he had nausea with vomiting  . Other     Benzol Peroxide- caused blisters   Patient Active Problem List   Diagnosis Date Noted  . Prostatism 12/08/2015  . Prediabetes 12/01/2014  . Vitamin D deficiency 12/01/2014  . Hyperlipidemia 10/22/2014  .  Medication management 10/22/2014  . Labile hypertension 10/22/2014  . Testosterone Deficiency 08/20/2014  . Chronic cough 12/06/2012  . GERD (gastroesophageal reflux disease) 12/06/2012  . Chronic rhinitis 12/06/2012   Social History   Socioeconomic History  . Marital status: Married    Spouse name: Not on file  . Number of children: 1  . Years of education: Not on file  . Highest education level: Not on file  Social Needs  . Financial resource strain: Not on file  . Food insecurity - worry: Not on file  . Food insecurity - inability: Not on file  . Transportation needs - medical: Not on file  . Transportation needs - non-medical: Not on file  Occupational History  . Occupation: PILOT    Employer: [employer]  Tobacco Use  . Smoking status: Never Smoker  . Smokeless tobacco: Never Used  Substance and Sexual Activity  . Alcohol use: Yes    Comment: occ  . Drug use: No  . Sexual activity: Not on file  Other Topics Concern  . Not on file  Social History Narrative   Daily caffeine     [name] family history includes Breast cancer in his maternal grandmother; Cancer (age of onset: 23) in his mother; Colon polyps in his brother and maternal uncle.      Objective:    Vitals:   10/06/17 0835  BP: 110/70  Pulse: 64    Physical Exam Well-developed white male in no acute distress, pleasant blood pressure 110/70 pulse 64, BMI 24.0.  HEENT ;nontraumatic normocephalic EOMI PERRLA sclera anicteric, Cardiovascular; regular rate and rhythm with S1-S2, Pulmonary; clear bilaterally, Abdomen ;soft, nontender nondistended bowel sounds are active there is no palpable mass or hepatosplenomegaly, Rectal; exam not done, Neuro ;psych mood and affect appropriate       Assessment & Plan:   #64 56 year old white male with recent acute sigmoid diverticulitis documented on CT, patient has completed a course of Cipro and Flagyl and is currently asymptomatic #2.  Chronic GERD-patient  has been on twice daily PPI with omeprazole 40 mg twice daily, he will continue working towards decreasing omeprazole to 40 mg once daily and may substitute ranitidine 300 mg nightly to start. #3.  Constipation #4.  Antral gastritis at recent EGD, no H. pylori.  Continue omeprazole 40 mg p.o. Daily  Plan; add Benefiber 1 scoop daily in 8-10 ounces of water, we discussed as needed use of stool softeners. Patient advised to focus on hydration Continue goal to decrease omeprazole from 40 mg twice daily to 40 mg every morning.  He may use ranitidine 300 mg nightly as needed. Patient will follow up with Dr. Christella Brooks or myself on an as-needed basis.  Aleli Navedo S Florance Paolillo PA-C 10/06/2017   Cc: Lucky Cowboy, MD

## 2017-10-10 ENCOUNTER — Ambulatory Visit: Payer: 59

## 2017-10-10 DIAGNOSIS — E291 Testicular hypofunction: Secondary | ICD-10-CM

## 2017-10-10 MED ORDER — TESTOSTERONE CYPIONATE 200 MG/ML IM SOLN
200.0000 mg | INTRAMUSCULAR | Status: DC
  Administered 2017-10-10: 200 mg via INTRAMUSCULAR

## 2017-10-10 NOTE — Progress Notes (Signed)
Patient here for a NV for his Testosterone Cypionate 200 mg/ml 2 ml IM injection in theLEFTupper outer quadrant. Patient tolerated well and will return in 3 weeks for his next injection 

## 2017-10-19 ENCOUNTER — Other Ambulatory Visit: Payer: Self-pay | Admitting: Internal Medicine

## 2017-11-01 ENCOUNTER — Ambulatory Visit: Payer: 59

## 2017-11-01 DIAGNOSIS — E291 Testicular hypofunction: Secondary | ICD-10-CM | POA: Diagnosis not present

## 2017-11-01 MED ORDER — TESTOSTERONE CYPIONATE 200 MG/ML IM SOLN: 200.0000 mg | INTRAMUSCULAR | Status: DC

## 2017-11-01 NOTE — Progress Notes (Signed)
Patient here for a NV for his Testosterone Cypionate 200 mg/ml 2 ml IM injection in theRIGHT upper outer quadrant. Patient tolerated well and will return in 3 weeks for his next injection 

## 2017-11-21 ENCOUNTER — Ambulatory Visit: Payer: 59

## 2017-11-22 ENCOUNTER — Ambulatory Visit (INDEPENDENT_AMBULATORY_CARE_PROVIDER_SITE_OTHER): Payer: 59

## 2017-11-22 DIAGNOSIS — E291 Testicular hypofunction: Secondary | ICD-10-CM

## 2017-11-22 MED ORDER — TESTOSTERONE CYPIONATE 200 MG/ML IM SOLN
200.0000 mg | Freq: Once | INTRAMUSCULAR | Status: AC
  Administered 2017-11-22: 200 mg via INTRAMUSCULAR

## 2017-11-22 NOTE — Progress Notes (Signed)
Patient here for a NV for his Testosterone Cypionate 200 mg/ml 2 ml IM injection in theLEFT ventrogluteal. Patient tolerated well and will return in 3 weeks for his next injection.

## 2017-11-23 ENCOUNTER — Ambulatory Visit: Payer: Self-pay

## 2017-12-11 ENCOUNTER — Other Ambulatory Visit: Payer: Self-pay | Admitting: Gastroenterology

## 2017-12-26 ENCOUNTER — Ambulatory Visit: Payer: 59

## 2018-01-18 ENCOUNTER — Ambulatory Visit (INDEPENDENT_AMBULATORY_CARE_PROVIDER_SITE_OTHER): Payer: 59 | Admitting: *Deleted

## 2018-01-18 ENCOUNTER — Other Ambulatory Visit: Payer: Self-pay | Admitting: *Deleted

## 2018-01-18 DIAGNOSIS — E291 Testicular hypofunction: Secondary | ICD-10-CM | POA: Diagnosis not present

## 2018-01-18 MED ORDER — TESTOSTERONE CYPIONATE 200 MG/ML IM SOLN: INTRAMUSCULAR | 2 refills | Status: DC

## 2018-01-18 MED ORDER — TESTOSTERONE CYPIONATE 200 MG/ML IM SOLN
400.0000 mg | Freq: Once | INTRAMUSCULAR | Status: AC
  Administered 2018-01-18: 400 mg via INTRAMUSCULAR

## 2018-01-18 NOTE — Progress Notes (Signed)
Patient here for a NV for his Testosterone Cypionate 200 mg/ml 2 ml IM right upper outer quadrant injection.  Patient tolerated well and will return in 3 weeks for his next injection.

## 2018-02-12 ENCOUNTER — Ambulatory Visit (INDEPENDENT_AMBULATORY_CARE_PROVIDER_SITE_OTHER): Payer: 59

## 2018-02-12 DIAGNOSIS — E291 Testicular hypofunction: Secondary | ICD-10-CM | POA: Diagnosis not present

## 2018-02-12 MED ORDER — TESTOSTERONE CYPIONATE 200 MG/ML IM SOLN
200.0000 mg | INTRAMUSCULAR | Status: DC
  Administered 2018-02-12: 200 mg via INTRAMUSCULAR

## 2018-02-12 NOTE — Progress Notes (Signed)
Patient here for a NV for his Testosterone Cypionate 200 mg/ml 2 ml IM LEFTupper outer quadrant injection. Patient tolerated well and will return in 3 weeks for his next injection 

## 2018-03-12 ENCOUNTER — Ambulatory Visit (INDEPENDENT_AMBULATORY_CARE_PROVIDER_SITE_OTHER): Payer: 59

## 2018-03-12 DIAGNOSIS — E291 Testicular hypofunction: Secondary | ICD-10-CM

## 2018-03-12 MED ORDER — TESTOSTERONE CYPIONATE 200 MG/ML IM SOLN
200.0000 mg | Freq: Once | INTRAMUSCULAR | Status: AC
  Administered 2018-03-12: 200 mg via INTRAMUSCULAR

## 2018-03-12 NOTE — Progress Notes (Signed)
Patient here for a NV for his Testosterone Cypionate 200 mg/ml 2 ml IMRIGHTupper outer quadrant injection. Patient tolerated well and will return in 3 weeks for his next injection 

## 2018-03-22 DIAGNOSIS — M62838 Other muscle spasm: Secondary | ICD-10-CM | POA: Diagnosis not present

## 2018-03-22 DIAGNOSIS — M9901 Segmental and somatic dysfunction of cervical region: Secondary | ICD-10-CM | POA: Diagnosis not present

## 2018-04-06 ENCOUNTER — Ambulatory Visit (INDEPENDENT_AMBULATORY_CARE_PROVIDER_SITE_OTHER): Payer: 59

## 2018-04-06 VITALS — Ht 69.0 in

## 2018-04-06 DIAGNOSIS — E291 Testicular hypofunction: Secondary | ICD-10-CM

## 2018-04-06 MED ORDER — TESTOSTERONE CYPIONATE 200 MG/ML IM SOLN
200.0000 mg | INTRAMUSCULAR | Status: DC
  Administered 2018-04-06: 200 mg via INTRAMUSCULAR

## 2018-04-06 NOTE — Progress Notes (Signed)
Patient here for a NV for his Testosterone Cypionate 200 mg/ml 2 ml IM LEFTupper outer quadrant injection. Patient tolerated well and will return in 3 weeks for his next injection 

## 2018-04-24 ENCOUNTER — Ambulatory Visit: Payer: Self-pay

## 2018-05-01 ENCOUNTER — Ambulatory Visit (INDEPENDENT_AMBULATORY_CARE_PROVIDER_SITE_OTHER): Payer: 59

## 2018-05-01 DIAGNOSIS — E291 Testicular hypofunction: Secondary | ICD-10-CM

## 2018-05-01 MED ORDER — TESTOSTERONE CYPIONATE 200 MG/ML IM SOLN
200.0000 mg | Freq: Once | INTRAMUSCULAR | Status: AC
  Administered 2018-05-01: 200 mg via INTRAMUSCULAR

## 2018-05-01 NOTE — Progress Notes (Signed)
Patient here for a NV for his Testosterone Cypionate 200 mg/ml 2 ml IMRIGHTupper outer quadrant injection. Patient tolerated well and will return in 3 weeks for his next injection 

## 2018-05-25 ENCOUNTER — Ambulatory Visit (INDEPENDENT_AMBULATORY_CARE_PROVIDER_SITE_OTHER): Payer: 59

## 2018-05-25 DIAGNOSIS — E291 Testicular hypofunction: Secondary | ICD-10-CM

## 2018-05-25 MED ORDER — TESTOSTERONE CYPIONATE 200 MG/ML IM SOLN
200.0000 mg | Freq: Once | INTRAMUSCULAR | Status: AC
  Administered 2018-05-25: 200 mg via INTRAMUSCULAR

## 2018-05-25 NOTE — Progress Notes (Signed)
Patient here for a NV for his Testosterone Cypionate 200 mg/ml 2 ml IM LEFTupper outer quadrant injection. Patient tolerated well and will return in 3 weeks for his next injection 

## 2018-05-28 ENCOUNTER — Encounter: Payer: Self-pay | Admitting: Internal Medicine

## 2018-05-28 ENCOUNTER — Ambulatory Visit (INDEPENDENT_AMBULATORY_CARE_PROVIDER_SITE_OTHER): Payer: 59 | Admitting: Internal Medicine

## 2018-05-28 VITALS — BP 120/92 | HR 100 | Temp 97.7°F | Resp 16 | Ht 70.0 in | Wt 169.2 lb

## 2018-05-28 DIAGNOSIS — R0989 Other specified symptoms and signs involving the circulatory and respiratory systems: Secondary | ICD-10-CM | POA: Diagnosis not present

## 2018-05-28 MED ORDER — ATENOLOL 50 MG PO TABS: ORAL_TABLET | ORAL | 1 refills | Status: DC

## 2018-05-28 NOTE — Progress Notes (Addendum)
  Subjective:    Patient ID: Jay Brooks, male    DOB: 1960/12/20, 57 y.o.   MRN: [id number]  HPI  This nice 57 yo MWM with hx/o labile HTN presents with concern of recent home BP elevations in the range of 140-150's/ 70-90's. Denies any HT sx's as HA's, dizziness, CP, palpitations, dyspnea or dependent edema.   Medication Sig  . aspirin 81 MG tablet Take 81 mg by mouth daily.  Marland Kitchen atovaquone-proguanil (MALARONE) 250-100 MG TABS Take 1 tablet by mouth daily. As needed for work  . azelastine (ASTELIN) 0.1 % nasal spray  2 SPRAYS EACH NOSTRI TWICE A DAY  . TUMS  500 MG chewable tablet Chew 1 tablet by mouth daily.  . clindamycin (CLEOCIN T) 1 % lotion   . omeprazole  40 MG capsule TAKE 1 CAPSULE  ONCE DAILY  . PROBIOTIC DAILY Take 1 capsule by mouth daily.  . ranitidine  150 MG tablet Take 1 tablet (150 mg total) by mouth 2 (two) times daily.  . tamsulosin0.4 MG CAPS capsule Take 0.4 mg by mouth daily.  Marland Kitchen testosterone cypio 200 MG injec INJECT 2 ML IM EVERY 2 WEEKS  . NASACORT  Place 2 sprays into the nose daily.  . VENTOLIN HFA  inhaler PRN   Allergies  Allergen Reactions  . Allegra [Fexofenadine]   . Doxycycline Nausea Only  . Levaquin [Levofloxacin] Nausea And Vomiting    Patient states that after he took levaquin this past time he had nausea with vomiting  . Other     Benzol Peroxide- caused blisters     10 point systems review negative except as above.     Objective:   Physical Exam  BP 120/92 RA - 124/100 LA   P 100   T 97.7 F    R 16   Wt 169 lb 3.2 oz    SpO2 97%   BMI 24.99   Reck BP 146/96 RA sitting  HEENT - WNL. Neck - supple.  Chest - Clear equal BS. Cor - Nl HS. RRR w/o sig MGR. PP 1(+). No edema. MS- FROM w/o deformities.  Gait Nl. Neuro -  Nl w/o focal abnormalities.    Assessment & Plan:   1. Labile hypertension  - atenolol (TENORMIN) 50 MG tablet; Take 1 to 2 tablets / daily as directed for BP  Dispense: 90 tablet; Refill: 1  - discussed  meds/SE's  - Recommend continue BP monitoring & call if remain elevated.

## 2018-05-29 ENCOUNTER — Encounter: Payer: Self-pay | Admitting: Internal Medicine

## 2018-05-29 NOTE — Patient Instructions (Signed)

## 2018-05-30 ENCOUNTER — Other Ambulatory Visit: Payer: Self-pay | Admitting: Gastroenterology

## 2018-06-21 ENCOUNTER — Ambulatory Visit (INDEPENDENT_AMBULATORY_CARE_PROVIDER_SITE_OTHER): Payer: 59

## 2018-06-21 DIAGNOSIS — E291 Testicular hypofunction: Secondary | ICD-10-CM

## 2018-06-21 MED ORDER — TESTOSTERONE CYPIONATE 200 MG/ML IM SOLN
200.0000 mg | INTRAMUSCULAR | Status: DC
  Administered 2018-06-21: 200 mg via INTRAMUSCULAR

## 2018-06-21 NOTE — Progress Notes (Signed)
Patient here for a NV for his Testosterone Cypionate 200 mg/ml 2 ml IMRIGHTupper outer quadrant injection. Patient tolerated well and will return in 3 weeks for his next injection 

## 2018-07-11 ENCOUNTER — Ambulatory Visit (INDEPENDENT_AMBULATORY_CARE_PROVIDER_SITE_OTHER): Payer: 59

## 2018-07-11 DIAGNOSIS — E291 Testicular hypofunction: Secondary | ICD-10-CM

## 2018-07-11 MED ORDER — TESTOSTERONE CYPIONATE 200 MG/ML IM SOLN
200.0000 mg | Freq: Once | INTRAMUSCULAR | Status: AC
  Administered 2018-07-11: 200 mg via INTRAMUSCULAR

## 2018-07-11 NOTE — Progress Notes (Signed)
Patient here for a NV for his Testosterone Cypionate 200 mg/ml 2 ml IM LEFTupper outer quadrant injection. Patient tolerated well and will return in 3 weeks for his next injection 

## 2018-07-19 DIAGNOSIS — H04123 Dry eye syndrome of bilateral lacrimal glands: Secondary | ICD-10-CM | POA: Diagnosis not present

## 2018-07-19 DIAGNOSIS — D3132 Benign neoplasm of left choroid: Secondary | ICD-10-CM | POA: Diagnosis not present

## 2018-07-29 ENCOUNTER — Encounter: Payer: Self-pay | Admitting: Internal Medicine

## 2018-07-29 NOTE — Progress Notes (Signed)
[facility]   Unk Pinto, M.D.     Jay Brooks. Silverio Lay, P.A.-C Liane Comber, [place]                [address] [place] [address] Telephone [phone] Telefax [phone] Annual  Screening/Preventative Visit  & Comprehensive Evaluation & Examination     This very nice 57 y.o. MWM presents for a Screening /Preventative Visit & comprehensive evaluation and management of multiple medical co-morbidities.  Patient has been followed for HTN, HLD, BPH/LUTS,  Prediabetes, Testosterone and Vitamin D Deficiency. Patient's GERD is controlled with his meds.     Patient has had chronic problems with sleep and has seen specialists at [facility], [facility] locally with Dr Berton Mount young last sleep study 6 years ago was at the [facility]. 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 Consequent of his poor sleep does report at least 3-4 days /week a sensation or feeling of numbness of his head lasting most of the morning. He relates that his insurance policy would likely allow him 2 years Disability at full pay for some semblance of sleep related problems. He specifically denies any dizziness - postural or otherwise - or a sensation of poor balance or equilibrium and has had no visual difficulties as blurring or diplopia.      Patient has been expectantly monitored for HTN since 2005.  Patient's BP has been controlled and today's BP is at goal - 134/82. Patient denies any cardiac symptoms as chest pain, palpitations, shortness of breath, dizziness or ankle swelling.     Patient's hyperlipidemia is controlled with diet. Last lipids were at goal albeit elevated Trig's:  Lab Results  Component Value Date   CHOL 142 07/11/2017   HDL 32 (L) 07/11/2017   LDLCALC 73 07/11/2017   TRIG 306 (H) 07/11/2017   CHOLHDL 4.4 07/11/2017       Patient has hx/o prediabetes/Insulin Resistance  (A1c 5.0%/elevated Insulin "55"/2014) and patient denies reactive hypoglycemic symptoms, visual blurring, diabetic polys or paresthesias. Last A1c was Normal & at goal: Lab Results  Component Value Date   HGBA1C 4.8 07/11/2017          Patient haas hx/o Testosterone Deficiency ("150" in 2010) and is on replacement by injection with improved stamina & sense of well being.      Finally, patient has history of Vitamin D Deficiency  ("32"/2009) and last vitamin D was still low (goal 70-100): Lab Results  Component Value Date   VD25OH 47 07/11/2017   Current Outpatient Medications on File Prior to Visit  Medication Sig  . aspirin 81 MG  Take  daily.  Marland Kitchen atenolol  50 MG  Take 1 to 2 tablets /daily as directed for BP  . MALARONE 250-100 MG Take 1 tablet  daily. As needed for work  . TUMS  500 MG Chew 1 tablet  daily.  Marland Kitchen CLEOCIN T 1 % lotion   . Probiotic  Take 1 capsule by mouth daily.  . ranitidine150 MG  Take 1 tablet (150 mg total) by mouth 2 (two) times daily.  . tamsulosin  0.4 MG  Take 0.4 mg by mouth daily.  Marland Kitchen NASACORT   nasal inhaler Place 2 sprays into the nose daily.  Enid Cutter HFA  inhaler  Allergies  Allergen Reactions  . Allegra [Fexofenadine]   . Doxycycline Nausea Only  . Levaquin [Levofloxacin] Nausea And Vomiting    Patient states that after he took levaquin this past time he had nausea with vomiting  . Other     Benzol Peroxide- caused blisters   Past Medical History:  Diagnosis Date  . Allergy   . Chronic sinusitis   . Diverticulosis of colon (without mention of hemorrhage)   . GERD (gastroesophageal reflux disease)   . Lyme disease   . Sleep deficient    Health Maintenance  Topic Date Due  . Hepatitis C Screening  [birth year]  . HIV Screening  02/04/1974  . COLONOSCOPY  02/04/2009  . INFLUENZA VACCINE  06/07/2018  . TETANUS/TDAP  07/08/2020   Immunization History  Administered Date(s)  Administered  . Influenza Inj Mdck Quad With Preservative 08/29/2017, 07/30/2018  . Influenza Split 08/07/2012, 09/25/2014  . Influenza, Seasonal, Injecte, Preservative Fre 09/25/2015  . Influenza,inj,quad, With Preservative 09/15/2016  . PPD Test 12/01/2014, 12/08/2015, 07/11/2017, 07/30/2018  . Tdap 07/08/2010   Last Colon - 12/21/2010 - Dr Ardis Hughs - recc 10 yr f/u due Feb 2024  Past Surgical History:  Procedure Laterality Date  . COLONOSCOPY    . HERNIA REPAIR    . UPPER GASTROINTESTINAL ENDOSCOPY    . VASECTOMY     Family History  Problem Relation Age of Onset  . Breast cancer Maternal Grandmother   . Colon polyps Brother   . Colon polyps Maternal Uncle   . Cancer Mother 61       pancreatic  . Colon cancer Neg Hx   . Esophageal cancer Neg Hx   . Stomach cancer Neg Hx   . Ulcerative colitis Neg Hx    Social History   Socioeconomic History  . Marital status: Married    Spouse name: Maudie Mercury  . Number of children: 1  Occupational History  . Occupation: PILOT    Employer: [employer]  Tobacco Use  . Smoking status: Never Smoker  . Smokeless tobacco: Never Used  Substance and Sexual Activity  . Alcohol use: Yes    Comment: occ  . Drug use: No  . Sexual activity: Not on file  Social History Narrative   Daily caffeine     ROS Constitutional: Denies fever, chills, weight loss/gain, headaches, insomnia,  night sweats or change in appetite. Does c/o fatigue. Eyes: Denies redness, blurred vision, diplopia, discharge, itchy or watery eyes.  ENT: Denies discharge, congestion, post nasal drip, epistaxis, sore throat, earache, hearing loss, dental pain, Tinnitus, Vertigo, Sinus pain or snoring.  Cardio: Denies chest pain, palpitations, irregular heartbeat, syncope, dyspnea, diaphoresis, orthopnea, PND, claudication or edema Respiratory: denies cough, dyspnea, DOE, pleurisy, hoarseness, laryngitis or wheezing.  Gastrointestinal: Denies dysphagia, heartburn, reflux, water brash,  pain, cramps, nausea, vomiting, bloating, diarrhea, constipation, hematemesis, melena, hematochezia, jaundice or hemorrhoids Genitourinary: Denies dysuria, frequency, urgency, nocturia, hesitancy, discharge, hematuria or flank pain Musculoskeletal: Denies arthralgia, myalgia, stiffness, Jt. Swelling, pain, limp or strain/sprain. Denies Falls. Skin: Denies puritis, rash, hives, warts, acne, eczema or change in skin lesion Neuro: No weakness, tremor, incoordination, spasms, paresthesia or pain Psychiatric: Denies confusion, memory loss or sensory loss. Denies Depression. Endocrine: Denies change in weight, skin, hair change, nocturia, and paresthesia, diabetic polys, visual blurring or hyper / hypo glycemic episodes.  Heme/Lymph: No excessive bleeding, bruising or enlarged lymph nodes.  Physical Exam  BP 134/82   Pulse 76   Temp 97.8 F (36.6 C)   Resp  16   Ht 5\' 10"  (1.778 m)   Wt 168 lb (76.2 kg)   BMI 24.11 kg/m   General Appearance: Well nourished and well groomed and in no apparent distress.  Eyes: PERRLA, EOMs, conjunctiva no swelling or erythema, normal fundi and vessels. Sinuses: No frontal/maxillary tenderness ENT/Mouth: EACs patent / TMs  nl. Nares clear without erythema, swelling, mucoid exudates. Oral hygiene is good. No erythema, swelling, or exudate. Tongue normal, non-obstructing. Tonsils not swollen or erythematous. Hearing normal.  Neck: Supple, thyroid not palpable. No bruits, nodes or JVD. Respiratory: Respiratory effort normal.  BS equal and clear bilateral without rales, rhonci, wheezing or stridor. Cardio: Heart sounds are normal with regular rate and rhythm and no murmurs, rubs or gallops. Peripheral pulses are normal and equal bilaterally without edema. No aortic or femoral bruits. Chest: symmetric with normal excursions and percussion.  Abdomen: Soft, with Nl bowel sounds. Nontender, no guarding, rebound, hernias, masses, or organomegaly.  Lymphatics: Non tender  without lymphadenopathy.  Genitourinary: No hernias.Testes nl. DRE - prostate nl for age - smooth & firm w/o nodules. Musculoskeletal: Full ROM all peripheral extremities, joint stability, 5/5 strength, and normal gait. Skin: Warm and dry without rashes, lesions, cyanosis, clubbing or  ecchymosis.  Neuro: Cranial nerves intact, reflexes equal bilaterally. Normal muscle tone, no cerebellar symptoms. Sensation intact.  Pysch: Alert and oriented X 3 with normal affect, insight and judgment appropriate.   Assessment and Plan  1. Annual Preventative/Screening Exam   2. Labile hypertension  - EKG 12-Lead - [place], RETROPERITNL ABD,  LTD - Urinalysis, Routine w reflex microscopic - Microalbumin / creatinine urine ratio - CBC with Differential/Platelet - COMPLETE METABOLIC PANEL WITH GFR - Magnesium - TSH  3. Hyperlipidemia, mixed  - EKG 12-Lead - [place], RETROPERITNL ABD,  LTD - COMPLETE METABOLIC PANEL WITH GFR - Lipid panel - TSH  4. Prediabetes  - EKG 12-Lead - [place], RETROPERITNL ABD,  LTD - Hemoglobin A1c - Insulin, random  5. Vitamin D deficiency  - VITAMIN D 25 Hydroxl  6. Testosterone deficiency  - Testosterone  7. Gastroesophageal reflux disease  - CBC with Differential/Platelet  8. Screening for colorectal cancer  - POC Hemoccult Bld/Stl  9. Prostate cancer screening  - PSA  10. Screening examination for pulmonary tuberculosis   11. Screening for ischemic heart disease   12. Screening for AAA (aortic abdominal aneurysm)   13. Fatigue, unspecified type  - Iron,Total/Total Iron Binding Cap - Vitamin B12  14. Medication management  - Urinalysis, Routine w reflex microscopic - Microalbumin / creatinine urine ratio - Testosterone - CBC with Differential/Platelet - COMPLETE METABOLIC PANEL WITH GFR - Magnesium - Lipid panel - TSH - Hemoglobin A1c - Insulin, random - VITAMIN D 25 Hydroxyl                                                                                                     +       Patient was counseled in prudent diet, weight control to achieve/maintain BMI less than 25, BP monitoring, regular exercise and medications as discussed.  Discussed med effects and SE's. Routine screening labs and tests as requested with regular follow-up as recommended. Over 40 minutes of exam, counseling, chart review and high complex critical decision making was performed

## 2018-07-29 NOTE — Patient Instructions (Signed)
Preventive Care for Adults  A healthy lifestyle and preventive care can promote health and wellness. Preventive health guidelines for men include the following key practices:  A routine yearly physical is a good way to check with your health care provider about your health and preventative screening. It is a chance to share any concerns and updates on your health and to receive a thorough exam.  Visit your dentist for a routine exam and preventative care every 6 months. Brush your teeth twice a day and floss once a day. Good oral hygiene prevents tooth decay and gum disease.  The frequency of eye exams is based on your age, health, family medical history, use of contact lenses, and other factors. Follow your health care provider's recommendations for frequency of eye exams.  Eat a healthy diet. Foods such as vegetables, fruits, whole grains, low-fat dairy products, and lean protein foods contain the nutrients you need without too many calories. Decrease your intake of foods high in solid fats, added sugars, and salt. Eat the right amount of calories for you. Get information about a proper diet from your health care provider, if necessary.  Regular physical exercise is one of the most important things you can do for your health. Most adults should get at least 150 minutes of moderate-intensity exercise (any activity that increases your heart rate and causes you to sweat) each week. In addition, most adults need muscle-strengthening exercises on 2 or more days a week.  Maintain a healthy weight. The body mass index (BMI) is a screening tool to identify possible weight problems. It provides an estimate of body fat based on height and weight. Your health care provider can find your BMI and can help you achieve or maintain a healthy weight. For adults 20 years and older:  A BMI below 18.5 is considered underweight.  A BMI of 18.5 to 24.9 is normal.  A BMI of 25 to 29.9 is considered overweight.  A  BMI of 30 and above is considered obese.  Maintain normal blood lipids and cholesterol levels by exercising and minimizing your intake of saturated fat. Eat a balanced diet with plenty of fruit and vegetables. Blood tests for lipids and cholesterol should begin at age 20 and be repeated every 5 years. If your lipid or cholesterol levels are high, you are over 50, or you are at high risk for heart disease, you may need your cholesterol levels checked more frequently. Ongoing high lipid and cholesterol levels should be treated with medicines if diet and exercise are not working.  If you smoke, find out from your health care provider how to quit. If you do not use tobacco, do not start.  Lung cancer screening is recommended for adults aged 55-80 years who are at high risk for developing lung cancer because of a history of smoking. A yearly low-dose CT scan of the lungs is recommended for people who have at least a 30-pack-year history of smoking and are a current smoker or have quit within the past 15 years. A pack year of smoking is smoking an average of 1 pack of cigarettes a day for 1 year (for example: 1 pack a day for 30 years or 2 packs a day for 15 years). Yearly screening should continue until the smoker has stopped smoking for at least 15 years. Yearly screening should be stopped for people who develop a health problem that would prevent them from having lung cancer treatment.  If you choose to drink alcohol, do   not have more than 2 drinks per day. One drink is considered to be 12 ounces (355 mL) of beer, 5 ounces (148 mL) of wine, or 1.5 ounces (44 mL) of liquor.  Avoid use of street drugs. Do not share needles with anyone. Ask for help if you need support or instructions about stopping the use of drugs.  High blood pressure causes heart disease and increases the risk of stroke. Your blood pressure should be checked at least every 1-2 years. Ongoing high blood pressure should be treated with  medicines, if weight loss and exercise are not effective.  If you are 45-79 years old, ask your health care provider if you should take aspirin to prevent heart disease.  Diabetes screening involves taking a blood sample to check your fasting blood sugar level. This should be done once every 3 years, after age 45, if you are within normal weight and without risk factors for diabetes. Testing should be considered at a younger age or be carried out more frequently if you are overweight and have at least 1 risk factor for diabetes.  Colorectal cancer can be detected and often prevented. Most routine colorectal cancer screening begins at the age of 50 and continues through age 75. However, your health care provider may recommend screening at an earlier age if you have risk factors for colon cancer. On a yearly basis, your health care provider may provide home test kits to check for hidden blood in the stool. Use of a small camera at the end of a tube to directly examine the colon (sigmoidoscopy or colonoscopy) can detect the earliest forms of colorectal cancer. Talk to your health care provider about this at age 50, when routine screening begins. Direct exam of the colon should be repeated every 5-10 years through age 75, unless early forms of precancerous polyps or small growths are found.   Talk with your health care provider about prostate cancer screening.  Testicular cancer screening isrecommended for adult males. Screening includes self-exam, a health care provider exam, and other screening tests. Consult with your health care provider about any symptoms you have or any concerns you have about testicular cancer.  Use sunscreen. Apply sunscreen liberally and repeatedly throughout the day. You should seek shade when your shadow is shorter than you. Protect yourself by wearing long sleeves, pants, a wide-brimmed hat, and sunglasses year round, whenever you are outdoors.  Once a month, do a whole-body  skin exam, using a mirror to look at the skin on your back. Tell your health care provider about new moles, moles that have irregular borders, moles that are larger than a pencil eraser, or moles that have changed in shape or color.  Stay current with required vaccines (immunizations).  Influenza vaccine. All adults should be immunized every year.  Tetanus, diphtheria, and acellular pertussis (Td, Tdap) vaccine. An adult who has not previously received Tdap or who does not know his vaccine status should receive 1 dose of Tdap. This initial dose should be followed by tetanus and diphtheria toxoids (Td) booster doses every 10 years. Adults with an unknown or incomplete history of completing a 3-dose immunization series with Td-containing vaccines should begin or complete a primary immunization series including a Tdap dose. Adults should receive a Td booster every 10 years.  Varicella vaccine. An adult without evidence of immunity to varicella should receive 2 doses or a second dose if he has previously received 1 dose.  Human papillomavirus (HPV) vaccine. Males aged 13-21 years   who have not received the vaccine previously should receive the 3-dose series. Males aged 22-26 years may be immunized. Immunization is recommended through the age of 26 years for any male who has sex with males and did not get any or all doses earlier. Immunization is recommended for any person with an immunocompromised condition through the age of 26 years if he did not get any or all doses earlier. During the 3-dose series, the second dose should be obtained 4-8 weeks after the first dose. The third dose should be obtained 24 weeks after the first dose and 16 weeks after the second dose.  Zoster vaccine. One dose is recommended for adults aged 60 years or older unless certain conditions are present.    PREVNAR  - Pneumococcal 13-valent conjugate (PCV13) vaccine. When indicated, a person who is uncertain of his immunization  history and has no record of immunization should receive the PCV13 vaccine. An adult aged 19 years or older who has certain medical conditions and has not been previously immunized should receive 1 dose of PCV13 vaccine. This PCV13 should be followed with a dose of pneumococcal polysaccharide (PPSV23) vaccine. The PPSV23 vaccine dose should be obtained at least 1 r more year(s) after the dose of PCV13 vaccine. An adult aged 19 years or older who has certain medical conditions and previously received 1 or more doses of PPSV23 vaccine should receive 1 dose of PCV13. The PCV13 vaccine dose should be obtained 1 or more years after the last PPSV23 vaccine dose.    PNEUMOVAX - Pneumococcal polysaccharide (PPSV23) vaccine. When PCV13 is also indicated, PCV13 should be obtained first. All adults aged 65 years and older should be immunized. An adult younger than age 65 years who has certain medical conditions should be immunized. Any person who resides in a nursing home or long-term care facility should be immunized. An adult smoker should be immunized. People with an immunocompromised condition and certain other conditions should receive both PCV13 and PPSV23 vaccines. People with human immunodeficiency virus (HIV) infection should be immunized as soon as possible after diagnosis. Immunization during chemotherapy or radiation therapy should be avoided. Routine use of PPSV23 vaccine is not recommended for American Indians, [place] Natives, or people younger than 65 years unless there are medical conditions that require PPSV23 vaccine. When indicated, people who have unknown immunization and have no record of immunization should receive PPSV23 vaccine. One-time revaccination 5 years after the first dose of PPSV23 is recommended for people aged 19-64 years who have chronic kidney failure, nephrotic syndrome, asplenia, or immunocompromised conditions. People who received 1-2 doses of PPSV23 before age 65 years should receive  another dose of PPSV23 vaccine at age 65 years or later if at least 5 years have passed since the previous dose. Doses of PPSV23 are not needed for people immunized with PPSV23 at or after age 65 years.    Hepatitis A vaccine. Adults who wish to be protected from this disease, have certain high-risk conditions, work with hepatitis A-infected animals, work in hepatitis A research labs, or travel to or work in countries with a high rate of hepatitis A should be immunized. Adults who were previously unvaccinated and who anticipate close contact with an international adoptee during the first 60 days after arrival in the [place] from a country with a high rate of hepatitis A should be immunized.    Hepatitis B vaccine. Adults should be immunized if they wish to be protected from this disease, have certain high-risk   conditions, may be exposed to blood or other infectious body fluids, are household contacts or sex partners of hepatitis B positive people, are clients or workers in certain care facilities, or travel to or work in countries with a high rate of hepatitis B.   Preventive Service / Frequency   Ages 40 to 64  Blood pressure check.  Lipid and cholesterol check  Lung cancer screening. / Every year if you are aged 55-80 years and have a 30-pack-year history of smoking and currently smoke or have quit within the past 15 years. Yearly screening is stopped once you have quit smoking for at least 15 years or develop a health problem that would prevent you from having lung cancer treatment.  Fecal occult blood test (FOBT) of stool. / Every year beginning at age 50 and continuing until age 75. You may not have to do this test if you get a colonoscopy every 10 years.  Flexible sigmoidoscopy** or colonoscopy.** / Every 5 years for a flexible sigmoidoscopy or every 10 years for a colonoscopy beginning at age 50 and continuing until age 75. Screening for abdominal aortic aneurysm (AAA)  by  ultrasound is recommended for people who have history of high blood pressure or who are current or former smokers. +++++++++++ Recommend Adult Low Dose Aspirin or  coated  Aspirin 81 mg daily  To reduce risk of Colon Cancer 20 %,  Skin Cancer 26 % ,  Malignant Melanoma 46%  and  Pancreatic cancer 60% ++++++++++++++++++++ Vitamin D goal  is between 70-100.  Please make sure that you are taking your Vitamin D as directed.  It is very important as a natural anti-inflammatory  helping hair, skin, and nails, as well as reducing stroke and heart attack risk.  It helps your bones and helps with mood. It also decreases numerous cancer risks so please take it as directed.  Low Vit D is associated with a 200-300% higher risk for CANCER  and 200-300% higher risk for HEART   ATTACK  &  STROKE.   ...................................... It is also associated with higher death rate at younger ages,  autoimmune diseases like Rheumatoid arthritis, Lupus, Multiple Sclerosis.    Also many other serious conditions, like depression, Alzheimer's Dementia, infertility, muscle aches, fatigue, fibromyalgia - just to name a few. +++++++++++++++++++++ Recommend the book "The END of DIETING" by Dr Joel Fuhrman  & the book "The END of DIABETES " by Dr Joel Fuhrman At [employer] - get book & Audio CD's    Being diabetic has a  300% increased risk for heart attack, stroke, cancer, and alzheimer- type vascular dementia. It is very important that you work harder with diet by avoiding all foods that are white. Avoid white rice (brown & wild rice is OK), white potatoes (sweetpotatoes in moderation is OK), White bread or wheat bread or anything made out of white flour like bagels, donuts, rolls, buns, biscuits, cakes, pastries, cookies, pizza crust, and pasta (made from white flour & egg whites) - vegetarian pasta or spinach or wheat pasta is OK. Multigrain breads like Arnold's or [employer], or multigrain sandwich  thins or flatbreads.  Diet, exercise and weight loss can reverse and cure diabetes in the early stages.  Diet, exercise and weight loss is very important in the control and prevention of complications of diabetes which affects every system in your body, ie. Brain - dementia/stroke, eyes - glaucoma/blindness, heart - heart attack/heart failure, kidneys - dialysis, stomach - gastric paralysis, intestines - malabsorption,   nerves - severe painful neuritis, circulation - gangrene & loss of a leg(s), and finally cancer and Alzheimers.    I recommend avoid fried & greasy foods,  sweets/candy, white rice (brown or wild rice or Quinoa is OK), white potatoes (sweet potatoes are OK) - anything made from white flour - bagels, doughnuts, rolls, buns, biscuits,white and wheat breads, pizza crust and traditional pasta made of white flour & egg white(vegetarian pasta or spinach or wheat pasta is OK).  Multi-grain bread is OK - like multi-grain flat bread or sandwich thins. Avoid alcohol in excess. Exercise is also important.    Eat all the vegetables you want - avoid meat, especially red meat and dairy - especially cheese.  Cheese is the most concentrated form of trans-fats which is the worst thing to clog up our arteries. Veggie cheese is OK which can be found in the fresh produce section at [employer] or [employer] or [employer]  ++++++++++++++++++++++ DASH Eating Plan  DASH stands for "Dietary Approaches to Stop Hypertension."   The DASH eating plan is a healthy eating plan that has been shown to reduce high blood pressure (hypertension). Additional health benefits may include reducing the risk of type 2 diabetes mellitus, heart disease, and stroke. The DASH eating plan may also help with weight loss. WHAT DO I NEED TO KNOW ABOUT THE DASH EATING PLAN? For the DASH eating plan, you will follow these general guidelines:  Choose foods with a percent daily value for sodium of less than 5% (as listed on the food  label).  Use salt-free seasonings or herbs instead of table salt or sea salt.  Check with your health care provider or pharmacist before using salt substitutes.  Eat lower-sodium products, often labeled as "lower sodium" or "no salt added."  Eat fresh foods.  Eat more vegetables, fruits, and low-fat dairy products.  Choose whole grains. Look for the word "whole" as the first word in the ingredient list.  Choose fish   Limit sweets, desserts, sugars, and sugary drinks.  Choose heart-healthy fats.  Eat veggie cheese   Eat more home-cooked food and less restaurant, buffet, and fast food.  Limit fried foods.  Cook foods using methods other than frying.  Limit canned vegetables. If you do use them, rinse them well to decrease the sodium.  When eating at a restaurant, ask that your food be prepared with less salt, or no salt if possible.                      WHAT FOODS CAN I EAT? Read Dr Joel Fuhrman's books on The End of Dieting & The End of Diabetes  Grains Whole grain or whole wheat bread. Brown rice. Whole grain or whole wheat pasta. Quinoa, bulgur, and whole grain cereals. Low-sodium cereals. Corn or whole wheat flour tortillas. Whole grain cornbread. Whole grain crackers. Low-sodium crackers.  Vegetables Fresh or frozen vegetables (raw, steamed, roasted, or grilled). Low-sodium or reduced-sodium tomato and vegetable juices. Low-sodium or reduced-sodium tomato sauce and paste. Low-sodium or reduced-sodium canned vegetables.   Fruits All fresh, canned (in natural juice), or frozen fruits.  Protein Products  All fish and seafood.  Dried beans, peas, or lentils. Unsalted nuts and seeds. Unsalted canned beans.  Dairy Low-fat dairy products, such as skim or 1% milk, 2% or reduced-fat cheeses, low-fat ricotta or cottage cheese, or plain low-fat yogurt. Low-sodium or reduced-sodium cheeses.  Fats and Oils Tub margarines without trans fats. Light or reduced-fat mayonnaise    and salad dressings (reduced sodium). Avocado. Safflower, olive, or canola oils. Natural peanut or almond butter.  Other Unsalted popcorn and pretzels. The items listed above may not be a complete list of recommended foods or beverages. Contact your dietitian for more options.  +++++++++++++++++++  WHAT FOODS ARE NOT RECOMMENDED? Grains/ White flour or wheat flour White bread. White pasta. White rice. Refined cornbread. Bagels and croissants. Crackers that contain trans fat.  Vegetables  Creamed or fried vegetables. Vegetables in a . Regular canned vegetables. Regular canned tomato sauce and paste. Regular tomato and vegetable juices.  Fruits Dried fruits. Canned fruit in light or heavy syrup. Fruit juice.  Meat and Other Protein Products Meat in general - RED meat & White meat.  Fatty cuts of meat. Ribs, chicken wings, all processed meats as bacon, sausage, bologna, salami, fatback, hot dogs, bratwurst and packaged luncheon meats.  Dairy Whole or 2% milk, cream, half-and-half, and cream cheese. Whole-fat or sweetened yogurt. Full-fat cheeses or blue cheese. Non-dairy creamers and whipped toppings. Processed cheese, cheese spreads, or cheese curds.  Condiments Onion and garlic salt, seasoned salt, table salt, and sea salt. Canned and packaged gravies. Worcestershire sauce. Tartar sauce. Barbecue sauce. Teriyaki sauce. Soy sauce, including reduced sodium. Steak sauce. Fish sauce. Oyster sauce. Cocktail sauce. Horseradish. Ketchup and mustard. Meat flavorings and tenderizers. Bouillon cubes. Hot sauce. Tabasco sauce. Marinades. Taco seasonings. Relishes.  Fats and Oils Butter, stick margarine, lard, shortening and bacon fat. Coconut, palm kernel, or palm oils. Regular salad dressings.  Pickles and olives. Salted popcorn and pretzels.  The items listed above may not be a complete list of foods and beverages to avoid.    m

## 2018-07-30 ENCOUNTER — Ambulatory Visit (INDEPENDENT_AMBULATORY_CARE_PROVIDER_SITE_OTHER): Payer: 59 | Admitting: Internal Medicine

## 2018-07-30 VITALS — BP 134/82 | HR 76 | Temp 97.8°F | Resp 16 | Ht 70.0 in | Wt 168.0 lb

## 2018-07-30 DIAGNOSIS — Z1212 Encounter for screening for malignant neoplasm of rectum: Secondary | ICD-10-CM

## 2018-07-30 DIAGNOSIS — Z0001 Encounter for general adult medical examination with abnormal findings: Secondary | ICD-10-CM

## 2018-07-30 DIAGNOSIS — Z79899 Other long term (current) drug therapy: Secondary | ICD-10-CM | POA: Diagnosis not present

## 2018-07-30 DIAGNOSIS — R7303 Prediabetes: Secondary | ICD-10-CM | POA: Diagnosis not present

## 2018-07-30 DIAGNOSIS — R0989 Other specified symptoms and signs involving the circulatory and respiratory systems: Secondary | ICD-10-CM

## 2018-07-30 DIAGNOSIS — E559 Vitamin D deficiency, unspecified: Secondary | ICD-10-CM

## 2018-07-30 DIAGNOSIS — K219 Gastro-esophageal reflux disease without esophagitis: Secondary | ICD-10-CM

## 2018-07-30 DIAGNOSIS — I10 Essential (primary) hypertension: Secondary | ICD-10-CM | POA: Diagnosis not present

## 2018-07-30 DIAGNOSIS — Z1211 Encounter for screening for malignant neoplasm of colon: Secondary | ICD-10-CM

## 2018-07-30 DIAGNOSIS — Z0000 Encounter for general adult medical examination without abnormal findings: Secondary | ICD-10-CM | POA: Diagnosis not present

## 2018-07-30 DIAGNOSIS — Z111 Encounter for screening for respiratory tuberculosis: Secondary | ICD-10-CM | POA: Diagnosis not present

## 2018-07-30 DIAGNOSIS — E349 Endocrine disorder, unspecified: Secondary | ICD-10-CM

## 2018-07-30 DIAGNOSIS — Z23 Encounter for immunization: Secondary | ICD-10-CM | POA: Diagnosis not present

## 2018-07-30 DIAGNOSIS — Z8249 Family history of ischemic heart disease and other diseases of the circulatory system: Secondary | ICD-10-CM

## 2018-07-30 DIAGNOSIS — E782 Mixed hyperlipidemia: Secondary | ICD-10-CM | POA: Diagnosis not present

## 2018-07-30 DIAGNOSIS — Z136 Encounter for screening for cardiovascular disorders: Secondary | ICD-10-CM

## 2018-07-30 DIAGNOSIS — Z125 Encounter for screening for malignant neoplasm of prostate: Secondary | ICD-10-CM

## 2018-07-30 DIAGNOSIS — R5383 Other fatigue: Secondary | ICD-10-CM

## 2018-07-30 MED ORDER — TESTOSTERONE CYPIONATE 200 MG/ML IM SOLN: INTRAMUSCULAR | 2 refills | Status: DC

## 2018-07-30 MED ORDER — TESTOSTERONE CYPIONATE 200 MG/ML IM SOLN
400.0000 mg | Freq: Once | INTRAMUSCULAR | Status: AC
  Administered 2018-07-30: 400 mg via INTRAMUSCULAR

## 2018-07-30 MED ORDER — ESOMEPRAZOLE MAGNESIUM 40 MG PO CPDR: DELAYED_RELEASE_CAPSULE | ORAL | 1 refills | Status: DC

## 2018-07-31 LAB — URINALYSIS, ROUTINE W REFLEX MICROSCOPIC
Bilirubin Urine: NEGATIVE
Glucose, UA: NEGATIVE
Hgb urine dipstick: NEGATIVE
Ketones, ur: NEGATIVE
Leukocytes, UA: NEGATIVE
Nitrite: NEGATIVE
Protein, ur: NEGATIVE
Specific Gravity, Urine: 1.01 (ref 1.001–1.03)
pH: 7 (ref 5.0–8.0)

## 2018-07-31 LAB — COMPLETE METABOLIC PANEL WITH GFR
AG Ratio: 1.8 (calc) (ref 1.0–2.5)
ALT: 14 U/L (ref 9–46)
AST: 23 U/L (ref 10–35)
Albumin: 4.6 g/dL (ref 3.6–5.1)
Alkaline phosphatase (APISO): 40 U/L (ref 40–115)
BUN: 16 mg/dL (ref 7–25)
CO2: 32 mmol/L (ref 20–32)
Calcium: 9.6 mg/dL (ref 8.6–10.3)
Chloride: 101 mmol/L (ref 98–110)
Creat: 1.14 mg/dL (ref 0.70–1.33)
GFR, Est African American: 81 mL/min/{1.73_m2} (ref >=60)
GFR, Est Non African American: 70 mL/min/{1.73_m2} (ref >=60)
Globulin: 2.5 g/dL (calc) (ref 1.9–3.7)
Glucose, Bld: 83 mg/dL (ref 65–99)
Potassium: 4.6 mmol/L (ref 3.5–5.3)
Sodium: 137 mmol/L (ref 135–146)
Total Bilirubin: 0.6 mg/dL (ref 0.2–1.2)
Total Protein: 7.1 g/dL (ref 6.1–8.1)

## 2018-07-31 LAB — CBC WITH DIFFERENTIAL/PLATELET
Basophils Absolute: 30 cells/uL (ref 0–200)
Basophils Relative: 0.5 %
Eosinophils Absolute: 72 cells/uL (ref 15–500)
Eosinophils Relative: 1.2 %
HCT: 48.1 % (ref 38.5–50.0)
Hemoglobin: 16.6 g/dL (ref 13.2–17.1)
Lymphs Abs: 1212 cells/uL (ref 850–3900)
MCH: 32 pg (ref 27.0–33.0)
MCHC: 34.5 g/dL (ref 32.0–36.0)
MCV: 92.7 fL (ref 80.0–100.0)
MPV: 10 fL (ref 7.5–12.5)
Monocytes Relative: 12.4 %
Neutro Abs: 3942 cells/uL (ref 1500–7800)
Neutrophils Relative %: 65.7 %
Platelets: 188 10*3/uL (ref 140–400)
RBC: 5.19 10*6/uL (ref 4.20–5.80)
RDW: 12.4 % (ref 11.0–15.0)
Total Lymphocyte: 20.2 %
WBC mixed population: 744 cells/uL (ref 200–950)
WBC: 6 10*3/uL (ref 3.8–10.8)

## 2018-07-31 LAB — LIPID PANEL
Cholesterol: 135 mg/dL (ref <200)
HDL: 39 mg/dL — ABNORMAL LOW (ref >40)
LDL Cholesterol (Calc): 74 mg/dL (calc)
Non-HDL Cholesterol (Calc): 96 mg/dL (calc) (ref <130)
Total CHOL/HDL Ratio: 3.5 (calc) (ref <5.0)
Triglycerides: 134 mg/dL (ref <150)

## 2018-07-31 LAB — VITAMIN B12: Vitamin B-12: 445 pg/mL (ref 200–1100)

## 2018-07-31 LAB — TSH: TSH: 3.52 mIU/L (ref 0.40–4.50)

## 2018-07-31 LAB — INSULIN, RANDOM: Insulin: 2.9 u[IU]/mL (ref 2.0–19.6)

## 2018-07-31 LAB — TESTOSTERONE: Testosterone: 510 ng/dL (ref 250–827)

## 2018-07-31 LAB — MAGNESIUM: Magnesium: 2 mg/dL (ref 1.5–2.5)

## 2018-07-31 LAB — IRON, TOTAL/TOTAL IRON BINDING CAP
%SAT: 24 % (calc) (ref 20–48)
Iron: 70 ug/dL (ref 50–180)
TIBC: 293 mcg/dL (calc) (ref 250–425)

## 2018-07-31 LAB — HEMOGLOBIN A1C
Hgb A1c MFr Bld: 5.1 % of total Hgb (ref <5.7)
Mean Plasma Glucose: 100 (calc)
eAG (mmol/L): 5.5 (calc)

## 2018-07-31 LAB — PSA: PSA: 0.9 ng/mL (ref <=4.0)

## 2018-07-31 LAB — MICROALBUMIN / CREATININE URINE RATIO
Creatinine, Urine: 21 mg/dL (ref 20–320)
Microalb, Ur: <0.2 mg/dL

## 2018-07-31 LAB — VITAMIN D 25 HYDROXY (VIT D DEFICIENCY, FRACTURES): Vit D, 25-Hydroxy: 49 ng/mL (ref 30–100)

## 2018-08-01 LAB — TB SKIN TEST
Induration: 0 mm
TB Skin Test: NEGATIVE

## 2018-08-02 ENCOUNTER — Encounter: Payer: Self-pay | Admitting: Internal Medicine

## 2018-08-10 ENCOUNTER — Other Ambulatory Visit: Payer: Self-pay | Admitting: Internal Medicine

## 2018-08-10 DIAGNOSIS — R350 Frequency of micturition: Secondary | ICD-10-CM

## 2018-08-10 DIAGNOSIS — N400 Enlarged prostate without lower urinary tract symptoms: Secondary | ICD-10-CM

## 2018-08-10 DIAGNOSIS — R109 Unspecified abdominal pain: Secondary | ICD-10-CM

## 2018-08-13 ENCOUNTER — Ambulatory Visit (INDEPENDENT_AMBULATORY_CARE_PROVIDER_SITE_OTHER): Payer: 59 | Admitting: Internal Medicine

## 2018-08-13 ENCOUNTER — Encounter: Payer: Self-pay | Admitting: Internal Medicine

## 2018-08-13 VITALS — BP 108/76 | HR 72 | Temp 97.4°F | Ht 70.0 in | Wt 168.4 lb

## 2018-08-13 DIAGNOSIS — J0410 Acute tracheitis without obstruction: Secondary | ICD-10-CM | POA: Diagnosis not present

## 2018-08-13 DIAGNOSIS — H6593 Unspecified nonsuppurative otitis media, bilateral: Secondary | ICD-10-CM | POA: Diagnosis not present

## 2018-08-13 MED ORDER — MONTELUKAST SODIUM 10 MG PO TABS: ORAL_TABLET | ORAL | 1 refills | Status: DC

## 2018-08-13 MED ORDER — PREDNISONE 20 MG PO TABS: ORAL_TABLET | ORAL | 0 refills | Status: DC

## 2018-08-13 NOTE — Progress Notes (Signed)
  Subjective:    Patient ID: Jay Brooks, male    DOB: May 27, 1961, 57 y.o.   MRN: [id number]  HPI    This very nice 57 yo MWM presents with c/o dry cough and clear.  post nasal sinus drainage. Denies fever, chills, sweats. No sneezing or itchy , watery eyes & nose. Has hx of chronic allergies.   Medication Sig  . aspirin 81 MG tablet Take 81 mg by mouth daily.  Marland Kitchen atenolol  50 MG tablet Take 1 to 2 tablets / daily as directed for BP  . atovaquone-proguanil (MALARONE) 250-100 MG  Take 1 tablet by mouth daily. As needed for work  . TUMS  500 MG Chew 1 tablet by mouth daily.  Marland Kitchen CLEOCIN T   1 % lotion   . NEXIUM 40 MG capsule Take 1 capsule 2 x /day for Acid Indigestion & Heartburn  . PROBIOTIC DAILY Take 1 capsule by mouth daily.  . tamsulosin  0.4 MG CAPS  Take 0.4 mg by mouth daily.  Marland Kitchen testosterone cypio 200 MG/ML injec INJECT 2ML IM EVERY 2 WEEKS  . NASACORT  nasal inhaler Place 2 sprays into the nose daily.  Enid Cutter HFA inhaler   . Ranitidine 150 MG tablet Take 1 tablet (150 mg total) by mouth 2 (two) times daily.   Allergies  Allergen Reactions  . Allegra [Fexofenadine]   . Doxycycline Nausea Only  . Levaquin [Levofloxacin] Nausea And Vomiting    Patient states that after he took levaquin this past time he had nausea with vomiting  . Other     Benzol Peroxide- caused blisters   Past Medical History:  Diagnosis Date  . Allergy   . Chronic sinusitis   . Diverticulosis of colon (without mention of hemorrhage)   . GERD (gastroesophageal reflux disease)   . Lyme disease   . Sleep deficient    Review of Systems   10 point systems review negative except as above.    Objective:   Physical Exam  BP 108/76   Pulse 72   Temp (!) 97.4 F (36.3 C)   Ht 5\' 10"  (1.778 m)   Wt 168 lb 6.4 oz (76.4 kg)   BMI 24.16 kg/m   Dry cough. No stridor  HEENT - WNL. TM's sl retracted. Neck - supple.  Chest - few rales . N rhon Cor - Nl HS. RRR w/o sig MGR. PP 1(+). No edema. MS-  FROM w/o deformities.  Gait Nl. Neuro -  Nl w/o focal abnormalities.    Assessment & Plan:   1. Tracheitis  - predniSONE  20 MG tablet; 1 tab 3 x day for 3 days, then 1 tab 2 x day for 3 days, then 1 tab 1 x day for 5 days  Dispense: 20 tablet; Refill: 0  - montelukast 10 MG tablet; Take 1 tablet daily for Allergies  Dispense: 90 tablet; Refill: 1  2. Bilateral serous otitis media  - predniSONE  20 MG tablet; 1 tab 3 x day for 3 days, then 1 tab 2 x day for 3 days, then 1 tab 1 x day for 5 days  Dispense: 20 tablet; Refill: 0  - montelukast (SINGULAIR) 10 MG tablet; Take 1 tablet daily for Allergies  Dispense: 90 tablet; Refill: 1   - Recc OOW til 08/17/2014

## 2018-08-21 ENCOUNTER — Other Ambulatory Visit: Payer: Self-pay

## 2018-08-21 DIAGNOSIS — Z1211 Encounter for screening for malignant neoplasm of colon: Secondary | ICD-10-CM

## 2018-08-21 DIAGNOSIS — Z1212 Encounter for screening for malignant neoplasm of rectum: Principal | ICD-10-CM

## 2018-08-21 LAB — POC HEMOCCULT BLD/STL (HOME/3-CARD/SCREEN)
Card #2 Fecal Occult Blod, POC: NEGATIVE
Card #3 Fecal Occult Blood, POC: NEGATIVE
Fecal Occult Blood, POC: NEGATIVE

## 2018-08-29 ENCOUNTER — Ambulatory Visit (INDEPENDENT_AMBULATORY_CARE_PROVIDER_SITE_OTHER): Payer: 59 | Admitting: Internal Medicine

## 2018-08-29 VITALS — BP 122/82 | HR 60 | Temp 97.5°F | Resp 16 | Ht 70.0 in | Wt 168.6 lb

## 2018-08-29 DIAGNOSIS — J0410 Acute tracheitis without obstruction: Secondary | ICD-10-CM

## 2018-08-29 DIAGNOSIS — G479 Sleep disorder, unspecified: Secondary | ICD-10-CM

## 2018-08-29 DIAGNOSIS — E349 Endocrine disorder, unspecified: Secondary | ICD-10-CM | POA: Diagnosis not present

## 2018-08-29 MED ORDER — PREDNISONE 20 MG PO TABS: ORAL_TABLET | ORAL | 0 refills | Status: DC

## 2018-08-29 MED ORDER — TESTOSTERONE CYPIONATE 200 MG/ML IM SOLN
400.0000 mg | Freq: Once | INTRAMUSCULAR | Status: AC
  Administered 2018-08-29: 400 mg via INTRAMUSCULAR

## 2018-08-29 MED ORDER — BENZONATATE 200 MG PO CAPS: ORAL_CAPSULE | ORAL | 1 refills | Status: DC

## 2018-08-29 NOTE — Progress Notes (Signed)
Subjective:    Patient ID: Jay Brooks, male    DOB: February 27, 1961, 57 y.o.   MRN: [id number]  HPI    This very nice 57 yo MWM with hx/o seasonal allergies was treated 10/7 for a tracheitis & Otitis media and was OOW 10/7-10/08/2018. He reports sx's resolved and then over the last several days has developed a dry non-productive cough. Denies fever, chill, rash, dyspnea. No sneezing or wheezing.      Patient also has hx/o chronic poor sleep hygiene and had previous evaluation by Dr Rae Lips at [facility] circa 2005-2006 and then he referred to Dr Keturah Barre. Patient relates a more recent sleep evaluation at the [facility] and has been told that her does not have sleep apnea or RLS. 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  Outpatient Medications Prior to Visit  Medication Sig Dispense Refill  . aspirin 81 MG tablet Take 81 mg by mouth daily.    Marland Kitchen atenolol (TENORMIN) 50 MG tablet Take 1 to 2 tablets / daily as directed for BP 90 tablet 1  . atovaquone-proguanil (MALARONE) 250-100 MG TABS Take 1 tablet by mouth daily. As needed for work    . calcium carbonate (TUMS - DOSED IN MG ELEMENTAL CALCIUM) 500 MG chewable tablet Chew 1 tablet by mouth daily.    . clindamycin (CLEOCIN T) 1 % lotion     . esomeprazole (NEXIUM) 40 MG capsule Take 1 capsule 2 x /day for Acid Indigestion & Heartburn 180 capsule 1  . montelukast (SINGULAIR) 10 MG tablet Take 1 tablet daily for Allergies 90 tablet 1  . Probiotic Product (PROBIOTIC DAILY PO) Take 1 capsule by mouth daily.    . tamsulosin (FLOMAX) 0.4 MG CAPS capsule Take 0.4 mg by mouth daily.    . tamsulosin (FLOMAX) 0.4 MG CAPS capsule TAKE 1 CAPSULE (0.4 MG TOTAL) BY MOUTH DAILY AFTER SUPPER. 90 capsule 1  . testosterone cypionate (DEPOTESTOSTERONE  CYPIONATE) 200 MG/ML injection INJECT 2ML INTO THE MUSCLE EVERY 2 WEEKS 10 mL 2  . triamcinolone (NASACORT ALLERGY 24HR) 55 MCG/ACT AERO nasal inhaler Place 2 sprays into the nose daily.    . VENTOLIN HFA 108 (90 Base) MCG/ACT inhaler     . predniSONE (DELTASONE) 20 MG tablet 1 tab 3 x day for 3 days, then 1 tab 2 x day for 3 days, then 1 tab 1 x day for 5 days 20 tablet 0   No facility-administered medications prior to visit.    Allergies  Allergen Reactions  . Allegra [Fexofenadine]   . Doxycycline Nausea Only  . Levaquin [Levofloxacin] Nausea And Vomiting    Patient states that after he took levaquin this past time he had nausea with vomiting  . Other     Benzol Peroxide- caused blisters   Past Medical History:  Diagnosis Date  . Allergy   . Chronic sinusitis   . Diverticulosis of colon (without mention of hemorrhage)   . GERD (gastroesophageal reflux disease)   . Lyme disease   . Sleep deficient    Past Surgical History:  Procedure Laterality Date  . COLONOSCOPY    . HERNIA REPAIR    . UPPER GASTROINTESTINAL ENDOSCOPY    . VASECTOMY     Review of Systems  10 point systems review negative except as above.    Objective:   Physical Exam  BP 122/82   Pulse 60   Temp (!) 97.5 F (36.4 C)   Resp 16   Ht 5\' 10"  (1.778 m)   Wt 168 lb 9.6 oz (76.5 kg)   BMI 24.19 kg/m  O2 sat = 98% on Rm air.   Dry cough. No respiratory distress or stridor.   HEENT - WNL. TM's Nl.  Neck - supple.  Chest - Clear equal BS. No wheezes Cor - Nl HS. RRR w/o sig MGR. PP 1(+). No edema. MS- FROM w/o deformities.  Gait Nl. Neuro -  Nl w/o focal abnormalities. Skin- clear w/o rash, icterus or cyanosis.     Assessment & Plan:   1. Tracheitis - probably allergic  - predniSONE  20 MG tablet; 1 tab 3 x day for 2 days, then 1 tab 2 x day for 2 days, then 1 tab 1 x day for 3 days  Dispense: 13 tablet  - benzonatate  200 MG capsule; Take 1 perle 3 x / day to prevent cough  Dispense: 30  capsule; Refill: 1  - Sx's- Asmanex 100 #120 dose - recc 2 inhalations  2 x / day - w/ spacer  - Note at his request to be OOW 10/23 - 10/26  2. Testosterone deficiency  - testosterone cypionate (DEPOTESTOSTERONE CYPIONATE) injection 400 mg  3. Sleep disorder  - Ambulatory referral to Sleep Evaluation

## 2018-08-29 NOTE — Patient Instructions (Signed)

## 2018-08-30 ENCOUNTER — Encounter: Payer: Self-pay | Admitting: Internal Medicine

## 2018-09-05 DIAGNOSIS — J301 Allergic rhinitis due to pollen: Secondary | ICD-10-CM | POA: Diagnosis not present

## 2018-09-05 DIAGNOSIS — R05 Cough: Secondary | ICD-10-CM | POA: Diagnosis not present

## 2018-09-05 DIAGNOSIS — J3089 Other allergic rhinitis: Secondary | ICD-10-CM | POA: Diagnosis not present

## 2018-09-05 DIAGNOSIS — J4530 Mild persistent asthma, uncomplicated: Secondary | ICD-10-CM | POA: Diagnosis not present

## 2018-09-10 ENCOUNTER — Telehealth: Payer: Self-pay | Admitting: Physician Assistant

## 2018-09-10 NOTE — Telephone Encounter (Signed)
Patient calls with concerns that his "squeaky cough" is related to a GI issue. His symptoms are burping, a sporadic daytime cough which worsens at night and in the morning. He cannot control or suppress it. (please see the IM notes also) He is on Omeprazole or Nexium 40 mg BID and Zantac 300 mg BID. He has been put on prednisone, an inhaler and nasal spray without improvement. GI symptoms are "burping" "gurgling" and  An "empty feeling discomfort" until he has something in his stomach. He is out of work until the issue is resolved.  Any ideas?

## 2018-09-11 NOTE — Telephone Encounter (Signed)
Linna Hoff - forwarding to you - I saw him last year for diverticulitis - looks like primary MD was treating for tracheitis - ay have completed RX - already on BID PPI and BID h2 .Marland Kitchen.? Repeat EGD ?

## 2018-09-11 NOTE — Telephone Encounter (Signed)
Beth Can you make sure the nexium is 20-30 min before breakfast and dinner meals.  Change him to pecid (famotidine) 40mg , twice daily (first dose after breakfast and second dose at bedtime).  Limit caffeine intake to 1 bev per day.  Limit etoh intake to 1 bev per day.  Caffeine should be in AM hours only.  No etoh within 2-3 hours of bedtime. Do not lay down within 2-3 hours of eating any meals.  EGD was a year ago, doubt much has changed and so would not repeat just yet.  He needs to call back to report on his response to the regimen above in 7-10 days.  thanks

## 2018-09-12 ENCOUNTER — Other Ambulatory Visit: Payer: Self-pay

## 2018-09-12 DIAGNOSIS — K219 Gastro-esophageal reflux disease without esophagitis: Secondary | ICD-10-CM

## 2018-09-12 MED ORDER — FAMOTIDINE 40 MG PO TABS: ORAL_TABLET | ORAL | 3 refills | Status: DC

## 2018-09-12 NOTE — Telephone Encounter (Signed)
Discussed in detail with the patient.  Prescription for Pepcid to [facility].  Instructions also emailed to the patient.

## 2018-09-17 ENCOUNTER — Encounter

## 2018-09-17 ENCOUNTER — Ambulatory Visit: Payer: 59 | Admitting: Physician Assistant

## 2018-09-17 NOTE — Progress Notes (Signed)
Subjective:    Patient ID: Jay Brooks, male    DOB: 09-12-1961, 57 y.o.   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 Saw allergist, Dr Jerilynn Mages.Orvil Feil about 2 weeks ago w/ Allergy tests about 2 weeks ago & told allergic to grass & pollens & should get better after a couple of good freezes. She rx'd an albuterol inhaler and advised him to stop his Singulair. She did not recommend allergy shots. Also, he communicated with Dr Ardis Hughs' office (GI) & with suspicion of laryngeal reflux causing cough, he was Rx'd Nexium 40 mg & Pepcid 40 mg - both bid. 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 He felt Tessalon perles didn't help and he had some type of dysphoria with Allegra. He is not aware of wheezing.      Patient has hx/o chronic insomnia and declined re-evaluation by Dr Brett Fairy.  Outpatient Medications Prior to Visit  Medication Sig  . aspirin 81 MG  Take 81 mg by mouth daily.  Marland Kitchen atenolol 50 MG Take 1 to 2 tablets / daily as directed for BP  . MALARONE 250-100 MG  Take 1 tablet by mouth daily. As needed for work  . TUMS 500 MG  Chew 1 tablet by mouth daily.  Marland Kitchen CLEOCIN T 1 % lotion   . NEXIUM 40 MG  Take 1 capsule 2 x /day for Acid Indigestion & Heartburn  . PEPCID 40 MG tablet 1 tablet after breakfast and at bedtime  . SINGULAIR 10 MG  Take 1 tablet daily for Allergies  . Probiotic  Take 1 capsule by mouth daily.  Marland Kitchen FLOMAX 0.4 MG CAPS  TAKE 1 CAPSULE DAILY  . testosterone cypio 200 MG/ML injec INJECT 2ML IM EVERY 2 WKS  . NASACORT   nasal inhaler Place 2 sprays into the nose daily.  Enid Cutter HFA inhaler    Allergies  Allergen Reactions  . Allegra [Fexofenadine]   . Doxycycline Nausea Only  . Levaquin [Levofloxacin] Nausea And Vomiting    Patient states that after he took levaquin this past time he had nausea with vomiting  . Other     Benzol Peroxide- caused blisters   Past Medical History:  Diagnosis Date  . Allergy   . Chronic sinusitis   . Diverticulosis of colon (without mention of hemorrhage)   . GERD (gastroesophageal reflux disease)   . Lyme disease   . Sleep deficient    Past Surgical History:  Procedure Laterality Date  . COLONOSCOPY    . HERNIA REPAIR    . UPPER GASTROINTESTINAL ENDOSCOPY    . VASECTOMY     Review of Systems    10 point systems review negative except as above.    Objective:   Physical Exam  BP 136/80   Pulse 64   Temp (!) 97.3 F (36.3 C)   Ht 5\' 10"  (1.778 m)   Wt 173 lb (78.5 kg)   SpO2 98%   BMI 24.82 kg/m   In no distress. No stridor/wheezing evident . Clears throat several times during encounter.  HEENT - WNL. O/P - clear - no sign of infection. Neck - supple. No palpable Cx LN's.  Chest - Clear equal BS. No rales , rhonchi or wheezes.  Cor - Nl HS. RRR w/o sig MGR. PP 1(+). No  edema. MS- FROM w/o deformities.  Gait Nl. Neuro -  Nl w/o focal abnormalities. Skin - No rash , lesions, cyanosis, icterus or hives.     Assessment & Plan:   1. Cough, by symptom history  - CBC with Differential/Platelet  2. Reflux laryngitis   3. Pharyngitis, unspecified etiology  - advised patient that I do not perceive a cough significant enough to be disabling that he couldn't work & he quickly stated that "yes, a cough could be disabling for a pilot", but I did not observe severe cough or difficulty with phonation during today's encounter. I did recommend that he re-start his Singulair and if his sx's persisted that a Pulmonary consultation would be in order.

## 2018-09-18 ENCOUNTER — Encounter: Payer: Self-pay | Admitting: Internal Medicine

## 2018-09-18 ENCOUNTER — Ambulatory Visit (INDEPENDENT_AMBULATORY_CARE_PROVIDER_SITE_OTHER): Payer: 59 | Admitting: Internal Medicine

## 2018-09-18 VITALS — BP 136/80 | HR 64 | Temp 97.3°F | Ht 70.0 in | Wt 173.0 lb

## 2018-09-18 DIAGNOSIS — J029 Acute pharyngitis, unspecified: Secondary | ICD-10-CM | POA: Diagnosis not present

## 2018-09-18 DIAGNOSIS — J040 Acute laryngitis: Secondary | ICD-10-CM

## 2018-09-18 DIAGNOSIS — R05 Cough: Secondary | ICD-10-CM

## 2018-09-18 DIAGNOSIS — K219 Gastro-esophageal reflux disease without esophagitis: Secondary | ICD-10-CM

## 2018-09-18 DIAGNOSIS — R059 Cough, unspecified: Secondary | ICD-10-CM

## 2018-09-18 LAB — CBC WITH DIFFERENTIAL/PLATELET
Basophils Absolute: 22 cells/uL (ref 0–200)
Basophils Relative: 0.4 %
Eosinophils Absolute: 72 cells/uL (ref 15–500)
Eosinophils Relative: 1.3 %
HCT: 47.2 % (ref 38.5–50.0)
Hemoglobin: 16.6 g/dL (ref 13.2–17.1)
Lymphs Abs: 1150 cells/uL (ref 850–3900)
MCH: 32.1 pg (ref 27.0–33.0)
MCHC: 35.2 g/dL (ref 32.0–36.0)
MCV: 91.3 fL (ref 80.0–100.0)
MPV: 9.8 fL (ref 7.5–12.5)
Monocytes Relative: 11.6 %
Neutro Abs: 3619 cells/uL (ref 1500–7800)
Neutrophils Relative %: 65.8 %
Platelets: 149 10*3/uL (ref 140–400)
RBC: 5.17 10*6/uL (ref 4.20–5.80)
RDW: 12.2 % (ref 11.0–15.0)
Total Lymphocyte: 20.9 %
WBC mixed population: 638 cells/uL (ref 200–950)
WBC: 5.5 10*3/uL (ref 3.8–10.8)

## 2018-09-18 NOTE — Patient Instructions (Signed)
Cough, Adult  Coughing is a reflex that clears your throat and your airways. Coughing helps to heal and protect your lungs. It is normal to cough occasionally, but a cough that happens with other symptoms or lasts a long time may be a sign of a condition that needs treatment. A cough may last only 2-3 weeks (acute), or it may last longer than 8 weeks (chronic).  What are the causes?  Coughing is commonly caused by:   Breathing in substances that irritate your lungs.   A viral or bacterial respiratory infection.   Allergies.   Asthma.   Postnasal drip.   Smoking.   Acid backing up from the stomach into the esophagus (gastroesophageal reflux).   Certain medicines.   Chronic lung problems, including COPD (or rarely, lung cancer).   Other medical conditions such as heart failure.    Follow these instructions at home:  Pay attention to any changes in your symptoms. Take these actions to help with your discomfort:   Take medicines only as told by your health care provider.  ? If you were prescribed an antibiotic medicine, take it as told by your health care provider. Do not stop taking the antibiotic even if you start to feel better.  ? Talk with your health care provider before you take a cough suppressant medicine.   Drink enough fluid to keep your urine clear or pale yellow.   If the air is dry, use a cold steam vaporizer or humidifier in your bedroom or your home to help loosen secretions.   Avoid anything that causes you to cough at work or at home.   If your cough is worse at night, try sleeping in a semi-upright position.   Avoid cigarette smoke. If you smoke, quit smoking. If you need help quitting, ask your health care provider.   Avoid caffeine.   Avoid alcohol.   Rest as needed.    Contact a health care provider if:   You have new symptoms.   You cough up pus.   Your cough does not get better after 2-3 weeks, or your cough gets worse.   You cannot control your cough with suppressant  medicines and you are losing sleep.   You develop pain that is getting worse or pain that is not controlled with pain medicines.   You have a fever.   You have unexplained weight loss.   You have night sweats.  Get help right away if:   You cough up blood.   You have difficulty breathing.   Your heartbeat is very fast.  This information is not intended to replace advice given to you by your health care provider. Make sure you discuss any questions you have with your health care provider.  Document Released: 04/22/2011 Document Revised: 03/31/2016 Document Reviewed: 12/31/2014  Elsevier Interactive Patient Education  2018 [employer]

## 2018-09-24 ENCOUNTER — Other Ambulatory Visit (INDEPENDENT_AMBULATORY_CARE_PROVIDER_SITE_OTHER): Payer: Self-pay | Admitting: Otolaryngology

## 2018-09-24 ENCOUNTER — Ambulatory Visit (INDEPENDENT_AMBULATORY_CARE_PROVIDER_SITE_OTHER): Payer: 59 | Admitting: Otolaryngology

## 2018-09-24 ENCOUNTER — Ambulatory Visit
Admission: RE | Admit: 2018-09-24 | Discharge: 2018-09-24 | Disposition: A | Discharge status: Home / Self Care | Payer: 59 | Source: Ambulatory Visit | Attending: Otolaryngology | Admitting: Otolaryngology

## 2018-09-24 DIAGNOSIS — R05 Cough: Secondary | ICD-10-CM

## 2018-09-24 DIAGNOSIS — R059 Cough, unspecified: Secondary | ICD-10-CM

## 2018-09-24 DIAGNOSIS — R070 Pain in throat: Secondary | ICD-10-CM | POA: Diagnosis not present

## 2018-09-26 ENCOUNTER — Telehealth: Payer: Self-pay | Admitting: Physician Assistant

## 2018-09-27 NOTE — Telephone Encounter (Signed)
Patient does have a follow up appointment with Ellouise Newer, PA. Dr Ardis Hughs does not have follow up appointment openings until January.

## 2018-10-07 DIAGNOSIS — R918 Other nonspecific abnormal finding of lung field: Secondary | ICD-10-CM

## 2018-10-07 HISTORY — DX: Other nonspecific abnormal finding of lung field: R91.8

## 2018-10-09 DIAGNOSIS — R05 Cough: Secondary | ICD-10-CM | POA: Diagnosis not present

## 2018-10-09 DIAGNOSIS — K219 Gastro-esophageal reflux disease without esophagitis: Secondary | ICD-10-CM | POA: Diagnosis not present

## 2018-10-11 ENCOUNTER — Other Ambulatory Visit: Payer: Self-pay | Admitting: Internal Medicine

## 2018-10-11 DIAGNOSIS — K219 Gastro-esophageal reflux disease without esophagitis: Secondary | ICD-10-CM

## 2018-10-11 DIAGNOSIS — R05 Cough: Secondary | ICD-10-CM

## 2018-10-11 DIAGNOSIS — J040 Acute laryngitis: Secondary | ICD-10-CM

## 2018-10-11 DIAGNOSIS — R059 Cough, unspecified: Secondary | ICD-10-CM

## 2018-10-11 DIAGNOSIS — R053 Chronic cough: Secondary | ICD-10-CM

## 2018-10-18 ENCOUNTER — Ambulatory Visit (INDEPENDENT_AMBULATORY_CARE_PROVIDER_SITE_OTHER): Payer: 59

## 2018-10-18 VITALS — BP 122/78 | HR 61 | Temp 97.5°F | Ht 70.0 in | Wt 169.0 lb

## 2018-10-18 DIAGNOSIS — E291 Testicular hypofunction: Secondary | ICD-10-CM

## 2018-10-18 MED ORDER — TESTOSTERONE CYPIONATE 200 MG/ML IM SOLN
200.0000 mg | Freq: Once | INTRAMUSCULAR | Status: AC
  Administered 2018-10-18: 200 mg via INTRAMUSCULAR

## 2018-10-18 NOTE — Progress Notes (Signed)
Patient here for a NV for his Testosterone Cypionate 200 mg/ml 2 ml IMRIGHTupper outer quadrant injection. Patient tolerated well and will return in 3 weeks for his next injection 

## 2018-10-19 ENCOUNTER — Encounter: Payer: Self-pay | Admitting: Physician Assistant

## 2018-10-19 ENCOUNTER — Ambulatory Visit (INDEPENDENT_AMBULATORY_CARE_PROVIDER_SITE_OTHER): Payer: 59 | Admitting: Physician Assistant

## 2018-10-19 VITALS — BP 100/70 | HR 66 | Ht 69.0 in | Wt 169.6 lb

## 2018-10-19 DIAGNOSIS — R059 Cough, unspecified: Secondary | ICD-10-CM

## 2018-10-19 DIAGNOSIS — K219 Gastro-esophageal reflux disease without esophagitis: Secondary | ICD-10-CM | POA: Diagnosis not present

## 2018-10-19 DIAGNOSIS — R05 Cough: Secondary | ICD-10-CM | POA: Diagnosis not present

## 2018-10-19 DIAGNOSIS — K649 Unspecified hemorrhoids: Secondary | ICD-10-CM

## 2018-10-19 IMAGING — CT CT ABD-PELV W/ CM
1 of 3 series · 14 of 32 positions shown, 19 images · IV contrast (APPLIED)
Comparison: 03/21/2016

CLINICAL DATA: Left lower quadrant pain chronically. Nausea and
vomiting.

EXAM:
CT ABDOMEN AND PELVIS WITH CONTRAST
TECHNIQUE: Multidetector CT imaging of the abdomen and pelvis was performed
using the standard protocol following bolus administration of
intravenous contrast.
CONTRAST:  100mL HPIPNW-Y22 IOPAMIDOL (HPIPNW-Y22) INJECTION 61%

[Series 2: abd/pelvis w/cm · axial · 0.70mm/px · z∈[-407,-22]mm · 14 of 87 slices shown, 19 images]
[im 5/87  soft-tissue]
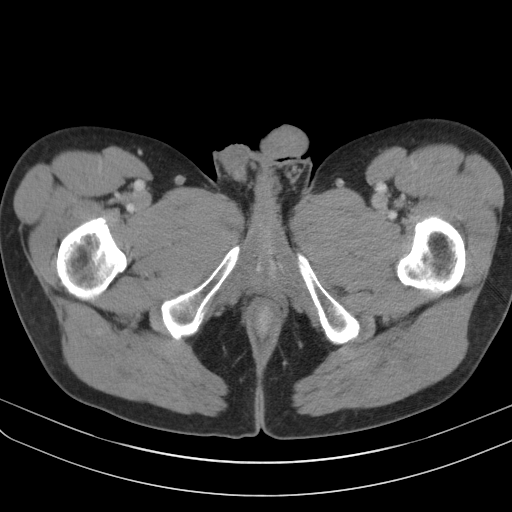
[im 5/87  bone]
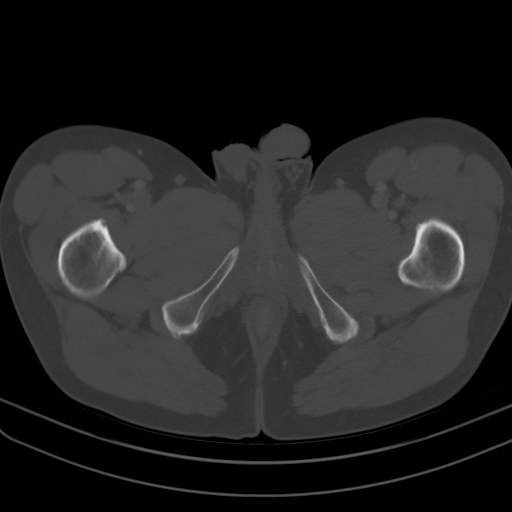
[im 14/87  soft-tissue]
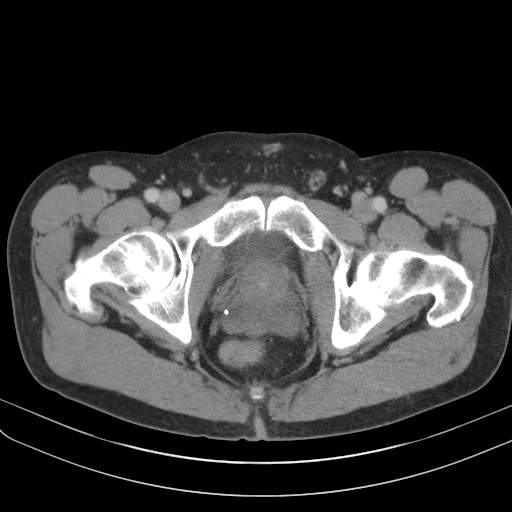
[im 19/87  soft-tissue]
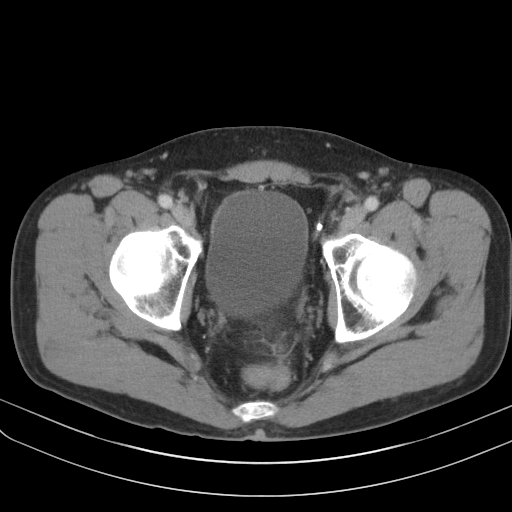
[im 23/87  soft-tissue]
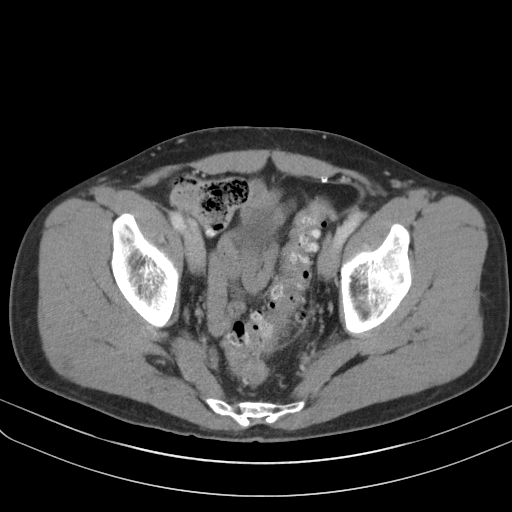
[im 32/87  soft-tissue]
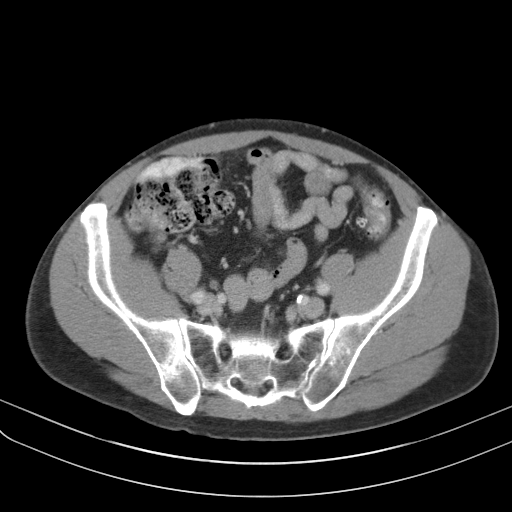
[im 37/87  soft-tissue]
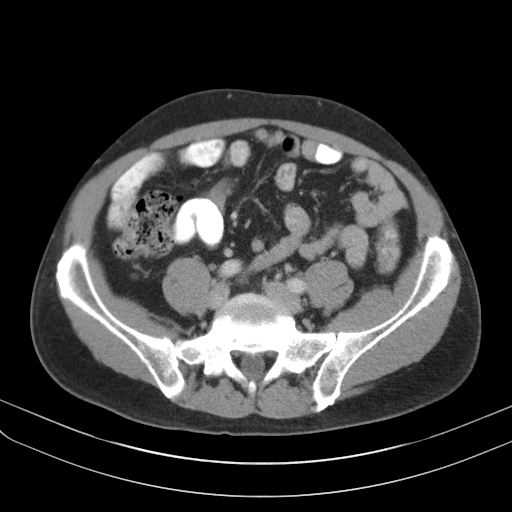
[im 46/87  soft-tissue]
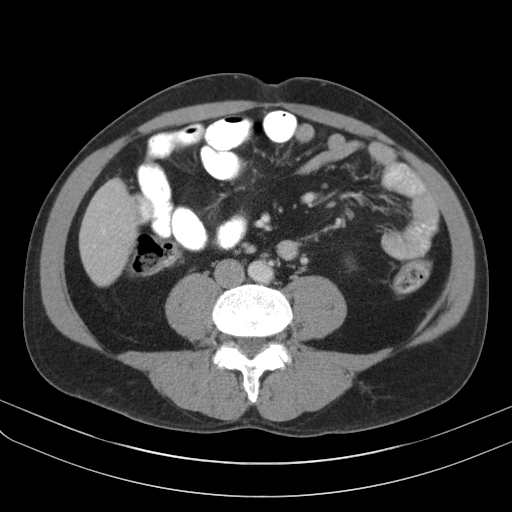
[im 50/87  soft-tissue]
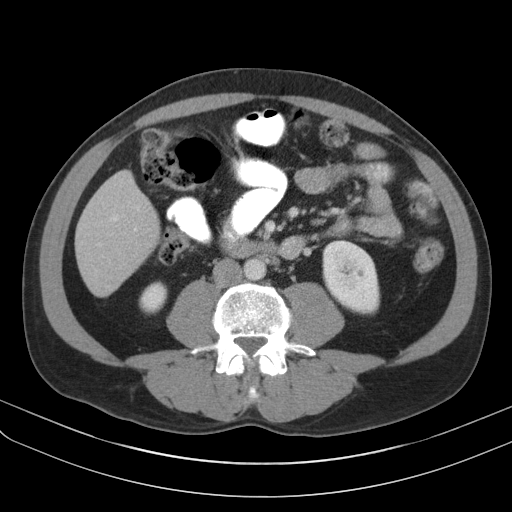
[im 55/87  soft-tissue]
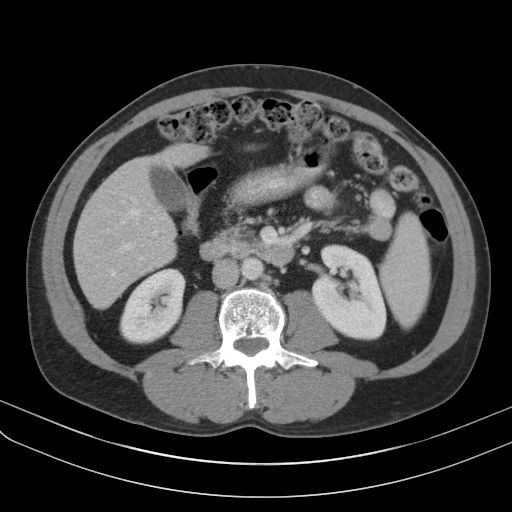
[im 55/87  bone]
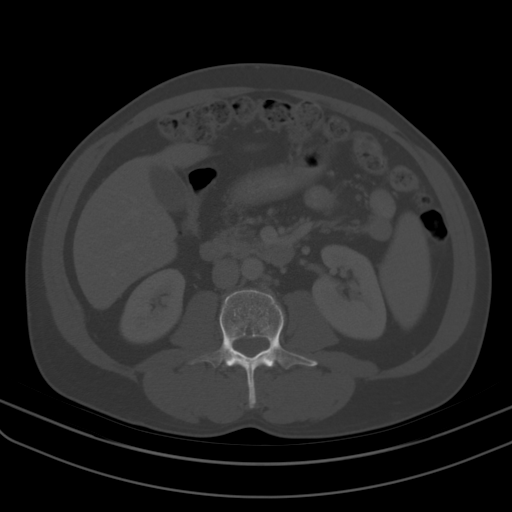
[im 64/87  soft-tissue]
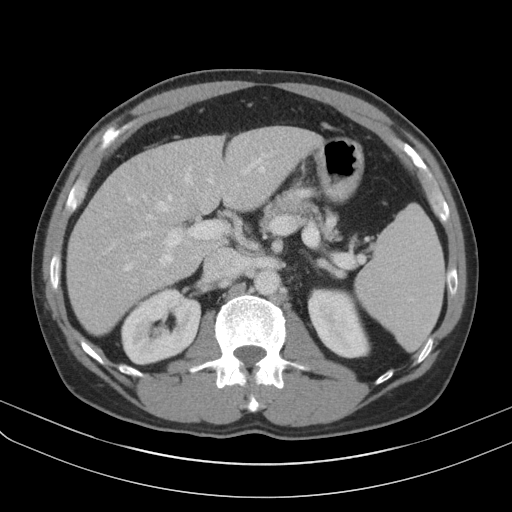
[im 68/87  soft-tissue]
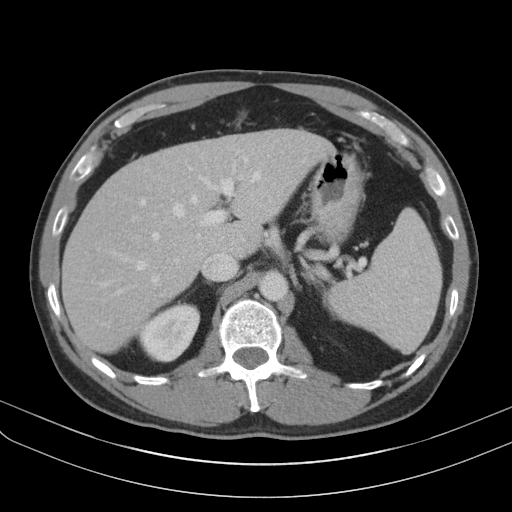
[im 68/87  lung]
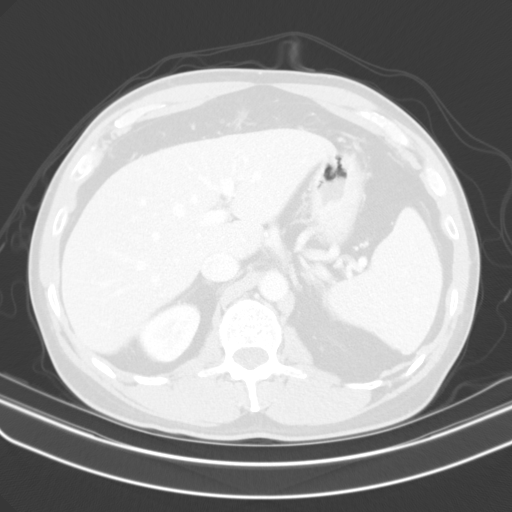
[im 73/87  soft-tissue]
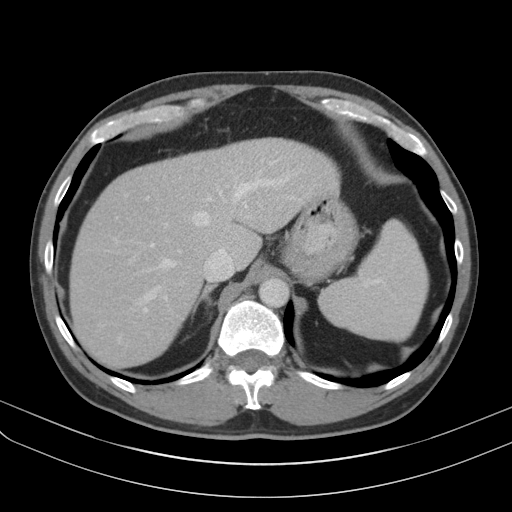
[im 73/87  lung]
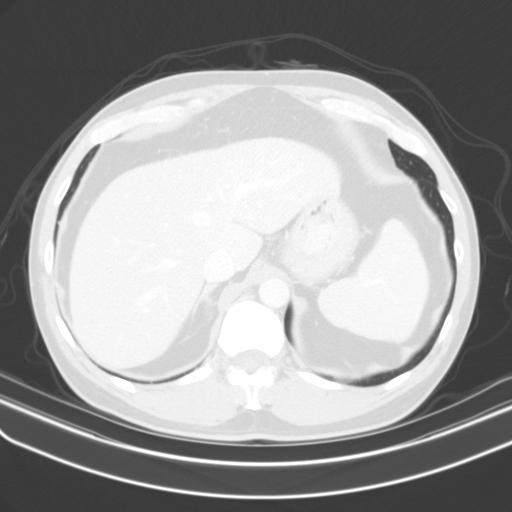
[im 77/87  lung]
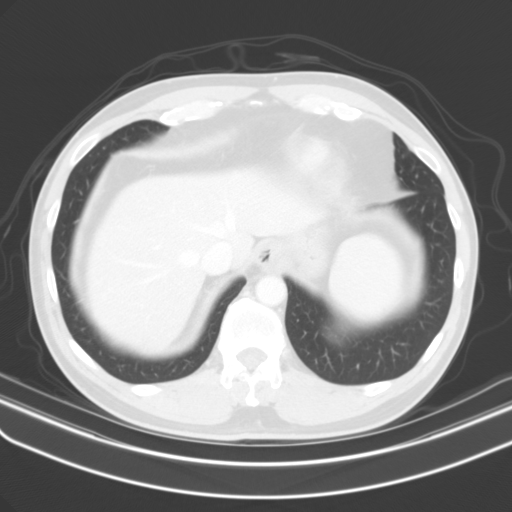
[im 82/87  soft-tissue]
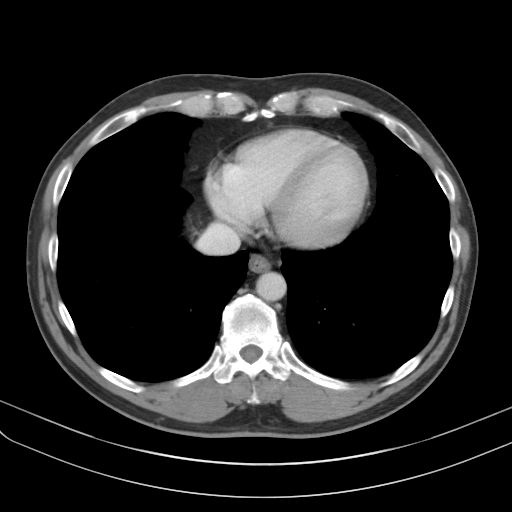
[im 82/87  lung]
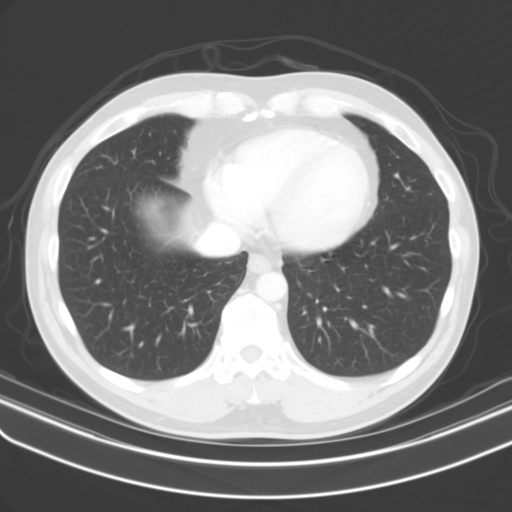

[14 of 32 positions shown; findings below may reference images not displayed]

FINDINGS: Lower chest: Normal.

Hepatobiliary: Normal.

Pancreas: Normal.

Spleen: Normal.

Adrenals/Urinary Tract: Adrenal glands are normal. Kidneys are
normal in size without hydronephrosis or nephrolithiasis. Ureters
and bladder are normal.

Stomach/Bowel: Stomach and small bowel are normal. Appendix is
normal. Diverticulosis of the colon most prominent over the distal
descending and sigmoid colon. There is an inflamed diverticulum over
the left posterior aspect of the rectosigmoid junction in the left
posterior pelvis with mild adjacent inflammatory change. No
perforation or diverticular abscess.

Vascular/Lymphatic: Within normal.

Reproductive: Within normal.

Other: Evidence of previous hernia repair over the left lower
abdomen/inguinal region. No significant free peritoneal fluid.

Musculoskeletal: Within normal.
IMPRESSION: Diverticulosis of the colon with mild acute diverticulitis involving
the rectosigmoid junction in the left posterior pelvis. No
perforation or abscess.

Previous hernia repair of the left lower abdominal wall/inguinal
region.

## 2018-10-19 NOTE — Patient Instructions (Addendum)
If you are age 59 or older, your body mass index should be between 23-30. Your Body mass index is 25.05 kg/m. If this is out of the aforementioned range listed, please consider follow up with your Primary Care Provider.  If you are age 50 or younger, your body mass index should be between 19-25. Your Body mass index is 25.05 kg/m. If this is out of the aformentioned range listed, please consider follow up with your Primary Care Provider.   Continue Nexium  40 mg twice daily.  Continue Pepcid 40 mg twice daily.  Call if Pulmonary says you are okay from their standpoint.  Follow up with Dr. Ardis Hughs as needed.  Thank you for choosing me and [facility].   Ellouise Newer, PA-C

## 2018-10-19 NOTE — Progress Notes (Signed)
I agree with the above note, plan 

## 2018-10-19 NOTE — Progress Notes (Signed)
Chief Complaint: Reflux and cough  HPI:    Mr. Purtee is a 57 year old male with a past medical history as listed below, known to Dr. Ardis Hughs, who was referred to me by Unk Pinto, MD for a complaint of reflux and cough.      09/08/2017 EGD with Dr. Ardis Hughs with mild nonspecific gastritis and otherwise normal.  Pathology showed mild chronic active gastritis negative for H. pylori.    09/10/2018 patient called describing a "squeaky cough" and reflux.  He was on Omeprazole or Nexium 40 mg twice daily and Zantac 300 mg twice daily.  He had recently been put on Prednisone and inhaler nasal spray without improvement.  GI symptoms included burping, gurgling and an empty feeling with discomfort.  At that time it was recommended he take his Nexium 20-30 minutes for breakfast and dinner.  He was changed to Pepcid 40 mg twice daily first dose after breakfast and second dose at bedtime.  He was given antireflux measures.  It was discussed that EGD was a year ago and it was doubted that much change would have occurred, and would not repeat at that time.    09/18/2018 patient saw his primary care physician.  It was discussed that he did not think his cough was significant enough to be disabling that he could not work but patient disagreed.  He was restarted on his Singulair and it was discussed that if his symptoms persisted he should see pulmonary.    09/21/2018 patient discussed that he saw an ENT who did a laryngoscopy and describes some slight irritation, believed related to reflux as well.  He also had a chest x-ray with lungs that were clear.    10/11/2018 patient requested to see a pulmonologist.  He was referred by his PCP.    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  He has had all the work-up as above.  He tells me that as of a week  ago his cough has now become somewhat productive.  He has also started with some stuffy nose over the past couple of nights.  Tells me he is currently taking his Nexium 40 mg twice a day 30-60 minutes before breakfast and dinner and using his Pepcid 40 mg after breakfast and before bedtime.  He has seen little to no improvement being on max therapy of this over the past 4 to 6 weeks.  Symptoms are slightly increased when he has an empty stomach.      Also recently diagnosed with oral thrush and placed on a mouthwash which he is swishing and spitting.  He has been on this for the past 10 days and does describe some associated throat discomfort with this, which is getting slowly better.    Also discusses hemorrhoids which are itchy to him.  He has been using once daily Preparation H for the past few years and this does seem to relieve his symptoms.  He has questions about possible banding today.    Patient has consultation with pulmonology on 10/24/2018.    Denies fever, chills, weight loss, anorexia, nausea, vomiting or symptoms that awaken him from sleep.  Past Medical History:  Diagnosis Date  . Allergy   . Chronic sinusitis   . Diverticulosis of colon (without mention of hemorrhage)   . GERD (  gastroesophageal reflux disease)   . Lyme disease   . Sleep deficient     Past Surgical History:  Procedure Laterality Date  . COLONOSCOPY    . HERNIA REPAIR    . UPPER GASTROINTESTINAL ENDOSCOPY    . VASECTOMY      Current Outpatient Medications  Medication Sig Dispense Refill  . aspirin 81 MG tablet Take 81 mg by mouth daily.    Marland Kitchen atenolol (TENORMIN) 50 MG tablet Take 1 to 2 tablets / daily as directed for BP 90 tablet 1  . atovaquone-proguanil (MALARONE) 250-100 MG TABS Take 1 tablet by mouth daily. As needed for work    . calcium carbonate (TUMS - DOSED IN MG ELEMENTAL CALCIUM) 500 MG chewable tablet Chew 1 tablet by mouth daily.    . clindamycin (CLEOCIN T) 1 % lotion     . esomeprazole  (NEXIUM) 40 MG capsule Take 1 capsule 2 x /day for Acid Indigestion & Heartburn 180 capsule 1  . famotidine (PEPCID) 40 MG tablet 1 tablet after breakfast and at bedtime 60 tablet 3  . montelukast (SINGULAIR) 10 MG tablet Take 1 tablet daily for Allergies 90 tablet 1  . Probiotic Product (PROBIOTIC DAILY PO) Take 1 capsule by mouth daily.    . tamsulosin (FLOMAX) 0.4 MG CAPS capsule TAKE 1 CAPSULE (0.4 MG TOTAL) BY MOUTH DAILY AFTER SUPPER. 90 capsule 1  . testosterone cypionate (DEPOTESTOSTERONE CYPIONATE) 200 MG/ML injection INJECT 2ML INTO THE MUSCLE EVERY 2 WEEKS 10 mL 2  . triamcinolone (NASACORT ALLERGY 24HR) 55 MCG/ACT AERO nasal inhaler Place 2 sprays into the nose daily.    . VENTOLIN HFA 108 (90 Base) MCG/ACT inhaler      No current facility-administered medications for this visit.     Allergies as of 10/19/2018 - Review Complete 10/18/2018  Allergen Reaction Noted  . Allegra [fexofenadine]  10/22/2014  . Doxycycline Nausea Only 12/30/2014  . Levaquin [levofloxacin] Nausea And Vomiting 09/08/2014  . Other  12/06/2012    Family History  Problem Relation Age of Onset  . Breast cancer Maternal Grandmother   . Colon polyps Brother   . Colon polyps Maternal Uncle   . Cancer Mother 57       pancreatic  . Colon cancer Neg Hx   . Esophageal cancer Neg Hx   . Stomach cancer Neg Hx   . Ulcerative colitis Neg Hx     Social History   Socioeconomic History  . Marital status: Married    Spouse name: Not on file  . Number of children: 1  . Years of education: Not on file  . Highest education level: Not on file  Occupational History  . Occupation: PILOT    Employer: [employer]  Social Needs  . Financial resource strain: Not on file  . Food insecurity:    Worry: Not on file    Inability: Not on file  . Transportation needs:    Medical: Not on file    Non-medical: Not on file  Tobacco Use  . Smoking status: Never Smoker  . Smokeless tobacco: Never Used  Substance  and Sexual Activity  . Alcohol use: Yes    Comment: occ  . Drug use: No  . Sexual activity: Not on file  Lifestyle  . Physical activity:    Days per week: Not on file    Minutes per session: Not on file  . Stress: Not on file  Relationships  . Social connections:    Talks on phone:  Not on file    Gets together: Not on file    Attends religious service: Not on file    Active member of club or organization: Not on file    Attends meetings of clubs or organizations: Not on file    Relationship status: Not on file  . Intimate partner violence:    Fear of current or ex partner: Not on file    Emotionally abused: Not on file    Physically abused: Not on file    Forced sexual activity: Not on file  Other Topics Concern  . Not on file  Social History Narrative   Daily caffeine     Review of Systems:    Constitutional: No weight loss, fever or chills Cardiovascular: No chest pain Respiratory: No SOB Gastrointestinal: See HPI and otherwise negative   Physical Exam:  Vital signs: BP 100/70   Pulse 66   Ht 5\' 9"  (1.753 m)   Wt 169 lb 9.6 oz (76.9 kg)   BMI 25.05 kg/m   Constitutional:   Pleasant Caucasian male appears to be in NAD, Well developed, Well nourished, alert and cooperative Head:  Normocephalic and atraumatic. Eyes:   PEERL, EOMI. No icterus. Conjunctiva pink. Ears:  Normal auditory acuity. Neck:  Supple Throat: Oral cavity and pharynx without lesion.  +erythema and white exudate on posterior tongue Respiratory: Respirations even and unlabored. Lungs clear to auscultation bilaterally.   No wheezes, crackles, or rhonchi. +persistent dry cough during exam Cardiovascular: Normal S1, S2. No MRG. Regular rate and rhythm. No peripheral edema, cyanosis or pallor.  Gastrointestinal:  Soft, nondistended, nontender. No rebound or guarding. Normal bowel sounds. No appreciable masses or hepatomegaly. Rectal:  Not performed.  Psychiatric: Demonstrates good judgement and reason  without abnormal affect or behaviors.  RELEVANT LABS AND IMAGING: CBC    Component Value Date/Time   WBC 5.5 09/18/2018 1211   RBC 5.17 09/18/2018 1211   HGB 16.6 09/18/2018 1211   HCT 47.2 09/18/2018 1211   PLT 149 09/18/2018 1211   MCV 91.3 09/18/2018 1211   MCH 32.1 09/18/2018 1211   MCHC 35.2 09/18/2018 1211   RDW 12.2 09/18/2018 1211   LYMPHSABS 1,150 09/18/2018 1211   MONOABS 770 03/10/2016 0925   EOSABS 72 09/18/2018 1211   BASOSABS 22 09/18/2018 1211    CMP     Component Value Date/Time   NA 137 07/30/2018 0945   K 4.6 07/30/2018 0945   CL 101 07/30/2018 0945   CO2 32 07/30/2018 0945   GLUCOSE 83 07/30/2018 0945   BUN 16 07/30/2018 0945   CREATININE 1.14 07/30/2018 0945   CALCIUM 9.6 07/30/2018 0945   PROT 7.1 07/30/2018 0945   ALBUMIN 4.3 03/10/2016 0925   AST 23 07/30/2018 0945   ALT 14 07/30/2018 0945   ALKPHOS 37 (L) 03/10/2016 0925   BILITOT 0.6 07/30/2018 0945   GFRNONAA 70 07/30/2018 0945   GFRAA 81 07/30/2018 0945    Assessment: 1.  Cough: Over the past 2 months, persistent, eval by ENT, does have appointment with pulmonology on 12/18 2.  GERD: Thought to be a component of patient's recent cough, now on max PPI and H2 blocker therapy with continued cough as above 3.  Hemorrhoids: Internal per patient which are relieved by Preparation H cream daily  Plan: 1.  Discussed with patient that if Preparation H is working for him daily then would just continue this.  If his hemorrhoids become worse we can discuss Hydrocortisone/steroid therapy versus banding 2.  Encouraged patient continue his Nexium 40 mg 30-60 minutes before breakfast and dinner 3.  Also continue Pepcid 40 mg after breakfast and before bedtime 4.  Discussed with the patient that due to the fact that he is on max PPI and H2 blocker therapy the chances that his cough are related to reflux are minimal, but if pulmonology clears him next Wednesday then we can proceed with an EGD for further  evaluation.  He will call our clinic and let us know.  If pulmonology clears him would recommend he have an EGD in the [place] with Dr. Ardis Hughs. 5.  Patient to follow in clinic per recommendations after time of pulmonology appointment.  Ellouise Newer, PA-C [facility] 10/19/2018, 9:09 AM  Cc: Unk Pinto, MD

## 2018-10-24 ENCOUNTER — Ambulatory Visit (INDEPENDENT_AMBULATORY_CARE_PROVIDER_SITE_OTHER): Payer: 59 | Admitting: Internal Medicine

## 2018-10-24 ENCOUNTER — Encounter: Payer: Self-pay | Admitting: Internal Medicine

## 2018-10-24 VITALS — BP 124/76 | HR 65 | Ht 69.0 in | Wt 171.4 lb

## 2018-10-24 DIAGNOSIS — R059 Cough, unspecified: Secondary | ICD-10-CM

## 2018-10-24 DIAGNOSIS — R05 Cough: Secondary | ICD-10-CM

## 2018-10-24 DIAGNOSIS — R053 Chronic cough: Secondary | ICD-10-CM

## 2018-10-24 MED ORDER — ACETAMINOPHEN-CODEINE #3 300-30 MG PO TABS: 1.0000 | ORAL_TABLET | ORAL | 0 refills | Status: DC | PRN

## 2018-10-24 MED ORDER — PREDNISONE 10 MG PO TABS: ORAL_TABLET | ORAL | 0 refills | Status: DC

## 2018-10-24 NOTE — Progress Notes (Addendum)
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  Not able to work since Oct 2019    10/24/2018  Pulmonary/ 1st office eval/Evalie Hargraves  Chief Complaint  Patient presents with  . Pulmonary Consult    Referred by Dr. Oneta Rack. Pt c/o cough since Oct 2019. He occ produces clear to brown sputum. Cough tends to be worse at night. Talking alot and laughing can trigger the cough. He has a rescue inhaler that he rarely uses.   breathing is fine unless coughing saba no better  Worse p supper and before bed and does ok sleeping  Cough attributed to tickle in throat tenormin started Sept 2019 but down to 25 mg daily      Kouffman Reflux v Neurogenic Cough Differentiator Reflux Comments  Do you awaken from a sound sleep coughing violently?                            With trouble breathing? no   Do you have choking episodes when you cannot  Get enough air, gasping for air ?              Yes    Do you usually cough when you lie down into  The bed, or when you just lie down to rest ?                          No    Do you usually cough after meals or eating?         no   Do you cough when (or after) you bend over?    no   GERD SCORE     Kouffman Reflux v Neurogenic Cough Differentiator Neurogenic   Do you more-or-less cough all day long? sporadic   Does change of temperature make you cough? Cool worse   Does laughing or chuckling cause you to  cough? yes   Do fumes (perfume, automobile fumes, burned  Toast, etc.,) cause you to cough ?      no   Does speaking, singing, or talking on the phone cause you to cough   ?               talking    Neurogenic/Airway score      No obvious other patterns in day to day or daytime variability or assoc excess/ purulent sputum or mucus plugs or hemoptysis or cp or chest tightness, subjective wheeze or overt sinus or hb symptoms.   Sleeping  without nocturnal  or early am exacerbation  of respiratory  c/o's or need for noct saba. Also denies any obvious fluctuation of symptoms with weather or environmental changes or other aggravating or alleviating factors except as outlined above   No unusual exposure hx or h/o  childhood pna/ asthma or knowledge of premature birth.  Current Allergies, Complete Past Medical History, Past Surgical History, Family History, and Social History were reviewed in [employer] record.  ROS  The following are not active complaints unless bolded Hoarseness, sore throat, dysphagia, dental problems, itching, sneezing,  nasal congestion or discharge of excess mucus or purulent secretions, ear ache,   fever, chills, sweats, unintended wt loss or wt gain, classically pleuritic or exertional cp,  orthopnea pnd or arm/hand swelling  or leg swelling, presyncope, palpitations, abdominal pain, anorexia, nausea, vomiting, diarrhea  or change in bowel habits or change in bladder habits, change in stools or change in urine, dysuria, hematuria,  rash, arthralgias, visual complaints, headache, numbness, weakness or ataxia or problems with walking or coordination,  change in mood or  memory.             Past Medical History:  Diagnosis Date  . Allergy   . Chronic sinusitis   . Diverticulosis of colon (without mention of hemorrhage)   . GERD (gastroesophageal reflux disease)   . Lyme disease   . Sleep deficient     Outpatient Medications Prior to Visit    Medication Sig Dispense Refill  . aspirin 81 MG tablet Take 81 mg by mouth daily.    Marland Kitchen atenolol (TENORMIN) 50 MG tablet Take 1 to 2 tablets / daily as directed for BP 90 tablet 1  . atovaquone-proguanil (MALARONE) 250-100 MG TABS Take 1 tablet by mouth daily. As needed for work    . calcium carbonate (TUMS - DOSED IN MG ELEMENTAL CALCIUM) 500 MG chewable tablet Chew 1 tablet by mouth daily.    . clindamycin (CLEOCIN T) 1 % lotion     . esomeprazole (NEXIUM) 40 MG capsule Take 1 capsule 2 x /day for Acid Indigestion & Heartburn 180 capsule 1  . famotidine (PEPCID) 40 MG tablet 1 tablet after breakfast and at bedtime 60 tablet 3  . gabapentin (NEURONTIN) 300 MG capsule Take 300 mg by mouth 3 (three) times daily.    . magic mouthwash SOLN Take 5 mLs by mouth 4 (four) times daily. Take 5 ml by mouth, for the throat and then spit.    . tamsulosin (FLOMAX) 0.4 MG CAPS capsule TAKE 1 CAPSULE (0.4 MG TOTAL) BY MOUTH DAILY AFTER SUPPER. 90 capsule 1  . testosterone cypionate (DEPOTESTOSTERONE CYPIONATE) 200 MG/ML injection INJECT INTO THE MUSCLE EVERY 2 WEEKS 10 mL 2  . triamcinolone (NASACORT ALLERGY 24HR) 55 MCG/ACT AERO nasal inhaler Place 2 sprays into the nose daily.    . VENTOLIN HFA 108 (90 Base) MCG/ACT inhaler     . montelukast (SINGULAIR) 10 MG tablet Take 1 tablet daily for Allergies 90 tablet 1  . Probiotic Product (PROBIOTIC DAILY PO) Take 1 capsule by mouth daily.           Objective:     BP 124/76 (BP Location: Left Arm, Cuff Size: Normal)   Pulse 65   Ht 5\' 9"  (1.753 m)   Wt 171 lb 6.4 oz (77.7 kg)   SpO2 97%   BMI 25.31 kg/m   SpO2: 97 % RA  amb pleasant wm with shrill upper airway cough pattern   HEENT: nl dentition, turbinates bilaterally, and oropharynx. Nl external ear canals without cough reflex   NECK :  without JVD/Nodes/TM/ nl carotid upstrokes bilaterally   LUNGS: no acc muscle use,  Nl contour chest with ? Subtle BV changes in bases   bilaterally  without cough on insp or exp maneuvers   CV:  RRR  no s3 or murmur or increase in P2, and no edema   ABD:  soft and nontender with nl inspiratory excursion in the supine position. No bruits or organomegaly appreciated, bowel sounds nl  MS:  Nl gait/ ext warm without deformities, calf tenderness, cyanosis or clubbing No obvious joint restrictions   SKIN: warm and dry without lesions    NEURO:  alert, approp, nl sensorium with  no motor or cerebellar deficits apparent.        I personally reviewed images and agree with radiology impression as follows:  CXR:   09/24/18 Lung volumes and mediastinal contours remain within normal limits. Visualized tracheal air column is within normal limits. No pneumothorax, pleural effusion or consolidation. Chronic increased interstitial markings appear stable. No acute or confluent pulmonary opacity. No acute osseous abnormality identified. Negative visible bowel gas pattern.         Assessment   Chronic cough Onset oct 2019  - Cyclical cough rx 10/24/2018  - HRCT chest ordered    The most common causes of chronic cough in immunocompetent adults include the following: upper airway cough syndrome (UACS), previously referred to as postnasal drip syndrome (PNDS), which is caused by variety of rhinosinus conditions; (2) asthma; (3) GERD; (4) chronic bronchitis from cigarette smoking or other inhaled environmental irritants; (5) nonasthmatic eosinophilic bronchitis; and (6) bronchiectasis.   These conditions, singly or in combination, have accounted for up to 94% of the causes of chronic cough in prospective studies.   Other conditions have constituted no >6% of the causes in prospective studies These have included bronchogenic carcinoma, chronic interstitial pneumonia, sarcoidosis, left ventricular failure, ACEI-induced cough, and aspiration from a condition associated with pharyngeal dysfunction.    Chronic cough is often simultaneously  caused by more than one condition. A single cause has been found from 38 to 82% of the time, multiple causes from 18 to 62%. Multiply caused cough has been the result of three diseases up to 42% of the time.   Given his subtle changes on cxr and BV changes on exam (albeit s clubbing) we need to do an HRCT to r/o occult ild but most likely this is just another case of UACS = Upper airway cough syndrome (previously labeled PNDS),  is so named because it's frequently impossible to sort out how much is  CR/sinusitis with freq throat clearing (which can be related to primary GERD)   vs  causing  secondary (" extra esophageal")  GERD from wide swings in gastric pressure that occur with throat clearing, often  promoting self use of mint and menthol lozenges that reduce the lower esophageal sphincter tone and exacerbate the problem further in a cyclical fashion.   These are the same pts (now being labeled as having "irritable larynx syndrome" by some cough centers) who not infrequently have a history of having failed to tolerate ace inhibitors,  dry powder inhalers or biphosphonates or report having atypical/extraesophageal reflux symptoms that don't respond to standard doses of PPI  and are easily confused as having aecopd or asthma flares by even experienced allergists/ pulmonologists (myself included).    Of the three most common causes of  Sub-acute / recurrent or chronic cough, only one (GERD)  can actually contribute to/ trigger  the other two (asthma and post nasal drip syndrome)  and perpetuate the cylce of cough.  While not intuitively obvious, many patients with chronic low grade reflux do  not cough until there is a primary insult that disturbs the protective epithelial barrier and exposes sensitive nerve endings.   This is typically viral but can due to PNDS and  either may apply here.     >>>  The point is that once this occurs, it is difficult to eliminate the cycle  using anything but a maximally  effective acid suppression regimen at least in the short run, accompanied by an appropriate diet to address non acid GERD and eliminate pnds with 1st gen H1 blockers per guidelines  And control  the cough itself for at least 3 days with tyl #3 then regroup p the holidays if not all better.    Total time devoted to counseling  > 50 % of initial 60 min office visit:  review case with pt/outlining cyclical cough diagram in black ink and interventions to interrupt it in red ink/  discussion of options/alternatives/ personally creating written customized instructions  in presence of pt  then going over those specific  Instructions directly with the pt including how to use all of the meds but in particular covering each new medication in detail and the difference between the maintenance= "automatic" meds and the prns using an action plan format for the latter (If this problem/symptom => do that organization reading Left to right).  Please see AVS from this visit for a full list of these instructions which I personally wrote for this pt and  are unique to this visit.           Sandrea Hughs, MD 10/24/2018

## 2018-10-24 NOTE — Patient Instructions (Addendum)
The key to effective treatment for your cough is eliminating the non-stop cycle of cough you're stuck in long enough to let your airway heal completely and then see if there is anything still making you cough once you stop the cough suppression, but this should take no more than 5 days to figure out  First take delsym two tsp every 12 hours and supplement if needed with  Tylenol #3  up to 2 every 4 hours to suppress the urge to cough at all or even clear your throat. Swallowing water or using ice chips/non mint and menthol containing candies (such as lifesavers or sugarless jolly ranchers) are also effective.  You should rest your voice and avoid activities that you know make you cough.  Once you have eliminated the cough for 3 straight days try reducing the tylenol #3  first,  then the delsym as tolerated.    Prednisone 10 mg take  4 each am x 2 days,   2 each am x 2 days,  1 each am x 2 days and stop (this is to eliminate allergies and inflammation from coughing)  Nexium  Take 30-60 min before first and last  Meals of the day and Pepcid 20 mg one bedtime plus chlorpheniramine 4 mg (chlortab at [facility])  x 2 at bedtime (both available over the counter)  until cough is completely gone for at least a week without the need for cough suppression    GERD (REFLUX)  is an extremely common cause of respiratory symptoms just like yours , many times with no obvious heartburn at all.    It can be treated with medication, but also with lifestyle changes including elevation of the head of your bed (ideally with 6-8 in  inch  bed blocks),  Smoking cessation, avoidance of late meals, excessive alcohol, and avoid fatty foods, chocolate, peppermint, colas, red wine, and acidic juices such as orange juice.  NO MINT OR MENTHOL PRODUCTS SO NO COUGH DROPS   USE SUGARLESS CANDY INSTEAD (Jolley ranchers or Stover's or Life Savers) or even ice chips will also do - the key is to swallow to prevent all throat clearing. NO  OIL BASED VITAMINS - use powdered substitutes.    If not 100% better > return with all meds in hand

## 2018-10-25 ENCOUNTER — Telehealth: Payer: Self-pay | Admitting: Internal Medicine

## 2018-10-25 ENCOUNTER — Encounter: Payer: Self-pay | Admitting: Internal Medicine

## 2018-10-25 NOTE — Telephone Encounter (Signed)
Called and spoke to pt, who is requesting that 10/14/18 OV noted be faxed to his job. Advised pt that we were unable to fax last ov to his job, due to not knowing if fax number is secure.  Offered to print last ov note off and pt come by our office for pick up.  Pt states he will come by our office on 10/26/18 to pickup ov note. Ov note has been placed up front for pickup.  Nothing further is needed.

## 2018-10-25 NOTE — Assessment & Plan Note (Addendum)
Onset oct 2019  - Cyclical cough rx 76/28/3151  - HRCT chest ordered    The most common causes of chronic cough in immunocompetent adults include the following: upper airway cough syndrome (UACS), previously referred to as postnasal drip syndrome (PNDS), which is caused by variety of rhinosinus conditions; (2) asthma; (3) GERD; (4) chronic bronchitis from cigarette smoking or other inhaled environmental irritants; (5) nonasthmatic eosinophilic bronchitis; and (6) bronchiectasis.   These conditions, singly or in combination, have accounted for up to 94% of the causes of chronic cough in prospective studies.   Other conditions have constituted no >6% of the causes in prospective studies These have included bronchogenic carcinoma, chronic interstitial pneumonia, sarcoidosis, left ventricular failure, ACEI-induced cough, and aspiration from a condition associated with pharyngeal dysfunction.    Chronic cough is often simultaneously caused by more than one condition. A single cause has been found from 38 to 82% of the time, multiple causes from 18 to 62%. Multiply caused cough has been the result of three diseases up to 42% of the time.   Given his subtle changes on cxr and BV changes on exam (albeit s clubbing) we need to do an HRCT to r/o occult ild but most likely this is just another case of UACS = Upper airway cough syndrome (previously labeled PNDS),  is so named because it's frequently impossible to sort out how much is  CR/sinusitis with freq throat clearing (which can be related to primary GERD)   vs  causing  secondary (" extra esophageal")  GERD from wide swings in gastric pressure that occur with throat clearing, often  promoting self use of mint and menthol lozenges that reduce the lower esophageal sphincter tone and exacerbate the problem further in a cyclical fashion.   These are the same pts (now being labeled as having "irritable larynx syndrome" by some cough centers) who not infrequently  have a history of having failed to tolerate ace inhibitors,  dry powder inhalers or biphosphonates or report having atypical/extraesophageal reflux symptoms that don't respond to standard doses of PPI  and are easily confused as having aecopd or asthma flares by even experienced allergists/ pulmonologists (myself included).    Of the three most common causes of  Sub-acute / recurrent or chronic cough, only one (GERD)  can actually contribute to/ trigger  the other two (asthma and post nasal drip syndrome)  and perpetuate the cylce of cough.  While not intuitively obvious, many patients with chronic low grade reflux do not cough until there is a primary insult that disturbs the protective epithelial barrier and exposes sensitive nerve endings.   This is typically viral but can due to PNDS and  either may apply here.     >>>  The point is that once this occurs, it is difficult to eliminate the cycle  using anything but a maximally effective acid suppression regimen at least in the short run, accompanied by an appropriate diet to address non acid GERD and eliminate pnds with 1st gen H1 blockers per guidelines  And control  the cough itself for at least 3 days with tyl #3 then regroup p the holidays if not all better.    Total time devoted to counseling  > 50 % of initial 60 min office visit:  review case with pt/outlining cyclical cough diagram in black ink and interventions to interrupt it in red ink/  discussion of options/alternatives/ personally creating written customized instructions  in presence of pt  then going over  those specific  Instructions directly with the pt including how to use all of the meds but in particular covering each new medication in detail and the difference between the maintenance= "automatic" meds and the prns using an action plan format for the latter (If this problem/symptom => do that organization reading Left to right).  Please see AVS from this visit for a full list of these  instructions which I personally wrote for this pt and  are unique to this visit.

## 2018-11-01 DIAGNOSIS — Z85828 Personal history of other malignant neoplasm of skin: Secondary | ICD-10-CM | POA: Diagnosis not present

## 2018-11-01 DIAGNOSIS — D485 Neoplasm of uncertain behavior of skin: Secondary | ICD-10-CM | POA: Diagnosis not present

## 2018-11-01 DIAGNOSIS — D2262 Melanocytic nevi of left upper limb, including shoulder: Secondary | ICD-10-CM | POA: Diagnosis not present

## 2018-11-01 DIAGNOSIS — L821 Other seborrheic keratosis: Secondary | ICD-10-CM | POA: Diagnosis not present

## 2018-11-01 DIAGNOSIS — L570 Actinic keratosis: Secondary | ICD-10-CM | POA: Diagnosis not present

## 2018-11-02 ENCOUNTER — Ambulatory Visit (INDEPENDENT_AMBULATORY_CARE_PROVIDER_SITE_OTHER)
Admission: RE | Admit: 2018-11-02 | Discharge: 2018-11-02 | Disposition: A | Discharge status: Home / Self Care | Payer: 59 | Source: Ambulatory Visit | Attending: Internal Medicine | Admitting: Internal Medicine

## 2018-11-02 DIAGNOSIS — R05 Cough: Secondary | ICD-10-CM

## 2018-11-02 DIAGNOSIS — R059 Cough, unspecified: Secondary | ICD-10-CM

## 2018-11-02 DIAGNOSIS — R053 Chronic cough: Secondary | ICD-10-CM

## 2018-11-02 DIAGNOSIS — R918 Other nonspecific abnormal finding of lung field: Secondary | ICD-10-CM | POA: Diagnosis not present

## 2018-11-05 ENCOUNTER — Telehealth: Payer: Self-pay | Admitting: Gastroenterology

## 2018-11-05 NOTE — Telephone Encounter (Signed)
The pt wanted to be sure we have a copy of the CT scan. I will forward message to Ellouise Newer, PA

## 2018-11-05 NOTE — Telephone Encounter (Signed)
Pt called to ask about results of CT scan.

## 2018-11-05 NOTE — Progress Notes (Signed)
Spoke with pt and notified of results per Dr. Wert. Pt verbalized understanding and denied any questions. 

## 2018-11-06 NOTE — Telephone Encounter (Signed)
Please let patient know Dr. Melvyn Novas his Pulmonologist is the one who ordered CT chest, he would need to call their office for results. Thanks-JLL

## 2018-11-13 DIAGNOSIS — L570 Actinic keratosis: Secondary | ICD-10-CM | POA: Diagnosis not present

## 2018-11-13 DIAGNOSIS — Z85828 Personal history of other malignant neoplasm of skin: Secondary | ICD-10-CM | POA: Diagnosis not present

## 2018-11-13 DIAGNOSIS — D1801 Hemangioma of skin and subcutaneous tissue: Secondary | ICD-10-CM | POA: Diagnosis not present

## 2018-11-13 DIAGNOSIS — L821 Other seborrheic keratosis: Secondary | ICD-10-CM | POA: Diagnosis not present

## 2018-11-16 ENCOUNTER — Ambulatory Visit (INDEPENDENT_AMBULATORY_CARE_PROVIDER_SITE_OTHER): Payer: 59

## 2018-11-16 VITALS — BP 122/64 | Temp 98.9°F | Ht 69.0 in | Wt 173.2 lb

## 2018-11-16 DIAGNOSIS — E291 Testicular hypofunction: Secondary | ICD-10-CM

## 2018-11-16 MED ORDER — TESTOSTERONE CYPIONATE 200 MG/ML IM SOLN: 200.0000 mg | INTRAMUSCULAR | Status: DC

## 2018-11-16 NOTE — Progress Notes (Signed)
Patient here for a NV for his Testosterone Cypionate 200 mg/ml 2 ml IMRIGHTupper outer quadrant injection. Patient tolerated well and will return in 3 weeks for his next injection 

## 2018-11-20 DIAGNOSIS — R05 Cough: Secondary | ICD-10-CM | POA: Diagnosis not present

## 2018-11-20 DIAGNOSIS — R0982 Postnasal drip: Secondary | ICD-10-CM | POA: Diagnosis not present

## 2018-11-27 ENCOUNTER — Ambulatory Visit: Payer: 59 | Admitting: Gastroenterology

## 2018-12-08 DIAGNOSIS — K224 Dyskinesia of esophagus: Secondary | ICD-10-CM

## 2018-12-08 HISTORY — DX: Dyskinesia of esophagus: K22.4

## 2018-12-10 ENCOUNTER — Ambulatory Visit (INDEPENDENT_AMBULATORY_CARE_PROVIDER_SITE_OTHER): Payer: 59 | Admitting: Physician Assistant

## 2018-12-10 ENCOUNTER — Encounter: Payer: Self-pay | Admitting: Physician Assistant

## 2018-12-10 VITALS — BP 136/80 | HR 76 | Temp 97.7°F | Ht 69.0 in | Wt 175.0 lb

## 2018-12-10 DIAGNOSIS — J069 Acute upper respiratory infection, unspecified: Secondary | ICD-10-CM | POA: Diagnosis not present

## 2018-12-10 DIAGNOSIS — E291 Testicular hypofunction: Secondary | ICD-10-CM

## 2018-12-10 DIAGNOSIS — R6889 Other general symptoms and signs: Secondary | ICD-10-CM

## 2018-12-10 LAB — POC INFLUENZA A&B (BINAX/QUICKVUE)
Influenza A, POC: NEGATIVE
Influenza B, POC: NEGATIVE

## 2018-12-10 MED ORDER — IPRATROPIUM-ALBUTEROL 0.5-2.5 (3) MG/3ML IN SOLN: 3.0000 mL | Freq: Once | RESPIRATORY_TRACT | Status: DC

## 2018-12-10 MED ORDER — BENZONATATE 200 MG PO CAPS: 200.0000 mg | ORAL_CAPSULE | Freq: Three times a day (TID) | ORAL | 0 refills | Status: DC | PRN

## 2018-12-10 MED ORDER — TESTOSTERONE CYPIONATE 200 MG/ML IM SOLN
200.0000 mg | INTRAMUSCULAR | Status: DC
  Administered 2018-12-10: 200 mg via INTRAMUSCULAR

## 2018-12-10 MED ORDER — PREDNISONE 20 MG PO TABS: ORAL_TABLET | ORAL | 0 refills | Status: DC

## 2018-12-10 MED ORDER — FLUTICASONE FUROATE-VILANTEROL 100-25 MCG/INH IN AEPB: 1.0000 | INHALATION_SPRAY | Freq: Every day | RESPIRATORY_TRACT | 0 refills | Status: DC

## 2018-12-10 NOTE — Patient Instructions (Signed)
INFORMATION ABOUT YOUR STEROID INHALER  Can do steroid inhaler, NEED TO DO DAILY, this is NOT a rescue inhaler so if you are acutely short of breath please use your albuterol or call 911.  Do 1 puff ONCE a day.  Do before you brush your teeth OR wash your mouth afterwards.  IF YOU DO NOT [place] YOUR MOUTH OUT IT CAN CAUSE YEAST Can do 2 tsp vinegar with water and switch to help prevent yeast or help yeast in your mouth.   Go to the ER if any chest pain, shortness of breath, nausea, dizziness, severe HA, changes vision/speech  COLD INFORMATION  Try just the allergy pill and prednisone for a few days, you always want to give your body 10 days to fight off infection with help before taking an anabiotic.   BEWARE ANTIBIOTICS Antibiotics have been linked with colon infections, resistance and newest theory is colon cancer in 40-50 year olds. So it is VERY important to try to avoid them, antibiotics are NOT risk free medications.   If you are not feeling better make an office visit OR CONTACT us.   Here is more info below HOW TO TREAT VIRAL COUGH AND COLD SYMPTOMS:  -Symptoms usually last at least 1 week with the worst symptoms being around day 4.  - colds usually start with a sore throat and end with a cough, and the cough can take 2 weeks to get better.  -No antibiotics are needed for colds, flu, sore throats, cough, bronchitis UNLESS symptoms are longer than 7 days OR if you are getting better then get drastically worse.  -There are a lot of combination medications (Dayquil, Nyquil, Vicks 44, tyelnol cold and sinus, ETC). Please look at the ingredients on the back so that you are treating the correct symptoms and not doubling up on medications/ingredients.    Medicines you can use  Nasal congestion  Little Remedies saline spray (aerosol/mist)- can try this, it is in the kids section - pseudoephedrine (Sudafed)- behind the counter, do not use if you have high blood pressure, medicine that  have -D in them.  - phenylephrine (Sudafed PE) -Dextormethorphan + chlorpheniramine (Coridcidin HBP)- okay if you have high blood pressure -Oxymetazoline (Afrin) nasal spray- LIMIT to 3 days -Saline nasal spray -Neti pot (used distilled or bottled water)  Ear pain/congestion  -pseudoephedrine (sudafed) - Nasonex/flonase nasal spray  Fever  -Acetaminophen (Tyelnol) -Ibuprofen (Advil, motrin, aleve)  Sore Throat  -Acetaminophen (Tyelnol) -Ibuprofen (Advil, motrin, aleve) -Drink a lot of water -Gargle with salt water - Rest your voice (don't talk) -Throat sprays -Cough drops  Body Aches  -Acetaminophen (Tyelnol) -Ibuprofen (Advil, motrin, aleve)  Headache  -Acetaminophen (Tyelnol) -Ibuprofen (Advil, motrin, aleve) - Exedrin, Exedrin Migraine  Allergy symptoms (cough, sneeze, runny nose, itchy eyes) -Claritin or loratadine cheapest but likely the weakest  -Zyrtec or certizine at night because it can make you sleepy -The strongest is allegra or fexafinadine  Cheapest at [employer], [employer], [employer]  Cough  -Dextromethorphan (Delsym)- medicine that has DM in it -Guafenesin (Mucinex/Robitussin) - cough drops - drink lots of water  Chest Congestion  -Guafenesin (Mucinex/Robitussin)  Red Itchy Eyes  - Naphcon-A  Upset Stomach  - Bland diet (nothing spicy, greasy, fried, and high acid foods like tomatoes, oranges, berries) -OKAY- cereal, bread, soup, crackers, rice -Eat smaller more frequent meals -reduce caffeine, no alcohol -Loperamide (Imodium-AD) if diarrhea -Prevacid for heart burn  General health when sick  -Hydration -wash your hands frequently -keep surfaces clean -change  pillow cases and sheets often -Get fresh air but do not exercise strenuously -Vitamin D, double up on it - Vitamin C -Zinc

## 2018-12-10 NOTE — Progress Notes (Signed)
Patient here for a NV for his Testosterone Cypionate 200 mg/ml 2 ml IM LEFTupper outer quadrant injection. Patient tolerated well and will return in 3 weeks for his next injection 

## 2018-12-10 NOTE — Progress Notes (Signed)
Subjective:    Patient ID: Jay Brooks, male    DOB: November 11, 1960, 58 y.o.   MRN: [id number]  HPI 58 y.o. WM presents with URI x friday.  He has cough, yellow brown mucus, vomiting x Saturday, vomited this AM. He has body aches, feels weak, sinus pressure. A lot of chills Friday but no objective temperature. No recent travel.    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 He has seen pulmonary and ENT for cough. He is on 40 mg of nexium twice a day, pepcid 40 mg BID. He is on flonase and astrovent nasal spray.   Blood pressure 136/80, pulse 76, temperature 97.7 F (36.5 C), height 5\' 9"  (1.753 m), weight 175 lb (79.4 kg), SpO2 98 %.  Medications Current Outpatient Medications on File Prior to Visit  Medication Sig  . acetaminophen-codeine (TYLENOL #3) 300-30 MG tablet Take 1-2 tablets by mouth every 4 (four) hours as needed (cough).  Marland Kitchen aspirin 81 MG tablet Take 81 mg by mouth daily.  Marland Kitchen atenolol (TENORMIN) 50 MG tablet Take 1 to 2 tablets / daily as directed for BP  . atovaquone-proguanil (MALARONE) 250-100 MG TABS Take 1 tablet by mouth daily. As needed for work  . calcium carbonate (TUMS - DOSED IN MG ELEMENTAL CALCIUM) 500 MG chewable tablet Chew 1 tablet by mouth daily.  . clindamycin (CLEOCIN T) 1 % lotion   . esomeprazole (NEXIUM) 40 MG capsule Take 1 capsule 2 x /day for Acid Indigestion & Heartburn  . famotidine (PEPCID) 40 MG tablet 1 tablet after breakfast and at bedtime  . gabapentin (NEURONTIN) 300 MG capsule Take 300 mg by mouth 3 (three) times daily.  . magic mouthwash SOLN Take 5 mLs by mouth 4 (four) times daily. Take 5 ml by mouth, for the throat and then spit.  . tamsulosin (FLOMAX) 0.4 MG CAPS capsule TAKE 1 CAPSULE (0.4 MG TOTAL) BY MOUTH DAILY AFTER SUPPER.  Marland Kitchen testosterone cypionate (DEPOTESTOSTERONE CYPIONATE) 200 MG/ML injection INJECT 2ML INTO THE MUSCLE EVERY 2 WEEKS  . VENTOLIN HFA 108 (90 Base) MCG/ACT inhaler    Current Facility-Administered Medications on  File Prior to Visit  Medication  . testosterone cypionate (DEPOTESTOSTERONE CYPIONATE) injection 200 mg    Problem list He has Chronic cough; GERD (gastroesophageal reflux disease); Chronic rhinitis; Testosterone Deficiency; Hyperlipidemia; Medication management; Labile hypertension; Prediabetes; Vitamin D deficiency; and Prostatism on their problem list.   Review of Systems  Constitutional: Negative for chills, diaphoresis and fever.  HENT: Positive for congestion, postnasal drip, rhinorrhea, sinus pressure and sore throat. Negative for ear pain, sneezing, trouble swallowing and voice change.   Eyes: Negative.   Respiratory: Positive for cough, shortness of breath and wheezing. Negative for chest tightness.   Cardiovascular: Negative.   Gastrointestinal: Negative.   Genitourinary: Negative.   Musculoskeletal: Negative.  Negative for neck pain.  Neurological: Negative.  Negative for headaches.       Objective:   Physical Exam Constitutional:      Appearance: He is well-developed.  HENT:     Head: Normocephalic and atraumatic.     Right Ear: External ear normal.     Left Ear: External ear normal.     Nose: Nose normal.  Eyes:     Conjunctiva/sclera: Conjunctivae normal.     Pupils: Pupils are equal, round, and reactive to light.  Neck:     Musculoskeletal: Normal range of motion and neck supple.  Cardiovascular:     Rate and Rhythm:  Normal rate and regular rhythm.     Heart sounds: Normal heart sounds. No murmur.  Pulmonary:     Effort: Pulmonary effort is normal. No respiratory distress.     Breath sounds: No stridor. Wheezing present. No rhonchi or rales.  Chest:     Chest wall: No tenderness.  Abdominal:     General: Bowel sounds are normal.     Palpations: Abdomen is soft.  Lymphadenopathy:     Cervical: No cervical adenopathy.  Skin:    General: Skin is warm and dry.  Neurological:     Mental Status: He is alert and oriented to person, place, and time.         Assessment & Plan:  Jay Brooks was seen today for acute visit, chills, generalized body aches, nasal congestion, fatigue, cough and emesis.  Diagnoses and all orders for this visit:  Testosterone Deficiency -     testosterone cypionate (DEPOTESTOSTERONE CYPIONATE) injection 200 mg  Flu-like symptoms -     POC Influenza A&B(BINAX/QUICKVUE)- negative  Upper respiratory tract infection, unspecified type -     ipratropium-albuterol (DUONEB) 0.5-2.5 (3) MG/3ML nebulizer solution 3 mL -     predniSONE (DELTASONE) 20 MG tablet; 2 tablets daily for 3 days, 1 tablet daily for 4 days. -     fluticasone furoate-vilanterol (BREO ELLIPTA) 100-25 MCG/INH AEPB; Inhale 1 puff into the lungs daily. Rinse mouth with water after each use -     benzonatate (TESSALON) 200 MG capsule; Take 1 capsule (200 mg total) by mouth 3 (three) times daily as needed for cough (Max: 600mg  per day).  Discussed the importance of avoiding unnecessary antibiotic therapy. Suggested symptomatic OTC remedies. Nasal saline spray for congestion. Nasal steroids, allergy pill, oral steroids given with breo Follow up pulmonary if not better Out of work until 12/17/2018  Follow up as needed.

## 2018-12-18 ENCOUNTER — Other Ambulatory Visit: Payer: Self-pay | Admitting: Internal Medicine

## 2018-12-18 ENCOUNTER — Ambulatory Visit (INDEPENDENT_AMBULATORY_CARE_PROVIDER_SITE_OTHER): Payer: 59 | Admitting: Internal Medicine

## 2018-12-18 ENCOUNTER — Telehealth: Payer: Self-pay | Admitting: Internal Medicine

## 2018-12-18 ENCOUNTER — Encounter: Payer: Self-pay | Admitting: Internal Medicine

## 2018-12-18 VITALS — BP 118/80 | HR 78 | Ht 69.0 in | Wt 174.0 lb

## 2018-12-18 DIAGNOSIS — R05 Cough: Secondary | ICD-10-CM

## 2018-12-18 DIAGNOSIS — R053 Chronic cough: Secondary | ICD-10-CM

## 2018-12-18 MED ORDER — FAMOTIDINE 20 MG PO TABS: ORAL_TABLET | ORAL | 11 refills | Status: DC

## 2018-12-18 MED ORDER — GABAPENTIN 300 MG PO CAPS: 300.0000 mg | ORAL_CAPSULE | Freq: Three times a day (TID) | ORAL | 2 refills | Status: DC

## 2018-12-18 MED ORDER — RABEPRAZOLE SODIUM 20 MG PO TBEC: DELAYED_RELEASE_TABLET | ORAL | 2 refills | Status: DC

## 2018-12-18 MED ORDER — PREDNISONE 10 MG PO TABS: ORAL_TABLET | ORAL | 0 refills | Status: DC

## 2018-12-18 NOTE — Progress Notes (Signed)
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  Not able to work since Oct 2019 on [employer] disability for up to 2 years   Also c/o chronic  abd pain started before the flare cough x several years and never able to stop tums s flare even on max gerd rx. Last egd neg 09/08/17   Original pulmonary eval 01/2013 cough improved on gerd rx/ cyclical cough regimen but persistent throat tickle and abd pain so referred to [facility] in  GI at that point and the problem never resolved per pt.     10/24/2018  Pulmonary/ 1st office eval/Jay Brooks  Chief Complaint  Patient presents with  . Pulmonary Consult    Referred by Dr. Oneta Brooks. Pt c/o cough since Oct 2019. He occ produces clear to brown sputum. Cough tends to be worse at night. Talking alot and laughing can trigger the cough. He has a rescue inhaler that he rarely uses.   breathing is fine unless coughing saba no better  Worse p supper and before bed and does ok sleeping  Cough attributed to tickle in throat tenormin started Sept 2019 but down to 25 mg daily Kouffman Reflux v Neurogenic Cough Differentiator Reflux Comments  Do you awaken from a sound sleep coughing violently?                            With trouble breathing? no   Do you have choking episodes when you cannot  Get enough air, gasping for air ?              Yes    Do you usually cough when you lie down  into  The bed, or when you just lie down to rest ?                          No    Do you usually cough after meals or eating?         no   Do you cough when (or after) you bend over?    no   GERD SCORE     Kouffman Reflux v Neurogenic Cough Differentiator Neurogenic   Do you more-or-less cough all day long? sporadic   Does change of temperature make you cough? Cool worse   Does laughing or chuckling cause you to cough? yes   Do fumes (perfume, automobile fumes, burned  Toast, etc.,) cause you to cough ?      no   Does speaking, singing, or talking on the phone cause you to cough   ?               talking    Neurogenic/Airway score    rec First take delsym two tsp every  12 hours and supplement if needed with  Tylenol #3  up to 2 every 4 hours to suppress the urge to cough at all or even clear your throat. Prednisone 10 mg take  4 each am x 2 days,   2 each am x 2 days,  1 each am x 2 days and stop (this is to eliminate allergies and inflammation from coughing) Nexium  Take 30-60 min before first and last  Meals of the day and Pepcid 20 mg one bedtime plus chlorpheniramine 4 mg (chlortab at [facility])  x 2 at bedtime (both available over the counter)  until cough is completely gone for at least a week GERD  Diet  If not 100% better > return with all meds in hand    12/18/2018  f/u ov/Jay Brooks re: cough x Oct 2019  Now on BREO / did not bring meds  Chief Complaint  Patient presents with  . Follow-up    Cough still bothering him- non prod.   Dyspnea:  Breathing fine as long as not coughing  Cough: day time/ dry severe to point of gag/ vomit Sleeping: no problem at all with cough at hs but chronically sleeps very poorly  SABA use: none  02: none  Has to take tums to keep from abd and vomiting  Finds sipping water or lozenges help as much as anything else  Can't tol h1 during the day  No better on singulair in past, nor saba, and now no better on breo or pred just finished today   No  obvious day to day or daytime variability or assoc excess/ purulent sputum or mucus plugs or hemoptysis or cp or chest tightness, subjective wheeze    Sleeping without nocturnal  or early am exacerbation  of respiratory  c/o's or need for noct saba. Also denies any obvious fluctuation of symptoms with weather or environmental changes or other aggravating or alleviating factors except as outlined above   No unusual exposure hx or h/o childhood pna/ asthma or knowledge of premature birth.  Current Allergies, Complete Past Medical History, Past Surgical History, Family History, and Social History were reviewed in Jay Brooks record.  ROS  The following are not active complaints unless bolded Hoarseness, sore throat, dysphagia, dental problems, itching, sneezing,  nasal congestion or discharge of excess mucus or purulent secretions, ear ache,   fever, chills, sweats, unintended wt loss or wt gain, classically pleuritic or exertional cp,  orthopnea pnd or arm/hand swelling  or leg swelling, presyncope, palpitations, abdominal pain, anorexia, nausea, vomiting, diarrhea  or change in bowel habits or change in bladder habits, change in stools or change in urine, dysuria, hematuria,  rash, arthralgias, visual complaints, headache, numbness, weakness or ataxia or problems with walking or coordination,  change in mood or  memory.        Current Meds -  - NOTE:   Unable to verify as accurately reflecting what pt takes     Medication Sig  . acetaminophen-codeine (TYLENOL #3) 300-30 MG tablet Take 1-2 tablets by mouth every 4 (four) hours as needed (cough).  Marland Kitchen aspirin 81 MG tablet Take 81 mg by mouth daily.  Marland Kitchen atenolol (TENORMIN) 50 MG tablet 1/2 tablet daily   . atovaquone-proguanil (MALARONE) 250-100 MG TABS Take 1 tablet by mouth daily. As needed for work  . benzonatate (TESSALON) 200 MG capsule Take 1 capsule (200 mg total) by mouth 3 (three) times daily as needed for cough (Max:  600mg  per day).  Marland Kitchen  calcium carbonate (TUMS - DOSED IN MG ELEMENTAL CALCIUM) 500 MG chewable tablet Chew 1 tablet by mouth daily.  . clindamycin (CLEOCIN T) 1 % lotion   . esomeprazole (NEXIUM) 40 MG capsule Take 1 capsule 2 x /day for Acid Indigestion & Heartburn  . famotidine (PEPCID) 40 MG tablet 1 tablet after breakfast and at bedtime  . fluticasone furoate-vilanterol (BREO ELLIPTA) 100-25 MCG/INH AEPB Inhale 1 puff into the lungs daily. Rinse mouth with water after each use  . gabapentin (NEURONTIN) 300 MG capsule Take 300 mg by mouth 3 (three) times daily.  . tamsulosin (FLOMAX) 0.4 MG CAPS capsule TAKE 1 CAPSULE (0.4 MG TOTAL) BY MOUTH DAILY AFTER SUPPER.  Marland Kitchen testosterone cypionate (DEPOTESTOSTERONE CYPIONATE) 200 MG/ML injection INJECT INTO THE MUSCLE EVERY 2 WEEKS  . VENTOLIN HFA 108 (90 Base) MCG/ACT inhaler         Objective:      amb wm nad   Wt Readings from Last 3 Encounters:  12/18/18 174 lb (78.9 kg)  12/10/18 175 lb (79.4 kg)  11/16/18 173 lb 3.2 oz (78.6 kg)     Vital signs reviewed - Note on arrival 02 sats  98% on RA       amb pleasant wm with shrill upper airway cough pattern  HEENT: nl dentition,   and oropharynx. Nl external ear canals without cough reflex -  Mod/severe bilateral non-specific turbinate edema     NECK :  without JVD/Nodes/TM/ nl carotid upstrokes bilaterally   LUNGS: no acc muscle use,  Nl contour chest with coarse bs bilaterally and lots of transmitted noise from upper airway without cough on insp or exp maneuvers   CV:  RRR  no s3 or murmur or increase in P2, and no edema   ABD:  soft and nontender with nl inspiratory excursion in the supine position. No bruits or organomegaly appreciated, bowel sounds nl  MS:  Nl gait/ ext warm without deformities, calf tenderness, cyanosis or clubbing No obvious joint restrictions   SKIN: warm and dry without lesions    NEURO:  alert, approp, nl sensorium with  no motor or cerebellar  deficits apparent.             I personally reviewed images and agree with radiology impression as follows:    - HRCT chest 11/02/18  1. No evidence of interstitial lung disease. 2. Scattered tiny subpleural pulmonary nodules at the peripheral right lung base, largest 3 mm. No follow-up needed if patient is low-risk          Assessment

## 2018-12-18 NOTE — Telephone Encounter (Signed)
Pt needs a PA for Aciphex prescribed at today's office visit. Initiated PA via [email or web address] Key: [id number] PA has been sent to plan, a determination is expected within 3 business days.  Will hold in triage to follow up on.

## 2018-12-18 NOTE — Patient Instructions (Addendum)
The key to effective treatment for your cough is eliminating the non-stop cycle of cough you're stuck in long enough to let your airway heal completely and then see if there is anything still making you cough once you stop the cough suppression, but this should take no more than 5 days to figure out.  Gabapentin 300 mg three times daily   First take delsym two tsp every 12 hours and supplement if needed with  Tylenol #3  up to 1- 2 every 4 hours to suppress the urge to cough at all or even clear your throat. Swallowing water or using ice chips/non mint and menthol containing candies (such as lifesavers or sugarless jolly ranchers) are also effective.  You should rest your voice and avoid activities that you know make you cough.  Once you have eliminated the cough for 3 straight days try reducing the tylenol #3  first,  then the delsym as tolerated    Prednisone 10 mg take  4 each am x 2 days,   2 each am x 2 days,  1 each am x 2 days and stop (this is to eliminate allergies and inflammation from coughing)  Aciphex 20 mg Take 30- 60 min before your first and last meals of the day and Pepcid 40 mg after supper     GERD (REFLUX)  is an extremely common cause of respiratory symptoms just like yours , many times with no obvious heartburn at all.    It can be treated with medication, but also with lifestyle changes including elevation of the head of your bed (ideally with 6-8 in  inch  bed blocks),  Smoking cessation, avoidance of late meals, excessive alcohol, and avoid fatty foods, chocolate, peppermint, colas, red wine, and acidic juices such as orange juice.  NO MINT OR MENTHOL PRODUCTS SO NO COUGH DROPS   USE SUGARLESS CANDY INSTEAD (Jolley ranchers or Stover's or Life Savers) or even ice chips will also do - the key is to swallow to prevent all throat clearing. NO OIL BASED VITAMINS - use powdered substitutes.   Please see patient coordinator before you leave today  to schedule  DgEs  Next    Call me 12/24/18 if not cough better as you will need a methacholine challenge

## 2018-12-19 ENCOUNTER — Encounter: Payer: Self-pay | Admitting: Internal Medicine

## 2018-12-19 ENCOUNTER — Telehealth: Payer: Self-pay | Admitting: *Deleted

## 2018-12-19 ENCOUNTER — Other Ambulatory Visit: Payer: Self-pay | Admitting: Internal Medicine

## 2018-12-19 DIAGNOSIS — R05 Cough: Secondary | ICD-10-CM

## 2018-12-19 DIAGNOSIS — R053 Chronic cough: Secondary | ICD-10-CM

## 2018-12-19 NOTE — Telephone Encounter (Signed)
Checked PA on [email or web address] and it is still processing. Called and spoke with patient and advised him of message below. Will contact patient once we have a response.

## 2018-12-19 NOTE — Telephone Encounter (Signed)
Fax received from [facility] The Aciphex 20 mg has been denied as not covered by patient plan. Fax received stated "peer to peer" can be done - contact # [phone] or an appeal can be made. No alternative medications were listed in fax received. Called [facility] [phone]) Spoke with Mariel who advised his plan will not cover the brand medication, but the generic only. Called [facility] spoke with Caryl Pina stated they submitted the prescription as the generic.  It kicked back stating there was a limitation of 1 tablet daily.   Dr. Melvyn Novas - Can the patient be issued an alternative since his insurance only allows 1 tablet daily? Otherwise a PA will be required.

## 2018-12-19 NOTE — Telephone Encounter (Signed)
-----   Message from Tanda Rockers, MD sent at 12/19/2018  5:49 AM EST ----- Call to find out why he's on malarone and set up an appt in one week with all meds in hand (whether he's feeling better on not)- let him know I reviewed his chart all the way back to 2014 and lots to go over to come up with a plan moving forward.

## 2018-12-19 NOTE — Telephone Encounter (Signed)
Spoke with the pt  He states he only takes Malarone when he travels to a place where malaria is a concern  He keeps it on hand as needed  I have scheduled his appt for f/u

## 2018-12-19 NOTE — Telephone Encounter (Signed)
It may be his gi problems are being caused by ppi and so I wanted him to try aciphex as it has a better track record in this regard but only needs to take this for 2 weeks to sort out so fill it as one ac daily but tell pt to take bid ac till sort out cause and effect

## 2018-12-19 NOTE — Assessment & Plan Note (Signed)
Originally developed around 2014 with persistent sense of pnds since  Severe flare Onset oct 2019 > unable to work as Insurance underwriter since  - Cyclical cough rx 18/56/3149  - HRCT chest 11/02/18  1. No evidence of interstitial lung disease. 2. Scattered tiny subpleural pulmonary nodules at the peripheral right lung base, largest 3 mm. No follow-up needed if patient is low-risk = never smoker, no known primary > no f/u needed  - Repeat cyclical cough rx 05/08/6377 and add gabapentin 300 tid    Of the three most common causes of  Sub-acute / recurrent or chronic cough, only one (GERD)  can actually contribute to/ trigger  the other two (asthma and post nasal drip syndrome)  and perpetuate the cylce of cough.   While not intuitively obvious, many patients with chronic low grade reflux do not cough until there is a primary insult that disturbs the protective epithelial barrier and exposes sensitive nerve endings.   This is typically viral but can due to PNDS and  either may apply here.     The point is that once this occurs, it is difficult to eliminate the cycle  using anything but a maximally effective acid suppression regimen at least in the short run, accompanied by an appropriate diet to address non acid GERD and control / completely eliminate the cough itself for at least 3 days using deslym and codeine up to 60 mg every 4 hours which so far he has failed to do and should try this first before going further eg MCT next    rec D/c breo which tends to aggravate the uacs  One more short course of prednisone while eliminating cyclical cough as above  rechallenge with Gabapentin and if not better and neg MCT needs eval by [facility] voice center  Change gerd rx to aciphex bid and h2hs and try off tums which can make gastric pressures higher and aggravate non acid gerd. Proceed with DgEs as already had neg endoscopy for same problems in 2014  My greatest concern is too many providers attempting to treat the  problem  I reminded him today:  The standardized cough guidelines published in Chest by Lissa Morales in 2006 are still the best available and consist of a multiple step process (up to 12!) , not a single office visit,  and are intended  to address this problem logically,  with an alogrithm dependent on response to empiric treatment at  each progressive step  to determine a specific diagnosis with  minimal addtional testing needed. Therefore if adherence is an issue or can't be accurately verified,  it's very unlikely the standard evaluation and treatment will be successful here.    Furthermore, response to therapy (other than acute cough suppression, which should only be used short term with avoidance of narcotic containing cough syrups if possible), can be a gradual process for which the patient is not likely to  perceive immediate benefit.  Unlike going to an eye doctor where the best perscription is almost always the first one and is immediately effective, this is almost never the case in the management of chronic cough syndromes. Therefore the patient needs to commit up front to consistently adhere to recommendations  for up to 6 weeks of therapy directed at the likely underlying problem(s) before the response can be reasonably evaluated.   Call for MCT 12/24/18 if not all better for MCT next.   Always return  with all meds in hand using a trust but verify  approach to confirm accurate Medication  Reconciliation The principal here is that until we are certain that the  patients are doing what we've asked, it makes no sense to ask them to do more.    I had an extended discussion with the patient reviewing all relevant studies completed to date and  lasting 25 minutes of a 40  minute office visit addressing incapcitating non-specific  refractory respiratory symptoms of uncertain and potentially multiple  etiologies.   Each maintenance medication was reviewed in detail including most importantly the  difference between maintenance and prns and under what circumstances the prns are to be triggered using an action plan format that is not reflected in the computer generated alphabetically organized AVS.    Please see AVS for specific instructions unique to this office visit that I personally wrote and verbalized to the the pt in detail and then reviewed with pt  by my nurse highlighting any changes in therapy/plan of care  recommended at today's visit.

## 2018-12-20 NOTE — Telephone Encounter (Signed)
lmtcb for pt. Will discuss with patient before sending in a new rx

## 2018-12-21 ENCOUNTER — Other Ambulatory Visit: Payer: Self-pay | Admitting: Internal Medicine

## 2018-12-21 ENCOUNTER — Telehealth: Payer: Self-pay | Admitting: Internal Medicine

## 2018-12-21 ENCOUNTER — Ambulatory Visit
Admission: RE | Admit: 2018-12-21 | Discharge: 2018-12-21 | Disposition: A | Discharge status: Home / Self Care | Payer: 59 | Source: Ambulatory Visit | Attending: Internal Medicine | Admitting: Internal Medicine

## 2018-12-21 DIAGNOSIS — R05 Cough: Secondary | ICD-10-CM

## 2018-12-21 DIAGNOSIS — R053 Chronic cough: Secondary | ICD-10-CM

## 2018-12-21 DIAGNOSIS — K228 Other specified diseases of esophagus: Secondary | ICD-10-CM | POA: Diagnosis not present

## 2018-12-21 MED ORDER — RABEPRAZOLE SODIUM 20 MG PO TBEC: 20.0000 mg | DELAYED_RELEASE_TABLET | Freq: Every day | ORAL | 2 refills | Status: DC

## 2018-12-21 NOTE — Progress Notes (Signed)
Spoke with pt and notified of results per Dr. Wert. Pt verbalized understanding and denied any questions. 

## 2018-12-21 NOTE — Telephone Encounter (Signed)
Disability forms have been located.  I have made pt aware that disability forms have to go through [employer] and pt has a 25 dollar process fee. Pt states he will come back by our office and pick forms up and take them to [employer].  Pt has been provided with [employer] address. Forms have been placed up front for pick up.  Nothing further is needed.

## 2018-12-21 NOTE — Telephone Encounter (Signed)
Called pt and advised message from the provider. Pt understood and verbalized understanding. Nothing further is needed.    

## 2018-12-26 ENCOUNTER — Other Ambulatory Visit: Payer: Self-pay | Admitting: Internal Medicine

## 2018-12-26 DIAGNOSIS — R0989 Other specified symptoms and signs involving the circulatory and respiratory systems: Secondary | ICD-10-CM

## 2018-12-27 ENCOUNTER — Ambulatory Visit (HOSPITAL_COMMUNITY): Payer: 59

## 2018-12-28 ENCOUNTER — Ambulatory Visit: Payer: 59 | Admitting: Internal Medicine

## 2018-12-28 ENCOUNTER — Encounter: Payer: Self-pay | Admitting: Internal Medicine

## 2018-12-28 ENCOUNTER — Ambulatory Visit (INDEPENDENT_AMBULATORY_CARE_PROVIDER_SITE_OTHER): Payer: 59 | Admitting: Internal Medicine

## 2018-12-28 VITALS — BP 112/78 | HR 60 | Ht 69.0 in | Wt 175.0 lb

## 2018-12-28 DIAGNOSIS — K219 Gastro-esophageal reflux disease without esophagitis: Secondary | ICD-10-CM | POA: Diagnosis not present

## 2018-12-28 DIAGNOSIS — R05 Cough: Secondary | ICD-10-CM | POA: Diagnosis not present

## 2018-12-28 DIAGNOSIS — R053 Chronic cough: Secondary | ICD-10-CM

## 2018-12-28 NOTE — Progress Notes (Signed)
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  Not able to work since Oct 2019 on [employer] disability for up to 2 years   Also c/o chronic  abd pain started before the flare cough x several years and never able to stop tums s flare even on max gerd rx. Last egd neg 09/08/17   Original pulmonary eval 01/2013 cough improved on gerd rx/ cyclical cough regimen but persistent throat tickle and abd pain so referred to [place], [place] in  [place] at that point and the problem never resolved per pt.     10/24/2018  Pulmonary/ 1st office eval/[facility] Brooks  Chief Complaint  Patient presents with  . Pulmonary Consult    Referred by Dr. Oneta Brooks. Pt c/o cough since Oct 2019. He occ produces clear to brown sputum. Cough tends to be worse at night. Talking alot and laughing can trigger the cough. He has a rescue inhaler that he rarely uses.   breathing is fine unless coughing saba no better  Worse p supper and before bed and does ok sleeping  Cough attributed to tickle in throat tenormin started Sept 2019 but down to 25 mg daily Kouffman Reflux v Neurogenic Cough Differentiator Reflux Comments  Do you awaken from a sound sleep coughing violently?                            With trouble breathing? no   Do you have choking episodes when you cannot  Get enough air, gasping for air ?              Yes    Do you usually cough when you lie down  into  The bed, or when you just lie down to rest ?                          No    Do you usually cough after meals or eating?         no   Do you cough when (or after) you bend over?    no   GERD SCORE     Kouffman Reflux v Neurogenic Cough Differentiator Neurogenic   Do you more-or-less cough all day long? sporadic   Does change of temperature make you cough? Cool worse   Does laughing or chuckling cause you to cough? yes   Do fumes (perfume, automobile fumes, burned  Toast, etc.,) cause you to cough ?      no   Does speaking, singing, or talking on the phone cause you to cough   ?               talking    Neurogenic/Airway score    rec First take delsym two tsp every  12 hours and supplement if needed with  Tylenol #3  up to 2 every 4 hours to suppress the urge to cough at all or even clear your throat. Prednisone 10 mg take  4 each am x 2 days,   2 each am x 2 days,  1 each am x 2 days and stop (this is to eliminate allergies and inflammation from coughing) Nexium  Take 30-60 min before first and last  Meals of the day and Pepcid 20 mg one bedtime plus chlorpheniramine 4 mg (chlortab at [facility])  x 2 at bedtime (both available over the counter)  until cough is completely gone for at least a week GERD  Diet  If not 100% better > return with all meds in hand    12/18/2018  f/u ov/Jay Brooks re: cough x Oct 2019  Now on BREO / did not bring meds  Chief Complaint  Patient presents with  . Follow-up    Cough still bothering him- non prod.   Dyspnea:  Breathing fine as long as not coughing  Cough: day time/ dry severe to point of gag/ vomit Sleeping: no problem at all with cough at hs but chronically sleeps very poorly  Has to take tums to keep from abd and vomiting  Finds sipping water or lozenges help as much as anything else  Can't tol h1 during the day  No better on singulair in past, nor saba, and now no better on breo or pred just finished today  rec The key to effective treatment for  your cough is eliminating the non-stop cycle of cough  Gabapentin 300 mg three times daily  First take delsym two tsp every 12 hours and supplement if needed with  Tylenol #3  up to 1- 2 every 4 hours to suppress the urge to cough at all or even clear your throat.  Once you have eliminated the cough for 3 straight days try reducing the tylenol #3  first,  then the delsym as tolerated   Prednisone 10 mg take  4 each am x 2 days,   2 each am x 2 days,  1 each am x 2 days and stop (this is to eliminate allergies and inflammation from coughing) Aciphex 20 mg Take 30- 60 min before your first and last meals of the day and Pepcid 40 mg after supper  GERD (REFLUX)  is an extremely common cause of respiratory symptoms just like yours , many times with no obvious heartburn at all.  Please see patient coordinator before you leave today  to schedule  DgEs  Next  Call me 12/24/18 if not cough better as you will need a methacholine challenge      12/28/2018  f/u ov/Jay Brooks re: cough x 0ct 2019/ throat clearing since ???  Dyspnea:  No aerobics but otherwise active Cough: better so stopped gabapentin 12/25/18 ( this was not the rec) still sense of pnds  No longer taking h1 at  All but did feel they helped the pnd  Sleeping: bed block blocks x 3 in SABA use: no inhalers    No obvious day to day or daytime variability or assoc excess/ purulent sputum or mucus plugs or hemoptysis or cp or chest tightness, subjective wheeze or overt sinus or hb symptoms.   Sleeping as above without nocturnal  or early am exacerbation  of respiratory  c/o's or need for noct saba. Also denies any obvious fluctuation of symptoms with weather or environmental changes or other aggravating or  alleviating factors except as outlined above   No unusual exposure hx or h/o childhood pna/ asthma or knowledge of premature birth.  Current Allergies, Complete Past Medical History, Past Surgical History, Family History, and Social History were  reviewed in [facility] record.  ROS  The following are not active complaints unless bolded Hoarseness, sore throat, dysphagia, dental problems, itching, sneezing,  nasal congestion or discharge of excess mucus or purulent secretions, ear ache,   fever, chills, sweats, unintended wt loss or wt gain, classically pleuritic or exertional cp,  orthopnea pnd or arm/hand swelling  or leg swelling, presyncope, palpitations,   upper mildline abdominal pain (responds to tums 3-6 per day x years) not related to meals/bms , anorexia, nausea, vomiting, diarrhea  or change in bowel habits or change in bladder habits, change in stools or change in urine, dysuria, hematuria,  rash, arthralgias, visual complaints, headache, numbness, weakness or ataxia or problems with walking or coordination,  change in mood or  Memory. Brain fog comes and goes x for a while not correlating with any one med        Current Meds  Medication Sig  . esomeprazole (NEXIUM) 20 MG capsule Take 40 mg by mouth 2 (two) times daily before a meal.  . famotidine (PEPCID) 20 MG tablet 2 after supper  . testosterone cypionate (DEPOTESTOSTERONE CYPIONATE) 200 MG/ML injection INJECT INTO THE MUSCLE EVERY 2 WEEKS      Objective:     amb wm still clearing throat though not as severe / bland affect   12/28/2018       175   12/18/18 174 lb (78.9 kg)  12/10/18 175 lb (79.4 kg)  11/16/18 173 lb 3.2 oz (78.6 kg)    Vital signs reviewed - Note on arrival 02 sats  96% on RA      HEENT: nl dentition,  and oropharynx. Nl external ear canals without cough reflex - mod non-specific turbinate edema   NECK :  without JVD/Nodes/TM/ nl carotid upstrokes bilaterally   LUNGS: no acc muscle use,  Nl contour chest which is clear to A and P bilaterally without cough on insp or exp maneuvers   CV:  RRR  no s3 or murmur or increase in P2, and no edema   ABD:  soft and nontender with nl inspiratory excursion in the supine  position. No bruits or organomegaly appreciated, bowel sounds nl  MS:  Nl gait/ ext warm without deformities, calf tenderness, cyanosis or clubbing No obvious joint restrictions   SKIN: warm and dry without lesions    NEURO:  alert, approp, nl sensorium with  no motor or cerebellar deficits apparent.           Assessment

## 2018-12-28 NOTE — Patient Instructions (Addendum)
Gabapentin 300 mg three times a day until you return and consider then referral to [place] re abd burning that requires tums up to 6 x times and not affected by eating.   Continue nexium 20 mg x 2   Take 30- 60 min before your first and last meals of the day   Famotadine 20 mg one after supper and one at bedtime   Please schedule a follow up office visit in 6 weeks, call sooner if needed  - to [facility] voice center if not satisfied with cough control on gabapentin 300 tid

## 2018-12-30 ENCOUNTER — Encounter: Payer: Self-pay | Admitting: Internal Medicine

## 2018-12-30 NOTE — Assessment & Plan Note (Addendum)
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 No evidence of interstitial lung disease. 2. Scattered tiny subpleural pulmonary nodules at the peripheral right lung base, largest 3 mm. No follow-up needed if patient is low-risk = never smoker, no known primary > no f/u needed  - Repeat cyclical cough rx 6/81/1572 and add gabapentin 300 tid > did not continue once improved and still with throat clearing > restarted 12/28/2018    Clearly this is uacs and not asthma.  Upper airway cough syndrome (previously labeled PNDS),  is so named because it's frequently impossible to sort out how much is  CR/sinusitis with freq throat clearing (which can be related to primary GERD)   vs  causing  secondary (" extra esophageal")  GERD from wide swings in gastric pressure that occur with throat clearing, often  promoting self use of mint and menthol lozenges that reduce the lower esophageal sphincter tone and exacerbate the problem further in a cyclical fashion.   These are the same pts (now being labeled as having "irritable larynx syndrome" by some cough centers) who not infrequently have a history of having failed to tolerate ace inhibitors,  dry powder inhalers or biphosphonates or report having atypical/extraesophageal reflux symptoms that don't respond to standard doses of PPI  and are easily confused as having aecopd or asthma flares by even experienced allergists/ pulmonologists (myself included).    rec max rx for gerd/ cyclical cough with gabapentin 300 mg tid and if not better to his satisfaction > next step is eval by [facility]/ Dr Joya Gaskins.    I had an extended discussion with the patient reviewing all relevant studies completed to date and  lasting 15 to 20 minutes of a 25 minute visit    Each maintenance medication  was reviewed in detail including most importantly the difference between maintenance and prns and under what circumstances the prns are to be triggered using an action plan format that is not reflected in the computer generated alphabetically organized AVS.     Please see AVS for specific instructions unique to this visit that I personally wrote and verbalized to the the pt in detail and then reviewed with pt  by my nurse highlighting any  changes in therapy recommended at today's visit to their plan of care.

## 2018-12-30 NOTE — Assessment & Plan Note (Signed)
DgEs 12/21/2018  No evidence for hiatal hernia. No episodes of gastroesophageal reflux were observed during the study (did not cough during study per pt) . Disruption of primary peristalsis on some swallows consistent with nonspecific esophageal motility disorder.    He continues to have sense of gastric burning and nause / vomiting that are only controlled with tums - vomiting is the most severe form of reflux I know and this needs to be addressed by Dr Ardis Hughs > will message him directly and ask pt to make appt.

## 2019-01-02 ENCOUNTER — Ambulatory Visit (INDEPENDENT_AMBULATORY_CARE_PROVIDER_SITE_OTHER): Payer: 59 | Admitting: Gastroenterology

## 2019-01-02 ENCOUNTER — Encounter: Payer: Self-pay | Admitting: Gastroenterology

## 2019-01-02 VITALS — BP 122/78 | HR 68 | Ht 69.0 in | Wt 176.0 lb

## 2019-01-02 DIAGNOSIS — K219 Gastro-esophageal reflux disease without esophagitis: Secondary | ICD-10-CM | POA: Diagnosis not present

## 2019-01-02 DIAGNOSIS — R05 Cough: Secondary | ICD-10-CM | POA: Diagnosis not present

## 2019-01-02 DIAGNOSIS — R059 Cough, unspecified: Secondary | ICD-10-CM

## 2019-01-02 NOTE — Progress Notes (Signed)
Review of pertinent gastrointestinal problems: 1. Routine risk for crc, colonoscopy 12/2010 found diverticulosis only, no polyps. Recommended recall colonoscopy in 10 years. 2. GERD: 6/14 EGD Dr. Ardis Hughs found mild non specific gastritis. Biopsies neg for H. Pylori. 2016 US done for dyspepsia, was normal.  November 2019 ENT evaluation, laryngoscope he suggested some slight irritation, possibly related to reflux. 3.  Acute diverticulitis, clinically mild, proven by CT scan November 2018 "mild diverticulitis at the rectosigmoid junction" improved with 10-day oral antibiotic course.    HPI: This is a very pleasant 58 year old man whom I last saw 2 or 3 years ago actually.  Since then he has seen a couple different extenders here in our office.    He started off the visit by telling me that his "cough has gotten better".  Not as often, only starts if he "is laughing or talking a lot."  Can have a wheezing sound with the cough.  Cough started to improve in January.  Currently he is on aciphex 20mg  pills shortly before BF and dinner. Also taking pepcid 40mg  BID at the same times.  Takes TUMS a few times a week now.  He has burning, rumbling in his stomach.  Has fairly constant nausea  Overall stable twice.  No dysphagia  Yesterday he had diarrheal illness.   OFFICE note from Dr. Melvyn Novas last week: 12/28/2018  f/u ov/Wert re: cough x 0ct 2019/ throat clearing since ???  Dyspnea:  No aerobics but otherwise active Cough: better so stopped gabapentin 12/25/18 ( this was not the rec) still sense of pnds  No longer taking h1 at  All but did feel they helped the pnd  Sleeping: bed block blocks x 3 in SABA use: no inhalers   Chief complaint is chronic cough, chronic GERD  ROS: complete GI ROS as described in HPI, all other review negative.  Constitutional:  No unintentional weight loss   Past Medical History:  Diagnosis Date  . Allergy   . Chronic sinusitis   . Diverticulosis of colon  (without mention of hemorrhage)   . GERD (gastroesophageal reflux disease)   . Lyme disease   . Sleep deficient     Past Surgical History:  Procedure Laterality Date  . COLONOSCOPY    . HERNIA REPAIR    . UPPER GASTROINTESTINAL ENDOSCOPY    . VASECTOMY      Current Outpatient Medications  Medication Sig Dispense Refill  . acetaminophen-codeine (TYLENOL #3) 300-30 MG tablet Take 1-2 tablets by mouth every 4 (four) hours as needed for moderate pain.    Marland Kitchen atenolol (TENORMIN) 25 MG tablet Take 25 mg by mouth daily.    Marland Kitchen esomeprazole (NEXIUM) 20 MG capsule Take 40 mg by mouth 2 (two) times daily before a meal.    . famotidine (PEPCID) 20 MG tablet 2 after supper 60 tablet 11  . fluticasone (FLONASE) 50 MCG/ACT nasal spray Place 1 spray into both nostrils daily.    Marland Kitchen gabapentin (NEURONTIN) 300 MG capsule Take 300 mg by mouth 3 (three) times daily.    . RABEprazole (ACIPHEX) 20 MG tablet Take 20 mg by mouth daily.    . tamsulosin (FLOMAX) 0.4 MG CAPS capsule Take 0.4 mg by mouth daily after supper.    . testosterone cypionate (DEPOTESTOSTERONE CYPIONATE) 200 MG/ML injection INJECT 2ML INTO THE MUSCLE EVERY 2 WEEKS 10 mL 2   Current Facility-Administered Medications  Medication Dose Route Frequency Provider Last Rate Last Dose  . testosterone cypionate (DEPOTESTOSTERONE CYPIONATE) injection 200 mg  200 mg Intramuscular Q14 Days Vicie Mutters, PA-C      . testosterone cypionate (DEPOTESTOSTERONE CYPIONATE) injection 200 mg  200 mg Intramuscular Q14 Days Vicie Mutters, PA-C   200 mg at 12/10/18 1009    Allergies as of 01/02/2019 - Review Complete 01/02/2019  Allergen Reaction Noted  . Allegra [fexofenadine]  10/22/2014  . Doxycycline Nausea Only 12/30/2014  . Levaquin [levofloxacin] Nausea And Vomiting 09/08/2014  . Other  12/06/2012    Family History  Problem Relation Age of Onset  . Breast cancer Maternal Grandmother   . Colon polyps Brother   . Colon polyps Maternal Uncle    . Cancer Mother 63       pancreatic  . Colon cancer Neg Hx   . Esophageal cancer Neg Hx   . Stomach cancer Neg Hx   . Ulcerative colitis Neg Hx     Social History   Socioeconomic History  . Marital status: Married    Spouse name: Not on file  . Number of children: 1  . Years of education: Not on file  . Highest education level: Not on file  Occupational History  . Occupation: PILOT    Employer: [employer]  Social Needs  . Financial resource strain: Not on file  . Food insecurity:    Worry: Not on file    Inability: Not on file  . Transportation needs:    Medical: Not on file    Non-medical: Not on file  Tobacco Use  . Smoking status: Never Smoker  . Smokeless tobacco: Never Used  Substance and Sexual Activity  . Alcohol use: Yes    Comment: occ  . Drug use: No  . Sexual activity: Not on file  Lifestyle  . Physical activity:    Days per week: Not on file    Minutes per session: Not on file  . Stress: Not on file  Relationships  . Social connections:    Talks on phone: Not on file    Gets together: Not on file    Attends religious service: Not on file    Active member of club or organization: Not on file    Attends meetings of clubs or organizations: Not on file    Relationship status: Not on file  . Intimate partner violence:    Fear of current or ex partner: Not on file    Emotionally abused: Not on file    Physically abused: Not on file    Forced sexual activity: Not on file  Other Topics Concern  . Not on file  Social History Narrative   Daily caffeine      Physical Exam: BP 122/78   Pulse 68   Ht 5\' 9"  (1.753 m)   Wt 176 lb (79.8 kg)   BMI 25.99 kg/m  Constitutional: generally well-appearing Psychiatric: alert and oriented x3 Abdomen: soft, nontender, nondistended, no obvious ascites, no peritoneal signs, normal bowel sounds No peripheral edema noted in lower extremities  Assessment and plan: 58 y.o. male with chronic cough, chronic  GERD  It is often very difficult to know if acid or GERD is contributing to a cough.  He started out today's visit by telling me that his "cough has gotten a lot better.  It only starts if I laugh or talk a lot."  He believes recent change to AcipHex or recent start of gabapentin may be the reason he is improving.  Currently his antiacid regimen is AcipHex 20 mg shortly before breakfast and shortly  before dinner.  At the same time he takes Pepcid 40 mg (shortly before breakfast and shortly before dinner).  I explained that I think the H2 blocker is best taken at bedtime instead of with his proton pump inhibitor.  I recommended repeat EGD to check for acid related damage in his esophagus and if no clear acid damage is noted I would place a Bravo 48-hour wireless pH probe.  This will be done while he continues to take his antiacid medicines.  Please see the "Patient Instructions" section for addition details about the plan.  Owens Loffler, MD [facility] 01/02/2019, 3:44 PM

## 2019-01-02 NOTE — Patient Instructions (Addendum)
EGD, same day Bravo pH test probe placement (while on antiacid meds). Stay on his meds throughout (aciphex one pill before BF and dinner meals; pepcid 20 or 40 mg at bedtime nightly).  Thank you for entrusting me with your care and choosing [facility].  Dr Ardis Hughs

## 2019-01-03 ENCOUNTER — Telehealth: Payer: Self-pay | Admitting: Internal Medicine

## 2019-01-03 NOTE — Telephone Encounter (Signed)
done

## 2019-01-03 NOTE — Telephone Encounter (Signed)
Jay Brooks did MW give you the forms?

## 2019-01-03 NOTE — Telephone Encounter (Signed)
Forms done and given to Daneil Dan to give to [facility]

## 2019-01-03 NOTE — Telephone Encounter (Signed)
Placed in Dr Wert's lookat 

## 2019-01-04 NOTE — Telephone Encounter (Signed)
Fwd to [facility] via interoffice mail -pr  °

## 2019-01-07 ENCOUNTER — Other Ambulatory Visit: Payer: Self-pay | Admitting: Gastroenterology

## 2019-01-07 NOTE — Telephone Encounter (Signed)
Pt would like to get a refill for a med that we did not proscribe to him. States that spoke to Dr. Ardis Hughs was going to proscrible it to him. Med is ACIPHEX  30mg  2x a day To send to [facility] [address]

## 2019-01-08 MED ORDER — RABEPRAZOLE SODIUM 20 MG PO TBEC: 20.0000 mg | DELAYED_RELEASE_TABLET | Freq: Every day | ORAL | 11 refills | Status: DC

## 2019-01-08 NOTE — Telephone Encounter (Signed)
Prescription sent to patient's pharmacy and patient notified.  

## 2019-01-09 ENCOUNTER — Other Ambulatory Visit: Payer: Self-pay | Admitting: Gastroenterology

## 2019-01-09 MED ORDER — RABEPRAZOLE SODIUM 20 MG PO TBEC: 20.0000 mg | DELAYED_RELEASE_TABLET | Freq: Two times a day (BID) | ORAL | 3 refills | Status: DC

## 2019-01-09 NOTE — Telephone Encounter (Signed)
Pt called to inform that refill for rabeprazole was sent for the wrong dose. Pt states that he takes 1 BID.

## 2019-01-09 NOTE — Telephone Encounter (Signed)
Sent Acipex twice daily to [facility] for patient

## 2019-01-11 ENCOUNTER — Telehealth: Payer: Self-pay | Admitting: Gastroenterology

## 2019-01-11 MED ORDER — METRONIDAZOLE 500 MG PO TABS: 500.0000 mg | ORAL_TABLET | Freq: Two times a day (BID) | ORAL | 0 refills | Status: AC

## 2019-01-11 MED ORDER — CIPROFLOXACIN HCL 500 MG PO TABS: 500.0000 mg | ORAL_TABLET | Freq: Two times a day (BID) | ORAL | 0 refills | Status: AC

## 2019-01-11 NOTE — Telephone Encounter (Signed)
Lower left side abd pain, just below the waist, pain started 3 days ago and has progressively gotten worse. No rectal bleeding, no fever, has some constipation with smaller stools.  No nausea. Had an episode of diverticulitis in 09/2017.  He states this pain is similar to that episode.  Dr Havery Moros you are Doc of the day can you review for Dr Ardis Hughs?

## 2019-01-11 NOTE — Telephone Encounter (Signed)
If he thinks symptoms are similar to diverticulitis in the past, would treat him for that as we are heading into the weekend. Can you please give cipro 500mg  BID and flagyl 500mg  TID for one week supply and see if that helps. If worsening pain over the weekend or this is not helping may need to go to the ED or urgent care. Thanks

## 2019-01-11 NOTE — Telephone Encounter (Signed)
The patient has been notified of this information and all questions answered.   We have sent medications to your pharmacy for pick.

## 2019-01-14 DIAGNOSIS — R3915 Urgency of urination: Secondary | ICD-10-CM | POA: Diagnosis not present

## 2019-01-14 DIAGNOSIS — R350 Frequency of micturition: Secondary | ICD-10-CM | POA: Diagnosis not present

## 2019-01-14 DIAGNOSIS — N200 Calculus of kidney: Secondary | ICD-10-CM | POA: Diagnosis not present

## 2019-01-15 ENCOUNTER — Telehealth: Payer: Self-pay | Admitting: Internal Medicine

## 2019-01-15 DIAGNOSIS — R053 Chronic cough: Secondary | ICD-10-CM

## 2019-01-15 DIAGNOSIS — R05 Cough: Secondary | ICD-10-CM

## 2019-01-15 NOTE — Telephone Encounter (Signed)
Called and spoke with patient he stated that he was in to see MW a few weeks ago and he was to schedule an upper GI with his doctor. He has it for the end of march. Pending the outcome of the procedure, MW wanted to wait on the results and see if the patient needs to see the ENT for a throat specialist. He wants to know if he should go ahead and schedule the appointment or wait on the results from the upper GI.    MW please advise, thank you.

## 2019-01-15 NOTE — Telephone Encounter (Signed)
Yes refer to Dr Bettina Gavia but pt mus take all active meds with him to [facility]

## 2019-01-16 NOTE — Telephone Encounter (Signed)
Called and spoke with patient, he is aware and verbalized understanding of response from MW below. Referral has been placed. Nothing further needed.

## 2019-01-18 ENCOUNTER — Ambulatory Visit (INDEPENDENT_AMBULATORY_CARE_PROVIDER_SITE_OTHER): Payer: 59

## 2019-01-18 ENCOUNTER — Other Ambulatory Visit: Payer: Self-pay

## 2019-01-18 VITALS — BP 106/68 | HR 58 | Temp 97.7°F | Wt 174.0 lb

## 2019-01-18 DIAGNOSIS — E291 Testicular hypofunction: Secondary | ICD-10-CM

## 2019-01-18 MED ORDER — TESTOSTERONE CYPIONATE 200 MG/ML IM SOLN
200.0000 mg | Freq: Once | INTRAMUSCULAR | Status: AC
  Administered 2019-01-18: 200 mg via INTRAMUSCULAR

## 2019-01-18 NOTE — Progress Notes (Signed)
Patient here for a NV for his Testosterone Cypionate 200 mg/ml 2 ml IMRIGHTupper outer quadrant injection. Patient tolerated well and will return in 3 weeks for his next injection 

## 2019-01-24 ENCOUNTER — Telehealth: Payer: Self-pay | Admitting: Gastroenterology

## 2019-01-24 NOTE — Telephone Encounter (Signed)
Please call him to postpone his next week EGD, bravo at [facility] (Thursday).  Please explain to the patient that this decision is related to COVID-19 preparedness/precautions.   Rebook it for 6 weeks from now on a Thursday.    thanks

## 2019-01-24 NOTE — Telephone Encounter (Signed)
Pt aware and pt put on wait list to call to set up appt in about 6 weeks.

## 2019-01-29 ENCOUNTER — Other Ambulatory Visit: Payer: Self-pay | Admitting: Internal Medicine

## 2019-01-30 ENCOUNTER — Other Ambulatory Visit: Payer: Self-pay

## 2019-01-30 ENCOUNTER — Encounter: Payer: Self-pay | Admitting: Adult Health

## 2019-01-30 ENCOUNTER — Ambulatory Visit (INDEPENDENT_AMBULATORY_CARE_PROVIDER_SITE_OTHER): Payer: 59 | Admitting: Adult Health

## 2019-01-30 VITALS — BP 110/78 | HR 76 | Temp 97.9°F | Ht 69.0 in | Wt 178.4 lb

## 2019-01-30 DIAGNOSIS — R0989 Other specified symptoms and signs involving the circulatory and respiratory systems: Secondary | ICD-10-CM

## 2019-01-30 DIAGNOSIS — E291 Testicular hypofunction: Secondary | ICD-10-CM

## 2019-01-30 DIAGNOSIS — K219 Gastro-esophageal reflux disease without esophagitis: Secondary | ICD-10-CM | POA: Diagnosis not present

## 2019-01-30 DIAGNOSIS — E559 Vitamin D deficiency, unspecified: Secondary | ICD-10-CM | POA: Diagnosis not present

## 2019-01-30 DIAGNOSIS — Z79899 Other long term (current) drug therapy: Secondary | ICD-10-CM | POA: Diagnosis not present

## 2019-01-30 DIAGNOSIS — E782 Mixed hyperlipidemia: Secondary | ICD-10-CM | POA: Diagnosis not present

## 2019-01-30 DIAGNOSIS — R7309 Other abnormal glucose: Secondary | ICD-10-CM

## 2019-01-30 DIAGNOSIS — R053 Chronic cough: Secondary | ICD-10-CM

## 2019-01-30 DIAGNOSIS — R05 Cough: Secondary | ICD-10-CM

## 2019-01-30 DIAGNOSIS — Z6825 Body mass index (BMI) 25.0-25.9, adult: Secondary | ICD-10-CM

## 2019-01-30 NOTE — Patient Instructions (Addendum)
Goals    . Exercise 150 min/wk Moderate Activity       Please monitor abdominal symptoms and message back if persisting, or with any new symptoms   Diverticulosis  Diverticulosis is a condition that develops when small pouches (diverticula) form in the wall of the large intestine (colon). The colon is where water is absorbed and stool is formed. The pouches form when the inside layer of the colon pushes through weak spots in the outer layers of the colon. You may have a few pouches or many of them. What are the causes? The cause of this condition is not known. What increases the risk? The following factors may make you more likely to develop this condition:  Being older than age 3. Your risk for this condition increases with age. Diverticulosis is rare among people younger than age 67. By age 83, many people have it.  Eating a low-fiber diet.  Having frequent constipation.  Being overweight.  Not getting enough exercise.  Smoking.  Taking over-the-counter pain medicines, like aspirin and ibuprofen.  Having a family history of diverticulosis. What are the signs or symptoms? In most people, there are no symptoms of this condition. If you do have symptoms, they may include:  Bloating.  Cramps in the abdomen.  Constipation or diarrhea.  Pain in the lower left side of the abdomen. How is this diagnosed? This condition is most often diagnosed during an exam for other colon problems. Because diverticulosis usually has no symptoms, it often cannot be diagnosed independently. This condition may be diagnosed by:  Using a flexible scope to examine the colon (colonoscopy).  Taking an X-ray of the colon after dye has been put into the colon (barium enema).  Doing a CT scan. How is this treated? You may not need treatment for this condition if you have never developed an infection related to diverticulosis. If you have had an infection before, treatment may include:  Eating a  high-fiber diet. This may include eating more fruits, vegetables, and grains.  Taking a fiber supplement.  Taking a live bacteria supplement (probiotic).  Taking medicine to relax your colon.  Taking antibiotic medicines. Follow these instructions at home:  Drink 6-8 glasses of water or more each day to prevent constipation.  Try not to strain when you have a bowel movement.  If you have had an infection before: ? Eat more fiber as directed by your health care provider or your diet and nutrition specialist (dietitian). ? Take a fiber supplement or probiotic, if your health care provider approves.  Take over-the-counter and prescription medicines only as told by your health care provider.  If you were prescribed an antibiotic, take it as told by your health care provider. Do not stop taking the antibiotic even if you start to feel better.  Keep all follow-up visits as told by your health care provider. This is important. Contact a health care provider if:  You have pain in your abdomen.  You have bloating.  You have cramps.  You have not had a bowel movement in 3 days. Get help right away if:  Your pain gets worse.  Your bloating becomes very bad.  You have a fever or chills, and your symptoms suddenly get worse.  You vomit.  You have bowel movements that are bloody or black.  You have bleeding from your rectum. Summary  Diverticulosis is a condition that develops when small pouches (diverticula) form in the wall of the large intestine (colon).  You  may have a few pouches or many of them.  This condition is most often diagnosed during an exam for other colon problems.  If you have had an infection related to diverticulosis, treatment may include increasing the fiber in your diet, taking supplements, or taking medicines. This information is not intended to replace advice given to you by your health care provider. Make sure you discuss any questions you have with  your health care provider. Document Released: 07/21/2004 Document Revised: 09/12/2016 Document Reviewed: 09/12/2016 Elsevier Interactive Patient Education  2019 [employer].

## 2019-01-30 NOTE — Progress Notes (Signed)
FOLLOW UP  Assessment and Plan:   Hypertension Well controlled with current medications  Monitor blood pressure at home; patient to call if consistently greater than 130/80 Continue DASH diet.   Reminder to go to the ER if any CP, SOB, nausea, dizziness, severe HA, changes vision/speech, left arm numbness and tingling and jaw pain.  Cholesterol Currently at goal by lifestyle modification;  Continue low cholesterol diet and exercise.  Check lipid panel.   Other abnormal glucose Recent A1Cs at goal Discussed diet/exercise, weight management  Defer A1C; check CMP  BMI 25 Continue to recommend diet heavy in fruits and veggies and low in animal meats, cheeses, and dairy products, appropriate calorie intake Discuss exercise recommendations routinely Continue to monitor weight at each visit  Vitamin D Def Below goal at last visit; continue supplementation to maintain goal of 70-100 Defer Vit D level  Testosterone deficiency - continue replacement therapy, check testosterone levels as needed.    Continue diet and meds as discussed. Further disposition pending results of labs. Discussed med's effects and SE's.   Over 30 minutes of exam, counseling, chart review, and critical decision making was performed.   Future Appointments  Date Time Provider [place]  05/02/2019  9:15 AM Tanda Rockers, MD [facility] None  08/14/2019  9:00 AM Unk Pinto, MD [facility] None    ----------------------------------------------------------------------------------------------------------------------  HPI 58 y.o. male  presents for 3 month follow up on hypertension, cholesterol, glucose management, weight, hypogonadism and vitamin D deficiency.   He has chronic cough; Seeing Dr. Melvyn Novas; Saw allergist, Dr Jerilynn Mages.Orvil Feil  w/ Allergy tests which resulted allergic to grass & pollens. She rx'd an albuterol inhaler and advised him to stop his Singulair. She did not recommend allergy shots.  Also, he communicated with Dr Ardis Hughs' office (GI) & with suspicion of laryngeal reflux causing cough, he was Rx'd Nexium 40 mg & Pepcid 40 mg - both bid. 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 He felt Tessalon perles didn't help and he had some type of dysphoria with Allegra. He is not aware of wheezing. He is pending EGD with Dr. Ardis Hughs though this has been postponed in light of non-urgent procedure with covid 19 outbreak.   He is recovering from diverticulitis flare; completed cipro/flagyl 2.5 weeks ago; was doing well until 3-4 days ago, having some intermittent localized LLQ tenderness. Denies fever/chills, mild nausea, denies diarrhea/constipations recently. Taking a stool softener daily. Last pain felt last evening, not associated with eating, BMs, food intake. Seems to be improving.    BMI is Body mass index is 26.35 kg/m., he has not been working on diet and exercise,  Wt Readings from Last 3 Encounters:  01/30/19 178 lb 6.4 oz (80.9 kg)  01/18/19 174 lb (78.9 kg)  01/02/19 176 lb (79.8 kg)    His blood pressure has been controlled at home, today their BP is BP: 110/78  He does not workout. He denies chest pain, shortness of breath, dizziness.   He is not on cholesterol medication. His cholesterol is at goal. The cholesterol last visit was:   Lab Results  Component Value Date   CHOL 135 07/30/2018   HDL 39 (L) 07/30/2018   LDLCALC 74 07/30/2018   TRIG 134 07/30/2018   CHOLHDL 3.5 07/30/2018    He has not been working on diet and exercise for glucose management, and denies increased appetite, nausea, paresthesia of the feet, polydipsia and  polyuria. Last A1C in the office was:  Lab Results  Component Value Date   HGBA1C 5.1 07/30/2018   Patient is on Vitamin D supplement but has remained below goal of 60-100; he did not increase dose, taking more frequently:    Lab Results  Component Value  Date   VD25OH 15 07/30/2018     He has a history of testosterone deficiency and is on testosterone replacement, taking 400 mg every 3 weeks, last injection was 1 week ago. He states that the testosterone helps with his energy, libido, muscle mass. Lab Results  Component Value Date   TESTOSTERONE 510 07/30/2018     Current Medications:  Current Outpatient Medications on File Prior to Visit  Medication Sig  . atenolol (TENORMIN) 25 MG tablet Take 25 mg by mouth daily.  . clindamycin (CLEOCIN T) 1 % lotion Apply 1 application topically 2 (two) times daily as needed (applied to scalp as needed for folliculitis).  . famotidine (PEPCID) 20 MG tablet 2 after supper (Patient taking differently: Take 20 mg by mouth at bedtime. )  . fluticasone (FLONASE) 50 MCG/ACT nasal spray Place 1 spray into both nostrils daily.  Marland Kitchen gabapentin (NEURONTIN) 300 MG capsule Take 300 mg by mouth 3 (three) times daily.  . RABEprazole (ACIPHEX) 20 MG tablet Take 1 tablet (20 mg total) by mouth 2 (two) times daily.  . tamsulosin (FLOMAX) 0.4 MG CAPS capsule TAKE 1 CAPSULE (0.4 MG TOTAL) BY MOUTH DAILY AFTER SUPPER.  Marland Kitchen testosterone cypionate (DEPOTESTOSTERONE CYPIONATE) 200 MG/ML injection INJECT 2ML INTO THE MUSCLE EVERY 2 WEEKS (Patient taking differently: Inject 400 mg into the muscle every 21 ( twenty-one) days. )   Current Facility-Administered Medications on File Prior to Visit  Medication  . testosterone cypionate (DEPOTESTOSTERONE CYPIONATE) injection 200 mg  . testosterone cypionate (DEPOTESTOSTERONE CYPIONATE) injection 200 mg     Allergies:  Allergies  Allergen Reactions  . Allegra [Fexofenadine] Other (See Comments)    insomnia  . Doxycycline Nausea Only  . Levaquin [Levofloxacin] Nausea And Vomiting    Patient states that after he took levaquin this past time he had nausea with vomiting  . Other     Benzol Peroxide- caused blisters     Medical History:  Past Medical History:  Diagnosis Date   . Allergy   . Chronic sinusitis   . Diverticulosis of colon (without mention of hemorrhage)   . GERD (gastroesophageal reflux disease)   . Lyme disease   . Sleep deficient    Family history- Reviewed and unchanged Social history- Reviewed and unchanged   Review of Systems:  Review of Systems  Constitutional: Negative for malaise/fatigue and weight loss.  HENT: Negative for hearing loss and tinnitus.   Eyes: Negative for blurred vision and double vision.  Respiratory: Positive for cough (chronic). Negative for hemoptysis, sputum production, shortness of breath and wheezing.   Cardiovascular: Negative for chest pain, palpitations, orthopnea, claudication and leg swelling.  Gastrointestinal: Negative for abdominal pain, blood in stool, constipation, diarrhea, heartburn, melena, nausea and vomiting.  Genitourinary: Negative.   Musculoskeletal: Negative for joint pain and myalgias.  Skin: Negative for rash.  Neurological: Negative for dizziness, tingling, sensory change, weakness and headaches.  Endo/Heme/Allergies: Negative for polydipsia.  Psychiatric/Behavioral: Negative.   All other systems reviewed and are negative.    Physical Exam: BP 110/78   Pulse 76   Temp 97.9 F (36.6 C)   Ht 5\' 9"  (1.753 m)   Wt 178 lb 6.4 oz (80.9 kg)  SpO2 98%   BMI 26.35 kg/m  Wt Readings from Last 3 Encounters:  01/30/19 178 lb 6.4 oz (80.9 kg)  01/18/19 174 lb (78.9 kg)  01/02/19 176 lb (79.8 kg)   General Appearance: Well nourished, in no apparent distress. Eyes: PERRLA, EOMs, conjunctiva no swelling or erythema Sinuses: No Frontal/maxillary tenderness ENT/Mouth: Ext aud canals clear, TMs without erythema, bulging. No erythema, swelling, or exudate on post pharynx.  Tonsils not swollen or erythematous. Hearing normal.  Neck: Supple, thyroid normal.  Respiratory: Respiratory effort normal, BS equal bilaterally without rales, rhonchi, wheezing or stridor.  Cardio: RRR with no MRGs.  Brisk peripheral pulses without edema.  Abdomen: Soft, + BS.  Non tender, no guarding, rebound, hernias, masses. Lymphatics: Non tender without lymphadenopathy.  Musculoskeletal: Full ROM, 5/5 strength, Normal gait Skin: Warm, dry without rashes, lesions, ecchymosis.  Neuro: Cranial nerves intact. No cerebellar symptoms.  Psych: Awake and oriented X 3, normal affect, Insight and Judgment appropriate.    Izora Ribas, NP 4:45 PM [facility]

## 2019-01-31 ENCOUNTER — Ambulatory Visit (HOSPITAL_COMMUNITY): Admission: RE | Admit: 2019-01-31 | Payer: 59 | Source: Home / Self Care | Admitting: Gastroenterology

## 2019-01-31 ENCOUNTER — Encounter (HOSPITAL_COMMUNITY): Admission: RE | Payer: Self-pay | Source: Home / Self Care

## 2019-01-31 LAB — CBC WITH DIFFERENTIAL/PLATELET
Absolute Monocytes: 856 cells/uL (ref 200–950)
Basophils Absolute: 30 cells/uL (ref 0–200)
Basophils Relative: 0.5 %
Eosinophils Absolute: 77 cells/uL (ref 15–500)
Eosinophils Relative: 1.3 %
HCT: 44.6 % (ref 38.5–50.0)
Hemoglobin: 15.6 g/dL (ref 13.2–17.1)
Lymphs Abs: 1410 cells/uL (ref 850–3900)
MCH: 32.6 pg (ref 27.0–33.0)
MCHC: 35 g/dL (ref 32.0–36.0)
MCV: 93.3 fL (ref 80.0–100.0)
MPV: 10 fL (ref 7.5–12.5)
Monocytes Relative: 14.5 %
Neutro Abs: 3528 cells/uL (ref 1500–7800)
Neutrophils Relative %: 59.8 %
Platelets: 184 10*3/uL (ref 140–400)
RBC: 4.78 10*6/uL (ref 4.20–5.80)
RDW: 12.2 % (ref 11.0–15.0)
Total Lymphocyte: 23.9 %
WBC: 5.9 10*3/uL (ref 3.8–10.8)

## 2019-01-31 LAB — COMPLETE METABOLIC PANEL WITH GFR
AG Ratio: 1.8 (calc) (ref 1.0–2.5)
ALT: 23 U/L (ref 9–46)
AST: 27 U/L (ref 10–35)
Albumin: 4.4 g/dL (ref 3.6–5.1)
Alkaline phosphatase (APISO): 40 U/L (ref 35–144)
BUN: 14 mg/dL (ref 7–25)
CO2: 28 mmol/L (ref 20–32)
Calcium: 9.1 mg/dL (ref 8.6–10.3)
Chloride: 101 mmol/L (ref 98–110)
Creat: 1.02 mg/dL (ref 0.70–1.33)
GFR, Est African American: 93 mL/min/{1.73_m2} (ref >=60)
GFR, Est Non African American: 80 mL/min/{1.73_m2} (ref >=60)
Globulin: 2.4 g/dL (calc) (ref 1.9–3.7)
Glucose, Bld: 88 mg/dL (ref 65–99)
Potassium: 4.1 mmol/L (ref 3.5–5.3)
Sodium: 137 mmol/L (ref 135–146)
Total Bilirubin: 0.5 mg/dL (ref 0.2–1.2)
Total Protein: 6.8 g/dL (ref 6.1–8.1)

## 2019-01-31 LAB — LIPID PANEL
Cholesterol: 160 mg/dL (ref <200)
HDL: 33 mg/dL — ABNORMAL LOW (ref >=40)
LDL Cholesterol (Calc): 90 mg/dL (calc)
Non-HDL Cholesterol (Calc): 127 mg/dL (calc) (ref <130)
Total CHOL/HDL Ratio: 4.8 (calc) (ref <5.0)
Triglycerides: 304 mg/dL — ABNORMAL HIGH (ref <150)

## 2019-01-31 LAB — TSH: TSH: 3.33 mIU/L (ref 0.40–4.50)

## 2019-01-31 LAB — MAGNESIUM: Magnesium: 1.9 mg/dL (ref 1.5–2.5)

## 2019-01-31 SURGERY — ESOPHAGOGASTRODUODENOSCOPY (EGD) WITH PROPOFOL
Anesthesia: Monitor Anesthesia Care

## 2019-02-01 ENCOUNTER — Ambulatory Visit: Payer: Self-pay | Admitting: Physician Assistant

## 2019-02-01 ENCOUNTER — Ambulatory Visit: Payer: Self-pay | Admitting: Adult Health

## 2019-02-08 ENCOUNTER — Ambulatory Visit: Payer: 59 | Admitting: Internal Medicine

## 2019-03-04 DIAGNOSIS — N401 Enlarged prostate with lower urinary tract symptoms: Secondary | ICD-10-CM | POA: Diagnosis not present

## 2019-03-04 DIAGNOSIS — R102 Pelvic and perineal pain: Secondary | ICD-10-CM | POA: Diagnosis not present

## 2019-03-04 DIAGNOSIS — R3912 Poor urinary stream: Secondary | ICD-10-CM | POA: Diagnosis not present

## 2019-03-08 NOTE — Telephone Encounter (Signed)
Patient called and said he would like to know when he can be scheduled for his procedure.

## 2019-03-11 NOTE — Telephone Encounter (Signed)
Pt advised he is on a list to call as soon as we are able to get his procedure scheduled.

## 2019-04-08 ENCOUNTER — Telehealth: Payer: Self-pay | Admitting: Gastroenterology

## 2019-04-08 NOTE — Telephone Encounter (Signed)
The pt aware we will call and get him scheduled

## 2019-04-18 ENCOUNTER — Telehealth: Payer: Self-pay

## 2019-04-18 ENCOUNTER — Other Ambulatory Visit: Payer: Self-pay | Admitting: Gastroenterology

## 2019-04-18 DIAGNOSIS — R059 Cough, unspecified: Secondary | ICD-10-CM

## 2019-04-18 DIAGNOSIS — R05 Cough: Secondary | ICD-10-CM

## 2019-04-18 DIAGNOSIS — K219 Gastro-esophageal reflux disease without esophagitis: Secondary | ICD-10-CM

## 2019-04-18 NOTE — Telephone Encounter (Signed)
Spoke to patient this morning. Procedure has been scheduled for EGD/ Bravo Ph study on 05/02/19 at [facility]. Patient knows to have Covid-19 lab test done on 04/29/19. Instructions mailed,patient will call with any questions.

## 2019-04-23 ENCOUNTER — Ambulatory Visit: Payer: 59 | Admitting: Internal Medicine

## 2019-04-29 ENCOUNTER — Other Ambulatory Visit (HOSPITAL_COMMUNITY)
Admission: RE | Admit: 2019-04-29 | Discharge: 2019-04-29 | Disposition: A | Discharge status: Home / Self Care | Payer: 59 | Source: Ambulatory Visit | Attending: Gastroenterology | Admitting: Gastroenterology

## 2019-04-29 DIAGNOSIS — Z1159 Encounter for screening for other viral diseases: Secondary | ICD-10-CM | POA: Insufficient documentation

## 2019-04-29 LAB — SARS CORONAVIRUS 2 (TAT 6-24 HRS): SARS Coronavirus 2: NEGATIVE

## 2019-04-30 ENCOUNTER — Encounter (HOSPITAL_COMMUNITY): Payer: Self-pay

## 2019-05-01 ENCOUNTER — Other Ambulatory Visit: Payer: Self-pay

## 2019-05-01 ENCOUNTER — Encounter (HOSPITAL_COMMUNITY): Payer: Self-pay

## 2019-05-01 NOTE — Progress Notes (Signed)
SPOKE W/  Jay Brooks     SCREENING SYMPTOMS OF COVID 19:   COUGH--NO  RUNNY NOSE--- NO  SORE THROAT---NO  NASAL CONGESTION----NO  SNEEZING----NO  SHORTNESS OF BREATH---NO  DIFFICULTY BREATHING---NO  TEMP >100.0 -----NO  UNEXPLAINED BODY ACHES------NO  CHILLS -------- NO  HEADACHES ---------NO  LOSS OF SMELL/ TASTE --------NO    HAVE YOU OR ANY FAMILY MEMBER TRAVELLED PAST 14 DAYS OUT OF THE   COUNTY---[place]IVES [place]----NO COUNTRY----NO  HAVE YOU OR ANY FAMILY MEMBER BEEN EXPOSED TO ANYONE WITH COVID 19? NO

## 2019-05-02 ENCOUNTER — Encounter (HOSPITAL_COMMUNITY)
Admission: RE | Disposition: A | Discharge status: Home / Self Care | Payer: Self-pay | Source: Home / Self Care | Attending: Gastroenterology

## 2019-05-02 ENCOUNTER — Ambulatory Visit (HOSPITAL_COMMUNITY): Payer: 59 | Admitting: Certified Registered"

## 2019-05-02 ENCOUNTER — Encounter (HOSPITAL_COMMUNITY): Payer: Self-pay | Admitting: Gastroenterology

## 2019-05-02 ENCOUNTER — Ambulatory Visit (HOSPITAL_COMMUNITY)
Admission: RE | Admit: 2019-05-02 | Discharge: 2019-05-02 | Disposition: A | Discharge status: Home / Self Care | Payer: 59 | Attending: Gastroenterology | Admitting: Gastroenterology

## 2019-05-02 ENCOUNTER — Ambulatory Visit: Payer: 59 | Admitting: Internal Medicine

## 2019-05-02 DIAGNOSIS — Z800 Family history of malignant neoplasm of digestive organs: Secondary | ICD-10-CM | POA: Diagnosis not present

## 2019-05-02 DIAGNOSIS — E559 Vitamin D deficiency, unspecified: Secondary | ICD-10-CM | POA: Diagnosis not present

## 2019-05-02 DIAGNOSIS — R918 Other nonspecific abnormal finding of lung field: Secondary | ICD-10-CM | POA: Diagnosis not present

## 2019-05-02 DIAGNOSIS — K219 Gastro-esophageal reflux disease without esophagitis: Secondary | ICD-10-CM | POA: Diagnosis not present

## 2019-05-02 DIAGNOSIS — G4700 Insomnia, unspecified: Secondary | ICD-10-CM | POA: Insufficient documentation

## 2019-05-02 DIAGNOSIS — R059 Cough, unspecified: Secondary | ICD-10-CM

## 2019-05-02 DIAGNOSIS — Z881 Allergy status to other antibiotic agents status: Secondary | ICD-10-CM | POA: Insufficient documentation

## 2019-05-02 DIAGNOSIS — K5790 Diverticulosis of intestine, part unspecified, without perforation or abscess without bleeding: Secondary | ICD-10-CM | POA: Insufficient documentation

## 2019-05-02 DIAGNOSIS — Z8371 Family history of colonic polyps: Secondary | ICD-10-CM | POA: Diagnosis not present

## 2019-05-02 DIAGNOSIS — I10 Essential (primary) hypertension: Secondary | ICD-10-CM | POA: Diagnosis not present

## 2019-05-02 DIAGNOSIS — Z803 Family history of malignant neoplasm of breast: Secondary | ICD-10-CM | POA: Insufficient documentation

## 2019-05-02 DIAGNOSIS — K2970 Gastritis, unspecified, without bleeding: Secondary | ICD-10-CM | POA: Insufficient documentation

## 2019-05-02 DIAGNOSIS — R05 Cough: Secondary | ICD-10-CM | POA: Diagnosis not present

## 2019-05-02 DIAGNOSIS — Z888 Allergy status to other drugs, medicaments and biological substances status: Secondary | ICD-10-CM | POA: Diagnosis not present

## 2019-05-02 HISTORY — DX: Essential (primary) hypertension: I10

## 2019-05-02 HISTORY — DX: Vitamin D deficiency, unspecified: E55.9

## 2019-05-02 HISTORY — DX: Early satiety: R68.81

## 2019-05-02 HISTORY — DX: Presence of spectacles and contact lenses: Z97.3

## 2019-05-02 HISTORY — DX: Family history of other specified conditions: Z84.89

## 2019-05-02 HISTORY — DX: Personal history of other diseases of the digestive system: Z87.19

## 2019-05-02 HISTORY — PX: BRAVO PH STUDY: SHX5421

## 2019-05-02 HISTORY — DX: Endocrine disorder, unspecified: E34.9

## 2019-05-02 HISTORY — PX: ESOPHAGOGASTRODUODENOSCOPY (EGD) WITH PROPOFOL: SHX5813

## 2019-05-02 HISTORY — DX: Insomnia, unspecified: G47.00

## 2019-05-02 HISTORY — DX: Chronic cough: R05.3

## 2019-05-02 SURGERY — ESOPHAGOGASTRODUODENOSCOPY (EGD) WITH PROPOFOL
Anesthesia: Monitor Anesthesia Care

## 2019-05-02 MED ORDER — PROPOFOL 10 MG/ML IV BOLUS
INTRAVENOUS | Status: AC
  Filled 2019-05-02: qty 40

## 2019-05-02 MED ORDER — LACTATED RINGERS IV SOLN
INTRAVENOUS | Status: DC
  Administered 2019-05-02: 08:00:00 via INTRAVENOUS

## 2019-05-02 MED ORDER — PROPOFOL 10 MG/ML IV BOLUS
INTRAVENOUS | Status: DC | PRN
  Administered 2019-05-02 (×4): 40 mg via INTRAVENOUS

## 2019-05-02 MED ORDER — SODIUM CHLORIDE 0.9 % IV SOLN: INTRAVENOUS | Status: DC

## 2019-05-02 MED ORDER — LIDOCAINE 2% (20 MG/ML) 5 ML SYRINGE
INTRAMUSCULAR | Status: DC | PRN
  Administered 2019-05-02: 40 mg via INTRAVENOUS
  Administered 2019-05-02: 60 mg via INTRAVENOUS

## 2019-05-02 SURGICAL SUPPLY — 15 items

## 2019-05-02 NOTE — Anesthesia Procedure Notes (Signed)
Procedure Name: MAC Date/Time: 05/02/2019 8:37 AM Performed by: Cynda Familia, CRNA Pre-anesthesia Checklist: Patient identified, Emergency Drugs available, Suction available, Patient being monitored and Timeout performed Patient Re-evaluated:Patient Re-evaluated prior to induction Oxygen Delivery Method: Nasal cannula Placement Confirmation: positive ETCO2 and breath sounds checked- equal and bilateral Dental Injury: Teeth and Oropharynx as per pre-operative assessment  Comments: Bite block by GI tech

## 2019-05-02 NOTE — Anesthesia Postprocedure Evaluation (Signed)
Anesthesia Post Note  Patient: FELTON BUCZYNSKI  Procedure(s) Performed: ESOPHAGOGASTRODUODENOSCOPY (EGD) WITH PROPOFOL (N/A ) BRAVO PH STUDY (N/A )     Patient location during evaluation: PACU Anesthesia Type: MAC Level of consciousness: awake and alert Pain management: pain level controlled Vital Signs Assessment: post-procedure vital signs reviewed and stable Respiratory status: spontaneous breathing Cardiovascular status: stable Anesthetic complications: no    Last Vitals:  Vitals:   05/02/19 0925 05/02/19 0929  BP:    Pulse: (!) 43 (!) 47  Resp: 10 14  Temp:    SpO2: 100% 100%    Last Pain:  Vitals:   05/02/19 0929  TempSrc:   PainSc: 0-No pain                 Nolon Nations

## 2019-05-02 NOTE — H&P (Signed)
HPI: This is a man with cough  Chief complaint is crhonic cough, ? Due to GERD  ROS: complete GI ROS as described in HPI, all other review negative.  Constitutional:  No unintentional weight loss   Past Medical History:  Diagnosis Date  . Allergy   . Chronic cough   . Chronic sinusitis   . Diverticulitis 2018   Mild  . Diverticulosis of colon (without mention of hemorrhage)   . Early satiety   . Esophageal motility disorder 12/2018  . GERD (gastroesophageal reflux disease)   . History of left inguinal hernia   . Hypertension   . Insomnia   . Lyme disease    QUESTIONABLE  . Pulmonary nodules 10/2018   Scattered tiny subpleural pulmonary nodules at the peripheral right lung base, largest 3 mm  . Right inguinal hernia 2017   Small fat containing right inguinal hernia  . Sleep deficient   . Testosterone deficiency   . Vitamin D deficiency   . Wears glasses     Past Surgical History:  Procedure Laterality Date  . COLONOSCOPY    . HERNIA REPAIR    . TOE SURGERY     INGROWN TOE NAIL, CHILDHOOD  . UPPER GASTROINTESTINAL ENDOSCOPY    . VASECTOMY      Current Facility-Administered Medications  Medication Dose Route Frequency Provider Last Rate Last Dose  . 0.9 %  sodium chloride infusion   Intravenous Continuous Milus Banister, MD      . lactated ringers infusion   Intravenous Continuous Milus Banister, MD        Allergies as of 04/18/2019 - Review Complete 01/30/2019  Allergen Reaction Noted  . Allegra [fexofenadine] Other (See Comments) 10/22/2014  . Doxycycline Nausea Only 12/30/2014  . Levaquin [levofloxacin] Nausea And Vomiting 09/08/2014  . Other  12/06/2012    Family History  Problem Relation Age of Onset  . Breast cancer Maternal Grandmother   . Colon polyps Brother   . Colon polyps Maternal Uncle   . Cancer Mother 54       pancreatic  . Colon cancer Neg Hx   . Esophageal cancer Neg Hx   . Stomach cancer Neg Hx   . Ulcerative colitis Neg Hx      Social History   Socioeconomic History  . Marital status: Married    Spouse name: Not on file  . Number of children: 1  . Years of education: Not on file  . Highest education level: Not on file  Occupational History  . Occupation: PILOT    Employer: [employer]  Social Needs  . Financial resource strain: Not on file  . Food insecurity    Worry: Not on file    Inability: Not on file  . Transportation needs    Medical: Not on file    Non-medical: Not on file  Tobacco Use  . Smoking status: Never Smoker  . Smokeless tobacco: Never Used  Substance and Sexual Activity  . Alcohol use: Not Currently    Comment: occ  . Drug use: No  . Sexual activity: Not on file  Lifestyle  . Physical activity    Days per week: Not on file    Minutes per session: Not on file  . Stress: Not on file  Relationships  . Social Herbalist on phone: Not on file    Gets together: Not on file    Attends religious service: Not on file    Active member  of club or organization: Not on file    Attends meetings of clubs or organizations: Not on file    Relationship status: Not on file  . Intimate partner violence    Fear of current or ex partner: Not on file    Emotionally abused: Not on file    Physically abused: Not on file    Forced sexual activity: Not on file  Other Topics Concern  . Not on file  Social History Narrative   Daily caffeine      Physical Exam: Ht 5\' 9"  (1.753 m)   Wt 77.1 kg   BMI 25.10 kg/m  Constitutional: generally well-appearing Psychiatric: alert and oriented x3 Abdomen: soft, nontender, nondistended, no obvious ascites, no peritoneal signs, normal bowel sounds No peripheral edema noted in lower extremities  Assessment and plan: 58 y.o. male with chronic cough  ? Due to GERD.  For pH testing today.  Please see the "Patient Instructions" section for addition details about the plan.  Owens Loffler, MD [facility] 05/02/2019, 7:32  AM

## 2019-05-02 NOTE — Progress Notes (Signed)
Bravo monitor and study teaching done with patient per protocol. Patient voiced understanding. Instruction papers given to patient with phone number for questions. Patient will bring monitor back after Saturday at 0900 to be downloaded.

## 2019-05-02 NOTE — Op Note (Signed)
[facility] Patient Name: Jay Brooks Procedure Date: 05/02/2019 MRN: [id number] Attending MD: Milus Banister , MD Date of Birth: May 07, 1961 CSN: [id number] Age: 58 Admit Type: Inpatient Procedure:                Upper GI endoscopy Indications:              Chronic cough, ? relation to GERD. Also gagging,                            intermittent vomiting. Providers:                Milus Banister, MD, Cleda Daub, RN, Marguerita Merles, Technician Referring MD:              Medicines:                Monitored Anesthesia Care Complications:            No immediate complications. Estimated blood loss:                            None. Estimated Blood Loss:     Estimated blood loss: none. Procedure:                Pre-Anesthesia Assessment:                           - Prior to the procedure, a History and Physical                            was performed, and patient medications and                            allergies were reviewed. The patient's tolerance of                            previous anesthesia was also reviewed. The risks                            and benefits of the procedure and the sedation                            options and risks were discussed with the patient.                            All questions were answered, and informed consent                            was obtained. Prior Anticoagulants: The patient has                            taken no previous anticoagulant or antiplatelet                            agents. ASA Grade  Assessment: II - A patient with                            mild systemic disease. After reviewing the risks                            and benefits, the patient was deemed in                            satisfactory condition to undergo the procedure.                           After obtaining informed consent, the endoscope was                            passed under direct vision. Throughout  the                            procedure, the patient's blood pressure, pulse, and                            oxygen saturations were monitored continuously. The                            GIF-H190 ([id number] Olympus gastroscope was                            introduced through the mouth, and advanced to the                            second part of duodenum. The upper GI endoscopy was                            accomplished without difficulty. The patient                            tolerated the procedure well. Scope In: Scope Out: Findings:      The UGI was normal.      GE junction was normal. Z line was located at 42cm from the incisors.      Bravo pH probe was placed at 36cm (6cm above the Z line per protocol).       Probe position was confirmed endoscopically following placement.      The exam was otherwise without abnormality. Impression:               - Normal UGI tract.                           - Bravo pH probe was placed in good position 6cm                            above the GE junction (per protocol). Moderate Sedation:      Not Applicable - Patient had care per Anesthesia. Recommendation:           - Patient has a  contact number available for                            emergencies. The signs and symptoms of potential                            delayed complications were discussed with the                            patient. Return to normal activities tomorrow.                            Written discharge instructions were provided to the                            patient.                           - Resume previous diet.                           - Continue present medications. You should continue                            to take your antiacid medicines throughout this                            test. Procedure Code(s):        --- Professional ---                           [phone], Esophagogastroduodenoscopy, flexible,                            transoral; with  biopsy, single or multiple Diagnosis Code(s):        --- Professional ---                           K29.70, Gastritis, unspecified, without bleeding                           R12, Heartburn CPT copyright 2019 American Medical Association. All rights reserved. The codes documented in this report are preliminary and upon coder review may  be revised to meet current compliance requirements. Milus Banister, MD 05/02/2019 9:06:47 AM This report has been signed electronically. Number of Addenda: 0

## 2019-05-02 NOTE — Transfer of Care (Signed)
Immediate Anesthesia Transfer of Care Note  Patient: Jay Brooks  Procedure(s) Performed: ESOPHAGOGASTRODUODENOSCOPY (EGD) WITH PROPOFOL (N/A ) BRAVO PH STUDY (N/A )  Patient Location: PACU and Endoscopy Unit  Anesthesia Type:MAC  Level of Consciousness: sedated  Airway & Oxygen Therapy: Patient Spontanous Breathing and Patient connected to nasal cannula oxygen  Post-op Assessment: Report given to RN and Post -op Vital signs reviewed and stable  Post vital signs: Reviewed and stable  Last Vitals:  Vitals Value Taken Time  BP    Temp    Pulse 44 05/02/19 0906  Resp 14 05/02/19 0906  SpO2 100 % 05/02/19 0906  Vitals shown include unvalidated device data.  Last Pain:  Vitals:   05/02/19 0735  TempSrc: Temporal  PainSc: 0-No pain         Complications: No apparent anesthesia complications

## 2019-05-02 NOTE — Discharge Instructions (Signed)
YOU HAD AN ENDOSCOPIC PROCEDURE TODAY: Refer to the procedure report and other information in the discharge instructions given to you for any specific questions about what was found during the examination. If this information does not answer your questions, please call [place] office at [phone] to clarify.  ° °YOU SHOULD EXPECT: Some feelings of bloating in the abdomen. Passage of more gas than usual. Walking can help get rid of the air that was put into your GI tract during the procedure and reduce the bloating. If you had a lower endoscopy (such as a colonoscopy or flexible sigmoidoscopy) you may notice spotting of blood in your stool or on the toilet paper. Some abdominal soreness may be present for a day or two, also. ° °DIET: Your first meal following the procedure should be a light meal and then it is ok to progress to your normal diet. A half-sandwich or bowl of soup is an example of a good first meal. Heavy or fried foods are harder to digest and may make you feel nauseous or bloated. Drink plenty of fluids but you should avoid alcoholic beverages for 24 hours. If you had a esophageal dilation, please see attached instructions for diet.   ° °ACTIVITY: Your care partner should take you home directly after the procedure. You should plan to take it easy, moving slowly for the rest of the day. You can resume normal activity the day after the procedure however YOU SHOULD NOT DRIVE, use power tools, machinery or perform tasks that involve climbing or major physical exertion for 24 hours (because of the sedation medicines used during the test).  ° °SYMPTOMS TO REPORT IMMEDIATELY: °A gastroenterologist can be reached at any hour. Please call [phone]  for any of the following symptoms:  °Following lower endoscopy (colonoscopy, flexible sigmoidoscopy) °Excessive amounts of blood in the stool  °Significant tenderness, worsening of abdominal pains  °Swelling of the abdomen that is new, acute  °Fever of 100° or  higher  °Following upper endoscopy (EGD, EUS, ERCP, esophageal dilation) °Vomiting of blood or coffee ground material  °New, significant abdominal pain  °New, significant chest pain or pain under the shoulder blades  °Painful or persistently difficult swallowing  °New shortness of breath  °Black, tarry-looking or red, bloody stools ° °FOLLOW UP:  °If any biopsies were taken you will be contacted by phone or by letter within the next 1-3 weeks. Call [phone]  if you have not heard about the biopsies in 3 weeks.  °Please also call with any specific questions about appointments or follow up tests. ° °

## 2019-05-02 NOTE — Anesthesia Preprocedure Evaluation (Signed)
Anesthesia Evaluation  Patient identified by MRN, date of birth, ID band Patient awake    Reviewed: Allergy & Precautions, NPO status , Patient's Chart, lab work & pertinent test results  Airway Mallampati: II  TM Distance: >3 FB Neck ROM: Full    Dental  (+) Teeth Intact, Dental Advisory Given, Implants, Caps   Pulmonary neg pulmonary ROS,    Pulmonary exam normal breath sounds clear to auscultation       Cardiovascular hypertension, Normal cardiovascular exam Rhythm:Regular Rate:Normal     Neuro/Psych negative neurological ROS  negative psych ROS   GI/Hepatic Neg liver ROS, GERD  ,  Endo/Other  negative endocrine ROS  Renal/GU negative Renal ROS     Musculoskeletal negative musculoskeletal ROS (+)   Abdominal   Peds  Hematology negative hematology ROS (+)   Anesthesia Other Findings   Reproductive/Obstetrics                             Anesthesia Physical Anesthesia Plan  ASA: II  Anesthesia Plan: MAC   Post-op Pain Management:    Induction: Intravenous  PONV Risk Score and Plan:   Airway Management Planned: Natural Airway  Additional Equipment:   Intra-op Plan:   Post-operative Plan:   Informed Consent: I have reviewed the patients History and Physical, chart, labs and discussed the procedure including the risks, benefits and alternatives for the proposed anesthesia with the patient or authorized representative who has indicated his/her understanding and acceptance.     Dental advisory given  Plan Discussed with: CRNA  Anesthesia Plan Comments:         Anesthesia Quick Evaluation

## 2019-05-03 ENCOUNTER — Telehealth: Payer: Self-pay | Admitting: Gastroenterology

## 2019-05-03 ENCOUNTER — Encounter (HOSPITAL_COMMUNITY): Payer: Self-pay | Admitting: Gastroenterology

## 2019-05-03 NOTE — Telephone Encounter (Signed)
The pt had EGD BRAVO yesterday and began to have "heartburn feeling" with painful swallowing with food.  He wants to know if this is normal with the Bravo?  Please advise.

## 2019-05-03 NOTE — Telephone Encounter (Signed)
Now, it is not normal but it is also unlikely a sign of anything serious going on.  For the next day or 2 he should probably eat soft foods only.

## 2019-05-03 NOTE — Telephone Encounter (Signed)
The pt has been advised and will call on Monday if no better

## 2019-05-09 ENCOUNTER — Telehealth: Payer: Self-pay | Admitting: Gastroenterology

## 2019-05-09 NOTE — Telephone Encounter (Signed)
I spoke to patient to inform him of results. He voiced understanding. He knows to decrease Aciphex to once daily in the AM and continue Pepcid at bedtime. Appointment has been made for 8/14 to discuss next plan of care.

## 2019-05-09 NOTE — Telephone Encounter (Signed)
Please call him. The Bravo 48 hour pH test showed that he does NOT have pathologic amounts of acid regurgitation ([place] 0.4, normal <14.7).  Also there was VERY poor correlation between his reported symptoms and any rare regurg events that did occur.  I recommend he cut back to once daily PPI (take it in the AM only). HE should continue H2 blocker at bedtime.  ROV with me in 6 weeks to discuss further.  Thanks

## 2019-05-15 ENCOUNTER — Ambulatory Visit: Payer: 59 | Admitting: Internal Medicine

## 2019-05-16 ENCOUNTER — Other Ambulatory Visit: Payer: Self-pay | Admitting: Gastroenterology

## 2019-05-20 ENCOUNTER — Other Ambulatory Visit: Payer: Self-pay

## 2019-05-20 ENCOUNTER — Ambulatory Visit (INDEPENDENT_AMBULATORY_CARE_PROVIDER_SITE_OTHER): Payer: 59

## 2019-05-20 VITALS — HR 100 | Temp 97.6°F | Ht 69.0 in | Wt 158.4 lb

## 2019-05-20 DIAGNOSIS — E291 Testicular hypofunction: Secondary | ICD-10-CM

## 2019-05-20 MED ORDER — TESTOSTERONE CYPIONATE 200 MG/ML IM SOLN
200.0000 mg | INTRAMUSCULAR | Status: DC
  Administered 2019-05-20: 200 mg via INTRAMUSCULAR

## 2019-05-20 NOTE — Progress Notes (Signed)
Patient here for a NV for his Testosterone Cypionate 200 mg/ml 2 ml IM LEFTupper outer quadrant injection. Patient tolerated well and will return in 3 weeks for his next injection

## 2019-05-22 ENCOUNTER — Ambulatory Visit (INDEPENDENT_AMBULATORY_CARE_PROVIDER_SITE_OTHER): Payer: 59 | Admitting: Internal Medicine

## 2019-05-22 ENCOUNTER — Other Ambulatory Visit: Payer: Self-pay

## 2019-05-22 VITALS — BP 114/70 | HR 60 | Temp 97.0°F | Resp 16 | Ht 69.0 in | Wt 157.8 lb

## 2019-05-22 DIAGNOSIS — R42 Dizziness and giddiness: Secondary | ICD-10-CM | POA: Diagnosis not present

## 2019-05-22 DIAGNOSIS — R0989 Other specified symptoms and signs involving the circulatory and respiratory systems: Secondary | ICD-10-CM | POA: Diagnosis not present

## 2019-05-22 MED ORDER — LISINOPRIL 20 MG PO TABS: ORAL_TABLET | ORAL | 1 refills | Status: DC

## 2019-05-22 NOTE — Progress Notes (Signed)
History of Present Illness:      This very nice 58 y.o. MWM with HTN, HLD, Pre-Diabetes and Vitamin D Deficiency who presents with c/o mild postural lightheadedness. He has apparently been out of work since last October and seen multiple providers in GI, Allergy & Pulmonary and no findings ever supported the cough that he felt disabled him as a co-pilot. He now reports that due to Covid slow-down, that his Company is offering some type of early retirement, so he is requesting a note to release him to return to work so he may qualify for early retirement.       Patient is followed expectantly for labile HTN since 2005  & BP has been controlled at home. Todays BP is at goal - 114/70.  He is back taking Atenolol & does report occasional mild postural lightheadedness with rapid standing. Patient has had no complaints of any cardiac type chest pain, palpitations, dyspnea / orthopnea / PND, claudication, or dependent edema.      Hyperlipidemia is controlled with diet  Patient denies myalgias or other med SEs. Last Lipids were at goal albeit elevated Trig's: Lab Results  Component Value Date   CHOL 160 01/30/2019   HDL 33 (L) 01/30/2019   LDLCALC 90 01/30/2019   TRIG 304 (H) 01/30/2019   CHOLHDL 4.8 01/30/2019       Patient  is on  Testosterone replacement by injection for hx/o Testosterone    Deficiency ("150" in 2010)  with improved stamina & sense of well being.      Also, the patient has history of PreDiabetes / Insulin Resistance (A1c 5.0% / elevated Insulin "55" / 2014)  and has had no symptoms of reactive hypoglycemia, diabetic polys, paresthesias or visual blurring.  Last A1c was at goal: Lab Results  Component Value Date   HGBA1C 5.1 07/30/2018       Further, the patient also has history of Vitamin D Deficiency ("32" / 2009)  and supplements vitamin D without any suspected side-effects. Last vitamin D was not at goal; Lab Results  Component Value Date   VD25OH 49 07/30/2018     Current Outpatient Medications on File Prior to Visit  Medication Sig   acetaminophen (TYLENOL) 500 MG tablet Take 500 mg by mouth every 6 (six) hours as needed for moderate pain or headache.   atenolol (TENORMIN) 25 MG tablet Take 25 mg by mouth daily.   calcium carbonate (TUMS - DOSED IN MG ELEMENTAL CALCIUM) 500 MG chewable tablet Chew 1 tablet by mouth 4 (four) times daily as needed for indigestion or heartburn.   carboxymethylcellulose (REFRESH TEARS) 0.5 % SOLN Place 2 drops into both eyes 2 (two) times a day.   Cholecalciferol (VITAMIN D) 50 MCG (2000 UT) CAPS Take 6,000 Units by mouth daily.   clindamycin (CLEOCIN T) 1 % lotion Apply 1 application topically 2 (two) times daily as needed (applied to scalp as needed for folliculitis).   famotidine (PEPCID) 20 MG tablet 2 after supper (Patient taking differently: Take 40 mg by mouth at bedtime. )   fluticasone (FLONASE) 50 MCG/ACT nasal spray Place 2 sprays into both nostrils daily.    OVER THE COUNTER MEDICATION Take 2 capsules by mouth at bedtime. serenagen otc supplement   RABEprazole (ACIPHEX) 20 MG tablet TAKE ONE TABLET BY MOUTH TWICE DAILY    tamsulosin (FLOMAX) 0.4 MG CAPS capsule TAKE 1 CAPSULE (0.4 MG TOTAL) BY MOUTH DAILY AFTER SUPPER. (Patient taking differently: Take 0.4 mg by mouth daily. )  testosterone cypionate (DEPOTESTOSTERONE CYPIONATE) 200 MG/ML injection INJECT 2ML INTO THE MUSCLE EVERY 2 WEEKS (Patient taking differently: Inject 400 mg into the muscle every 21 ( twenty-one) days. )   triamcinolone cream (KENALOG) 0.1 % Apply 1 application topically 2 (two) times daily as needed for itching.   No current facility-administered medications on file prior to visit.    Allergies  Allergen Reactions   Allegra [Fexofenadine] Other (See Comments)    Worsened insomnia   Benzoyl Peroxide     blisters   Doxycycline Nausea Only   Levaquin [Levofloxacin] Nausea And Vomiting    Patient states that after  he took levaquin this past time he had nausea with vomiting   PMHx:   Past Medical History:  Diagnosis Date   Allergy    Chronic cough    Chronic sinusitis    Diverticulitis 2018   Mild   Diverticulosis of colon (without mention of hemorrhage)    Early satiety    Esophageal motility disorder 12/2018   Family history of adverse reaction to anesthesia    mom has PONV   GERD (gastroesophageal reflux disease)    History of left inguinal hernia    Hypertension    Insomnia    Lyme disease    QUESTIONABLE   Pulmonary nodules 10/2018   Scattered tiny subpleural pulmonary nodules at the peripheral right lung base, largest 3 mm   Right inguinal hernia 2017   Small fat containing right inguinal hernia   Sleep deficient    Testosterone deficiency    Vitamin D deficiency    Wears glasses    Immunization History  Administered Date(s) Administered   Influenza Inj Mdck Quad With Preservative 08/29/2017, 07/30/2018   Influenza Split 08/07/2012, 09/25/2014   Influenza, Seasonal, Injecte, Preservative Fre 09/25/2015   Influenza,inj,quad, With Preservative 09/15/2016   PPD Test 12/01/2014, 12/08/2015, 07/11/2017, 07/30/2018   Tdap 07/08/2010   Past Surgical History:  Procedure Laterality Date   BRAVO [place] STUDY N/A 05/02/2019   Procedure: BRAVO [place] STUDY;  Surgeon: Milus Banister, MD;  Location: [facility];  Service: Endoscopy;  Laterality: N/A;   COLONOSCOPY     ESOPHAGOGASTRODUODENOSCOPY (EGD) WITH PROPOFOL N/A 05/02/2019   Procedure: ESOPHAGOGASTRODUODENOSCOPY (EGD) WITH PROPOFOL;  Surgeon: Milus Banister, MD;  Location: [facility];  Service: Endoscopy;  Laterality: N/A;   HERNIA REPAIR     TOE SURGERY     INGROWN TOE NAIL, CHILDHOOD   UPPER GASTROINTESTINAL ENDOSCOPY     VASECTOMY      FHx:    Reviewed / unchanged  SHx:    Reviewed / unchanged   Systems Review:  Constitutional: Denies fever, chills, wt changes, headaches, insomnia,  fatigue, night sweats, change in appetite. Eyes: Denies redness, blurred vision, diplopia, discharge, itchy, watery eyes.  ENT: Denies discharge, congestion, post nasal drip, epistaxis, sore throat, earache, hearing loss, dental pain, tinnitus, vertigo, sinus pain, snoring.  CV: Denies chest pain, palpitations, irregular heartbeat, syncope, dyspnea, diaphoresis, orthopnea, PND, claudication or edema. Respiratory: denies cough, dyspnea, DOE, pleurisy, hoarseness, laryngitis, wheezing.  Gastrointestinal: Denies dysphagia, odynophagia, heartburn, reflux, water brash, abdominal pain or cramps, nausea, vomiting, bloating, diarrhea, constipation, hematemesis, melena, hematochezia  or hemorrhoids. Genitourinary: Denies dysuria, frequency, urgency, nocturia, hesitancy, discharge, hematuria or flank pain. Musculoskeletal: Denies arthralgias, myalgias, stiffness, jt. swelling, pain, limping or strain/sprain.  Skin: Denies pruritus, rash, hives, warts, acne, eczema or change in skin lesion(s). Neuro: No weakness, tremor, incoordination, spasms, paresthesia or pain. Psychiatric: Denies confusion, memory loss or sensory loss.  Endo: Denies change in weight, skin or hair change.  Heme/Lymph: No excessive bleeding, bruising or enlarged lymph nodes.  Physical Exam  BP 114/70    Pulse 60    Temp (!) 97 F (36.1 C)    Resp 16    Ht 5\' 9"  (1.753 m)    Wt 157 lb 12.8 oz (71.6 kg)    BMI 23.30 kg/m   Postural Sitting  BP 139/86   P 54          &             Standing BP 125/85    P  53  Appears  well nourished, well groomed  and in no distress.  Eyes: PERRLA, EOMs, conjunctiva no swelling or erythema. Sinuses: No frontal/maxillary tenderness ENT/Mouth: EAC's clear, TM's nl w/o erythema, bulging. Nares clear w/o erythema, swelling, exudates. Oropharynx clear without erythema or exudates. Oral hygiene is good. Tongue normal, non obstructing. Hearing intact.  Neck: Supple. Thyroid not palpable. Car 2+/2+ without  bruits, nodes or JVD. Chest: Respirations nl with BS clear & equal w/o rales, rhonchi, wheezing or stridor.  Cor: Heart sounds normal w/ regular rate and rhythm without sig. murmurs, gallops, clicks or rubs. Peripheral pulses normal and equal  without edema.  Abdomen: Soft & bowel sounds normal. Non-tender w/o guarding, rebound, hernias, masses or organomegaly.  Lymphatics: Unremarkable.  Musculoskeletal: Full ROM all peripheral extremities, joint stability, 5/5 strength and normal gait.  Skin: Warm, dry without exposed rashes, lesions or ecchymosis apparent.  Neuro: Cranial nerves intact, reflexes equal bilaterally. Sensory-motor testing grossly intact. Tendon reflexes grossly intact.  Pysch: Alert & oriented x 3.  Insight and judgement nl & appropriate. No ideations.  Assessment and Plan:  1. Labile hypertension  - advised taper his Atenolol 50 mg to 1/2 tab qam & add Lisinopril 20 mg x 1/2 tab qhs  - lisinopril (ZESTRIL) 20 MG tablet; Take 1 tablet every morning for BP  Dispense: 90 tablet; Refill: 1  2. Postural dizziness  - recc monitor postural BP's  - Reminder to go to the ER if any CP,  SOB, nausea, dizziness, severe HA, changes vision/speech.       Discussed  regular exercise, BP monitoring, weight control to achieve/maintain BMI less than 25 and discussed med and SE's. Recommended labs to assess and monitor clinical status with further disposition pending results of labs.  I discussed the assessment and treatment plan with the patient. The patient was provided an opportunity to ask questions and all were answered. The patient agreed with the plan and demonstrated an understanding of the instructions.  I provided over 20 minutes of exam, counseling, chart review and  complex critical decision making.       The patient was advised to call back or seek an in-person evaluation if the symptoms worsen or if the condition fails to improve as anticipated.   Kirtland Bouchard, MD

## 2019-05-26 ENCOUNTER — Encounter: Payer: Self-pay | Admitting: Internal Medicine

## 2019-06-02 NOTE — Progress Notes (Signed)
Subjective:    Patient ID: Jay Brooks, male    DOB: 1960/12/20, 58 y.o.   MRN: [id number]  HPI    This very nice 58 y.o. MWM with labile HTN, HLD, Pre-Diabetes and Vitamin D Deficiency returns for 2 week f/u of med adjustment for HTN by tapering his Atenolol 50 mg to 1/2 tab qam and adding Lisinopril 20 mg qhs. Is back up to a whole Atenolol 50 mg qam. Reports occas mild frontal to vertex to occiput HA. No major postural lightheadedness.   Medication Sig  . acetaminophen (TYLENOL) 500 MG tablet Take 500 mg by mouth every 6 (six) hours as needed for moderate pain or headache.  Marland Kitchen atenolol (TENORMIN) 50 MG tablet Take 50 mg  Every morning  . calcium carbonate (TUMS - DOSED IN MG ELEMENTAL CALCIUM) 500 MG chewable tablet Chew 1 tablet by mouth 4 (four) times daily as needed for indigestion or heartburn.  . carboxymethylcellulose (REFRESH TEARS) 0.5 % SOLN Place 2 drops into both eyes 2 (two) times a day.  . Cholecalciferol (VITAMIN D) 50 MCG (2000 UT) CAPS Take 6,000 Units by mouth daily.  . clindamycin (CLEOCIN T) 1 % lotion Apply 1 application topically 2 (two) times daily as needed (applied to scalp as needed for folliculitis).  . famotidine (PEPCID) 20 MG tablet 2 after supper (Patient taking differently: Take 40 mg by mouth at bedtime. )  . fluticasone (FLONASE) 50 MCG/ACT nasal spray Place 2 sprays into both nostrils daily.   Marland Kitchen lisinopril (ZESTRIL) 20 MG tablet Take 1 tablet every evening for BP  . OVER THE COUNTER MEDICATION Take 2 capsules by mouth at bedtime. serenagen otc supplement  . RABEprazole (ACIPHEX) 20 MG tablet TAKE ONE TABLET BY MOUTH TWICE DAILY   . tamsulosin (FLOMAX) 0.4 MG CAPS capsule TAKE 1 CAPSULE (0.4 MG TOTAL) BY MOUTH DAILY AFTER SUPPER. (Patient taking differently: Take 0.4 mg by mouth daily. )  . testosterone cypionate (DEPOTESTOSTERONE CYPIONATE) 200 MG/ML injection INJECT 2ML INTO THE MUSCLE EVERY 2 WEEKS (Patient taking differently: Inject 400 mg into the muscle  every 21 ( twenty-one) days. )  . triamcinolone cream (KENALOG) 0.1 % Apply 1 application topically 2 (two) times daily as needed for itching.    Allergies  Allergen Reactions  . Allegra [Fexofenadine] Other (See Comments)    Worsened insomnia  . Benzoyl Peroxide     blisters  . Doxycycline Nausea Only  . Levaquin [Levofloxacin] Nausea And Vomiting    Patient states that after he took levaquin this past time he had nausea with vomiting    Past Medical History:  Diagnosis Date  . Allergy   . Chronic cough   . Chronic sinusitis   . Diverticulitis 2018   Mild  . Diverticulosis of colon (without mention of hemorrhage)   . Early satiety   . Esophageal motility disorder 12/2018  . Family history of adverse reaction to anesthesia    mom has PONV  . GERD (gastroesophageal reflux disease)   . History of left inguinal hernia   . Hypertension   . Insomnia   . Lyme disease    QUESTIONABLE  . Pulmonary nodules 10/2018   Scattered tiny subpleural pulmonary nodules at the peripheral right lung base, largest 3 mm  . Right inguinal hernia 2017   Small fat containing right inguinal hernia  . Sleep deficient   . Testosterone deficiency   . Vitamin D deficiency   . Wears glasses    Review of Systems  10 point systems review negative except as above.    Objective:   Physical Exam  BP 104/60   Pulse 76   Temp 97.7 F (36.5 C)   Resp 16   Ht 5\' 9"  (1.753 m)   Wt 162 lb 9.6 oz (73.8 kg)   BMI 24.01 kg/m   Postural         Sitting BP 111/72     P 50         &          Standing  BP 113/77      P 55  HEENT - WNL. Neck - supple.  Chest - Clear equal BS. Cor - Nl HS. RRR w/o sig MGR. PP 1(+). No edema. MS- FROM w/o deformities.  Gait Nl. Neuro -  Nl w/o focal abnormalities.    Assessment & Plan:   1. Essential hypertension  - advised to continue meds same  - reassured that postural BP's are acceptable & physiologic.

## 2019-06-03 ENCOUNTER — Encounter: Payer: Self-pay | Admitting: Internal Medicine

## 2019-06-03 ENCOUNTER — Ambulatory Visit (INDEPENDENT_AMBULATORY_CARE_PROVIDER_SITE_OTHER): Payer: 59 | Admitting: Internal Medicine

## 2019-06-03 ENCOUNTER — Other Ambulatory Visit: Payer: Self-pay

## 2019-06-03 VITALS — BP 104/60 | HR 76 | Temp 97.7°F | Resp 16 | Ht 69.0 in | Wt 162.6 lb

## 2019-06-03 DIAGNOSIS — R0989 Other specified symptoms and signs involving the circulatory and respiratory systems: Secondary | ICD-10-CM

## 2019-06-03 DIAGNOSIS — I10 Essential (primary) hypertension: Secondary | ICD-10-CM | POA: Diagnosis not present

## 2019-06-03 MED ORDER — LISINOPRIL 20 MG PO TABS: ORAL_TABLET | ORAL | 1 refills | Status: DC

## 2019-06-03 MED ORDER — ATENOLOL 50 MG PO TABS: ORAL_TABLET | ORAL | 1 refills | Status: DC

## 2019-06-18 ENCOUNTER — Other Ambulatory Visit: Payer: Self-pay | Admitting: Internal Medicine

## 2019-06-18 ENCOUNTER — Ambulatory Visit: Payer: 59

## 2019-06-18 DIAGNOSIS — E349 Endocrine disorder, unspecified: Secondary | ICD-10-CM

## 2019-06-19 ENCOUNTER — Other Ambulatory Visit: Payer: Self-pay

## 2019-06-19 ENCOUNTER — Ambulatory Visit (INDEPENDENT_AMBULATORY_CARE_PROVIDER_SITE_OTHER): Payer: 59

## 2019-06-19 ENCOUNTER — Other Ambulatory Visit: Payer: Self-pay | Admitting: Internal Medicine

## 2019-06-19 VITALS — BP 120/80 | HR 63 | Temp 97.9°F | Wt 159.2 lb

## 2019-06-19 DIAGNOSIS — E291 Testicular hypofunction: Secondary | ICD-10-CM | POA: Diagnosis not present

## 2019-06-19 MED ORDER — TESTOSTERONE CYPIONATE 200 MG/ML IM SOLN
200.0000 mg | Freq: Once | INTRAMUSCULAR | Status: AC
  Administered 2019-06-19: 200 mg via INTRAMUSCULAR

## 2019-06-19 NOTE — Progress Notes (Signed)
Patient here for a NV for his Testosterone Cypionate 200 mg/ml 2 ml IMRIGHTupper outer quadrant injection. Patient tolerated well and will return in 3 weeks for his next injection

## 2019-06-20 ENCOUNTER — Ambulatory Visit: Payer: 59

## 2019-06-21 ENCOUNTER — Telehealth: Payer: Self-pay | Admitting: Internal Medicine

## 2019-06-21 ENCOUNTER — Ambulatory Visit: Payer: 59 | Admitting: Gastroenterology

## 2019-06-21 DIAGNOSIS — R05 Cough: Secondary | ICD-10-CM

## 2019-06-21 DIAGNOSIS — R059 Cough, unspecified: Secondary | ICD-10-CM

## 2019-06-21 NOTE — Telephone Encounter (Signed)
Called and spoke with pt who stated he was diagnosed with asthma by Dr. Orvil Feil and stated that he had an appt with him yesterday 8/13.   Per pt, PFT is being required by FAA physical. Dr. Melvyn Novas, please advise if you are okay with ordering a PFT to be completed or if you want pt to be scheduled a f/u OV and PFT same day with you?

## 2019-06-21 NOTE — Telephone Encounter (Signed)
Since I am not actively treating him at this point, we can do the pft's with an interpretation of the data without  further comment on his care beyond what the PFT shows.   The other option is to let Dr Orvil Feil do the spirometry.  No need for f/u ov from me as long as he is under the care of an allergist as we would  likely be working on the same problems

## 2019-06-21 NOTE — Telephone Encounter (Signed)
Call returned to patient, made aware of MW recommendations. Voiced understanding. Order placed for PFT. Will route message to [facility] to f/u on Covid/PFT scheduling.

## 2019-06-25 ENCOUNTER — Ambulatory Visit (INDEPENDENT_AMBULATORY_CARE_PROVIDER_SITE_OTHER): Payer: 59 | Admitting: Gastroenterology

## 2019-06-25 ENCOUNTER — Encounter: Payer: Self-pay | Admitting: Gastroenterology

## 2019-06-25 ENCOUNTER — Other Ambulatory Visit: Payer: Self-pay

## 2019-06-25 VITALS — BP 104/64 | HR 68 | Temp 97.7°F | Ht 68.5 in | Wt 163.1 lb

## 2019-06-25 DIAGNOSIS — R1013 Epigastric pain: Secondary | ICD-10-CM | POA: Diagnosis not present

## 2019-06-25 NOTE — Telephone Encounter (Signed)
Scheduled pft on 07/10/2019-pr

## 2019-06-25 NOTE — Patient Instructions (Signed)
Please purchase the following medications over the counter and take as directed: Gas-X take 1 pill with every meal  Cut back on your fiber  Call our office in 2 months the report how you are doing  Thank you for entrusting me with your care and choosing [facility].  Dr Ardis Hughs

## 2019-06-25 NOTE — Progress Notes (Signed)
Review of pertinent gastrointestinal problems: 1. Routine risk for crc,colonoscopy 12/2010 found diverticulosis only, no polyps. Recommended recall colonoscopy in 10 years. 2. GERD: 6/14 EGDDr. Ardis Hughs found mild non specific gastritis. Biopsies neg for H. Pylori. 2016 USdone for dyspepsia, was normal.  November 2019 ENT evaluation, laryngoscope he suggested some slight irritation, possibly related to reflux.  EGD 04/2019; normal, Bravo pH probe placed. The Bravo 48 hour pH test showed that he does NOT have pathologic amounts of acid regurgitation ([place] 0.4, normal <14.7).  Also there was VERY poor correlation between his reported symptoms and any rare regurg events that did occur.  I recommended he cut back to proton pump inhibitor once daily and H2 blocker at bedtime. 3.  Acute diverticulitis, clinically mild, proven by CT scan November 2018 "mild diverticulitis at the rectosigmoid junction" improved with 10-day oral antibiotic course. 4. Chronic cough; probably NOT related to GERD, see pH results above.   HPI: This is a pleasant 58 year old man whom I last saw the time of EGD were placed a Bravo pH probe.  Feels results summarized above.  His cough has overall been quite a bit better over the past for 5 months.  No bed coughing spells at least.  He still has mild intermittent cough.  Tells me he is moving his bowels a bit more frequently than he used to.  This still solid he has no straining and no bleeding but he is going after every meal where is previously he was only having a bowel movement every morning.  He tells me he takes 2 Colace and 2 fiber pills every night.  He has had a lot of bloating and belching since his upper endoscopy.  He is currently taking AcipHex every morning and Pepcid at  Bedtime  Chief complaint is belching, change in bowels ROS: complete GI ROS as described in HPI, all other review negative.  Constitutional:  No unintentional weight loss   Past Medical  History:  Diagnosis Date  . Allergy   . Chronic cough   . Chronic sinusitis   . Diverticulitis 2018   Mild  . Diverticulosis of colon (without mention of hemorrhage)   . Early satiety   . Esophageal motility disorder 12/2018  . Family history of adverse reaction to anesthesia    mom has PONV  . GERD (gastroesophageal reflux disease)   . History of left inguinal hernia   . Hypertension   . Insomnia   . Lyme disease    QUESTIONABLE  . Pulmonary nodules 10/2018   Scattered tiny subpleural pulmonary nodules at the peripheral right lung base, largest 3 mm  . Right inguinal hernia 2017   Small fat containing right inguinal hernia  . Sleep deficient   . Testosterone deficiency   . Vitamin D deficiency   . Wears glasses     Past Surgical History:  Procedure Laterality Date  . BRAVO [place] STUDY N/A 05/02/2019   Procedure: BRAVO [place] STUDY;  Surgeon: Milus Banister, MD;  Location: [facility];  Service: Endoscopy;  Laterality: N/A;  . COLONOSCOPY    . ESOPHAGOGASTRODUODENOSCOPY (EGD) WITH PROPOFOL N/A 05/02/2019   Procedure: ESOPHAGOGASTRODUODENOSCOPY (EGD) WITH PROPOFOL;  Surgeon: Milus Banister, MD;  Location: [facility];  Service: Endoscopy;  Laterality: N/A;  . HERNIA REPAIR    . TOE SURGERY     INGROWN TOE NAIL, CHILDHOOD  . UPPER GASTROINTESTINAL ENDOSCOPY    . VASECTOMY      Current Outpatient Medications  Medication Sig Dispense Refill  .  acetaminophen (TYLENOL) 500 MG tablet Take 500 mg by mouth every 6 (six) hours as needed for moderate pain or headache.    Marland Kitchen atenolol (TENORMIN) 50 MG tablet Take 1 tablet every Morning for BP 90 tablet 1  . Azelastine HCl 137 MCG/SPRAY SOLN Place 2 sprays into both nostrils daily.    . calcium carbonate (TUMS - DOSED IN MG ELEMENTAL CALCIUM) 500 MG chewable tablet Chew 1 tablet by mouth 4 (four) times daily as needed for indigestion or heartburn.    . carboxymethylcellulose (REFRESH TEARS) 0.5 % SOLN Place 2 drops into both eyes 2  (two) times a day.    . Cholecalciferol (VITAMIN D) 50 MCG (2000 UT) CAPS Take 6,000 Units by mouth daily.    . clindamycin (CLEOCIN T) 1 % lotion Apply 1 application topically 2 (two) times daily as needed (applied to scalp as needed for folliculitis).    . famotidine (PEPCID) 20 MG tablet 2 after supper (Patient taking differently: Take 40 mg by mouth at bedtime. ) 60 tablet 11  . fluticasone (FLONASE) 50 MCG/ACT nasal spray Place 2 sprays into both nostrils daily.     Marland Kitchen lisinopril (ZESTRIL) 20 MG tablet Take 1 tablet every Night  for BP 90 tablet 1  . OVER THE COUNTER MEDICATION Take 2 capsules by mouth at bedtime. serenagen otc supplement    . RABEprazole (ACIPHEX) 20 MG tablet TAKE ONE TABLET BY MOUTH TWICE DAILY  60 tablet 3  . tamsulosin (FLOMAX) 0.4 MG CAPS capsule TAKE 1 CAPSULE (0.4 MG TOTAL) BY MOUTH DAILY AFTER SUPPER. (Patient taking differently: Take 0.4 mg by mouth daily. ) 90 capsule 1  . testosterone cypionate (DEPOTESTOSTERONE CYPIONATE) 200 MG/ML injection INJECT 2ML INTO THE MUSCLE EVERY 2 WEEKS 10 mL 2  . triamcinolone cream (KENALOG) 0.1 % Apply 1 application topically 2 (two) times daily as needed for itching.     No current facility-administered medications for this visit.     Allergies as of 06/25/2019 - Review Complete 06/25/2019  Allergen Reaction Noted  . Allegra [fexofenadine] Other (See Comments) 10/22/2014  . Benzoyl peroxide  04/26/2019  . Doxycycline Nausea Only 12/30/2014  . Levaquin [levofloxacin] Nausea And Vomiting 09/08/2014    Family History  Problem Relation Age of Onset  . Breast cancer Maternal Grandmother   . Colon polyps Brother   . Colon polyps Maternal Uncle   . Cancer Mother 53       pancreatic  . Colon cancer Neg Hx   . Esophageal cancer Neg Hx   . Stomach cancer Neg Hx   . Ulcerative colitis Neg Hx     Social History   Socioeconomic History  . Marital status: Married    Spouse name: Not on file  . Number of children: 1  .  Years of education: Not on file  . Highest education level: Not on file  Occupational History  . Occupation: PILOT    Employer: [employer]  Social Needs  . Financial resource strain: Not on file  . Food insecurity    Worry: Not on file    Inability: Not on file  . Transportation needs    Medical: Not on file    Non-medical: Not on file  Tobacco Use  . Smoking status: Never Smoker  . Smokeless tobacco: Never Used  Substance and Sexual Activity  . Alcohol use: Not Currently    Comment: occ  . Drug use: No  . Sexual activity: Not on file  Lifestyle  .  Physical activity    Days per week: Not on file    Minutes per session: Not on file  . Stress: Not on file  Relationships  . Social Herbalist on phone: Not on file    Gets together: Not on file    Attends religious service: Not on file    Active member of club or organization: Not on file    Attends meetings of clubs or organizations: Not on file    Relationship status: Not on file  . Intimate partner violence    Fear of current or ex partner: Not on file    Emotionally abused: Not on file    Physically abused: Not on file    Forced sexual activity: Not on file  Other Topics Concern  . Not on file  Social History Narrative   Daily caffeine      Physical Exam: BP 104/64 (BP Location: Left Arm, Patient Position: Sitting, Cuff Size: Normal)   Pulse 68   Temp 97.7 F (36.5 C)   Ht 5' 8.5" (1.74 m) Comment: height measured without shoes  Wt 163 lb 2 oz (74 kg)   BMI 24.44 kg/m  Constitutional: generally well-appearing Psychiatric: alert and oriented x3 Abdomen: soft, nontender, nondistended, no obvious ascites, no peritoneal signs, normal bowel sounds No peripheral edema noted in lower extremities  Assessment and plan: 58 y.o. male with belching, change in bowels  He is having BMs after every meal whereas he used to just go once a day.  He takes 2 Colace and 2 fiber pills every day.  I asked him to  cut back to 1 Colace and 1 fiber pill daily and to see how his bowels respond.  If he is still going more frequently than usual then he might need a colonoscopy.  I recommended a trial of Gas-X with every meal for his bloating and gassiness, belching.  I see no reason for any further blood tests or imaging studies at this point.  Please see the "Patient Instructions" section for addition details about the plan.  Owens Loffler, MD [facility] 06/25/2019, 2:23 PM

## 2019-06-28 ENCOUNTER — Other Ambulatory Visit: Payer: Self-pay | Admitting: Internal Medicine

## 2019-07-05 ENCOUNTER — Other Ambulatory Visit
Admission: RE | Admit: 2019-07-05 | Discharge: 2019-07-05 | Disposition: A | Discharge status: Home / Self Care | Payer: 59 | Source: Ambulatory Visit | Attending: Internal Medicine | Admitting: Internal Medicine

## 2019-07-05 ENCOUNTER — Other Ambulatory Visit: Payer: Self-pay

## 2019-07-05 DIAGNOSIS — Z20828 Contact with and (suspected) exposure to other viral communicable diseases: Secondary | ICD-10-CM | POA: Diagnosis not present

## 2019-07-05 DIAGNOSIS — Z01812 Encounter for preprocedural laboratory examination: Secondary | ICD-10-CM | POA: Diagnosis present

## 2019-07-06 LAB — SARS CORONAVIRUS 2 (TAT 6-24 HRS): SARS Coronavirus 2: NEGATIVE

## 2019-07-08 NOTE — Progress Notes (Signed)
mychart note sent

## 2019-07-10 ENCOUNTER — Other Ambulatory Visit: Payer: Self-pay

## 2019-07-10 ENCOUNTER — Ambulatory Visit (INDEPENDENT_AMBULATORY_CARE_PROVIDER_SITE_OTHER): Payer: 59 | Admitting: Internal Medicine

## 2019-07-10 DIAGNOSIS — R05 Cough: Secondary | ICD-10-CM | POA: Diagnosis not present

## 2019-07-10 DIAGNOSIS — R059 Cough, unspecified: Secondary | ICD-10-CM

## 2019-07-10 LAB — PULMONARY FUNCTION TEST
DL/VA % pred: 126 %
DL/VA: 5.37 ml/min/mmHg/L
DLCO unc % pred: 110 %
DLCO unc: 29.64 ml/min/mmHg
FEF 25-75 Post: 4.46 L/sec
FEF 25-75 Pre: 3.72 L/sec
FEF2575-%Change-Post: 20 %
FEF2575-%Pred-Post: 154 %
FEF2575-%Pred-Pre: 128 %
FEV1-%Change-Post: 3 %
FEV1-%Pred-Post: 97 %
FEV1-%Pred-Pre: 94 %
FEV1-Post: 3.39 L
FEV1-Pre: 3.29 L
FEV1FVC-%Change-Post: 1 %
FEV1FVC-%Pred-Pre: 109 %
FEV6-%Change-Post: 2 %
FEV6-%Pred-Post: 91 %
FEV6-%Pred-Pre: 89 %
FEV6-Post: 4.03 L
FEV6-Pre: 3.93 L
FEV6FVC-%Change-Post: 0 %
FEV6FVC-%Pred-Post: 104 %
FEV6FVC-%Pred-Pre: 104 %
FVC-%Change-Post: 1 %
FVC-%Pred-Post: 87 %
FVC-%Pred-Pre: 86 %
FVC-Post: 4.04 L
FVC-Pre: 3.97 L
Post FEV1/FVC ratio: 84 %
Post FEV6/FVC ratio: 100 %
Pre FEV1/FVC ratio: 83 %
Pre FEV6/FVC Ratio: 99 %
RV % pred: 95 %
RV: 2.08 L
TLC % pred: 92 %
TLC: 6.27 L

## 2019-07-10 NOTE — Progress Notes (Signed)
Full PFT performed today. °

## 2019-07-12 NOTE — Progress Notes (Signed)
Spoke with pt and notified of results per Dr. Wert. Pt verbalized understanding and denied any questions. 

## 2019-07-18 ENCOUNTER — Other Ambulatory Visit: Payer: Self-pay | Admitting: Otolaryngology

## 2019-07-18 DIAGNOSIS — J329 Chronic sinusitis, unspecified: Secondary | ICD-10-CM

## 2019-07-22 ENCOUNTER — Telehealth: Payer: Self-pay | Admitting: Internal Medicine

## 2019-07-22 NOTE — Telephone Encounter (Signed)
Pt has questions regarding pft from last week.  Pt is overdue for ROV with MW anyways- scheduled OV on 9/16 to discuss PFT in detail.  Nothing further needed at this time- will close encounter.

## 2019-07-24 ENCOUNTER — Other Ambulatory Visit: Payer: Self-pay

## 2019-07-24 ENCOUNTER — Ambulatory Visit (INDEPENDENT_AMBULATORY_CARE_PROVIDER_SITE_OTHER): Payer: 59 | Admitting: *Deleted

## 2019-07-24 ENCOUNTER — Ambulatory Visit
Admission: RE | Admit: 2019-07-24 | Discharge: 2019-07-24 | Disposition: A | Discharge status: Home / Self Care | Payer: 59 | Source: Ambulatory Visit | Attending: Otolaryngology | Admitting: Otolaryngology

## 2019-07-24 ENCOUNTER — Ambulatory Visit (INDEPENDENT_AMBULATORY_CARE_PROVIDER_SITE_OTHER): Payer: 59 | Admitting: Internal Medicine

## 2019-07-24 ENCOUNTER — Encounter: Payer: Self-pay | Admitting: Internal Medicine

## 2019-07-24 VITALS — BP 112/64 | HR 52 | Temp 97.2°F | Resp 16 | Ht 68.5 in | Wt 162.0 lb

## 2019-07-24 DIAGNOSIS — R0989 Other specified symptoms and signs involving the circulatory and respiratory systems: Secondary | ICD-10-CM

## 2019-07-24 DIAGNOSIS — K219 Gastro-esophageal reflux disease without esophagitis: Secondary | ICD-10-CM

## 2019-07-24 DIAGNOSIS — E291 Testicular hypofunction: Secondary | ICD-10-CM

## 2019-07-24 DIAGNOSIS — J329 Chronic sinusitis, unspecified: Secondary | ICD-10-CM

## 2019-07-24 DIAGNOSIS — R05 Cough: Secondary | ICD-10-CM

## 2019-07-24 DIAGNOSIS — R059 Cough, unspecified: Secondary | ICD-10-CM

## 2019-07-24 MED ORDER — RABEPRAZOLE SODIUM 20 MG PO TBEC: DELAYED_RELEASE_TABLET | ORAL | Status: DC

## 2019-07-24 MED ORDER — TESTOSTERONE CYPIONATE 200 MG/ML IM SOLN
400.0000 mg | Freq: Once | INTRAMUSCULAR | Status: AC
  Administered 2019-07-24: 400 mg via INTRAMUSCULAR

## 2019-07-24 NOTE — Progress Notes (Signed)
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  Not able to work since Oct 2019 on [employer] disability for up to 2 years   Also c/o chronic  abd pain started before the flare cough x several years and never able to stop tums s flare even on max gerd rx. Last egd neg 09/08/17   Original pulmonary eval 01/2013 cough improved on gerd rx/ cyclical cough regimen but persistent throat tickle and abd pain so referred to [facility] in  GI at that point and the problem never resolved per pt.     10/24/2018  Pulmonary/ 1st office eval/Jay Brooks  Chief Complaint  Patient presents with  . Pulmonary Consult    Referred by Jay. Oneta Brooks. Pt c/o cough since Oct 2019. He occ produces clear to brown sputum. Cough tends to be worse at night. Talking alot and laughing can trigger the cough. He has a rescue inhaler that he rarely uses.   breathing is fine unless coughing saba no better  Worse p supper and before bed and does ok sleeping  Cough attributed to tickle in throat tenormin started Sept 2019 but down to 25 mg daily Jay Brooks Reflux v Neurogenic Cough Differentiator Reflux Comments  Do you awaken from a sound sleep coughing violently?                            With trouble breathing? no   Do you have choking episodes when you cannot  Get enough air, gasping for air ?              Yes    Do you usually cough when you lie down  into  The bed, or when you just lie down to rest ?                          No    Do you usually cough after meals or eating?         no   Do you cough when (or after) you bend over?    no   GERD SCORE     Jay Brooks Reflux v Neurogenic Cough Differentiator Neurogenic   Do you more-or-less cough all day long? sporadic   Does change of temperature make you cough? Cool worse   Does laughing or chuckling cause you to cough? yes   Do fumes (perfume, automobile fumes, burned  Toast, etc.,) cause you to cough ?      no   Does speaking, singing, or talking on the phone cause you to cough   ?               talking    Neurogenic/Airway score    rec First take delsym two tsp every  12 hours and supplement if needed with  Tylenol #3  up to 2 every 4 hours to suppress the urge to cough at all or even clear your throat. Prednisone 10 mg take  4 each am x 2 days,   2 each am x 2 days,  1 each am x 2 days and stop (this is to eliminate allergies and inflammation from coughing) Nexium  Take 30-60 min before first and last  Meals of the day and Pepcid 20 mg one bedtime plus chlorpheniramine 4 mg (chlortab at [facility])  x 2 at bedtime (both available over the counter)  until cough is completely gone for at least a week GERD  Diet  If not 100% better > return with all meds in hand    12/18/2018  f/u ov/Jay Brooks re: cough x Oct 2019  Now on BREO / did not bring meds  Chief Complaint  Patient presents with  . Follow-up    Cough still bothering him- non prod.   Dyspnea:  Breathing fine as long as not coughing  Cough: day time/ dry severe to point of gag/ vomit Sleeping: no problem at all with cough at hs but chronically sleeps very poorly  Has to take tums to keep from abd and vomiting  Finds sipping water or lozenges help as much as anything else  Can't tol h1 during the day  No better on singulair in past, nor saba, and now no better on breo or pred just finished today  rec The key to effective treatment for  your cough is eliminating the non-stop cycle of cough  Gabapentin 300 mg three times daily  First take delsym two tsp every 12 hours and supplement if needed with  Tylenol #3  up to 1- 2 every 4 hours to suppress the urge to cough at all or even clear your throat.  Once you have eliminated the cough for 3 straight days try reducing the tylenol #3  first,  then the delsym as tolerated   Prednisone 10 mg take  4 each am x 2 days,   2 each am x 2 days,  1 each am x 2 days and stop (this is to eliminate allergies and inflammation from coughing) Aciphex 20 mg Take 30- 60 min before your first and last meals of the day and Pepcid 40 mg after supper  GERD (REFLUX)  is an extremely common cause of respiratory symptoms just like yours , many times with no obvious heartburn at all.  Please see patient coordinator before you leave today  to schedule  DgEs  Next  Call me 12/24/18 if not cough better as you will need a methacholine challenge      12/28/2018  f/u ov/Jay Brooks re: cough x 0ct 2019/ throat clearing since ???  Dyspnea:  No aerobics but otherwise active Cough: better so stopped gabapentin 12/25/18 ( this was not the rec) still sense of pnds  No longer taking h1 at  All but did feel they helped the pnd  Sleeping: bed block blocks x 3 in SABA use: no inhalers  rec Gabapentin 300 mg three times a day until you return and consider then referral to Jay Brooks   See Jay Brooks asap re abd burning that requires tums up to 6 x times and not affected by eating.  Continue nexium 20 mg x 2   Take 30- 60 min before your first and last meals of the day  Famotadine 20 mg one after supper and one at bedtime  Please schedule a follow up office visit in 6 weeks, call sooner if needed  - to [facility] voice Brooks if not satisfied with cough control on gabapentin 300 tid   04/08/19 ENT eval [facility]/ Jay Brooks dx muscle tension dyphonia  rec ST  "Ultimately he may benefit from an injection augmentation of the vocal folds or a formal  medialization laryngoplasty"> says  could not do ST due to mask restrictions    07/24/2019  f/u ov/Kealy Lewter re: uacs/ hbp now on acei  Chief Complaint  Patient presents with  . Follow-up    discuss PFT. Cough has improved some. He rarely uses his rescue inhaler.   Dyspnea:  Bike riding / mowing even in heat tol fine  Cough: still urge to clear the throat but much better  Sleeping: no resp symptoms/ / sleeps perfectly flat / wakes up feeling good SABA use: very rarely  02: no Working now with Suszanne Conners re "sinus pressure"    No obvious day to day or daytime variability or assoc excess/ purulent sputum or mucus plugs or hemoptysis or cp or chest tightness, subjective wheeze or overt  hb symptoms.   Sleeping ok  without nocturnal  or early am exacerbation  of respiratory  c/o's or need for noct saba. Also denies any obvious fluctuation of symptoms with weather or environmental changes or other aggravating or alleviating factors except as outlined above   No unusual exposure hx or h/o childhood pna/ asthma or knowledge of premature birth.  Current Allergies, Complete Past Medical History, Past Surgical History, Family History, and Social History were reviewed in [employer] record.  ROS  The following are not active complaints unless bolded Hoarseness, sore throat, dysphagia, dental problems, itching, sneezing,  nasal congestion or discharge of excess mucus or purulent secretions, ear ache,   fever, chills, sweats, unintended wt loss or wt gain, classically pleuritic or exertional cp,  orthopnea pnd or arm/hand swelling  or leg swelling, presyncope, palpitations, abdominal pain, anorexia, nausea, vomiting, diarrhea  or change in bowel habits or change in bladder habits, change in stools or change in urine, dysuria, hematuria,  rash, arthralgias, visual complaints, headache, numbness, weakness or ataxia or problems with walking or coordination,  change in mood or  memory.         Current Meds  Medication Sig  . acetaminophen (TYLENOL) 500 MG tablet Take 500 mg by mouth every 6 (six) hours as needed for moderate pain or headache.  Marland Kitchen atenolol (TENORMIN) 50 MG tablet Take 1 tablet every Morning for BP  . Azelastine HCl 137 MCG/SPRAY SOLN Place 2 sprays into both nostrils daily.  . famotidine (PEPCID) 20 MG tablet 2 after supper (Patient taking differently: Take 40 mg by mouth at bedtime. )  . fluticasone (FLONASE) 50 MCG/ACT nasal spray Place 2 sprays into both nostrils daily.   Marland Kitchen ipratropium (ATROVENT) 0.06 % nasal spray Place 1 spray into both nostrils daily.  Marland Kitchen lisinopril (ZESTRIL) 20 MG tablet Take 1 tablet every Night  for BP  . OVER THE COUNTER MEDICATION Take 2 capsules by mouth at bedtime. serenagen otc supplement  . phenylephrine (SUDAFED PE) 10 MG TABS tablet Take 10 mg by mouth every 4 (four) hours as needed.  Marland Kitchen QVAR REDIHALER 80 MCG/ACT inhaler Inhale 2 puffs into the lungs daily.  . RABEprazole (ACIPHEX) 20 MG tablet TAKE ONE TABLET BY MOUTH TWICE DAILY   . tamsulosin (FLOMAX) 0.4 MG CAPS capsule TAKE 1 CAPSULE (0.4 MG TOTAL) BY  MOUTH DAILY AFTER SUPPER. (Patient taking differently: Take 0.4 mg by mouth daily. )  . testosterone cypionate (DEPOTESTOSTERONE CYPIONATE) 200 MG/ML injection INJECT INTO THE MUSCLE EVERY 2 WEEKS  . triamcinolone cream (KENALOG) 0.1 % Apply 1 application topically 2 (two) times daily as needed for itching.  Marland Kitchen UNABLE TO FIND Take 1 tablet by mouth daily. sernegen                Objective:       07/24/2019       163  12/28/2018       175   12/18/18 174 lb (78.9 kg)  12/10/18 175 lb (79.4 kg)  11/16/18 173 lb 3.2 oz (78.6 kg)     amb pleasant wm nad  Vital signs reviewed - Note on arrival 02 sats  99% on RA    HEENT : pt wearing mask not removed for exam due to covid -19 concerns.    NECK :  without JVD/Nodes/TM/ nl carotid upstrokes bilaterally   LUNGS: no acc muscle use,  Nl contour chest which is clear to A  and P bilaterally without cough on insp or exp maneuvers   CV:  RRR  no s3 or murmur or increase in P2, and no edema   ABD:  soft and nontender with nl inspiratory excursion in the supine position. No bruits or organomegaly appreciated, bowel sounds nl  MS:  Nl gait/ ext warm without deformities, calf tenderness, cyanosis or clubbing No obvious joint restrictions   SKIN: warm and dry without lesions    NEURO:  alert, approp, nl sensorium with  no motor or cerebellar deficits apparent.          Assessment

## 2019-07-24 NOTE — Patient Instructions (Addendum)
Only use your albuterol as a rescue medication to be used if you can't catch your breath by resting or doing a relaxed purse lip breathing pattern.  - The less you use it, the better it will work when you need it. - Ok to use up to 2 puffs  every 4 hours if you must but call for immediate appointment if use goes up over your usual need - Don't leave home without it !!  (think of it like the spare tire for your car)   Work on inhaler technique:  relax and gently blow all the way out then take a nice smooth deep breath back in, triggering the inhaler at same time you start breathing in.  Hold for up to 5 seconds if you can.  Rinse and gargle with water when done   A methacholine challenge test is the only  way to prove you don't have asthma but I don't believe it's indicated  Your problem is called upper airway cough syndrome which can be made worse by reflux, by zestoril and by certain inhalers (the powdered are worst)   GERD (REFLUX)  is an extremely common cause of respiratory symptoms just like yours , many times with no obvious heartburn at all.    It can be treated with medication, but also with lifestyle changes including elevation of the head of your bed (ideally with 6 -8inch blocks under the headboard of your bed),  Smoking cessation, avoidance of late meals, excessive alcohol, and avoid fatty foods, chocolate, peppermint, colas, red wine, and acidic juices such as orange juice.  NO MINT OR MENTHOL PRODUCTS SO NO COUGH DROPS  USE SUGARLESS CANDY INSTEAD (Jolley ranchers or Stover's or Life Savers) or even ice chips will also do - the key is to swallow to prevent all throat clearing. NO OIL BASED VITAMINS - use powdered substitutes.  Avoid fish oil when coughing.    Recs: Continue aciphex / pepcid/ diet and consider bed blocks and follow up with Ardis Hughs  Stop zestoril and start micardis 80 mg one daily - call me if your blood pressure is not satisfactory You may need to change tenormin  to an alternative Beta blocker called bisoprolol  We will send a copy of your pfts to Dr Orvil Feil  Pulmonary follow up is as needed

## 2019-07-24 NOTE — Progress Notes (Signed)
Patient is here for a NV for his Testosterone Cypionate 200 mg/ml 2 ml IM injection in his left upper outer quadrant.  Patient tolerated well and will return in 3 weeks for his next injection.

## 2019-07-25 ENCOUNTER — Encounter: Payer: Self-pay | Admitting: Internal Medicine

## 2019-07-25 MED ORDER — TELMISARTAN 80 MG PO TABS: 80.0000 mg | ORAL_TABLET | Freq: Every day | ORAL | 11 refills | Status: DC

## 2019-07-25 NOTE — Assessment & Plan Note (Signed)
D/c acei 07/24/2019 due to UACS  ACE inhibitors are problematic in  pts with airway complaints because  even experienced pulmonologists can't always distinguish ace effects from copd/asthma.  By themselves they don't actually cause a problem, much like oxygen can't by itself start a fire, but they certainly serve as a powerful catalyst or enhancer for any "fire"  or inflammatory process in the upper airway, be it caused by an ET  tube or more commonly reflux (especially in the obese or pts with known GERD or who are on biphoshonates).    In the era of ARB near equivalency until we have a better handle on the reversibility of the airway problem, it just makes sense to avoid ACEI  entirely in the short run and then decide later, having established a level of airway control using a reasonable limited regimen, whether to add back ace but even then being very careful to observe the pt for worsening airway control and number of meds used/ needed to control symptoms.  >> rec trial of micardis 80 mg one daily > f/u PCP

## 2019-07-25 NOTE — Assessment & Plan Note (Addendum)
Originally developed around 2014 with persistent sense of pnds since and not resolved with rx by Orvil Feil but declined allergy shots Severe flare Onset oct 2019 > unable to work as Insurance underwriter since  - Cyclical cough rx [id number]  - HRCT chest 11/02/18  1. No evidence of interstitial lung disease. 2. Scattered tiny subpleural pulmonary nodules at the peripheral right lung base, largest 3 mm. No follow-up needed if patient is low-risk = never smoker, no known primary > no f/u needed  - Repeat cyclical cough rx [id number] and add gabapentin 300 tid > ? Some better but caused brain fog 04/08/19 ENT eval [facility]/ Dr Joya Gaskins dx muscle tension dyphonia  rec ST  "Ultimately he may benefit from an injection augmentation of the vocal folds or a formal medialization laryngoplasty"> says  could not do ST due to mask restrictions - PFTs  07/10/2019 nl x for variable f/v loop suggestive of vcd while on ACEi > rec d/c 07/24/2019  (see hbp)  - Sinus CT 07/24/2019 1. Minimal scattered paranasal sinus mucosal thickening. No fluid. 2. Leftward nasal septal deviation.    I had an extended final summary discussion with the patient reviewing all relevant studies completed to date re:  I see no evidence of asthma clinically and even if it is present it must be very mild as doing fine off ics with minimal perceived need for saba and excellent ex tolerance  The cough is typical of Upper airway cough syndrome (previously labeled PNDS),  is so named because it's frequently impossible to sort out how much is  CR/sinusitis with freq throat clearing (which can be related to primary GERD)   vs  causing  secondary (" extra esophageal")  GERD from wide swings in gastric pressure that occur with throat clearing, often  promoting self use of mint and menthol lozenges that reduce the lower esophageal sphincter tone and exacerbate the problem further in a cyclical fashion.   These are the same pts (now being labeled as having "irritable larynx  syndrome" by some cough centers) who not infrequently have a history of having failed to tolerate ace inhibitors,  dry powder inhalers or biphosphonates or report having atypical/extraesophageal reflux symptoms that don't respond to standard doses of PPI  and are easily confused as having aecopd or asthma flares by even experienced allergists/ pulmonologists (myself included).     Although he is better for now, needs to avoid acei max rx gerd to include bed blocks, and f/u with Teoh re sinus issues and Orvil Feil re rx for rhinitis see avs for instructions unique to this ov    > 50% of this 40 min summary f/u ov spent on counseling  I performed device teaching  using a teach back technique which also  extended face to face time for this visit re optimal use of hfa saba  as rescue prn in the event that a  component of cough variant asthma occurs

## 2019-07-25 NOTE — Assessment & Plan Note (Signed)
DgEs 12/21/2018  No evidence for hiatal hernia. No episodes of gastroesophageal reflux were observed during the study (did not cough during study per pt) . Disruption of primary peristalsis on some swallows consistent with nonspecific esophageal motility disorder.   rec add bed blocks, f/u GI/ Ardis Hughs as planned

## 2019-08-01 ENCOUNTER — Other Ambulatory Visit: Payer: Self-pay | Admitting: Adult Health

## 2019-08-13 ENCOUNTER — Encounter: Payer: Self-pay | Admitting: Internal Medicine

## 2019-08-13 DIAGNOSIS — I10 Essential (primary) hypertension: Secondary | ICD-10-CM | POA: Insufficient documentation

## 2019-08-13 DIAGNOSIS — Z8249 Family history of ischemic heart disease and other diseases of the circulatory system: Secondary | ICD-10-CM | POA: Insufficient documentation

## 2019-08-13 NOTE — Progress Notes (Signed)
Annual  Screening/Preventative Visit  & Comprehensive Evaluation & Examination     This very nice 58 y.o.  MWM presents for a Screening /Preventative Visit & comprehensive evaluation and management of multiple medical co-morbidities.  Patient has been followed for HTN, HLD, Prediabetes, Testosterone Deficiency and Vitamin D Deficiency.     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 Bravo test per Dr Ardis Hughs did not reveal significant acid reflux and did not feel his cough was related to GERD. He also was seen by ENT, Dr Carol Ada w/o any ENT pathology identified. Allergy Evaluation by Dr Orvil Feil was likewise unrevealing. Patient reports PFT's per Dr Melvyn Novas did not reveal Asthma and empiric treatment with MDI's did not improve his alleged cough.  Patient has been out of work per Dr Melvyn Novas & on Disability for almost a year since last Oct 2019 for Allergic Rhinitis & 'Upper Airway Cough Syndrome'. 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      HTN predates circa 2005. Patient's BP has been controlled at home.  Today's BP is at goal - 120/72. Patient denies any cardiac symptoms as chest pain, palpitations, shortness of breath, dizziness or ankle swelling.     Patient's hyperlipidemia is controlled with diet and medications. Patient denies myalgias or other medication SE's. Last lipids were at goal albeit elevated Trig's:  Lab Results  Component Value Date   CHOL 160 01/30/2019   HDL 33 (L) 01/30/2019   LDLCALC 90 01/30/2019   TRIG 304 (H) 01/30/2019   CHOLHDL 4.8 01/30/2019      Patient has hx/o prediabetes / Insulin Resistance (A1c 5.0% / elevated Insulin "55" / 2014)  and patient denies reactive hypoglycemic symptoms,  visual blurring, diabetic polys or paresthesias. Last A1c was Normal & at goal:  Lab Results  Component Value Date   HGBA1C 5.1 07/30/2018       Patient is on replacement for Testosterone Deficiency ("150" / 2010)   by injection with improved stamina & sense of well being.     Finally, patient has history of Vitamin D Deficiency ("32" / 2009)  and last vitamin D was still slightly low (goal 70-100):  Lab Results  Component Value Date   VD25OH 49 07/30/2018   Current Outpatient Medications on File Prior to Visit  Medication Sig  . acetaminophen (TYLENOL) 500 MG tablet Take 500 mg by mouth every 6 (six) hours as needed for moderate pain or headache.  Marland Kitchen atenolol (TENORMIN) 50 MG tablet Take 1 tablet every Morning for BP  . Azelastine HCl 137 MCG/SPRAY SOLN Place 2 sprays into both nostrils daily.  . famotidine (PEPCID) 20 MG tablet 2 after supper (Patient taking differently: Take 40 mg by mouth at bedtime. )  . fluticasone (FLONASE) 50 MCG/ACT nasal spray Place 2 sprays into both nostrils daily.   Marland Kitchen ipratropium (ATROVENT) 0.06 % nasal spray Place 1 spray into both nostrils daily.  Marland Kitchen OVER THE COUNTER MEDICATION Take 2 capsules by mouth at bedtime. serenagen otc supplement  . phenylephrine (SUDAFED PE) 10 MG TABS tablet Take 10 mg by mouth every 4 (four) hours as needed.  . RABEprazole (ACIPHEX) 20 MG tablet Take 30-60 min before first meal of the  day  . tamsulosin (FLOMAX) 0.4 MG CAPS capsule Take 1 capsule at Bedtime for Prostate  . telmisartan (MICARDIS) 80 MG tablet Take 1 tablet (80 mg total) by mouth daily.  Marland Kitchen testosterone cypionate (DEPOTESTOSTERONE CYPIONATE) 200 MG/ML injection INJECT 2ML INTO THE MUSCLE EVERY 2 WEEKS  . triamcinolone cream (KENALOG) 0.1 % Apply 1 application topically 2 (two) times daily as needed for itching.  Marland Kitchen UNABLE TO FIND Take 1 tablet by mouth daily. sernegen   No current facility-administered medications on file prior to visit.    Allergies  Allergen  Reactions  . Allegra [Fexofenadine] Other (See Comments)    Worsened insomnia  . Benzoyl Peroxide     blisters  . Doxycycline Nausea Only  . Levaquin [Levofloxacin] Nausea And Vomiting    Patient states that after he took levaquin this past time he had nausea with vomiting   Past Medical History:  Diagnosis Date  . Allergy   . Chronic cough   . Chronic sinusitis   . Diverticulitis 2018   Mild  . Diverticulosis of colon (without mention of hemorrhage)   . Early satiety   . Esophageal motility disorder 12/2018  . Family history of adverse reaction to anesthesia    mom has PONV  . GERD (gastroesophageal reflux disease)   . History of left inguinal hernia   . Hypertension   . Insomnia   . Lyme disease    QUESTIONABLE  . Pulmonary nodules 10/2018   Scattered tiny subpleural pulmonary nodules at the peripheral right lung base, largest 3 mm  . Right inguinal hernia 2017   Small fat containing right inguinal hernia  . Sleep deficient   . Testosterone deficiency   . Vitamin D deficiency   . Wears glasses    Health Maintenance  Topic Date Due  . Hepatitis C Screening  [birth year]  . HIV Screening  02/04/1974  . COLONOSCOPY  02/04/2009  . INFLUENZA VACCINE  06/08/2019  . TETANUS/TDAP  07/08/2020   Immunization History  Administered Date(s) Administered  . Influenza Inj Mdck Quad With Preservative 08/29/2017, 07/30/2018  . Influenza Split 08/07/2012, 09/25/2014  . Influenza, Seasonal, Injecte, Preservative Fre 09/25/2015  . Influenza,inj,quad, With Preservative 09/15/2016  . PPD Test 12/01/2014, 12/08/2015, 07/11/2017, 07/30/2018  . Tdap 07/08/2010   Last Colon - 12/21/2010 - Dr Ardis Hughs - Recc 10 yr f/u - due Feb 2022  Past Surgical History:  Procedure Laterality Date  . BRAVO [place] STUDY N/A 05/02/2019   Procedure: BRAVO [place] STUDY;  Surgeon: Milus Banister, MD;  Location: [facility];  Service: Endoscopy;  Laterality: N/A;  . COLONOSCOPY    . ESOPHAGOGASTRODUODENOSCOPY  (EGD) WITH PROPOFOL N/A 05/02/2019   Procedure: ESOPHAGOGASTRODUODENOSCOPY (EGD) WITH PROPOFOL;  Surgeon: Milus Banister, MD;  Location: [facility];  Service: Endoscopy;  Laterality: N/A;  . HERNIA REPAIR    . TOE SURGERY     INGROWN TOE NAIL, CHILDHOOD  . UPPER GASTROINTESTINAL ENDOSCOPY    . VASECTOMY     Family History  Problem Relation Age of Onset  . Breast cancer Maternal Grandmother   . Colon polyps Brother   . Colon polyps Maternal Uncle   . Cancer Mother 74       pancreatic  . Colon cancer Neg Hx   . Esophageal cancer Neg Hx   . Stomach cancer Neg Hx   . Ulcerative colitis Neg Hx    Social History   Socioeconomic History  . Marital status: Married    Spouse name: Maudie Mercury  .  Number of children: 1 daughter  . Years of education: Not on file  . Highest education level: Not on file  Occupational History  . Occupation: PILOT    Employer: [employer]  Tobacco Use  . Smoking status: Never Smoker  . Smokeless tobacco: Never Used  Substance and Sexual Activity  . Alcohol use: Not Currently    Comment: occ  . Drug use: No  . Sexual activity: Not on file  Social History Narrative   Daily caffeine     ROS Constitutional: Denies fever, chills, weight loss/gain, headaches, insomnia,  night sweats or change in appetite. Does c/o fatigue. Eyes: Denies redness, blurred vision, diplopia, discharge, itchy or watery eyes.  ENT: Denies discharge, congestion, post nasal drip, epistaxis, sore throat, earache, hearing loss, dental pain, Tinnitus, Vertigo, Sinus pain or snoring.  Cardio: Denies chest pain, palpitations, irregular heartbeat, syncope, dyspnea, diaphoresis, orthopnea, PND, claudication or edema Respiratory: denies cough, dyspnea, DOE, pleurisy, hoarseness, laryngitis or wheezing.  Gastrointestinal: Denies dysphagia, heartburn, reflux, water brash, pain, cramps, nausea, vomiting, bloating, diarrhea, constipation, hematemesis, melena, hematochezia, jaundice or  hemorrhoids Genitourinary: Denies dysuria, frequency, urgency, nocturia, hesitancy, discharge, hematuria or flank pain Musculoskeletal: Denies arthralgia, myalgia, stiffness, Jt. Swelling, pain, limp or strain/sprain. Denies Falls. Skin: Denies puritis, rash, hives, warts, acne, eczema or change in skin lesion Neuro: No weakness, tremor, incoordination, spasms, paresthesia or pain Psychiatric: Denies confusion, memory loss or sensory loss. Denies Depression. Endocrine: Denies change in weight, skin, hair change, nocturia, and paresthesia, diabetic polys, visual blurring or hyper / hypo glycemic episodes.  Heme/Lymph: No excessive bleeding, bruising or enlarged lymph nodes.  Physical Exam  BP 120/72   Pulse 60   Temp (!) 97.5 F (36.4 C)   Resp 16   Ht 5\' 9"  (1.753 m)   Wt 166 lb 12.8 oz (75.7 kg)   BMI 24.63 kg/m   General Appearance: Well nourished and well groomed and in no apparent distress.  Eyes: PERRLA, EOMs, conjunctiva no swelling or erythema, normal fundi and vessels. Sinuses: No frontal/maxillary tenderness ENT/Mouth: EACs patent / TMs  nl. Nares clear without erythema, swelling, mucoid exudates. Oral hygiene is good. No erythema, swelling, or exudate. Tongue normal, non-obstructing. Tonsils not swollen or erythematous. Hearing normal.  Neck: Supple, thyroid not palpable. No bruits, nodes or JVD. Respiratory: Respiratory effort normal.  BS equal and clear bilateral without rales, rhonci, wheezing or stridor. Cardio: Heart sounds are normal with regular rate and rhythm and no murmurs, rubs or gallops. Peripheral pulses are normal and equal bilaterally without edema. No aortic or femoral bruits. Chest: symmetric with normal excursions and percussion.  Abdomen: Soft, with Nl bowel sounds. Nontender, no guarding, rebound, hernias, masses, or organomegaly.  Lymphatics: Non tender without lymphadenopathy.  Musculoskeletal: Full ROM all peripheral extremities, joint stability, 5/5  strength, and normal gait. Skin: Warm and dry without rashes, lesions, cyanosis, clubbing or  ecchymosis.  Neuro: Cranial nerves intact, reflexes equal bilaterally. Normal muscle tone, no cerebellar symptoms. Sensation intact.  Pysch: Alert and oriented X 3 with normal affect, insight and judgment appropriate.   Assessment and Plan  1. Annual Preventative/Screening Exam   1. Encounter for general adult medical examination with abnormal findings   2. Essential hypertension  - EKG 12-Lead - [place], RETROPERITNL ABD,  LTD - Urinalysis, Routine w reflex microscopic - Microalbumin / creatinine urine ratio - CBC with Differential/Platelet - COMPLETE METABOLIC PANEL WITH GFR - Magnesium - TSH - olmesartan (BENICAR) 40 MG tablet; Take 1 tablet Daily for  BP  Dispense: 90 tablet; Refill: 3  3. Hyperlipidemia, mixed  - EKG 12-Lead - [place], RETROPERITNL ABD,  LTD - Lipid panel - TSH  4. Abnormal glucose  - EKG 12-Lead - [place], RETROPERITNL ABD,  LTD - Hemoglobin A1c - Insulin, random  5. Vitamin D deficiency  - VITAMIN D 25 Hydroxy (Vit-D Deficiency, Fractures)  6. Testosterone Deficiency  - Testosterone - testosterone cypionate (DEPOTESTOSTERONE CYPIONATE) injection 400 mg  7. Screening for colorectal cancer  - POC Hemoccult Bld/Stl   8. BPH with obstruction/lower urinary tract symptoms  - PSA  9. Need for prophylactic vaccination and inoculation against influenza  - FLU VACCINE MDCK QUAD W/Preservative  10. Screening-pulmonary TB  - TB Skin Test  11. Prostate cancer screening  - PSA  12. Screening for ischemic heart disease  - EKG 12-Lead  13. FHx: heart disease  - EKG 12-Lead - [place], RETROPERITNL ABD,  LTD  14. Screening for AAA (aortic abdominal aneurysm)  - [place], RETROPERITNL ABD,  LTD  15. Fatigue, unspecified type  - Iron,Total/Total Iron Binding Cap - Vitamin B12 - CBC with Differential/Platelet - TSH  16. Medication management  - Urinalysis,  Routine w reflex microscopic - Microalbumin / creatinine urine ratio - Testosterone - CBC with Differential/Platelet - COMPLETE METABOLIC PANEL WITH GFR - Magnesium - Lipid panel - TSH - Hemoglobin A1c - Insulin, random - VITAMIN D 25 Hydroxyl             Patient was counseled in prudent diet, weight control to achieve/maintain BMI less than 25, BP monitoring, regular exercise and medications as discussed.  Discussed med effects and SE's. Routine screening labs and tests as requested with regular follow-up as recommended. Over 40 minutes of exam, counseling, chart review and high complex critical decision making was performed   Kirtland Bouchard, MD

## 2019-08-13 NOTE — Patient Instructions (Signed)

## 2019-08-14 ENCOUNTER — Ambulatory Visit (INDEPENDENT_AMBULATORY_CARE_PROVIDER_SITE_OTHER): Payer: 59 | Admitting: Internal Medicine

## 2019-08-14 ENCOUNTER — Other Ambulatory Visit: Payer: Self-pay

## 2019-08-14 VITALS — BP 120/72 | HR 60 | Temp 97.5°F | Resp 16 | Ht 69.0 in | Wt 166.8 lb

## 2019-08-14 DIAGNOSIS — Z1211 Encounter for screening for malignant neoplasm of colon: Secondary | ICD-10-CM

## 2019-08-14 DIAGNOSIS — E291 Testicular hypofunction: Secondary | ICD-10-CM

## 2019-08-14 DIAGNOSIS — Z111 Encounter for screening for respiratory tuberculosis: Secondary | ICD-10-CM

## 2019-08-14 DIAGNOSIS — Z8249 Family history of ischemic heart disease and other diseases of the circulatory system: Secondary | ICD-10-CM

## 2019-08-14 DIAGNOSIS — R5383 Other fatigue: Secondary | ICD-10-CM

## 2019-08-14 DIAGNOSIS — Z0001 Encounter for general adult medical examination with abnormal findings: Secondary | ICD-10-CM

## 2019-08-14 DIAGNOSIS — I10 Essential (primary) hypertension: Secondary | ICD-10-CM | POA: Diagnosis not present

## 2019-08-14 DIAGNOSIS — E559 Vitamin D deficiency, unspecified: Secondary | ICD-10-CM

## 2019-08-14 DIAGNOSIS — Z0000 Encounter for general adult medical examination without abnormal findings: Secondary | ICD-10-CM

## 2019-08-14 DIAGNOSIS — Z136 Encounter for screening for cardiovascular disorders: Secondary | ICD-10-CM | POA: Diagnosis not present

## 2019-08-14 DIAGNOSIS — Z23 Encounter for immunization: Secondary | ICD-10-CM

## 2019-08-14 DIAGNOSIS — Z125 Encounter for screening for malignant neoplasm of prostate: Secondary | ICD-10-CM

## 2019-08-14 DIAGNOSIS — N401 Enlarged prostate with lower urinary tract symptoms: Secondary | ICD-10-CM

## 2019-08-14 DIAGNOSIS — R7309 Other abnormal glucose: Secondary | ICD-10-CM

## 2019-08-14 DIAGNOSIS — Z79899 Other long term (current) drug therapy: Secondary | ICD-10-CM

## 2019-08-14 DIAGNOSIS — N138 Other obstructive and reflux uropathy: Secondary | ICD-10-CM

## 2019-08-14 DIAGNOSIS — E782 Mixed hyperlipidemia: Secondary | ICD-10-CM

## 2019-08-14 MED ORDER — OLMESARTAN MEDOXOMIL 40 MG PO TABS: ORAL_TABLET | ORAL | 3 refills | Status: DC

## 2019-08-14 MED ORDER — TESTOSTERONE CYPIONATE 200 MG/ML IM SOLN
400.0000 mg | Freq: Once | INTRAMUSCULAR | Status: AC
  Administered 2019-08-14: 400 mg via INTRAMUSCULAR

## 2019-08-15 LAB — CBC WITH DIFFERENTIAL/PLATELET
Absolute Monocytes: 909 cells/uL (ref 200–950)
Basophils Absolute: 32 cells/uL (ref 0–200)
Basophils Relative: 0.4 %
Eosinophils Absolute: 47 cells/uL (ref 15–500)
Eosinophils Relative: 0.6 %
HCT: 42.9 % (ref 38.5–50.0)
Hemoglobin: 14.7 g/dL (ref 13.2–17.1)
Lymphs Abs: 1177 cells/uL (ref 850–3900)
MCH: 32.5 pg (ref 27.0–33.0)
MCHC: 34.3 g/dL (ref 32.0–36.0)
MCV: 94.9 fL (ref 80.0–100.0)
MPV: 9.4 fL (ref 7.5–12.5)
Monocytes Relative: 11.5 %
Neutro Abs: 5735 cells/uL (ref 1500–7800)
Neutrophils Relative %: 72.6 %
Platelets: 146 10*3/uL (ref 140–400)
RBC: 4.52 10*6/uL (ref 4.20–5.80)
RDW: 13.1 % (ref 11.0–15.0)
Total Lymphocyte: 14.9 %
WBC: 7.9 10*3/uL (ref 3.8–10.8)

## 2019-08-15 LAB — IRON, TOTAL/TOTAL IRON BINDING CAP
%SAT: 20 % (calc) (ref 20–48)
Iron: 61 ug/dL (ref 50–180)
TIBC: 298 mcg/dL (calc) (ref 250–425)

## 2019-08-15 LAB — URINALYSIS, ROUTINE W REFLEX MICROSCOPIC
Bilirubin Urine: NEGATIVE
Glucose, UA: NEGATIVE
Hgb urine dipstick: NEGATIVE
Ketones, ur: NEGATIVE
Leukocytes,Ua: NEGATIVE
Nitrite: NEGATIVE
Protein, ur: NEGATIVE
Specific Gravity, Urine: 1.012 (ref 1.001–1.03)
pH: 5.5 (ref 5.0–8.0)

## 2019-08-15 LAB — MAGNESIUM: Magnesium: 2.2 mg/dL (ref 1.5–2.5)

## 2019-08-15 LAB — COMPLETE METABOLIC PANEL WITH GFR
AG Ratio: 2 (calc) (ref 1.0–2.5)
ALT: 15 U/L (ref 9–46)
AST: 17 U/L (ref 10–35)
Albumin: 4.5 g/dL (ref 3.6–5.1)
Alkaline phosphatase (APISO): 42 U/L (ref 35–144)
BUN: 19 mg/dL (ref 7–25)
CO2: 29 mmol/L (ref 20–32)
Calcium: 9.1 mg/dL (ref 8.6–10.3)
Chloride: 100 mmol/L (ref 98–110)
Creat: 1.03 mg/dL (ref 0.70–1.25)
GFR, Est African American: 91 mL/min/{1.73_m2} (ref >=60)
GFR, Est Non African American: 79 mL/min/{1.73_m2} (ref >=60)
Globulin: 2.3 g/dL (calc) (ref 1.9–3.7)
Glucose, Bld: 75 mg/dL (ref 65–99)
Potassium: 4 mmol/L (ref 3.5–5.3)
Sodium: 136 mmol/L (ref 135–146)
Total Bilirubin: 0.7 mg/dL (ref 0.2–1.2)
Total Protein: 6.8 g/dL (ref 6.1–8.1)

## 2019-08-15 LAB — HEMOGLOBIN A1C
Hgb A1c MFr Bld: 5.1 % of total Hgb (ref <5.7)
Mean Plasma Glucose: 100 (calc)
eAG (mmol/L): 5.5 (calc)

## 2019-08-15 LAB — LIPID PANEL
Cholesterol: 165 mg/dL (ref <200)
HDL: 39 mg/dL — ABNORMAL LOW (ref >=40)
LDL Cholesterol (Calc): 93 mg/dL (calc)
Non-HDL Cholesterol (Calc): 126 mg/dL (calc) (ref <130)
Total CHOL/HDL Ratio: 4.2 (calc) (ref <5.0)
Triglycerides: 238 mg/dL — ABNORMAL HIGH (ref <150)

## 2019-08-15 LAB — VITAMIN B12: Vitamin B-12: 502 pg/mL (ref 200–1100)

## 2019-08-15 LAB — MICROALBUMIN / CREATININE URINE RATIO
Creatinine, Urine: 74 mg/dL (ref 20–320)
Microalb Creat Ratio: 3 mcg/mg creat (ref <30)
Microalb, Ur: 0.2 mg/dL

## 2019-08-15 LAB — TSH: TSH: 2.6 mIU/L (ref 0.40–4.50)

## 2019-08-15 LAB — TESTOSTERONE: Testosterone: 642 ng/dL (ref 250–827)

## 2019-08-15 LAB — PSA: PSA: 0.8 ng/mL (ref <=4.0)

## 2019-08-15 LAB — INSULIN, RANDOM: Insulin: 6 u[IU]/mL

## 2019-08-15 LAB — VITAMIN D 25 HYDROXY (VIT D DEFICIENCY, FRACTURES): Vit D, 25-Hydroxy: 60 ng/mL (ref 30–100)

## 2019-08-17 ENCOUNTER — Encounter: Payer: Self-pay | Admitting: Internal Medicine

## 2019-08-17 ENCOUNTER — Other Ambulatory Visit: Payer: Self-pay | Admitting: Internal Medicine

## 2019-08-19 LAB — TB SKIN TEST
Induration: 0 mm
TB Skin Test: NEGATIVE

## 2019-09-02 ENCOUNTER — Other Ambulatory Visit: Payer: Self-pay

## 2019-09-02 DIAGNOSIS — Z1212 Encounter for screening for malignant neoplasm of rectum: Secondary | ICD-10-CM

## 2019-09-02 DIAGNOSIS — Z1211 Encounter for screening for malignant neoplasm of colon: Secondary | ICD-10-CM

## 2019-09-02 LAB — POC HEMOCCULT BLD/STL (HOME/3-CARD/SCREEN)
Card #2 Fecal Occult Blod, POC: NEGATIVE
Card #3 Fecal Occult Blood, POC: NEGATIVE
Fecal Occult Blood, POC: NEGATIVE

## 2019-09-03 DIAGNOSIS — Z1212 Encounter for screening for malignant neoplasm of rectum: Secondary | ICD-10-CM

## 2019-09-17 ENCOUNTER — Ambulatory Visit: Payer: 59 | Admitting: Neurology

## 2019-10-01 ENCOUNTER — Other Ambulatory Visit: Payer: Self-pay

## 2019-10-01 ENCOUNTER — Ambulatory Visit (INDEPENDENT_AMBULATORY_CARE_PROVIDER_SITE_OTHER): Payer: 59 | Admitting: *Deleted

## 2019-10-01 VITALS — BP 108/62 | HR 60 | Temp 97.1°F | Resp 16 | Wt 169.2 lb

## 2019-10-01 DIAGNOSIS — E291 Testicular hypofunction: Secondary | ICD-10-CM

## 2019-10-01 MED ORDER — TESTOSTERONE CYPIONATE 200 MG/ML IM SOLN
400.0000 mg | Freq: Once | INTRAMUSCULAR | Status: AC
  Administered 2019-10-01: 400 mg via INTRAMUSCULAR

## 2019-10-01 NOTE — Progress Notes (Signed)
Patient is here for his Testosterone Cypionate 200 mg/ml 2 ml IM injection in the right upper outer quadrant. Patient tolerated well. 

## 2019-10-21 ENCOUNTER — Other Ambulatory Visit: Payer: Self-pay

## 2019-10-21 DIAGNOSIS — Z20822 Contact with and (suspected) exposure to covid-19: Secondary | ICD-10-CM

## 2019-10-23 LAB — NOVEL CORONAVIRUS, NAA: SARS-CoV-2, NAA: NOT DETECTED

## 2019-10-24 ENCOUNTER — Ambulatory Visit (INDEPENDENT_AMBULATORY_CARE_PROVIDER_SITE_OTHER): Payer: 59

## 2019-10-24 ENCOUNTER — Ambulatory Visit: Payer: 59

## 2019-10-24 ENCOUNTER — Other Ambulatory Visit: Payer: Self-pay

## 2019-10-24 VITALS — BP 106/60 | HR 83 | Temp 97.3°F | Wt 169.0 lb

## 2019-10-24 DIAGNOSIS — E291 Testicular hypofunction: Secondary | ICD-10-CM | POA: Diagnosis not present

## 2019-10-24 MED ORDER — TESTOSTERONE CYPIONATE 200 MG/ML IM SOLN
200.0000 mg | Freq: Once | INTRAMUSCULAR | Status: AC
  Administered 2019-10-24: 200 mg via INTRAMUSCULAR

## 2019-10-24 NOTE — Progress Notes (Signed)
Patient is here for his Testosterone Cypionate 200 mg/ml 2 ml IM injection in the LEFT upper outer quadrant. Patient tolerated well.

## 2019-10-28 ENCOUNTER — Ambulatory Visit: Payer: 59 | Attending: Internal Medicine

## 2019-10-28 DIAGNOSIS — Z20822 Contact with and (suspected) exposure to covid-19: Secondary | ICD-10-CM

## 2019-10-29 LAB — NOVEL CORONAVIRUS, NAA: SARS-CoV-2, NAA: NOT DETECTED

## 2019-11-04 NOTE — Patient Instructions (Addendum)
   Gargle with warm salt water and spit out.  Take zyrtec (cetirzine) every night.  This will help to dry up fluid.    Start taking Norel Ad every 4-6 hours.  This will help with sinus congestion as well as drainage.  If this works well for you we can send in a prescription.  Since the sore throat has been going on for the extened length of time we will send in azithromycin.  Take two tablets now and then one tablet daily until finished.  Increase water intake 64-80oz of water.  Continue supportive measures and treat symptoms.  Continue nasal spray.  Use Neti pot sinus rinse twice a day.  Use distilled or bottled water, NOT TAP.  This will help to open [place] sooth your sinuses.

## 2019-11-04 NOTE — Progress Notes (Addendum)
Assessment and Plan:  There are no diagnoses linked to this encounter.   Jay Brooks was seen today for acute visit.  Diagnoses and all orders for this visit:  Sore throat -     POCT rapid strep A Negative -     cetirizine (ZYRTEC ALLERGY) 10 MG tablet; Take 1 tablet (10 mg total) by mouth daily. likely from drainage.  Chronic pansinusitis Treatment based on duration of symptoms -     azithromycin (ZITHROMAX) 250 MG tablet; Take 2 tablets PO on day 1, then 1 tablet PO Q24H x 4 days Discussed neti pot use, DO NOT USE TAP WATER, BID Norel AD, monitor blood pressure  Chronic cough Likely from drainage -     cetirizine (ZYRTEC ALLERGY) 10 MG tablet; Take 1 tablet (10 mg total) by mouth daily. Reinforced it is ok to take while he is not working, no plans to go back to work at this time until after neurology follow up.  Essential hypertension Taking Amlodapine 2.5mg  daily Continue to monitor blood pressure Follow up with Cariology   Further disposition pending results of labs. Discussed med's effects and SE's.   Over 30 minutes of exam, counseling, chart review, and critical decision making was performed.   Future Appointments  Date Time Provider [place]  11/18/2019  9:30 AM Liane Comber, NP [facility] None  02/19/2020  9:30 AM Unk Pinto, MD [facility] None  09/04/2020  9:00 AM Unk Pinto, MD [facility] None    ------------------------------------------------------------------------------------------------------------------   HPI 58 y.o.male presents for evaluation of white stuff in his mouth.  He reports that this began almost a month ago with sore throat.  He reports that he was using Qvar inhaler and that caused thrush despite rinsing his mouth with water.  He reports this was for chronic cough but does not feel like this is helping and he topped using.  One week ago Friday he reports thrush and he used a nystatin rinse and swallow for 6 days.  He  obtained this via a virtual visit, Doctors on-call?  Unsure of the name of it.  He has compelted the course and reports his thrush has improved but his sore throat remained.  He has seen some white spots on his tonsils and continued sore throat and coughing.  He reports drainage during the day that is severe and continues through the night.  His nose is stopped up and reports tenderness.  He has taken sudafed in the past but reports this keeps him up at night so he does not like to take it.  He is taking Atrovent nasal daily and affrin PRN.  Benadryl at night to help sleep. Has also tried singular, allegra and Claritin without positive results.  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  He was most recently see at [facility] for dizziness and review of holter monitor.  Olmesartan and atenolol were discontinued and he started on amlodaipine 2.5mg  related to hypertension.  He has follow up planned in two months.  He is scheduling a neurology follow up regarding these symptoms.   He reports that the lightheadedness has improved but he reports that there is still increased pressure in his head but not as bad as previous medications.   He is seeing an neurologist for numbness and tingling left shoulder and arm.  Had EMG's has some restrictions and now planning for next  step is MRI.     Past Medical History:  Diagnosis Date  . Allergy   . Chronic cough   . Chronic sinusitis   . Diverticulitis 2018   Mild  . Diverticulosis of colon (without mention of hemorrhage)   . Early satiety   . Esophageal motility disorder 12/2018  . Family history of adverse reaction to anesthesia    mom has PONV  . GERD (gastroesophageal reflux disease)   . History of left inguinal hernia   . Hypertension   . Insomnia   . Lyme disease    QUESTIONABLE  . Pulmonary nodules 10/2018   Scattered  tiny subpleural pulmonary nodules at the peripheral right lung base, largest 3 mm  . Right inguinal hernia 2017   Small fat containing right inguinal hernia  . Sleep deficient   . Testosterone deficiency   . Vitamin D deficiency   . Wears glasses      Allergies  Allergen Reactions  . Allegra [Fexofenadine] Other (See Comments)    Worsened insomnia  . Benzoyl Peroxide     blisters  . Doxycycline Nausea Only  . Levaquin [Levofloxacin] Nausea And Vomiting    Patient states that after he took levaquin this past time he had nausea with vomiting    Current Outpatient Medications on File Prior to Visit  Medication Sig  . acetaminophen (TYLENOL) 500 MG tablet Take 500 mg by mouth every 6 (six) hours as needed for moderate pain or headache.  Marland Kitchen atenolol (TENORMIN) 50 MG tablet Take 1 tablet every Morning for BP  . Azelastine HCl 137 MCG/SPRAY SOLN Place 2 sprays into both nostrils daily.  . famotidine (PEPCID) 20 MG tablet 2 after supper (Patient taking differently: Take 40 mg by mouth at bedtime. )  . fluticasone (FLONASE) 50 MCG/ACT nasal spray Place 2 sprays into both nostrils daily.   Marland Kitchen ipratropium (ATROVENT) 0.06 % nasal spray Place 1 spray into both nostrils daily.  Marland Kitchen olmesartan (BENICAR) 40 MG tablet Take 1 tablet Daily for BP  . OVER THE COUNTER MEDICATION Take 2 capsules by mouth at bedtime. serenagen otc supplement  . RABEprazole (ACIPHEX) 20 MG tablet Take 30-60 min before first meal of the day  . tamsulosin (FLOMAX) 0.4 MG CAPS capsule Take 1 capsule at Bedtime for Prostate  . testosterone cypionate (DEPOTESTOSTERONE CYPIONATE) 200 MG/ML injection INJECT 2ML INTO THE MUSCLE EVERY 2 WEEKS  . triamcinolone cream (KENALOG) 0.1 % Apply 1 application topically 2 (two) times daily as needed for itching.  . phenylephrine (SUDAFED PE) 10 MG TABS tablet Take 10 mg by mouth every 4 (four) hours as needed.   No current facility-administered medications on file prior to visit.    ROS:  all negative except above.   Physical Exam:  BP 120/88   Pulse (!) 105   Temp 97.7 F (36.5 C)   Wt 177 lb (80.3 kg)   SpO2 97%   BMI 26.14 kg/m   General Appearance: Well nourished, in no apparent distress. Eyes: PERRLA, EOMs, conjunctiva no swelling or erythema Sinuses: Frontal/maxillary tenderness ENT/Mouth: Ext aud canals clear, TMs without erythema, bulging. No erythema, swelling,  exudate on post pharynx noted  Tonsils white exudate and erythematous. Hearing normal.  Neck: Supple, thyroid normal.  Respiratory: Respiratory effort normal, BS equal bilaterally without rales, rhonchi, wheezing or stridor.  Cardio: RRR with no MRGs. Brisk peripheral pulses without edema.  Abdomen: Soft, + BS.  Non tender, no guarding, rebound, hernias, masses. Lymphatics: Non tender without lymphadenopathy.  Musculoskeletal: Full ROM, 5/5 strength, normal gait.  Skin: Warm, dry without rashes, lesions, ecchymosis.  Neuro: Cranial nerves intact. Normal muscle tone, no cerebellar symptoms. Sensation intact.  Psych: Awake and oriented X 3, normal affect, Insight and Judgment appropriate.     Jay Sierras, NP 12:42 PM [facility]

## 2019-11-05 ENCOUNTER — Encounter: Payer: Self-pay | Admitting: Adult Health Nurse Practitioner

## 2019-11-05 ENCOUNTER — Other Ambulatory Visit: Payer: Self-pay

## 2019-11-05 ENCOUNTER — Ambulatory Visit (INDEPENDENT_AMBULATORY_CARE_PROVIDER_SITE_OTHER): Payer: 59 | Admitting: Adult Health Nurse Practitioner

## 2019-11-05 VITALS — BP 120/88 | HR 105 | Temp 97.7°F | Wt 177.0 lb

## 2019-11-05 DIAGNOSIS — J029 Acute pharyngitis, unspecified: Secondary | ICD-10-CM | POA: Diagnosis not present

## 2019-11-05 DIAGNOSIS — R7309 Other abnormal glucose: Secondary | ICD-10-CM

## 2019-11-05 DIAGNOSIS — I10 Essential (primary) hypertension: Secondary | ICD-10-CM

## 2019-11-05 DIAGNOSIS — R05 Cough: Secondary | ICD-10-CM

## 2019-11-05 DIAGNOSIS — R053 Chronic cough: Secondary | ICD-10-CM

## 2019-11-05 DIAGNOSIS — J324 Chronic pansinusitis: Secondary | ICD-10-CM | POA: Diagnosis not present

## 2019-11-05 LAB — POCT RAPID STREP A (OFFICE): Rapid Strep A Screen: NEGATIVE

## 2019-11-05 MED ORDER — CETIRIZINE HCL 10 MG PO TABS: 10.0000 mg | ORAL_TABLET | Freq: Every day | ORAL | 2 refills | Status: DC

## 2019-11-05 MED ORDER — AZITHROMYCIN 250 MG PO TABS: ORAL_TABLET | ORAL | 0 refills | Status: DC

## 2019-11-05 NOTE — Addendum Note (Signed)
Addended byGarnet Sierras A on: 11/05/2019 01:16 PM   Modules accepted: Orders

## 2019-11-07 ENCOUNTER — Other Ambulatory Visit: Payer: Self-pay | Admitting: Neurology

## 2019-11-07 DIAGNOSIS — M542 Cervicalgia: Secondary | ICD-10-CM

## 2019-11-13 ENCOUNTER — Ambulatory Visit: Admission: RE | Admit: 2019-11-13 | Payer: 59 | Source: Ambulatory Visit

## 2019-11-13 ENCOUNTER — Other Ambulatory Visit: Payer: Self-pay

## 2019-11-14 NOTE — Progress Notes (Signed)
FOLLOW UP  Assessment and Plan:   Hypertension Well controlled with current medications  Monitor blood pressure at home; patient to call if consistently greater than 130/80 Continue DASH diet.   Reminder to go to the ER if any CP, SOB, nausea, dizziness, severe HA, changes vision/speech, left arm numbness and tingling and jaw pain.  Cholesterol Currently at LDL goal by lifestyle modification;  Lifestyle for trigs reviewed;  Continue low cholesterol diet and exercise.  Check lipid panel.   Other abnormal glucose Recent A1Cs at goal Discussed diet/exercise, weight management  Defer A1C; check CMP  BMI 26 Continue to recommend diet heavy in fruits and veggies and low in animal meats, cheeses, and dairy products, appropriate calorie intake Discuss exercise recommendations routinely Continue to monitor weight at each visit  Vitamin D Def At goal at last visit; continue supplementation to maintain goal of 60-100 Defer Vit D level  Testosterone deficiency - continue replacement therapy, check testosterone levels as needed.  - shot administered by CMA today    Continue diet and meds as discussed. Further disposition pending results of labs. Discussed med's effects and SE's.   Over 30 minutes of exam, counseling, chart review, and critical decision making was performed.   Future Appointments  Date Time Provider [place] Diaz  02/19/2020  9:30 AM Unk Pinto, MD [facility] None  09/04/2020  9:00 AM Unk Pinto, MD [facility] None    ----------------------------------------------------------------------------------------------------------------------  HPI 59 y.o. male  presents for 3 month follow up on hypertension, cholesterol, glucose management, weight, hypogonadism and vitamin D deficiency.   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 [facility]ravo  test per Dr Ardis Hughs did not reveal significant acid reflux and did not feel his cough was related to GERD. He also was seen by ENT, Dr Carol Ada w/o any ENT pathology identified. Allergy Evaluation by Dr Orvil Feil was likewise unrevealing. Patient reports PFT's per Dr Melvyn Novas did not reveal Asthma and empiric treatment with MDI's did not improve his alleged cough.  Patient has been out of work per Dr Melvyn Novas & on Disability for almost a year since last Oct 2019 for Allergic Rhinitis & 'Upper Airway Cough Syndrome'.    BMI is Body mass index is 26.05 kg/m., he has not been working on diet and exercise,  Wt Readings from Last 3 Encounters:  11/18/19 176 lb 6.4 oz (80 kg)  11/05/19 177 lb (80.3 kg)  10/24/19 169 lb (76.7 kg)   His blood pressure has been controlled at home, today their BP is BP: 134/80 Off of atenolol and on amlodipine 5 mg per cardiology, feeling better with this change.    He does not workout. He denies chest pain, shortness of breath, dizziness.   He is not on cholesterol medication. His cholesterol is at goal. The cholesterol last visit was:   Lab Results  Component Value Date   CHOL 165 08/14/2019   HDL 39 (L) 08/14/2019   LDLCALC 93 08/14/2019   TRIG 238 (H) 08/14/2019   CHOLHDL 4.2 08/14/2019    He has not been working on diet and exercise for glucose management, and denies increased appetite, nausea, paresthesia of the feet, polydipsia and polyuria. Last A1C in the office was:  Lab Results  Component Value Date   HGBA1C 5.1 08/14/2019   Patient is on Vitamin D supplement but was at goal of 60-100:  Lab  Results  Component Value Date   VD25OH 60 08/14/2019     He has a history of testosterone deficiency and is on testosterone replacement, taking 400 mg every 3 weeks, last injection was 3 weeks ago. He states that the testosterone helps with his energy, libido, muscle mass. Lab Results  Component Value Date   TESTOSTERONE 642 08/14/2019    Current Medications:   Current Outpatient Medications on File Prior to Visit  Medication Sig  . acetaminophen (TYLENOL) 500 MG tablet Take 500 mg by mouth every 6 (six) hours as needed for moderate pain or headache.  Marland Kitchen amLODipine (NORVASC) 2.5 MG tablet Take 5 mg by mouth daily.   . Azelastine HCl 137 MCG/SPRAY SOLN Place 2 sprays into both nostrils daily.  . clindamycin (CLEOCIN T) 1 % lotion Apply topically as needed.  . fluticasone (FLONASE) 50 MCG/ACT nasal spray Place 2 sprays into both nostrils daily.   Marland Kitchen OVER THE COUNTER MEDICATION Take 2 capsules by mouth at bedtime. serenagen otc supplement  . RABEprazole (ACIPHEX) 20 MG tablet Take 30-60 min before first meal of the day  . tamsulosin (FLOMAX) 0.4 MG CAPS capsule Take 1 capsule at Bedtime for Prostate  . testosterone cypionate (DEPOTESTOSTERONE CYPIONATE) 200 MG/ML injection INJECT 2ML INTO THE MUSCLE EVERY 2 WEEKS  . triamcinolone cream (KENALOG) 0.1 % Apply 1 application topically 2 (two) times daily as needed for itching.   No current facility-administered medications on file prior to visit.     Allergies:  Allergies  Allergen Reactions  . Allegra [Fexofenadine] Other (See Comments)    Worsened insomnia  . Benzoyl Peroxide     blisters  . Doxycycline Nausea Only  . Levaquin [Levofloxacin] Nausea And Vomiting    Patient states that after he took levaquin this past time he had nausea with vomiting     Medical History:  Past Medical History:  Diagnosis Date  . Allergy   . Chronic cough   . Chronic sinusitis   . Diverticulitis 2018   Mild  . Diverticulosis of colon (without mention of hemorrhage)   . Early satiety   . Esophageal motility disorder 12/2018  . Family history of adverse reaction to anesthesia    mom has PONV  . GERD (gastroesophageal reflux disease)   . History of left inguinal hernia   . Hypertension   . Insomnia   . Lyme disease    QUESTIONABLE  . Pulmonary nodules 10/2018   Scattered tiny subpleural pulmonary  nodules at the peripheral right lung base, largest 3 mm  . Right inguinal hernia 2017   Small fat containing right inguinal hernia  . Sleep deficient   . Testosterone deficiency   . Vitamin D deficiency   . Wears glasses    Family history- Reviewed and unchanged Social history- Reviewed and unchanged   Review of Systems:  Review of Systems  Constitutional: Negative for malaise/fatigue and weight loss.  HENT: Negative for hearing loss and tinnitus.   Eyes: Negative for blurred vision and double vision.  Respiratory: Positive for cough (chronic). Negative for hemoptysis, sputum production, shortness of breath and wheezing.   Cardiovascular: Negative for chest pain, palpitations, orthopnea, claudication and leg swelling.  Gastrointestinal: Negative for abdominal pain, blood in stool, constipation, diarrhea, heartburn, melena, nausea and vomiting.  Genitourinary: Negative.   Musculoskeletal: Negative for joint pain and myalgias.  Skin: Negative for rash.  Neurological: Negative for dizziness, tingling, sensory change, weakness and headaches.  Endo/Heme/Allergies: Negative for polydipsia.  Psychiatric/Behavioral: Negative.  All other systems reviewed and are negative.    Physical Exam: BP 134/80   Pulse 94   Temp (!) 97.3 F (36.3 C)   Wt 176 lb 6.4 oz (80 kg)   SpO2 97%   BMI 26.05 kg/m  Wt Readings from Last 3 Encounters:  11/18/19 176 lb 6.4 oz (80 kg)  11/05/19 177 lb (80.3 kg)  10/24/19 169 lb (76.7 kg)   General Appearance: Well nourished, in no apparent distress. Eyes: PERRLA, EOMs, conjunctiva no swelling or erythema Sinuses: No Frontal/maxillary tenderness ENT/Mouth: Ext aud canals clear, TMs without erythema, bulging. No erythema, swelling, or exudate on post pharynx.  Tonsils not swollen or erythematous. Hearing normal.  Neck: Supple, thyroid normal.  Respiratory: Respiratory effort normal, BS equal bilaterally without rales, rhonchi, wheezing or stridor.   Cardio: RRR with no MRGs. Brisk peripheral pulses without edema.  Abdomen: Soft, + BS.  Non tender, no guarding, rebound, hernias, masses. Lymphatics: Non tender without lymphadenopathy.  Musculoskeletal: Full ROM, 5/5 strength, Normal gait Skin: Warm, dry without rashes, lesions, ecchymosis.  Neuro: Cranial nerves intact. No cerebellar symptoms.  Psych: Awake and oriented X 3, normal affect, Insight and Judgment appropriate.    Izora Ribas, NP 9:44 AM [facility]

## 2019-11-18 ENCOUNTER — Other Ambulatory Visit: Payer: Self-pay

## 2019-11-18 ENCOUNTER — Encounter: Payer: Self-pay | Admitting: Adult Health

## 2019-11-18 ENCOUNTER — Ambulatory Visit (INDEPENDENT_AMBULATORY_CARE_PROVIDER_SITE_OTHER): Payer: 59 | Admitting: Adult Health

## 2019-11-18 VITALS — BP 134/80 | HR 94 | Temp 97.3°F | Wt 176.4 lb

## 2019-11-18 DIAGNOSIS — I10 Essential (primary) hypertension: Secondary | ICD-10-CM

## 2019-11-18 DIAGNOSIS — Z79899 Other long term (current) drug therapy: Secondary | ICD-10-CM | POA: Diagnosis not present

## 2019-11-18 DIAGNOSIS — E291 Testicular hypofunction: Secondary | ICD-10-CM

## 2019-11-18 DIAGNOSIS — R7309 Other abnormal glucose: Secondary | ICD-10-CM

## 2019-11-18 DIAGNOSIS — E782 Mixed hyperlipidemia: Secondary | ICD-10-CM

## 2019-11-18 DIAGNOSIS — E559 Vitamin D deficiency, unspecified: Secondary | ICD-10-CM | POA: Diagnosis not present

## 2019-11-18 DIAGNOSIS — Z6826 Body mass index (BMI) 26.0-26.9, adult: Secondary | ICD-10-CM

## 2019-11-18 MED ORDER — TESTOSTERONE CYPIONATE 200 MG/ML IM SOLN
200.0000 mg | Freq: Once | INTRAMUSCULAR | Status: AC
  Administered 2019-11-18: 200 mg via INTRAMUSCULAR

## 2019-11-18 NOTE — Progress Notes (Signed)
Patient is here for his Testosterone Cypionate 200 mg/ml 2 ml IM injection in the Right upper outer quadrant. Patient tolerated well.

## 2019-11-18 NOTE — Addendum Note (Signed)
Addended by: Chancy Hurter on: 11/18/2019 10:13 AM   Modules accepted: Orders

## 2019-11-18 NOTE — Patient Instructions (Addendum)
Goals    . Exercise 150 min/wk Moderate Activity        Please call Dr. Olena Heckle to schedule overdue colonoscopy       High Triglycerides Eating Plan Triglycerides are a type of fat in the blood. High levels of triglycerides can increase your risk of heart disease and stroke. If your triglyceride levels are high, choosing the right foods can help lower your triglycerides and keep your heart healthy. Work with your health care provider or a diet and nutrition specialist (dietitian) to develop an eating plan that is right for you. What are tips for following this plan? General guidelines   Lose weight, if you are overweight. For most people, losing 5-10 lbs (2-5 kg) helps lower triglyceride levels. A weight-loss plan may include. ? 30 minutes of exercise at least 5 days a week. ? Reducing the amount of calories, sugar, and fat you eat.  Eat a wide variety of fresh fruits, vegetables, and whole grains. These foods are high in fiber.  Eat foods that contain healthy fats, such as fatty fish, nuts, seeds, and olive oil.  Avoid foods that are high in added sugar, added salt (sodium), saturated fat, and trans fat.  Avoid low-fiber, refined carbohydrates such as white bread, crackers, noodles, and white rice.  Avoid foods with partially hydrogenated oils (trans fats), such as fried foods or stick margarine.  Limit alcohol intake to no more than 1 drink a day for nonpregnant women and 2 drinks a day for men. One drink equals 12 oz of beer, 5 oz of wine, or 1 oz of hard liquor. Your health care provider may recommend that you drink less depending on your overall health. Reading food labels  Check food labels for the amount of saturated fat. Choose foods with no or very little saturated fat.  Check food labels for the amount of trans fat. Choose foods with no trans fat.  Check food labels for the amount of cholesterol. Choose foods low in cholesterol. Ask your dietitian how much  cholesterol you should have each day.  Check food labels for the amount of sodium. Choose foods with less than 140 milligrams (mg) per serving. Shopping  Buy dairy products labeled as nonfat (skim) or low-fat (1%).  Avoid buying processed or prepackaged foods. These are often high in added sugar, sodium, and fat. Cooking  Choose healthy fats when cooking, such as olive oil or canola oil.  Cook foods using lower fat methods, such as baking, broiling, boiling, or grilling.  Make your own sauces, dressings, and marinades when possible, instead of buying them. Store-bought sauces, dressings, and marinades are often high in sodium and sugar. Meal planning  Eat more home-cooked food and less restaurant, buffet, and fast food.  Eat fatty fish at least 2 times each week. Examples of fatty fish include salmon, trout, mackerel, tuna, and herring.  If you eat whole eggs, do not eat more than 3 egg yolks per week. What foods are recommended? The items listed may not be a complete list. Talk with your dietitian about what dietary choices are best for you. Grains Whole wheat or whole grain breads, crackers, cereals, and pasta. Unsweetened oatmeal. Bulgur. Barley. Quinoa. Brown rice. Whole wheat flour tortillas. Vegetables Fresh or frozen vegetables. Low-sodium canned vegetables. Fruits All fresh, canned (in natural juice), or frozen fruits. Meats and other protein foods Skinless chicken or [place]. Ground chicken or [place]. Lean cuts of pork, trimmed of fat. Fish and seafood, especially salmon, trout, and  herring. Egg whites. Dried beans, peas, or lentils. Unsalted nuts or seeds. Unsalted canned beans. Natural peanut or almond butter. Dairy Low-fat dairy products. Skim or low-fat (1%) milk. Reduced fat (2%) and low-sodium cheese. Low-fat ricotta cheese. Low-fat cottage cheese. Plain, low-fat yogurt. Fats and oils Tub margarine without trans fats. Light or reduced-fat mayonnaise. Light or  reduced-fat salad dressings. Avocado. Safflower, olive, sunflower, soybean, and canola oils. What foods are not recommended? The items listed may not be a complete list. Talk with your dietitian about what dietary choices are best for you. Grains White bread. White (regular) pasta. White rice. Cornbread. Bagels. Pastries. Crackers that contain trans fat. Vegetables Creamed or fried vegetables. Vegetables in a cheese sauce. Fruits Sweetened dried fruit. Canned fruit in syrup. Fruit juice. Meats and other protein foods Fatty cuts of meat. Ribs. Chicken wings. Berniece Salines. Sausage. Bologna. Salami. Chitterlings. Fatback. Hot dogs. Bratwurst. Packaged lunch meats. Dairy Whole or reduced-fat (2%) milk. Half-and-half. Cream cheese. Full-fat or sweetened yogurt. Full-fat cheese. Nondairy creamers. Whipped toppings. Processed cheese or cheese spreads. Cheese curds. Beverages Alcohol. Sweetened drinks, such as soda, lemonade, fruit drinks, or punches. Fats and oils Butter. Stick margarine. Lard. Shortening. Ghee. Bacon fat. Tropical oils, such as coconut, palm kernel, or palm oils. Sweets and desserts Corn syrup. Sugars. Honey. Molasses. Candy. Jam and jelly. Syrup. Sweetened cereals. Cookies. Pies. Cakes. Donuts. Muffins. Ice cream. Condiments Store-bought sauces, dressings, and marinades that are high in sugar, such as ketchup and barbecue sauce. Summary  High levels of triglycerides can increase the risk of heart disease and stroke. Choosing the right foods can help lower your triglycerides.  Eat plenty of fresh fruits, vegetables, and whole grains. Choose low-fat dairy and lean meats. Eat fatty fish at least twice a week.  Avoid processed and prepackaged foods with added sugar, sodium, saturated fat, and trans fat.  If you need suggestions or have questions about what types of food are good for you, talk with your health care provider or a dietitian. This information is not intended to replace  advice given to you by your health care provider. Make sure you discuss any questions you have with your health care provider. Document Revised: 10/06/2017 Document Reviewed: 12/27/2016 Elsevier Patient Education  2020 [employer].

## 2019-11-19 LAB — COMPLETE METABOLIC PANEL WITH GFR
AG Ratio: 1.7 (calc) (ref 1.0–2.5)
ALT: 22 U/L (ref 9–46)
AST: 27 U/L (ref 10–35)
Albumin: 4.6 g/dL (ref 3.6–5.1)
Alkaline phosphatase (APISO): 38 U/L (ref 35–144)
BUN: 18 mg/dL (ref 7–25)
CO2: 27 mmol/L (ref 20–32)
Calcium: 9.5 mg/dL (ref 8.6–10.3)
Chloride: 104 mmol/L (ref 98–110)
Creat: 0.94 mg/dL (ref 0.70–1.25)
GFR, Est African American: 102 mL/min/{1.73_m2} (ref >=60)
GFR, Est Non African American: 88 mL/min/{1.73_m2} (ref >=60)
Globulin: 2.7 g/dL (calc) (ref 1.9–3.7)
Glucose, Bld: 88 mg/dL (ref 65–99)
Potassium: 4 mmol/L (ref 3.5–5.3)
Sodium: 137 mmol/L (ref 135–146)
Total Bilirubin: 0.5 mg/dL (ref 0.2–1.2)
Total Protein: 7.3 g/dL (ref 6.1–8.1)

## 2019-11-19 LAB — LIPID PANEL
Cholesterol: 171 mg/dL (ref <200)
HDL: 38 mg/dL — ABNORMAL LOW (ref >=40)
LDL Cholesterol (Calc): 101 mg/dL (calc) — ABNORMAL HIGH
Non-HDL Cholesterol (Calc): 133 mg/dL (calc) — ABNORMAL HIGH (ref <130)
Total CHOL/HDL Ratio: 4.5 (calc) (ref <5.0)
Triglycerides: 199 mg/dL — ABNORMAL HIGH (ref <150)

## 2019-11-19 LAB — CBC WITH DIFFERENTIAL/PLATELET
Absolute Monocytes: 593 cells/uL (ref 200–950)
Basophils Absolute: 22 cells/uL (ref 0–200)
Basophils Relative: 0.5 %
Eosinophils Absolute: 69 cells/uL (ref 15–500)
Eosinophils Relative: 1.6 %
HCT: 46.3 % (ref 38.5–50.0)
Hemoglobin: 16.1 g/dL (ref 13.2–17.1)
Lymphs Abs: 937 cells/uL (ref 850–3900)
MCH: 32.8 pg (ref 27.0–33.0)
MCHC: 34.8 g/dL (ref 32.0–36.0)
MCV: 94.3 fL (ref 80.0–100.0)
MPV: 9.8 fL (ref 7.5–12.5)
Monocytes Relative: 13.8 %
Neutro Abs: 2679 cells/uL (ref 1500–7800)
Neutrophils Relative %: 62.3 %
Platelets: 159 10*3/uL (ref 140–400)
RBC: 4.91 10*6/uL (ref 4.20–5.80)
RDW: 12.5 % (ref 11.0–15.0)
Total Lymphocyte: 21.8 %
WBC: 4.3 10*3/uL (ref 3.8–10.8)

## 2019-11-19 LAB — MAGNESIUM: Magnesium: 2.1 mg/dL (ref 1.5–2.5)

## 2019-11-19 LAB — TSH: TSH: 3 mIU/L (ref 0.40–4.50)

## 2019-11-21 ENCOUNTER — Other Ambulatory Visit: Payer: Self-pay | Admitting: Internal Medicine

## 2019-12-18 ENCOUNTER — Other Ambulatory Visit: Payer: Self-pay | Admitting: Internal Medicine

## 2019-12-18 DIAGNOSIS — E349 Endocrine disorder, unspecified: Secondary | ICD-10-CM

## 2020-01-03 ENCOUNTER — Other Ambulatory Visit: Payer: Self-pay

## 2020-01-03 ENCOUNTER — Ambulatory Visit (INDEPENDENT_AMBULATORY_CARE_PROVIDER_SITE_OTHER): Payer: 59

## 2020-01-03 VITALS — BP 122/80 | HR 67 | Temp 97.9°F | Wt 176.4 lb

## 2020-01-03 DIAGNOSIS — E291 Testicular hypofunction: Secondary | ICD-10-CM

## 2020-01-03 MED ORDER — TESTOSTERONE CYPIONATE 200 MG/ML IM SOLN
200.0000 mg | INTRAMUSCULAR | Status: DC
  Administered 2020-01-03: 200 mg via INTRAMUSCULAR

## 2020-01-03 NOTE — Progress Notes (Signed)
TEST--given in Rt glute area No concerns or question Pt has already scheduled his next appt for his next injection. Vitals entered into epic as well

## 2020-01-22 ENCOUNTER — Other Ambulatory Visit: Payer: Self-pay | Admitting: Gastroenterology

## 2020-02-17 ENCOUNTER — Other Ambulatory Visit: Payer: Self-pay | Admitting: Acute Care

## 2020-02-17 DIAGNOSIS — R519 Headache, unspecified: Secondary | ICD-10-CM

## 2020-02-18 NOTE — Progress Notes (Signed)
History of Present Illness:       This very nice 59 y.o. MWM  presents for 6 month follow up with HTN, HLD, BPH/LUTS,  Prediabetes, Testosterone and Vitamin D Deficiency.  Patient is also followed for GERD.      Patient has been followed expectantly for labile  HTN since 2005 & BP has been controlled at home. Today's  . Patient has had no complaints of any cardiac type chest pain, palpitations, dyspnea / orthopnea / PND, dizziness, claudication, or dependent edema.      Hyperlipidemia is controlled with diet & meds. Patient denies myalgias or other med SE's. Last Lipids were not at goal:  Lab Results  Component Value Date   CHOL 171 11/18/2019   HDL 38 (L) 11/18/2019   LDLCALC 101 (H) 11/18/2019   TRIG 199 (H) 11/18/2019   CHOLHDL 4.5 11/18/2019    Also, the patient has history of PreDiabetes with Insulin resistance with elevated Insulin 55 with A1c 5.0% in 2015, Since then his A1c's have remained in the normal range.   and has had no symptoms of reactive hypoglycemia, diabetic polys, paresthesias or visual blurring.  Last A1c was Normal:  Lab Results  Component Value Date   HGBA1C 5.1 08/14/2019       Further, the patient also has history of Vitamin D Deficiency  ("32"/2009)  and supplements vitamin D without any suspected side-effects. Last vitamin D was at goal:  Lab Results  Component Value Date   VD25OH 60 08/14/2019    Current Outpatient Medications on File Prior to Visit  Medication Sig  . acetaminophen (TYLENOL) 500 MG tablet Take 500 mg by mouth every 6 (six) hours as needed for moderate pain or headache.  Marland Kitchen amLODipine (NORVASC) 2.5 MG tablet Take 5 mg by mouth daily.   Marland Kitchen atenolol (TENORMIN) 50 MG tablet Take 1 tablet Daily for BP  . Azelastine HCl 137 MCG/SPRAY SOLN Place 2 sprays into both nostrils daily.  . clindamycin (CLEOCIN T) 1 % lotion Apply topically as needed.  . fluticasone (FLONASE) 50 MCG/ACT nasal spray Place 2 sprays into both nostrils daily.    Marland Kitchen OVER THE COUNTER MEDICATION Take 2 capsules by mouth at bedtime. serenagen otc supplement  . RABEprazole (ACIPHEX) 20 MG tablet TAKE ONE TABLET BY MOUTH TWICE DAILY  . tamsulosin (FLOMAX) 0.4 MG CAPS capsule Take 1 capsule at Bedtime for Prostate  . testosterone cypionate (DEPOTESTOSTERONE CYPIONATE) 200 MG/ML injection Inject 2 ml into a Muscle every 2 weeks  . triamcinolone cream (KENALOG) 0.1 % Apply 1 application topically 2 (two) times daily as needed for itching.   Current Facility-Administered Medications on File Prior to Visit  Medication  . testosterone cypionate (DEPOTESTOSTERONE CYPIONATE) injection 200 mg    Allergies  Allergen Reactions  . Allegra [Fexofenadine] Other (See Comments)    Worsened insomnia  . Benzoyl Peroxide     blisters  . Doxycycline Nausea Only  . Levaquin [Levofloxacin] Nausea And Vomiting    Patient states that after he took levaquin this past time he had nausea with vomiting    PMHx:   Past Medical History:  Diagnosis Date  . Allergy   . Chronic cough   . Chronic sinusitis   . Diverticulitis 2018   Mild  . Diverticulosis of colon (without mention of hemorrhage)   . Early satiety   . Esophageal motility disorder 12/2018  . Family history of adverse reaction to anesthesia    mom has  PONV  . GERD (gastroesophageal reflux disease)   . History of left inguinal hernia   . Hypertension   . Insomnia   . Lyme disease    QUESTIONABLE  . Pulmonary nodules 10/2018   Scattered tiny subpleural pulmonary nodules at the peripheral right lung base, largest 3 mm  . Right inguinal hernia 2017   Small fat containing right inguinal hernia  . Sleep deficient   . Testosterone deficiency   . Vitamin D deficiency   . Wears glasses     Immunization History  Administered Date(s) Administered  . Influenza Inj Mdck Quad With Preservative 08/29/2017, 07/30/2018, 08/14/2019  . Influenza Split 08/07/2012, 09/25/2014  . Influenza, Seasonal, Injecte,  Preservative Fre 09/25/2015  . Influenza,inj,quad, With Preservative 09/15/2016  . PPD Test 12/01/2014, 12/08/2015, 07/11/2017, 07/30/2018, 08/14/2019  . Tdap 07/08/2010    Past Surgical History:  Procedure Laterality Date  . BRAVO [place]ham STUDY N/A 05/02/2019   Procedure: BRAVO Brooks STUDY;  Surgeon: Milus Banister, MD;  Location: [facility];  Service: Endoscopy;  Laterality: N/A;  . COLONOSCOPY    . ESOPHAGOGASTRODUODENOSCOPY (EGD) WITH PROPOFOL N/A 05/02/2019   Procedure: ESOPHAGOGASTRODUODENOSCOPY (EGD) WITH PROPOFOL;  Surgeon: Milus Banister, MD;  Location: [facility];  Service: Endoscopy;  Laterality: N/A;  . HERNIA REPAIR    . TOE SURGERY     INGROWN TOE NAIL, CHILDHOOD  . UPPER GASTROINTESTINAL ENDOSCOPY    . VASECTOMY      FHx:    Reviewed / unchanged  SHx:    Reviewed / unchanged   Systems Review:  Constitutional: Denies fever, chills, wt changes, headaches, insomnia, fatigue, night sweats, change in appetite. Eyes: Denies redness, blurred vision, diplopia, discharge, itchy, watery eyes.  ENT: Denies discharge, congestion, post nasal drip, epistaxis, sore throat, earache, hearing loss, dental pain, tinnitus, vertigo, sinus pain, snoring.  CV: Denies chest pain, palpitations, irregular heartbeat, syncope, dyspnea, diaphoresis, orthopnea, PND, claudication or edema. Respiratory: denies cough, dyspnea, DOE, pleurisy, hoarseness, laryngitis, wheezing.  Gastrointestinal: Denies dysphagia, odynophagia, heartburn, reflux, water brash, abdominal pain or cramps, nausea, vomiting, bloating, diarrhea, constipation, hematemesis, melena, hematochezia  or hemorrhoids. Genitourinary: Denies dysuria, frequency, urgency, nocturia, hesitancy, discharge, hematuria or flank pain. Musculoskeletal: Denies arthralgias, myalgias, stiffness, jt. swelling, pain, limping or strain/sprain.  Skin: Denies pruritus, rash, hives, warts, acne, eczema or change in skin lesion(s). Neuro: No weakness, tremor,  incoordination, spasms, paresthesia or pain. Psychiatric: Denies confusion, memory loss or sensory loss. Endo: Denies change in weight, skin or hair change.  Heme/Lymph: No excessive bleeding, bruising or enlarged lymph nodes.  Physical Exam  There were no vitals taken for this visit.  Appears  well nourished, well groomed  and in no distress.  Eyes: PERRLA, EOMs, conjunctiva no swelling or erythema. Sinuses: No frontal/maxillary tenderness ENT/Mouth: EAC's clear, TM's nl w/o erythema, bulging. Nares clear w/o erythema, swelling, exudates. Oropharynx clear without erythema or exudates. Oral hygiene is good. Tongue normal, non obstructing. Hearing intact.  Neck: Supple. Thyroid not palpable. Car 2+/2+ without bruits, nodes or JVD. Chest: Respirations nl with BS clear & equal w/o rales, rhonchi, wheezing or stridor.  Cor: Heart sounds normal w/ regular rate and rhythm without sig. murmurs, gallops, clicks or rubs. Peripheral pulses normal and equal  without edema.  Abdomen: Soft & bowel sounds normal. Non-tender w/o guarding, rebound, hernias, masses or organomegaly.  Lymphatics: Unremarkable.  Musculoskeletal: Full ROM all peripheral extremities, joint stability, 5/5 strength and normal gait.  Skin: Warm, dry without exposed rashes, lesions or ecchymosis apparent.  Neuro: Cranial nerves intact, reflexes equal bilaterally. Sensory-motor testing grossly intact. Tendon reflexes grossly intact.  Pysch: Alert & oriented x 3.  Insight and judgement nl & appropriate. No ideations.  Assessment and Plan:  1. Essential hypertension  - Continue medication, monitor blood pressure at home.  - Continue DASH diet.  Reminder to go to the ER if any CP,  SOB, nausea, dizziness, severe HA, changes vision/speech.  2. Hyperlipidemia, mixed  - Continue diet/meds, exercise,& lifestyle modifications.  - Continue monitor periodic cholesterol/liver & renal functions    - Lipid panel  3. Abnormal  glucose  - Continue diet, exercise  - Lifestyle modifications.  - Monitor appropriate labs.  4. Vitamin D deficiency  - Continue supplementation.  5. Medication management  - Lipid panel  6. Testosterone Deficiency  - testosterone cypionate injec 400 mg         Discussed  regular exercise, BP monitoring, weight control to achieve/maintain BMI less than 25 and discussed med and SE's. Recommended labs just done at his Neuro office (except Lipid)  to assess and monitor clinical status with further disposition pending results of labs.  I discussed the assessment and treatment plan with the patient. The patient was provided an opportunity to ask questions and all were answered. The patient agreed with the plan and demonstrated an understanding of the instructions.  I provided over 30 minutes of exam, counseling, chart review and  complex critical decision making.  Kirtland Bouchard, MD

## 2020-02-18 NOTE — Patient Instructions (Signed)

## 2020-02-19 ENCOUNTER — Ambulatory Visit (INDEPENDENT_AMBULATORY_CARE_PROVIDER_SITE_OTHER): Payer: 59 | Admitting: Internal Medicine

## 2020-02-19 ENCOUNTER — Other Ambulatory Visit: Payer: Self-pay

## 2020-02-19 ENCOUNTER — Encounter: Payer: Self-pay | Admitting: Internal Medicine

## 2020-02-19 VITALS — BP 128/82 | HR 64 | Temp 97.4°F | Resp 16 | Ht 69.0 in | Wt 170.8 lb

## 2020-02-19 DIAGNOSIS — Z79899 Other long term (current) drug therapy: Secondary | ICD-10-CM

## 2020-02-19 DIAGNOSIS — E782 Mixed hyperlipidemia: Secondary | ICD-10-CM

## 2020-02-19 DIAGNOSIS — E559 Vitamin D deficiency, unspecified: Secondary | ICD-10-CM | POA: Diagnosis not present

## 2020-02-19 DIAGNOSIS — R7309 Other abnormal glucose: Secondary | ICD-10-CM | POA: Diagnosis not present

## 2020-02-19 DIAGNOSIS — I10 Essential (primary) hypertension: Secondary | ICD-10-CM | POA: Diagnosis not present

## 2020-02-19 DIAGNOSIS — E291 Testicular hypofunction: Secondary | ICD-10-CM

## 2020-02-19 MED ORDER — TESTOSTERONE CYPIONATE 200 MG/ML IM SOLN: 400.0000 mg | Freq: Once | INTRAMUSCULAR | Status: DC

## 2020-02-20 LAB — LIPID PANEL
Cholesterol: 174 mg/dL (ref <200)
HDL: 39 mg/dL — ABNORMAL LOW (ref >=40)
LDL Cholesterol (Calc): 103 mg/dL (calc) — ABNORMAL HIGH
Non-HDL Cholesterol (Calc): 135 mg/dL (calc) — ABNORMAL HIGH (ref <130)
Total CHOL/HDL Ratio: 4.5 (calc) (ref <5.0)
Triglycerides: 203 mg/dL — ABNORMAL HIGH (ref <150)

## 2020-02-28 ENCOUNTER — Ambulatory Visit: Payer: 59

## 2020-03-16 ENCOUNTER — Other Ambulatory Visit: Payer: Self-pay | Admitting: Gastroenterology

## 2020-03-18 ENCOUNTER — Telehealth: Payer: Self-pay | Admitting: Gastroenterology

## 2020-03-18 NOTE — Telephone Encounter (Signed)
Spoke with patient and he states that pharmacy tells him that his insurance will not cover rabeprazole twice daily.  Prior authorization  was obtained for rabeprazole 20mg  one tablet twice daily [id number] I spoke with pharmacist at [facility] and insurance will not allow because it exceeds maximum dose of once daily.  Pharmacist states patient has only used [facility] to fill medication in the past and he should use [facility] going forward.  Patient made aware and will use [facility]Rx on file to have filled. Patient agreed to plan and verbalized understanding.  No further questions.

## 2020-03-18 NOTE — Telephone Encounter (Signed)
Pt called to inform that his pharmacy told her that his insurance declined prescription for aciphex 1 BID because it is above the FDA recommendations. Pt is requesting a new prescription for Aciphex one daily sent to his pharmacy.

## 2020-03-24 ENCOUNTER — Other Ambulatory Visit: Payer: Self-pay | Admitting: Internal Medicine

## 2020-03-24 DIAGNOSIS — G478 Other sleep disorders: Secondary | ICD-10-CM

## 2020-04-08 ENCOUNTER — Institutional Professional Consult (permissible substitution): Payer: 59 | Admitting: Neurology

## 2020-04-22 ENCOUNTER — Encounter: Payer: Self-pay | Admitting: Adult Health Nurse Practitioner

## 2020-04-22 ENCOUNTER — Other Ambulatory Visit: Payer: Self-pay

## 2020-04-22 ENCOUNTER — Ambulatory Visit (INDEPENDENT_AMBULATORY_CARE_PROVIDER_SITE_OTHER): Payer: 59 | Admitting: Adult Health Nurse Practitioner

## 2020-04-22 VITALS — BP 120/82 | HR 83 | Temp 97.2°F | Wt 165.0 lb

## 2020-04-22 DIAGNOSIS — R5381 Other malaise: Secondary | ICD-10-CM | POA: Diagnosis not present

## 2020-04-22 DIAGNOSIS — R5383 Other fatigue: Secondary | ICD-10-CM | POA: Diagnosis not present

## 2020-04-22 DIAGNOSIS — W57XXXA Bitten or stung by nonvenomous insect and other nonvenomous arthropods, initial encounter: Secondary | ICD-10-CM

## 2020-04-22 DIAGNOSIS — E291 Testicular hypofunction: Secondary | ICD-10-CM

## 2020-04-22 MED ORDER — TESTOSTERONE CYPIONATE 200 MG/ML IM SOLN
200.0000 mg | Freq: Once | INTRAMUSCULAR | Status: AC
  Administered 2020-04-22: 200 mg via INTRAMUSCULAR

## 2020-04-22 NOTE — Progress Notes (Signed)
Assessment and Plan:  Jay Brooks was seen today for tick removal.  Diagnoses and all orders for this visit:  Tick bite, initial encounter -   Lyme Ab (IgG/M) + RMSF( IgG/M) Area cleansed and removed foreign body  Malaise and fatigue -     Rocky mtn spotted fvr abs pnl(IgG+IgM) -     B. burgdorfi antibodies  Hypogonadism male -     testosterone cypionate (DEPOTESTOSTERONE CYPIONATE) injection 200 mg     Further disposition pending results of labs. Discussed med's effects and SE's.   Over 30 minutes of face to face interview, exam, counseling, chart review, and critical decision making was performed.   Future Appointments  Date Time Provider [place]  09/07/2020 11:00 AM Unk Pinto, MD [facility] None    ------------------------------------------------------------------------------------------------------------------   HPI 59 y.o.male presents for tick bite about two weeks ago.  Length of attachment at least three days.  Reports his left shoulder was itchying, at closer inspection he discovered it was a tick.  He had someone remove it.  Unsure if they got all of it.  He reports he is having itchy to the area, denies any pain..    Denies any fevers, headaches, joint pains.  Pfizer-SARS-COV2 vaccination on left arm.  He reports some numbness at injection site for three days  Reports this has resolved but he also has numbness ot right top of shoulder that remains present.  Past Medical History:  Diagnosis Date  . Allergy   . Chronic cough   . Chronic sinusitis   . Diverticulitis 2018   Mild  . Diverticulosis of colon (without mention of hemorrhage)   . Early satiety   . Esophageal motility disorder 12/2018  . Family history of adverse reaction to anesthesia    mom has PONV  . GERD (gastroesophageal reflux disease)   . History of left inguinal hernia   . Hypertension   . Insomnia   . Lyme disease    QUESTIONABLE  . Pulmonary nodules 10/2018   Scattered  tiny subpleural pulmonary nodules at the peripheral right lung base, largest 3 mm  . Right inguinal hernia 2017   Small fat containing right inguinal hernia  . Sleep deficient   . Testosterone deficiency   . Vitamin D deficiency   . Wears glasses      Allergies  Allergen Reactions  . Allegra [Fexofenadine] Other (See Comments)    Worsened insomnia  . Benzoyl Peroxide     blisters  . Doxycycline Nausea Only  . Levaquin [Levofloxacin] Nausea And Vomiting    Patient states that after he took levaquin this past time he had nausea with vomiting    Current Outpatient Medications on File Prior to Visit  Medication Sig  . acetaminophen (TYLENOL) 500 MG tablet Take 500 mg by mouth every 6 (six) hours as needed for moderate pain or headache.  Marland Kitchen amLODipine (NORVASC) 2.5 MG tablet Take 5 mg by mouth daily.   . Azelastine HCl 137 MCG/SPRAY SOLN Place 2 sprays into both nostrils daily.  . clindamycin (CLEOCIN T) 1 % lotion Apply topically as needed.  . famotidine (PEPCID) 20 MG tablet Take by mouth.  . fluticasone (FLONASE) 50 MCG/ACT nasal spray Place 2 sprays into both nostrils daily.   Marland Kitchen OVER THE COUNTER MEDICATION Take 2 capsules by mouth at bedtime. serenagen otc supplement  . RABEprazole (ACIPHEX) 20 MG tablet TAKE ONE TABLET BY MOUTH TWICE DAILY   . tamsulosin (FLOMAX) 0.4 MG CAPS capsule Take 1 capsule at  Bedtime for Prostate  . testosterone cypionate (DEPOTESTOSTERONE CYPIONATE) 200 MG/ML injection Inject 2 ml into a Muscle every 2 weeks  . triamcinolone cream (KENALOG) 0.1 % Apply 1 application topically 2 (two) times daily as needed for itching.   No current facility-administered medications on file prior to visit.    ROS: all negative except above.   Physical Exam:  BP 120/82   Pulse 83   Temp (!) 97.2 F (36.2 C)   Wt 165 lb (74.8 kg)   SpO2 98%   BMI 24.37 kg/m   General Appearance: Well nourished, in no apparent distress. Eyes: PERRLA, EOMs, conjunctiva no swelling  or erythema Respiratory: Respiratory effort normal, BS equal bilaterally without rales, rhonchi, wheezing or stridor.  Cardio: RRR with no MRGs. Brisk peripheral pulses without edema.  Abdomen: Soft, + BS.  Non tender, no guarding, rebound, hernias, masses. Lymphatics: Non tender without lymphadenopathy.  Musculoskeletal: Full ROM, 5/5 strength, normal gait.  Skin: Warm, dry without rashes, lesions, ecchymosis.  Mid upper back above scapula, erythema noted with yellow exudate noted. Neuro: Cranial nerves intact. Normal muscle tone, no cerebellar symptoms. Sensation intact.  Psych: Awake and oriented X 3, normal affect, Insight and Judgment appropriate.    *Area cleansed with peroxide.  Black forgine body in center of bite.  Removed with tweezers, appeared to be remains of tick.  Cleansed again and dry dressing applied.  Discussed care and monitoring. Garnet Sierras, NP 4:17 PM [facility]

## 2020-04-22 NOTE — Progress Notes (Signed)
Patient is here for his Testosterone Cypionate 200 mg/ml 2 ml IM injection in the right upper outer quadrant. Patient tolerated well.

## 2020-04-23 LAB — ROCKY MTN SPOTTED FVR ABS PNL(IGG+IGM)
RMSF IgG: NOT DETECTED
RMSF IgM: NOT DETECTED

## 2020-04-23 LAB — B. BURGDORFI ANTIBODIES: B burgdorferi Ab IgG+IgM: <0.9 index

## 2020-05-13 NOTE — Progress Notes (Signed)
C/o vague sx's of sensation of dysequilibrium .  History of Present Illness:      This Very nice 59 yo  MWM presents with sx's of "sinus pressure" in the frrontal & peri-orbital area. Has seen neurologist for opinion and with elevated BP , he was referred to Cardiologist in [facility] & was switched from Micardis to Norvasc and sx's persisted. Now off his low dose Norvasc for 2-3 weeks w/o perceivable change in sx's. Says neurologist speculated sx's might represent variant Migraine. Denies sinus congestion or drainage.   Medications  .  testosterone cypionate (DEPOTESTOSTERONE CYPIONATE) 200 MG/ML injection, Inject 2 ml into a Muscle every 2 weeks .  amLODipine (NORVASC) 2.5 MG tablet, Take 5 mg by mouth daily.  .  Azelastine HCl 137 MCG/SPRAY SOLN, Place 2 sprays into both nostrils daily. .  fluticasone (FLONASE) 50 MCG/ACT nasal spray, Place 2 sprays into both nostrils daily.  Marland Kitchen  acetaminophen (TYLENOL) 500 MG tablet, Take 500 mg by mouth every 6 (six) hours as needed for moderate pain or headache. .  clindamycin (CLEOCIN T) 1 % lotion, Apply topically as needed. .  famotidine (PEPCID) 20 MG tablet, Take by mouth. Marland Kitchen  OVER THE COUNTER MEDICATION, Take 2 capsules by mouth at bedtime. serenagen otc supplement .  RABEprazole (ACIPHEX) 20 MG tablet, TAKE ONE TABLET BY MOUTH TWICE DAILY  .  tamsulosin (FLOMAX) 0.4 MG CAPS capsule, Take 1 capsule at Bedtime for Prostate .  triamcinolone cream (KENALOG) 0.1 %, Apply 1 application topically 2 (two) times daily as needed for itching.  Problem list He has Cough; GERD (gastroesophageal reflux disease); Chronic rhinitis; Testosterone Deficiency; Hyperlipidemia, mixed; Medication management; Labile hypertension; Abnormal glucose; Vitamin D deficiency; Prostatism; FHx: heart disease; and Essential hypertension on their problem list.   Observations/Objective:  BP 138/80   Pulse 72   Temp (!) 97.3 F (36.3 C)   Resp 16   Ht 5\' 9"  (1.753 m)   Wt 169  lb 4.8 oz (76.8 kg)   BMI 25.00 kg/m   HEENT - WNL. Neck - supple.  Chest - Clear equal BS. Cor - Nl HS. RRR w/o sig MGR. PP 1(+). No edema. MS- FROM w/o deformities.  Gait Nl. Neuro -  Nl w/o focal abnormalities.  Assessment and Plan:  1. Labile hypertension  - bisoprolol  10 MG tablet; Take 1/2 to 1 tablet Daily for BP  Disp: 90 tablet; Rf: 1 - ROV 1 month to re-assess  2. Headache disorder  - emperic - bisoprolol 10 MG tablet; Take 1/2 to 1 tablet Daily for BP  Disp: 90 tab; Rf: 1  3. Testosterone deficiency       I discussed the assessment and treatment plan with the patient. The patient was provided an opportunity to ask questions and all were answered. The patient agreed with the plan and demonstrated an understanding of the instructions.       The patient was advised to call back or seek an in-person evaluation if the symptoms worsen or if the condition fails to improve as anticipated.   Kirtland Bouchard, MD

## 2020-05-14 ENCOUNTER — Ambulatory Visit (INDEPENDENT_AMBULATORY_CARE_PROVIDER_SITE_OTHER): Payer: 59 | Admitting: Internal Medicine

## 2020-05-14 ENCOUNTER — Other Ambulatory Visit: Payer: Self-pay

## 2020-05-14 ENCOUNTER — Encounter: Payer: Self-pay | Admitting: Internal Medicine

## 2020-05-14 VITALS — BP 138/80 | HR 72 | Temp 97.3°F | Resp 16 | Ht 69.0 in | Wt 169.3 lb

## 2020-05-14 DIAGNOSIS — E349 Endocrine disorder, unspecified: Secondary | ICD-10-CM | POA: Diagnosis not present

## 2020-05-14 DIAGNOSIS — R519 Headache, unspecified: Secondary | ICD-10-CM

## 2020-05-14 DIAGNOSIS — R0989 Other specified symptoms and signs involving the circulatory and respiratory systems: Secondary | ICD-10-CM

## 2020-05-14 MED ORDER — BISOPROLOL FUMARATE 10 MG PO TABS: ORAL_TABLET | ORAL | 1 refills | Status: DC

## 2020-05-14 NOTE — Progress Notes (Signed)
Patient received his Testosterone Cypionate 200 mg/ml 2 ml IM injection in his left upper outer quadrant. Tolerates well.

## 2020-05-15 ENCOUNTER — Ambulatory Visit: Payer: 59 | Admitting: Internal Medicine

## 2020-06-08 ENCOUNTER — Encounter: Payer: Self-pay | Admitting: Neurology

## 2020-06-08 ENCOUNTER — Ambulatory Visit (INDEPENDENT_AMBULATORY_CARE_PROVIDER_SITE_OTHER): Payer: 59 | Admitting: Neurology

## 2020-06-08 VITALS — BP 120/72 | HR 58 | Ht 69.0 in | Wt 172.0 lb

## 2020-06-08 DIAGNOSIS — J310 Chronic rhinitis: Secondary | ICD-10-CM

## 2020-06-08 DIAGNOSIS — F5104 Psychophysiologic insomnia: Secondary | ICD-10-CM

## 2020-06-08 DIAGNOSIS — J3489 Other specified disorders of nose and nasal sinuses: Secondary | ICD-10-CM | POA: Diagnosis not present

## 2020-06-08 MED ORDER — BELSOMRA 15 MG PO TABS: 15.0000 mg | ORAL_TABLET | Freq: Every day | ORAL | 0 refills | Status: DC

## 2020-06-08 MED ORDER — AMITRIPTYLINE HCL 25 MG PO TABS: 25.0000 mg | ORAL_TABLET | Freq: Every evening | ORAL | 3 refills | Status: DC | PRN

## 2020-06-08 MED ORDER — BELSOMRA 15 MG PO TABS: ORAL_TABLET | ORAL | 2 refills | Status: DC

## 2020-06-08 NOTE — Progress Notes (Signed)
SLEEP MEDICINE CLINIC    Provider:  Larey Seat, MD  Primary Care Physician:  Unk Pinto, MD [address] [place] [address]     Referring Provider: Unk Pinto, [address] [place],  [place] [address]          Chief Complaint according to patient   Patient presents with:    . New Patient (Initial Visit)     pt alone, rm 10. presents today for concerns of chronic sleep concerns. 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 states that he now has a regular type schedule but finds that he still avg 4-5 hrs a night, occasionally even 6 hrs. he has not tried RX meds for insomnia. He has used over the counter antihistamines to help with falling asleep and  built up tolerance. he has had 2 normal PSGs completed. last one 5 yrs ago. never has  RLS. Has seen [facility] and [facility] .       HISTORY OF PRESENT ILLNESS:  Jay Brooks is a 59 y.o. year old  Caucasian male patient seen here upon a referral on 06/08/2020 from dr. Melford Aase, his PCP.  Chief concern according to patient :  I can't fall asleep and have trouble with fatigue " . As long as a stay active and stimulated, I wont get sleepy.  I yawn a lot while driving"   I have the pleasure of seeing Jay Brooks , a right handed  Caucasian male with a possible chronic sleep disorder.  he developed a chronic insomnia, chronic cough, head pressure, lightheadedness.  He   has a past medical history of Allergy, Chronic cough, Chronic sinusitis, Diverticulitis (2018), Diverticulosis of colon (without mention of hemorrhage), Early satiety, Esophageal motility disorder (12/2018), Family history of adverse reaction to anesthesia, GERD (gastroesophageal reflux disease), History of left inguinal hernia, Hypertension, Insomnia, Lyme disease, Pulmonary nodules (10/2018), Right inguinal hernia (2017), Sleep deficient, Testosterone deficiency, Vitamin D  deficiency, and Wears glasses.   He has chronic insomnia problems, he uses Benadryl. 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 He flies all  domestic, [place], [place])  The patient had the first sleep study in the year 2005 with a negative result of "no apnea present"    Sleep relevant medical history: Nocturia once, no Tonsillectomy. Head injury in childhood.   Family medical /sleep history:mother died of pancreatic cancer.  no other family member with insomnia, maternal uncle was obese and had OSA.   Social history:  Patient is working as a Insurance underwriter and out-of work since 08-2018 and lives in a household with spouse and a college age daughter is home right now. Family status is married  With one daughter. The patient currently is out- of work.  Pets are present. One cat.  Tobacco use: none .  ETOH use : seldomly ,  Caffeine intake in form of Coffee( in AM ) Soda( lunchtime) Tea ( afternoon ) no energy drinks. Regular exercise none.   Sleep habits are as follows: The patient's dinner time is between 6 PM. The patient goes to bed at 11.30 PM and he often sleeps not before midnight. continues to sleep for 4-6 hours, wakes for one bathroom break.   The bedroom is cool, quiet and dark-  wife snores. TV is not used .  The preferred sleep position is right sided. , with the support of 1 pillow. Dreams are reportedly rare. He sometimes yells. 6-7  AM is the usual rise time. The patient wakes up spontaneously.  He  reports not feeling refreshed or restored in AM, with symptoms such as dry mouth, morning headaches and sinus pressure , and residual fatigue.  Naps are taken infrequently.   Review of Systems: Out of a complete 14 system review, the patient complains of only the following symptoms, and all other reviewed systems are negative.:  Fatigue, sleepiness , snoring, fragmented sleep,  Insomnia.   How likely are you to doze in the following situations: 0 = not likely, 1 = slight chance, 2 = moderate chance, 3 = high chance   Sitting and Reading? Watching Television? Sitting inactive in a public place (theater or meeting)? As a passenger in a car for an hour without a break? Lying down in the afternoon when circumstances permit? Sitting and talking to someone? Sitting quietly after lunch without alcohol? In a car, while stopped for a few minutes in traffic?   Total = 11/ 24 points   FSS endorsed at 38/ 63 points.   Social History   Socioeconomic History  . Marital status: Married    Spouse name: Not on file  . Number of children: 1  . Years of education: Not on file  . Highest education level: Not on file  Occupational History  . Occupation: PILOT    Employer: [employer]  Tobacco Use  . Smoking status: Never Smoker  . Smokeless tobacco: Never Used  Vaping Use  . Vaping Use: Never used  Substance and Sexual Activity  . Alcohol use: Not Currently    Comment: occ  . Drug use: No  . Sexual activity: Not on file  Other Topics Concern  . Not on file  Social History Narrative   Daily caffeine    Social Determinants of Health   Financial Resource Strain:   . Difficulty of Paying Living Expenses:   Food Insecurity:   . Worried About Charity fundraiser in the Last Year:   . Arboriculturist in the Last Year:   Transportation Needs:   . Film/video editor (Medical):   Marland Kitchen Lack of Transportation (Non-Medical):   Physical Activity:   . Days of Exercise per Week:   . Minutes of Exercise per Session:   Stress:   . Feeling of Stress :   Social Connections:   . Frequency of Communication with Friends and Family:   . Frequency of Social Gatherings with Friends and Family:   . Attends Religious Services:   . Active Member of Clubs or Organizations:   . Attends Archivist Meetings:   Marland Kitchen Marital Status:     Family History  Problem  Relation Age of Onset  . Breast cancer Maternal Grandmother   . Colon polyps Brother   . Colon polyps Maternal Uncle   . Cancer Mother 38       pancreatic  . Colon cancer Neg Hx   . Esophageal cancer Neg Hx   . Stomach cancer Neg Hx   . Ulcerative colitis Neg Hx     Past Medical History:  Diagnosis Date  . Allergy   . Chronic cough   . Chronic sinusitis   . Diverticulitis 2018   Mild  . Diverticulosis of colon (without mention of hemorrhage)   . Early  satiety   . Esophageal motility disorder 12/2018  . Family history of adverse reaction to anesthesia    mom has PONV  . GERD (gastroesophageal reflux disease)   . History of left inguinal hernia   . Hypertension   . Insomnia   . Lyme disease    QUESTIONABLE  . Pulmonary nodules 10/2018   Scattered tiny subpleural pulmonary nodules at the peripheral right lung base, largest 3 mm  . Right inguinal hernia 2017   Small fat containing right inguinal hernia  . Sleep deficient   . Testosterone deficiency   . Vitamin D deficiency   . Wears glasses     Past Surgical History:  Procedure Laterality Date  . BRAVO [place] STUDY N/A 05/02/2019   Procedure: BRAVO [place] STUDY;  Surgeon: Milus Banister, MD;  Location: [facility];  Service: Endoscopy;  Laterality: N/A;  . COLONOSCOPY    . ESOPHAGOGASTRODUODENOSCOPY (EGD) WITH PROPOFOL N/A 05/02/2019   Procedure: ESOPHAGOGASTRODUODENOSCOPY (EGD) WITH PROPOFOL;  Surgeon: Milus Banister, MD;  Location: [facility];  Service: Endoscopy;  Laterality: N/A;  . HERNIA REPAIR    . TOE SURGERY     INGROWN TOE NAIL, CHILDHOOD  . UPPER GASTROINTESTINAL ENDOSCOPY    . VASECTOMY       Current Outpatient Medications on File Prior to Visit  Medication Sig Dispense Refill  . acetaminophen (TYLENOL) 500 MG tablet Take 500 mg by mouth every 6 (six) hours as needed for moderate pain or headache.    . alfuzosin (UROXATRAL) 10 MG 24 hr tablet Take 5 mg by mouth at bedtime.    . Azelastine HCl 137  MCG/SPRAY SOLN Place 2 sprays into both nostrils daily.    . bisoprolol (ZEBETA) 10 MG tablet Take 1/2 to 1 tablet Daily for BP 90 tablet 1  . clindamycin (CLEOCIN T) 1 % lotion Apply topically as needed.    . famotidine (PEPCID) 20 MG tablet Take by mouth.    . fluticasone (FLONASE) 50 MCG/ACT nasal spray Place 2 sprays into both nostrils daily.     Marland Kitchen OVER THE COUNTER MEDICATION Take 2 capsules by mouth at bedtime. serenagen otc supplement    . RABEprazole (ACIPHEX) 20 MG tablet TAKE ONE TABLET BY MOUTH TWICE DAILY  60 tablet 3  . testosterone cypionate (DEPOTESTOSTERONE CYPIONATE) 200 MG/ML injection Inject 2 ml into a Muscle every 2 weeks 10 mL 2  . triamcinolone cream (KENALOG) 0.1 % Apply 1 application topically 2 (two) times daily as needed for itching.     No current facility-administered medications on file prior to visit.    Allergies  Allergen Reactions  . Allegra [Fexofenadine] Other (See Comments)    Worsened insomnia  . Benzoyl Peroxide     blisters  . Doxycycline Nausea Only  . Levaquin [Levofloxacin] Nausea And Vomiting    Patient states that after he took levaquin this past time he had nausea with vomiting    Physical exam:  Today's Vitals   06/08/20 1450  BP: 120/72  Pulse: (!) 58  Weight: 172 lb (78 kg)  Height: 5\' 9"  (1.753 m)   Body mass index is 25.4 kg/m.   Wt Readings from Last 3 Encounters:  06/08/20 172 lb (78 kg)  05/14/20 169 lb 4.8 oz (76.8 kg)  04/22/20 165 lb (74.8 kg)     Ht Readings from Last 3 Encounters:  06/08/20 5\' 9"  (1.753 m)  05/14/20 5\' 9"  (1.753 m)  02/19/20 5\' 9"  (1.753 m)  General: The patient is awake, alert and appears not in acute distress. The patient is well groomed. Head: Normocephalic, atraumatic. Neck is supple. Mallampati 2- very exaggerated gag reflex. ,  neck circumference: 17 inches . Nasal airflow patent.  Retrognathia is  seen.   Cardiovascular:  Regular rate and cardiac rhythm by pulse,  without  distended neck veins. Respiratory: Lungs are clear to auscultation.  Skin:  Without evidence of ankle edema, or rash. Trunk: The patient's posture is erect.   Neurologic exam : The patient is awake and alert, oriented to place and time.   Memory subjective described as intact.  Attention span & concentration ability appears normal.  Speech is fluent,  without  dysarthria, dysphonia or aphasia.  Mood and affect are appropriate.   Cranial nerves: no loss of smell or taste reported  Pupils are equal and briskly reactive to light. Funduscopic exam deferred.  Extraocular movements in vertical and horizontal planes were intact and without nystagmus. No Diplopia. senistivity to sinus pressure points around the orbit. Visual fields by finger perimetry are intact. Hearing was intact to soft voice and finger rubbing.  Facial sensation intact to fine touch.  Facial motor strength is symmetric and tongue and uvula move midline.  Neck ROM : rotation, tilt and flexion extension were normal for age and shoulder shrug was symmetrical.    Motor exam:  Symmetric bulk, tone and ROM.   Normal tone without cog wheeling, symmetric grip strength .   Sensory:  Fine touch, pinprick and vibration were tested  and  normal.  Proprioception tested in the upper extremities was normal. Coordination: Rapid alternating movements in the fingers/hands were of normal speed.  The Finger-to-nose maneuver was intact without evidence of ataxia, dysmetria or tremor. Gait and station: Patient could rise unassisted from a seated position, walked without assistive device.  Stance is of normal width/ base and the patient turned with 3 steps.  Toe and heel walk were deferred.  Deep tendon reflexes: in the  upper and lower extremities are symmetric and intact.  Babinski response was deferred.       After spending a total time of 55  minutes face to face and additional time for physical and neurologic examination, review of  laboratory studies,  personal review of imaging studies, reports and results of other testing and review of referral information / records as far as provided in visit, I have established the following assessments:  Chronic insomnia , previous evaluations have not found an organic cause.  I would recommend a cognitive behaviour therapy if our repeat sleep study gives similar results.  Sinus CT was normal. Dr Benjamine Mola.   1) patient is under FAA restriction for sleep aids, SSRI and Tricyclica.  2) Vertigo started with BP medication adjustment, BP currently well controlled, but still some     lightheadedness.  3) Mucinex in AM- clear sinus, use a sinus rinse. 4) GERD - continue Pepcid at bedtime ,     My Plan is to proceed with:  1) Restricting the sleep time. " boot camp " hand out.  2) Attended sleep study. 3) Belsomra po for sleep initiation. Failed benadryl, melatonin, alprazolam, temazepam.  amitriptyline low dose at night - helping with sinus pressure , hopefully.   I would like to thank Dr Benjamine Mola, and Unk Pinto, [address] [place],  [place] [address] for allowing me to meet with and to take care of this pleasant patient.   In short, Jay Brooks  is presenting with fatigue and non- organic sleep insomnia.    Electronically signed by: Larey Seat, MD 06/08/2020 2:57 PM  [facility] Board certified by The [employer] of Sleep Medicine and Diplomate of the [employer] of Sleep Medicine. Board certified In Neurology through the [place], Fellow of the [employer] of Neurology. Medical Director of [facility].

## 2020-06-08 NOTE — Addendum Note (Signed)
Addended by: Darleen Crocker on: 06/08/2020 04:06 PM   Modules accepted: Orders

## 2020-06-08 NOTE — Patient Instructions (Signed)
Please remember to try to maintain good sleep hygiene, which means: Keep a regular sleep and wake schedule, try not to exercise or have a meal within 2 hours of your bedtime, try to keep your bedroom conducive for sleep, that is, cool and dark, without light distractors such as an illuminated alarm clock, and refrain from watching TV right before sleep or in the middle of the night and do not keep the TV or radio on during the night. Also, try not to use or play on electronic devices at bedtime, such as your cell phone, tablet PC or laptop. If you like to read at bedtime on an electronic device, try to dim the background light as much as possible. Do not eat in the middle of the night.   We will request a sleep study.    We will look for leg twitching and snoring or sleep apnea.   For chronic insomnia, you are best followed by a psychiatrist and/or sleep psychologist.   We will call you with the sleep study results and make a follow up appointment if needed.     Insomnia Insomnia is a sleep disorder that makes it difficult to fall asleep or stay asleep. Insomnia can cause fatigue, low energy, difficulty concentrating, mood swings, and poor performance at work or school. There are three different ways to classify insomnia:  Difficulty falling asleep.  Difficulty staying asleep.  Waking up too early in the morning. Any type of insomnia can be long-term (chronic) or short-term (acute). Both are common. Short-term insomnia usually lasts for three months or less. Chronic insomnia occurs at least three times a week for longer than three months. What are the causes? Insomnia may be caused by another condition, situation, or substance, such as:  Anxiety.  Certain medicines.  Gastroesophageal reflux disease (GERD) or other gastrointestinal conditions.  Asthma or other breathing conditions.  Restless legs syndrome, sleep apnea, or other sleep disorders.  Chronic  pain.  Menopause.  Stroke.  Abuse of alcohol, tobacco, or illegal drugs.  Mental health conditions, such as depression.  Caffeine.  Neurological disorders, such as Alzheimer's disease.  An overactive thyroid (hyperthyroidism). Sometimes, the cause of insomnia may not be known. What increases the risk? Risk factors for insomnia include:  Gender. Women are affected more often than men.  Age. Insomnia is more common as you get older.  Stress.  Lack of exercise.  Irregular work schedule or working night shifts.  Traveling between different time zones.  Certain medical and mental health conditions. What are the signs or symptoms? If you have insomnia, the main symptom is having trouble falling asleep or having trouble staying asleep. This may lead to other symptoms, such as:  Feeling fatigued or having low energy.  Feeling nervous about going to sleep.  Not feeling rested in the morning.  Having trouble concentrating.  Feeling irritable, anxious, or depressed. How is this diagnosed? This condition may be diagnosed based on:  Your symptoms and medical history. Your health care provider may ask about: ? Your sleep habits. ? Any medical conditions you have. ? Your mental health.  A physical exam. How is this treated? Treatment for insomnia depends on the cause. Treatment may focus on treating an underlying condition that is causing insomnia. Treatment may also include:  Medicines to help you sleep.  Counseling or therapy.  Lifestyle adjustments to help you sleep better. Follow these instructions at home: Eating and drinking   Limit or avoid alcohol, caffeinated beverages, and cigarettes,   especially close to bedtime. These can disrupt your sleep.  Do not eat a large meal or eat spicy foods right before bedtime. This can lead to digestive discomfort that can make it hard for you to sleep. Sleep habits   Keep a sleep diary to help you and your health care  provider figure out what could be causing your insomnia. Write down: ? When you sleep. ? When you wake up during the night. ? How well you sleep. ? How rested you feel the next day. ? Any side effects of medicines you are taking. ? What you eat and drink.  Make your bedroom a dark, comfortable place where it is easy to fall asleep. ? Put up shades or blackout curtains to block light from outside. ? Use a white noise machine to block noise. ? Keep the temperature cool.  Limit screen use before bedtime. This includes: ? Watching TV. ? Using your smartphone, tablet, or computer.  Stick to a routine that includes going to bed and waking up at the same times every day and night. This can help you fall asleep faster. Consider making a quiet activity, such as reading, part of your nighttime routine.  Try to avoid taking naps during the day so that you sleep better at night.  Get out of bed if you are still awake after 15 minutes of trying to sleep. Keep the lights down, but try reading or doing a quiet activity. When you feel sleepy, go back to bed. General instructions  Take over-the-counter and prescription medicines only as told by your health care provider.  Exercise regularly, as told by your health care provider. Avoid exercise starting several hours before bedtime.  Use relaxation techniques to manage stress. Ask your health care provider to suggest some techniques that may work well for you. These may include: ? Breathing exercises. ? Routines to release muscle tension. ? Visualizing peaceful scenes.  Make sure that you drive carefully. Avoid driving if you feel very sleepy.  Keep all follow-up visits as told by your health care provider. This is important. Contact a health care provider if:  You are tired throughout the day.  You have trouble in your daily routine due to sleepiness.  You continue to have sleep problems, or your sleep problems get worse. Get help right  away if:  You have serious thoughts about hurting yourself or someone else. If you ever feel like you may hurt yourself or others, or have thoughts about taking your own life, get help right away. You can go to your nearest emergency department or call:  Your local emergency services (911 in the [place]  A suicide crisis helpline, such as the National Suicide Prevention Lifeline at 1-800-273-8255. This is open 24 hours a day. Summary  Insomnia is a sleep disorder that makes it difficult to fall asleep or stay asleep.  Insomnia can be long-term (chronic) or short-term (acute).  Treatment for insomnia depends on the cause. Treatment may focus on treating an underlying condition that is causing insomnia.  Keep a sleep diary to help you and your health care provider figure out what could be causing your insomnia. This information is not intended to replace advice given to you by your health care provider. Make sure you discuss any questions you have with your health care provider. Document Revised: 10/06/2017 Document Reviewed: 08/03/2017 Elsevier Patient Education  2020 [employer]  

## 2020-06-09 ENCOUNTER — Telehealth: Payer: Self-pay

## 2020-06-09 DIAGNOSIS — F5104 Psychophysiologic insomnia: Secondary | ICD-10-CM

## 2020-06-09 DIAGNOSIS — J310 Chronic rhinitis: Secondary | ICD-10-CM

## 2020-06-09 DIAGNOSIS — J3489 Other specified disorders of nose and nasal sinuses: Secondary | ICD-10-CM

## 2020-06-09 NOTE — Telephone Encounter (Signed)
HST has been ordered

## 2020-06-09 NOTE — Telephone Encounter (Signed)
[employer] will not cover in lab sleep study due to no co-morbidities. Need HST order.

## 2020-06-12 ENCOUNTER — Encounter: Payer: Self-pay | Admitting: Neurology

## 2020-06-16 ENCOUNTER — Telehealth: Payer: Self-pay | Admitting: Neurology

## 2020-06-16 NOTE — Telephone Encounter (Signed)
PA approved for the patient through [employer] until 06/16/2021

## 2020-06-16 NOTE — Telephone Encounter (Signed)
Completed PA for [employer] on cover my meds/[employer] HDQ:[id number] Will hear a response in 72 hrs

## 2020-06-17 ENCOUNTER — Telehealth: Payer: Self-pay

## 2020-06-17 NOTE — Telephone Encounter (Signed)
LVM for pt to call me back to schedule sleep study  

## 2020-06-18 ENCOUNTER — Encounter: Payer: Self-pay | Admitting: Internal Medicine

## 2020-06-18 ENCOUNTER — Ambulatory Visit (INDEPENDENT_AMBULATORY_CARE_PROVIDER_SITE_OTHER): Payer: 59 | Admitting: Internal Medicine

## 2020-06-18 ENCOUNTER — Other Ambulatory Visit: Payer: Self-pay

## 2020-06-18 VITALS — BP 112/78 | HR 60 | Temp 96.7°F | Resp 16 | Ht 69.0 in | Wt 172.2 lb

## 2020-06-18 DIAGNOSIS — R0989 Other specified symptoms and signs involving the circulatory and respiratory systems: Secondary | ICD-10-CM | POA: Diagnosis not present

## 2020-06-18 DIAGNOSIS — E291 Testicular hypofunction: Secondary | ICD-10-CM | POA: Diagnosis not present

## 2020-06-18 DIAGNOSIS — M7711 Lateral epicondylitis, right elbow: Secondary | ICD-10-CM

## 2020-06-18 MED ORDER — DEXAMETHASONE 4 MG PO TABS: ORAL_TABLET | ORAL | 0 refills | Status: DC

## 2020-06-18 MED ORDER — TESTOSTERONE CYPIONATE 200 MG/ML IM SOLN
400.0000 mg | Freq: Once | INTRAMUSCULAR | Status: AC
  Administered 2020-06-18: 400 mg via INTRAMUSCULAR

## 2020-06-18 NOTE — Progress Notes (Addendum)
   History of Present Illness:      Patient is a very nice 59 yo MWM with labile HTN returns for 1 month f/u after  switched from Norvasc to Bisoprolol 10 mg x 1/2 tab  (5 mg) and random BP monitoring has been Normal . Generally,  reports he has felt better.       Today, he is c/o pains about his Rt elbow.   Medications  Current Outpatient Medications (Endocrine & Metabolic):  .  testosterone cypionate (DEPOTESTOSTERONE CYPIONATE) 200 MG/ML injection, Inject 2 ml into a Muscle every 2 weeks  Current Outpatient Medications (Cardiovascular):  .  bisoprolol (ZEBETA) 10 MG tablet, Take 1/2 to 1 tablet Daily for BP  Current Outpatient Medications (Respiratory):  Marland Kitchen  Azelastine HCl 137 MCG/SPRAY SOLN, Place 2 sprays into both nostrils daily. .  fluticasone (FLONASE) 50 MCG/ACT nasal spray, Place 2 sprays into both nostrils daily.   Current Outpatient Medications (Analgesics):  .  acetaminophen (TYLENOL) 500 MG tablet, Take 500 mg by mouth every 6 (six) hours as needed for moderate pain or headache.   Current Outpatient Medications (Other):  .  alfuzosin (UROXATRAL) 10 MG 24 hr tablet, Take 5 mg by mouth at bedtime. Marland Kitchen  amitriptyline (ELAVIL) 25 MG tablet, Take 1 tablet (25 mg total) by mouth at bedtime as needed for sleep. .  clindamycin (CLEOCIN T) 1 % lotion, Apply topically as needed. .  famotidine (PEPCID) 20 MG tablet, Take by mouth. Marland Kitchen  OVER THE COUNTER MEDICATION, Take 2 capsules by mouth at bedtime. serenagen otc supplement .  RABEprazole (ACIPHEX) 20 MG tablet, TAKE ONE TABLET BY MOUTH TWICE DAILY  .  Suvorexant (BELSOMRA) 15 MG TABS, Take at 1 hour before intended bedtime, avoid screen and LED light. .  Suvorexant (BELSOMRA) 15 MG TABS, Take 15 mg by mouth at bedtime. .  triamcinolone cream (KENALOG) 0.1 %, Apply 1 application topically 2 (two) times daily as needed for itching.  Problem list He has Cough; GERD (gastroesophageal reflux disease); Chronic rhinitis; Testosterone  Deficiency; Hyperlipidemia, mixed; Medication management; Labile hypertension; Abnormal glucose; Vitamin D deficiency; Prostatism; FHx: heart disease; Essential hypertension; Chronic insomnia; and Sinus pressure on their problem list.   Observations/Objective:   There were no vitals taken for this visit.  HEENT - WNL. Neck - supple.  Chest - Clear equal BS. Cor - Nl HS. RRR w/o sig MGR. PP 1(+). No edema. MS- Tender at Rt elbow Lateral epicondyle.  Neuro -  Nl w/o focal abnormalities.  Assessment and Plan:  1. Labile hypertension  - Continue meds same and random BP monitoring and     recommended regular exercise  2. Epicondylitis, lateral, right  - dexamethasone  4 MG ; Take 1 tab 3 x day - 3 days, then 2 x day - 3 days, then 1 tab daily  Disp: 20 tab;   3. Hypogonadism male  - testosterone cypionate  injection 400 mg  Follow Up Instructions:      I discussed the assessment and treatment plan with the patient. The patient was provided an opportunity to ask questions and all were answered. The patient agreed with the plan and demonstrated an understanding of the instructions.       The patient was advised to call back or seek an in-person evaluation if the symptoms worsen or if the condition fails to improve as anticipated.   Kirtland Bouchard, MD

## 2020-06-18 NOTE — Progress Notes (Signed)
Testosterone Cypionate 200 mg/ml IM in right upper outer quadrant. Patient tolerated well.

## 2020-07-15 ENCOUNTER — Ambulatory Visit (INDEPENDENT_AMBULATORY_CARE_PROVIDER_SITE_OTHER): Payer: 59 | Admitting: Neurology

## 2020-07-15 DIAGNOSIS — J310 Chronic rhinitis: Secondary | ICD-10-CM

## 2020-07-15 DIAGNOSIS — G4733 Obstructive sleep apnea (adult) (pediatric): Secondary | ICD-10-CM | POA: Diagnosis not present

## 2020-07-15 DIAGNOSIS — R0683 Snoring: Secondary | ICD-10-CM

## 2020-07-15 DIAGNOSIS — F5104 Psychophysiologic insomnia: Secondary | ICD-10-CM

## 2020-07-15 DIAGNOSIS — J3489 Other specified disorders of nose and nasal sinuses: Secondary | ICD-10-CM

## 2020-07-15 DIAGNOSIS — R059 Cough, unspecified: Secondary | ICD-10-CM

## 2020-07-20 NOTE — Procedures (Signed)
  Patient Information     First Name: Jay Last Name: Brooks ID: [id number]  Birth Date: 2061-07-03 Age: 59 Gender: Male  Referring Provider: Unk Pinto, MD BMI: 25.5 (W=172 lb, H=5' 9'')  Neck Circ.:  17 '' Epworth:  11/24   Sleep Study Information    Study Date: 07/16/20 S/H/A Version: [id number] / [id number] / 79  History:    Jay Brooks is a 59 y.o. year old Caucasian male patient seen here upon a referral on 06/08/2020 from Dr. Melford Aase, his PCP. Jay Brooks, is a right-handed Caucasian male who developed a chronic insomnia, chronic cough, head pressure, lightheadedness, daytime sleepiness..  He has a medical history of Allergy, Chronic cough, Chronic sinusitis, (2018), Diverticulosis of colon (without mention of hemorrhage), Esophageal motility disorder (12/2018), GERD (gastroesophageal reflux disease), History of left inguinal hernia, Hypertension, Insomnia, Lyme disease, Pulmonary nodules (10/2018), Right inguinal hernia (2017), Sleep deficient, Testosterone deficiency, Vitamin D deficiency.    Summary & Diagnosis:    Only limited data were available form this HST-No differentiation in REM and NREM sleep could be made. The overall AHI was 19.8/h and indicative of moderate sleep apnea.  There was loud snoring recorded but no hypoxemia noted.   Recommendations:      A presumed obstructive sleep apnea without hypoxia can be treated with CPAP, Inspire device or dental device. CPAP is usually the first line treatment. The patient may start on an auto-PAP device, with pressures of 5-15 cm water and with mask of his choice and heated humidification.     Interpreting Physician: Larey Seat, MD            Sleep Summary  Oxygen Saturation Statistics   Start Study Time: End Study Time: Total Recording Time:  1:02:56 AM 8:47:20 AM   7 h, 44 min  Total Sleep Time % REM of Sleep Time:  5 h, 8 min  6.3    Mean: 94 Minimum: 90 Maximum: 98  Mean of Desaturations Nadirs (%):    92  Oxygen Desaturation. %:  4-9 10-20 >20 Total  Events Number Total   29  1 96.7 3.3  0 0.0  30 100.0  Oxygen Saturation: <90 <=88 <85 <80 <70  Duration (minutes): Sleep % 0.0 0.0 0.0 0.0 0.0 0.0 0.0 0.0 0.0 0.0     Respiratory Indices      Total Events REM NREM All Night  pRDI: pAHI 3%: ODI 4%: pAHIc 3%: % CSR: pAHI 4%: 137   99  30  14 0.0 42 N/A N/A N/A N/A N/A N/A N/A N/A 27.3 19.8 6.0 2.8        Pulse Rate Statistics during Sleep (BPM)      Mean: 53 Minimum: 46 Maximum: 82

## 2020-07-20 NOTE — Progress Notes (Signed)
Patient with chronic sinus issues may not tolerate CPAP _ however these are the HST results:  Summary & Diagnosis:   Only limited data were available form this HST-No differentiation  in REM and NREM sleep could be made. The overall AHI was 19.8/h  and indicative of moderate sleep apnea. There was loud snoring  recorded but no hypoxemia noted.   Recommendations:     A presumed obstructive sleep apnea without hypoxia can be treated  with CPAP, Inspire device or dental device.  CPAP is usually the first line treatment. The patient may start  on an auto-PAP device, with pressures of 5-15 cm water and with  mask of his choice and heated humidification.

## 2020-07-20 NOTE — Addendum Note (Signed)
Addended by: Larey Seat on: 07/20/2020 05:48 PM   Modules accepted: Orders

## 2020-07-21 ENCOUNTER — Encounter: Payer: Self-pay | Admitting: Neurology

## 2020-07-21 ENCOUNTER — Telehealth: Payer: Self-pay | Admitting: Neurology

## 2020-07-21 NOTE — Telephone Encounter (Signed)
I called pt. I advised pt that Dr. Brett Fairy reviewed their sleep study results and found that pt moderate sleep apnea. Dr. Brett Fairy recommends that pt starts auto CPAP. I reviewed PAP compliance expectations with the pt. Pt is agreeable to starting a CPAP. I advised pt that an order will be sent to a DME, [facility] ([facility] and [facility] ([place]) will call the pt within about one week after they file with the pt's insurance. [facility]) will show the pt how to use the machine, fit for masks, and troubleshoot the CPAP if needed. A follow up appt was made for insurance purposes with Dr. Brett Fairy on Dec 16,2021 at 10:30 am. Pt verbalized understanding to arrive 15 minutes early and bring their CPAP. A letter with all of this information in it will be mailed to the pt as a reminder. I verified with the pt that the address we have on file is correct. Pt verbalized understanding of results. Pt had no questions at this time but was encouraged to call back if questions arise. I have sent the order to [facility]) and have received confirmation that they have received the order.  After reviewing all the information the patient wanted to discuss the sleep for that night. Patient states he had a difficult time with falling asleep with the equipment on. States that he went to sleep between 1-2, and woke up. Then went to couch in living room and may had dozed back off. He states at best he may had gotten 3 hours of sleep. He is curious to know how we noted 5 hours of sleep. Advised that I would question this. Patient is ok with moving forward with apnea treatment but he wanted to verify the data because he did not sleep well that night. Informed I would have Dr Dohmeier review and will contact through mychart with her response.

## 2020-07-21 NOTE — Telephone Encounter (Signed)
Called patient to discuss sleep study results. No answer at this time. LVM for the patient to call back.   

## 2020-07-21 NOTE — Telephone Encounter (Signed)
Pt has returned the call to [facility] please call.

## 2020-07-21 NOTE — Telephone Encounter (Signed)
-----   Message from Larey Seat, MD sent at 07/20/2020  5:48 PM EDT ----- Patient with chronic sinus issues may not tolerate CPAP _ however these are the HST results:  Summary & Diagnosis:   Only limited data were available form this HST-No differentiation  in REM and NREM sleep could be made. The overall AHI was 19.8/h  and indicative of moderate sleep apnea. There was loud snoring  recorded but no hypoxemia noted.   Recommendations:     A presumed obstructive sleep apnea without hypoxia can be treated  with CPAP, Inspire device or dental device.  CPAP is usually the first line treatment. The patient may start  on an auto-PAP device, with pressures of 5-15 cm water and with  mask of his choice and heated humidification.

## 2020-08-03 ENCOUNTER — Other Ambulatory Visit: Payer: Self-pay | Admitting: Internal Medicine

## 2020-08-12 ENCOUNTER — Other Ambulatory Visit: Payer: Self-pay | Admitting: Internal Medicine

## 2020-08-12 DIAGNOSIS — E349 Endocrine disorder, unspecified: Secondary | ICD-10-CM

## 2020-09-04 ENCOUNTER — Encounter: Payer: 59 | Admitting: Internal Medicine

## 2020-09-06 ENCOUNTER — Encounter: Payer: Self-pay | Admitting: Internal Medicine

## 2020-09-06 DIAGNOSIS — E349 Endocrine disorder, unspecified: Secondary | ICD-10-CM | POA: Insufficient documentation

## 2020-09-06 NOTE — Progress Notes (Signed)
Annual  Screening/Preventative Visit  & Comprehensive Evaluation & Examination     This very nice 59 y.o.  MWM presents for a Screening /Preventative Visit & comprehensive evaluation and management of multiple medical co-morbidities.  Patient has been followed for labile HTN, HLD, Prediabetes, Testosterone Dreeficiency and Vitamin D Deficiency.     Patient was recently dx'd Moderate OSA by Dr Brett Fairy and recommended Auto CPAP. Patient had also seen a Neurologist in [place] who speculated that his "sinus sx's"  might represent variant Migraine. 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 Bravo test per Dr Ardis Hughs did not reveal significant acid reflux and did not feel his cough was related to GERD. He also was seen by ENT, Dr Carol Ada w/o any ENT pathology identified. Allergy Evaluation by Dr Orvil Feil was likewise unrevealing. Patient reports PFT's per Dr Melvyn Novas did not reveal Asthma and empiric treatment with MDI's did not improve his cough.  Patient has been out of work per Dr Melvyn Novas & on Disability for almost a year since last Oct 2019 for Allergic Rhinitis & 'Upper Airway Cough Syndrome'..]]      Labile HTN predates since  2005. Patient's BP has been labile at home- occasionally elevated. Today's BP is at goal -  136/86. Patient denies any cardiac symptoms as chest pain, palpitations, shortness of breath, dizziness or ankle swelling.      Patient's hyperlipidemia is not controlled with diet. Last lipids were not at goal:  Lab Results  Component Value Date   CHOL 174 02/19/2020   HDL 39 (L) 02/19/2020   LDLCALC 103 (H) 02/19/2020   TRIG 203 (H) 02/19/2020   CHOLHDL 4.5 02/19/2020                  Patient has TestosteroneDeficiency("150" / 2010) and  is on replacement by injection with improved  stamina & sense of well being.      Patient has hx/o prediabetes  /Insulin Resistance (A1c 5.0% /elevated Insulin"55" /2014)  and patient denies reactive hypoglycemic symptoms, visual blurring, diabetic polys or paresthesias. Last A1c was Normal & at goal:  Lab Results  Component Value Date   HGBA1C 5.1 08/14/2019        Finally, patient has history of Vitamin D Deficiency ("32" /2009) and last vitamin D was at goal:  Lab Results  Component Value Date   VD25OH 60 08/14/2019    Current Outpatient Medications on File Prior to Visit  Medication Sig  . acetaminophen  500 MG tablet Take  every 6 hours as needed for moderate pain  . UROXATRAL 10 MG 24 hr tablet Take 5 mg by mouth at bedtime.  Marland Kitchen amitriptyline  25 MG tablet Take 1 tablet  at bedtime as needed for sleep.  . Azelastine SPRAY SOLN Place 2 sprays into both nostrils daily.  . bisoprolol 10 MG tablet Take 1/2 to 1 tablet Daily for BP  . CLEOCIN T 1 % lotion Apply topically as needed.  . famotidine  20 MG tablet Take daily  . FLONASE nasal spray Place 2 sprays into both nostrils daily.   Marland Kitchen  serenagen otc supplement Take 2 capsules by mouth at bedtime.  . ACIPHEX 20 MG tablet TAKE ONE TABLET TWICE DAILY   . BELSOMRA 15 MG TABS Take at 1 hour  before intended bedtime  . tamsulosin 0.4 MG CAPS capsule TAKE 1 CAPAT BEDTIME   . testosterone cypio 200 MG/ML injection INJECT 2 ML IM EVERY 2 WEEKS  . triamcinolone crm 0.1 % Apply topically 2 times daily as needed     Allergies  Allergen Reactions  . Allegra [Fexofenadine] Other (See Comments)    Worsened insomnia  . Benzoyl Peroxide     blisters  . Doxycycline Nausea Only  . Levaquin [Levofloxacin] Nausea And Vomiting   Past Medical History:  Diagnosis Date  . Allergy   . Chronic cough   . Chronic sinusitis   . Diverticulitis 2018   Mild  . Diverticulosis of colon (without mention of hemorrhage)   . Early satiety   . Esophageal motility disorder 12/2018  . Family  history of adverse reaction to anesthesia    mom has PONV  . GERD (gastroesophageal reflux disease)   . History of left inguinal hernia   . Hypertension   . Insomnia   . Lyme disease    QUESTIONABLE  . Pulmonary nodules 10/2018   Scattered tiny subpleural pulmonary nodules at the peripheral right lung base, largest 3 mm  . Right inguinal hernia 2017   Small fat containing right inguinal hernia  . Sleep deficient   . Testosterone deficiency   . Vitamin D deficiency   . Wears glasses    Health Maintenance  Topic Date Due  . Hepatitis C Screening  Never done  . HIV Screening  Never done  . COLONOSCOPY  Never done  . INFLUENZA VACCINE  06/07/2020  . TETANUS/TDAP  07/08/2020  . COVID-19 Vaccine  Completed   Immunization History  Administered Date(s) Administered  . Influenza Inj Mdck Quad With Preservative 08/29/2017, 07/30/2018, 08/14/2019  . Influenza Split 08/07/2012, 09/25/2014  . Influenza, Seasonal, Injecte, Preservative Fre 09/25/2015  . Influenza,inj,quad, With Preservative 09/15/2016  . PFIZER SARS-COV-2 Vaccination 04/17/2020, 05/08/2020  . PPD Test 12/01/2014, 12/08/2015, 07/11/2017, 07/30/2018, 08/14/2019  . Tdap 07/08/2010   Last Colon -   12/21/2010 - Dr Ardis Hughs - Recc 10 yr f/u - due Feb 2022  Past Surgical History:  Procedure Laterality Date  . BRAVO [place] STUDY N/A 05/02/2019   Procedure: BRAVO [place] STUDY;  Surgeon: Milus Banister, MD;  Location: [facility];  Service: Endoscopy;  Laterality: N/A;  . COLONOSCOPY    . ESOPHAGOGASTRODUODENOSCOPY (EGD) WITH PROPOFOL N/A 05/02/2019   Procedure: ESOPHAGOGASTRODUODENOSCOPY (EGD) WITH PROPOFOL;  Surgeon: Milus Banister, MD;  Location: [facility];  Service: Endoscopy;  Laterality: N/A;  . HERNIA REPAIR    . TOE SURGERY     INGROWN TOE NAIL, CHILDHOOD  . UPPER GASTROINTESTINAL ENDOSCOPY    . VASECTOMY     Family History  Problem Relation Age of Onset  . Breast cancer Maternal Grandmother   . Colon polyps  Brother   . Colon polyps Maternal Uncle   . Cancer Mother 21       pancreatic  . Colon cancer Neg Hx   . Esophageal cancer Neg Hx   . Stomach cancer Neg Hx   . Ulcerative colitis Neg Hx    Social History   Socioeconomic History  . Marital status: Married    Spouse name: Maudie Mercury  . Number of children: 1 daughter  Occupational History  . Occupation: Airline pilot: [employer]  Tobacco Use  . Smoking status: Never Smoker  . Smokeless tobacco: Never Used  Vaping Use  . Vaping Use: Never used  Substance and Sexual Activity  . Alcohol use: Not Currently    Comment: occ  . Drug use: No  . Sexual activity: Not on file     ROS Constitutional: Denies fever, chills, weight loss/gain, headaches, insomnia,  night sweats or change in appetite. Does c/o fatigue. Eyes: Denies redness, blurred vision, diplopia, discharge, itchy or watery eyes.  ENT: Denies discharge, congestion, post nasal drip, epistaxis, sore throat, earache, hearing loss, dental pain, Tinnitus, Vertigo, Sinus pain or snoring.  Cardio: Denies chest pain, palpitations, irregular heartbeat, syncope, dyspnea, diaphoresis, orthopnea, PND, claudication or edema Respiratory: denies cough, dyspnea, DOE, pleurisy, hoarseness, laryngitis or wheezing.  Gastrointestinal: Denies dysphagia, heartburn, reflux, water brash, pain, cramps, nausea, vomiting, bloating, diarrhea, constipation, hematemesis, melena, hematochezia, jaundice or hemorrhoids Genitourinary: Denies dysuria, frequency, urgency, nocturia, hesitancy, discharge, hematuria or flank pain Musculoskeletal: Denies arthralgia, myalgia, stiffness, Jt. Swelling, pain, limp or strain/sprain. Denies Falls. Skin: Denies puritis, rash, hives, warts, acne, eczema or change in skin lesion Neuro: No weakness, tremor, incoordination, spasms, paresthesia or pain Psychiatric: Denies confusion, memory loss or sensory loss. Denies Depression. Endocrine: Denies change in weight,  skin, hair change, nocturia, and paresthesia, diabetic polys, visual blurring or hyper / hypo glycemic episodes.  Heme/Lymph: No excessive bleeding, bruising or enlarged lymph nodes.  Physical Exam  BP 136/86   Pulse 61   Temp (!) 97.4 F (36.3 C)   Resp 16   Ht 5\' 9"  (1.753 m)   Wt 168 lb 12.8 oz (76.6 kg)   SpO2 98%   BMI 24.93 kg/m   General Appearance: Well nourished and well groomed and in no apparent distress.  Eyes: PERRLA, EOMs, conjunctiva no swelling or erythema, normal fundi and vessels. Sinuses: No frontal/maxillary tenderness ENT/Mouth: EACs patent / TMs  nl. Nares clear without erythema, swelling, mucoid exudates. Oral hygiene is good. No erythema, swelling, or exudate. Tongue normal, non-obstructing. Tonsils not swollen or erythematous. Hearing normal.  Neck: Supple, thyroid not palpable. No bruits, nodes or JVD. Respiratory: Respiratory effort normal.  BS equal and clear bilateral without rales, rhonci, wheezing or stridor. Cardio: Heart sounds are normal with regular rate and rhythm and no murmurs, rubs or gallops. Peripheral pulses are normal and equal bilaterally without edema. No aortic or femoral bruits. Chest: symmetric with normal excursions and percussion.  Abdomen: Soft, with Nl bowel sounds. Nontender, no guarding, rebound, hernias, masses, or organomegaly.  Lymphatics: Non tender without lymphadenopathy.  Musculoskeletal: Full ROM all peripheral extremities, joint stability, 5/5 strength, and normal gait. Skin: Warm and dry without rashes, lesions, cyanosis, clubbing or  ecchymosis.  Neuro: Cranial nerves intact, reflexes equal bilaterally. Normal muscle tone, no cerebellar symptoms. Sensation intact.  Pysch: Alert and oriented X 3 with normal affect, insight and judgment appropriate.   Assessment and Plan  1. Annual Preventative/Screening Exam    2. Essential hypertension  - Add Olmesartan 20 mg -sig: 1/2 to 1 tab qpm for BP - EKG 12-Lead  - [place],  RETROPERITNL ABD,  LTD - Urinalysis, Routine w reflex microscopic - Microalbumin / creatinine urine ratio - CBC with Differential/Platelet - COMPLETE METABOLIC PANEL WITH GFR - Magnesium - TSH  3. Hyperlipidemia, mixed  - EKG 12-Lead - [place], RETROPERITNL ABD,  LTD - Lipid panel - TSH  4. Abnormal glucose  - EKG 12-Lead - [place], RETROPERITNL ABD,  LTD - Hemoglobin A1c - Insulin, random  5. Vitamin D deficiency  - VITAMIN D 25 Hydroxy    6. Testosterone deficiency  - Testosterone  7. OSA  on CPAP   8. Screening for colorectal cancer  - POC Hemoccult Bld/Stl   9. Screening-pulmonary TB  - TB Skin Test  10. Prostate cancer screening  - PSA  11. Screening for ischemic heart disease  - EKG 12-Lead  12. FHx: heart disease  - EKG 12-Lead - [place], RETROPERITNL ABD,  LTD  13. Screening for AAA (aortic abdominal aneurysm)  - [place], RETROPERITNL ABD,  LTD  14. Fatigue  - Iron,Total/Total Iron Binding Cap - Vitamin B12 - CBC with Differential/Platelet - TSH  15. Medication management  - Urinalysis, Routine w reflex microscopic - Microalbumin / creatinine urine ratio - Testosterone - CBC with Differential/Platelet - COMPLETE METABOLIC PANEL WITH GFR - Magnesium - Lipid panel - TSH - Hemoglobin A1c - Insulin, random - VITAMIN D 25 Hydroxyl           Patient was counseled in prudent diet, weight control to achieve/maintain BMI less than 25, BP monitoring, regular exercise and medications as discussed.  Discussed med effects and SE's. Routine screening labs and tests as requested with regular follow-up as recommended. Over 40 minutes of exam, counseling, chart review and high complex critical decision making was performed   Kirtland Bouchard, MD

## 2020-09-06 NOTE — Patient Instructions (Signed)

## 2020-09-07 ENCOUNTER — Ambulatory Visit (INDEPENDENT_AMBULATORY_CARE_PROVIDER_SITE_OTHER): Payer: 59 | Admitting: Internal Medicine

## 2020-09-07 ENCOUNTER — Other Ambulatory Visit: Payer: Self-pay

## 2020-09-07 VITALS — BP 136/86 | HR 61 | Temp 97.4°F | Resp 16 | Ht 69.0 in | Wt 168.8 lb

## 2020-09-07 DIAGNOSIS — N138 Other obstructive and reflux uropathy: Secondary | ICD-10-CM

## 2020-09-07 DIAGNOSIS — Z111 Encounter for screening for respiratory tuberculosis: Secondary | ICD-10-CM

## 2020-09-07 DIAGNOSIS — R5383 Other fatigue: Secondary | ICD-10-CM

## 2020-09-07 DIAGNOSIS — Z23 Encounter for immunization: Secondary | ICD-10-CM | POA: Diagnosis not present

## 2020-09-07 DIAGNOSIS — Z79899 Other long term (current) drug therapy: Secondary | ICD-10-CM

## 2020-09-07 DIAGNOSIS — I10 Essential (primary) hypertension: Secondary | ICD-10-CM

## 2020-09-07 DIAGNOSIS — E559 Vitamin D deficiency, unspecified: Secondary | ICD-10-CM

## 2020-09-07 DIAGNOSIS — Z0001 Encounter for general adult medical examination with abnormal findings: Secondary | ICD-10-CM

## 2020-09-07 DIAGNOSIS — Z136 Encounter for screening for cardiovascular disorders: Secondary | ICD-10-CM

## 2020-09-07 DIAGNOSIS — E782 Mixed hyperlipidemia: Secondary | ICD-10-CM

## 2020-09-07 DIAGNOSIS — R7309 Other abnormal glucose: Secondary | ICD-10-CM

## 2020-09-07 DIAGNOSIS — Z0000 Encounter for general adult medical examination without abnormal findings: Secondary | ICD-10-CM | POA: Diagnosis not present

## 2020-09-07 DIAGNOSIS — Z8249 Family history of ischemic heart disease and other diseases of the circulatory system: Secondary | ICD-10-CM | POA: Diagnosis not present

## 2020-09-07 DIAGNOSIS — Z125 Encounter for screening for malignant neoplasm of prostate: Secondary | ICD-10-CM

## 2020-09-07 DIAGNOSIS — G4733 Obstructive sleep apnea (adult) (pediatric): Secondary | ICD-10-CM

## 2020-09-07 DIAGNOSIS — N401 Enlarged prostate with lower urinary tract symptoms: Secondary | ICD-10-CM

## 2020-09-07 DIAGNOSIS — E349 Endocrine disorder, unspecified: Secondary | ICD-10-CM

## 2020-09-07 DIAGNOSIS — Z1211 Encounter for screening for malignant neoplasm of colon: Secondary | ICD-10-CM

## 2020-09-07 MED ORDER — TESTOSTERONE CYPIONATE 200 MG/ML IM SOLN
400.0000 mg | Freq: Once | INTRAMUSCULAR | Status: AC
  Administered 2020-09-07: 400 mg via INTRAMUSCULAR

## 2020-09-07 MED ORDER — OLMESARTAN MEDOXOMIL 20 MG PO TABS: ORAL_TABLET | ORAL | 0 refills | Status: DC

## 2020-09-07 NOTE — Progress Notes (Signed)
Testosterone Cypionate 200 mg/ml 2 ml IM left upper outer quadrant. Tolerated well.

## 2020-09-08 LAB — COMPLETE METABOLIC PANEL WITH GFR
AG Ratio: 1.9 (calc) (ref 1.0–2.5)
ALT: 13 U/L (ref 9–46)
AST: 19 U/L (ref 10–35)
Albumin: 4.4 g/dL (ref 3.6–5.1)
Alkaline phosphatase (APISO): 54 U/L (ref 35–144)
BUN: 18 mg/dL (ref 7–25)
CO2: 29 mmol/L (ref 20–32)
Calcium: 9.6 mg/dL (ref 8.6–10.3)
Chloride: 104 mmol/L (ref 98–110)
Creat: 0.99 mg/dL (ref 0.70–1.25)
GFR, Est African American: 95 mL/min/{1.73_m2} (ref >=60)
GFR, Est Non African American: 82 mL/min/{1.73_m2} (ref >=60)
Globulin: 2.3 g/dL (calc) (ref 1.9–3.7)
Glucose, Bld: 92 mg/dL (ref 65–99)
Potassium: 4.2 mmol/L (ref 3.5–5.3)
Sodium: 140 mmol/L (ref 135–146)
Total Bilirubin: 0.4 mg/dL (ref 0.2–1.2)
Total Protein: 6.7 g/dL (ref 6.1–8.1)

## 2020-09-08 LAB — CBC WITH DIFFERENTIAL/PLATELET
Absolute Monocytes: 537 cells/uL (ref 200–950)
Basophils Absolute: 22 cells/uL (ref 0–200)
Basophils Relative: 0.5 %
Eosinophils Absolute: 62 cells/uL (ref 15–500)
Eosinophils Relative: 1.4 %
HCT: 43.5 % (ref 38.5–50.0)
Hemoglobin: 14.7 g/dL (ref 13.2–17.1)
Lymphs Abs: 1219 cells/uL (ref 850–3900)
MCH: 31.9 pg (ref 27.0–33.0)
MCHC: 33.8 g/dL (ref 32.0–36.0)
MCV: 94.4 fL (ref 80.0–100.0)
MPV: 9.3 fL (ref 7.5–12.5)
Monocytes Relative: 12.2 %
Neutro Abs: 2561 cells/uL (ref 1500–7800)
Neutrophils Relative %: 58.2 %
Platelets: 179 10*3/uL (ref 140–400)
RBC: 4.61 10*6/uL (ref 4.20–5.80)
RDW: 13.3 % (ref 11.0–15.0)
Total Lymphocyte: 27.7 %
WBC: 4.4 10*3/uL (ref 3.8–10.8)

## 2020-09-08 LAB — HEMOGLOBIN A1C
Hgb A1c MFr Bld: 5.3 % of total Hgb (ref <5.7)
Mean Plasma Glucose: 105 (calc)
eAG (mmol/L): 5.8 (calc)

## 2020-09-08 LAB — LIPID PANEL
Cholesterol: 189 mg/dL (ref <200)
HDL: 40 mg/dL (ref >=40)
LDL Cholesterol (Calc): 112 mg/dL (calc) — ABNORMAL HIGH
Non-HDL Cholesterol (Calc): 149 mg/dL (calc) — ABNORMAL HIGH (ref <130)
Total CHOL/HDL Ratio: 4.7 (calc) (ref <5.0)
Triglycerides: 259 mg/dL — ABNORMAL HIGH (ref <150)

## 2020-09-08 LAB — URINALYSIS, ROUTINE W REFLEX MICROSCOPIC
Bilirubin Urine: NEGATIVE
Glucose, UA: NEGATIVE
Hgb urine dipstick: NEGATIVE
Ketones, ur: NEGATIVE
Leukocytes,Ua: NEGATIVE
Nitrite: NEGATIVE
Protein, ur: NEGATIVE
Specific Gravity, Urine: 1.007 (ref 1.001–1.03)
pH: 6 (ref 5.0–8.0)

## 2020-09-08 LAB — MICROALBUMIN / CREATININE URINE RATIO
Creatinine, Urine: 49 mg/dL (ref 20–320)
Microalb Creat Ratio: 4 mcg/mg creat (ref <30)
Microalb, Ur: 0.2 mg/dL

## 2020-09-08 LAB — TESTOSTERONE: Testosterone: 201 ng/dL — ABNORMAL LOW (ref 250–827)

## 2020-09-08 LAB — IRON, TOTAL/TOTAL IRON BINDING CAP
%SAT: 33 % (calc) (ref 20–48)
Iron: 89 ug/dL (ref 50–180)
TIBC: 271 mcg/dL (calc) (ref 250–425)

## 2020-09-08 LAB — VITAMIN D 25 HYDROXY (VIT D DEFICIENCY, FRACTURES): Vit D, 25-Hydroxy: 84 ng/mL (ref 30–100)

## 2020-09-08 LAB — INSULIN, RANDOM: Insulin: 19.1 u[IU]/mL

## 2020-09-08 LAB — TSH: TSH: 3.56 mIU/L (ref 0.40–4.50)

## 2020-09-08 LAB — PSA: PSA: 2.65 ng/mL (ref <=4.0)

## 2020-09-08 LAB — MAGNESIUM: Magnesium: 2.1 mg/dL (ref 1.5–2.5)

## 2020-09-08 LAB — VITAMIN B12: Vitamin B-12: 1065 pg/mL (ref 200–1100)

## 2020-09-08 NOTE — Progress Notes (Signed)
========================================================== -   Test results slightly outside the reference range are not unusual. If there is anything important, I will review this with you,  otherwise it is considered normal test values.  If you have further questions,  please do not hesitate to contact me at the office or via My Chart.  ==========================================================  -  Iron & Vitamin B12 levels  - both Normal & OK   ==========================================================  -  PSA - Low - Great  ==========================================================  -  Testosterone low - taken just before your shot   - Recommend take Zinc 50 mg tablet Daily to help raise Testosterone levels naturally   - You can use up your current bottle and then leave off for a while and see   How you feel and what the zinc does  for the level. ==========================================================  -  Total Chol = 189 is "OK" , but   - the bad LDL Chol = 112 - is higher  (Ideal or Goal is less than 70  !  )  - So. . . . . . . . . .  - Recommend  a stricter low cholesterol diet   - Cholesterol only comes from animal sources  - ie. meat, dairy, egg yolks  - Eat all the vegetables you want.  - Avoid meat, especially red meat - Beef AND Pork .  - Avoid cheese & dairy - milk & ice cream.     - Cheese is the most concentrated form of trans-fats which  is the worst thing to clog up our arteries.   - Veggie cheese is OK which can be found in the fresh  produce section at [employer] or [employer] or [employer] ==========================================================  -  Also Triglycerides (   259   ) or fats in blood are too high  (goal is less than 150)    - Recommend avoid fried & greasy foods,  sweets / candy,   - Avoid white rice  (brown or wild rice or Quinoa is OK),   - Avoid white potatoes  (sweet potatoes are OK)   - Avoid anything made from  white flour  - bagels, doughnuts, rolls, buns, biscuits, white and   wheat breads, pizza crust and traditional  pasta made of white flour & egg white  - (vegetarian pasta or spinach or wheat pasta is OK).    - Multi-grain bread is OK - like multi-grain flat bread or  sandwich thins.   - Avoid alcohol in excess.   - Exercise is also important. ==========================================================  -  A1c - Normal - Great - No Diabetes  ! ==========================================================  -  Vitamin D = 84 - Excellent  ==========================================================  -  All Else - CBC - Kidneys - U/A - Electrolytes - Liver - Magnesium & Thyroid    - all  Normal / OK ==========================================================

## 2020-09-15 ENCOUNTER — Encounter: Payer: Self-pay | Admitting: Neurology

## 2020-09-16 LAB — TB SKIN TEST
Induration: 0 mm
TB Skin Test: NEGATIVE

## 2020-10-12 ENCOUNTER — Other Ambulatory Visit: Payer: Self-pay

## 2020-10-12 DIAGNOSIS — Z1211 Encounter for screening for malignant neoplasm of colon: Secondary | ICD-10-CM

## 2020-10-12 DIAGNOSIS — Z1212 Encounter for screening for malignant neoplasm of rectum: Secondary | ICD-10-CM

## 2020-10-12 LAB — POC HEMOCCULT BLD/STL (HOME/3-CARD/SCREEN)
Card #2 Fecal Occult Blod, POC: NEGATIVE
Card #3 Fecal Occult Blood, POC: NEGATIVE
Fecal Occult Blood, POC: NEGATIVE

## 2020-10-13 DIAGNOSIS — Z1211 Encounter for screening for malignant neoplasm of colon: Secondary | ICD-10-CM

## 2020-10-13 DIAGNOSIS — Z1212 Encounter for screening for malignant neoplasm of rectum: Secondary | ICD-10-CM

## 2020-10-22 ENCOUNTER — Ambulatory Visit: Payer: Self-pay | Admitting: Adult Health

## 2020-11-24 ENCOUNTER — Other Ambulatory Visit: Payer: Self-pay

## 2020-11-24 MED ORDER — RABEPRAZOLE SODIUM 20 MG PO TBEC
20.0000 mg | DELAYED_RELEASE_TABLET | Freq: Two times a day (BID) | ORAL | 6 refills | Status: DC
Start: 2020-11-24 — End: 2021-09-10

## 2020-11-30 ENCOUNTER — Telehealth: Payer: Self-pay | Admitting: Neurology

## 2020-11-30 NOTE — Telephone Encounter (Signed)
Called the patient back he has still not been set up on his CPAP machine.  With the patient still strongly feels that the home sleep test that was completed back in September was not accurate.  We have had this conversation before.  And upon reviewing the data it seemed that the data was accurate.  But the patient is insistent that from 2 AM to 5 AM he was NOT asleep.  He states that he had gotten up went to the chair was watching TV and was unable to fall asleep.  Advised the patient that from our standpoint it looks like he entered into light sleep multiple times in which case could be a sleep perception disorder.  This was explained through my chart messaging and telephone conversation with back on 14 September.  At this point he is unable to fly because he cannot get set up with treatment.  The patient is insistent that he has had 2 in lab studies before that were negative for sleep apnea.  He does not feel that this study is accurate and disagrees completely that he had 5 hours of sleep. States he would really like to assess repeating the study. He also wanted me to note that he did not have his sleep medicine that night.  He would like to consider repeating the study and taking his sleep medication.  A total of 25 minutes was spent on the phone with the patient informing him of our previous conversation and that this home sleep study appeared to be accurate according to Dr. Brett Fairy and our technician.  Informed the patient what the process looks like with a dental device referral however he is concerned with the price of the dental device.  Patient is also not able to start the [facility] device because it does not have the heated tubing which he thinks he most likely would need.  Explained to the patient that although it does not, we proceeded to then will be ordered and added if that is necessary.  Patient really feels that he does not have sleep apnea and would really like to readdress this.  Informed the  patient I would pass this information along to our sleep lab manager and Dr. Brett Fairy so that they can further reassess his studies once more.  Informed that the sleep lab manager would be in contact with him to better explain. Patient was appreciative.

## 2020-11-30 NOTE — Telephone Encounter (Signed)
Pt still waiting on CPAP, pt would like to discuss a dental devise.

## 2020-12-02 ENCOUNTER — Ambulatory Visit (INDEPENDENT_AMBULATORY_CARE_PROVIDER_SITE_OTHER): Payer: Self-pay | Admitting: Neurology

## 2020-12-02 DIAGNOSIS — G4733 Obstructive sleep apnea (adult) (pediatric): Secondary | ICD-10-CM

## 2020-12-02 DIAGNOSIS — Z0289 Encounter for other administrative examinations: Secondary | ICD-10-CM

## 2020-12-03 NOTE — Progress Notes (Signed)
   [facility] [place] ([facility])  STUDY DATE: 12/02/20  DOB: 07-Apr-1961  MRN: [id number]  ORDERING CLINICIAN: Larey Seat, MD   REFERRING CLINICIAN: Unk Pinto, MD   CLINICAL INFORMATION/HISTORY: Jay Brooks ,a right handed  Caucasian male with a chronic sleep disorder, presented on 11-30-2020 for a repeat sleep study. He had developed a chronic insomnia, chronic cough, head pressure, lightheadedness. He is a Insurance underwriter.  Medical history of:  Chronic cough, Chronic sinusitis, Diverticulitis (2018), Diverticulosis of colon (without mention of hemorrhage), Early satiety, Esophageal motility disorder (12/2018), Family history of adverse reaction to anesthesia, GERD (gastroesophageal reflux disease), History of left inguinal hernia, Hypertension, Insomnia, Lyme disease, Pulmonary nodules (10/2018), Right inguinal hernia (2017), Sleep deficient, Testosterone deficiency, Vitamin D deficiency.   He has chronic insomnia problems, he uses Benadryl. 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 He flies all domestic routes, [place], [place])  Epworth sleepiness score: 11/24. BMI: 25.5 kg/m Neck Circumference: 17 "  FINDINGS:  Total Record Time (hours, min): 8 h 11 min Total Sleep Time (hours, min):  6 h 29 min  Percent REM (%):    14.89 %  Calculated pAHI (per hour):  31.3       REM pAHI: 34.5     NREM pAHI: 30.8 Supine AHI: 44.0   Oxygen Saturation (%) Mean: 94  Minimum oxygen saturation (%):        84   O2 Saturation Range (%): 84-98  O2Saturation (minutes) <=88%: 0.9 min   Pulse Mean (bpm):    64  Pulse Range (45-110)   IMPRESSION: This HST documented more severe OSA (obstructive sleep apnea) than the previous study. No central apneas were recorded. No prolonged hypoxia or abnormal heart rate was noted. Close to 15 % REM sleep was  calculated.     RECOMMENDATION:  I still recommend CPAP as most appropriate therapy, followed by inspire or dental device as second best therapies.     INTERPRETING PHYSICIAN:  Larey Seat, MD  [facility] Sleep Board certified by The [employer] of Sleep Medicine and Diplomate of the [employer] of Sleep Medicine. Board certified In Neurology through the [place], Fellow of the [employer] of Neurology. Medical Director of [employer].

## 2020-12-09 ENCOUNTER — Encounter: Payer: Self-pay | Admitting: Neurology

## 2020-12-17 ENCOUNTER — Encounter: Payer: Self-pay | Admitting: Adult Health

## 2020-12-17 ENCOUNTER — Ambulatory Visit (INDEPENDENT_AMBULATORY_CARE_PROVIDER_SITE_OTHER): Payer: 59 | Admitting: Adult Health

## 2020-12-17 ENCOUNTER — Other Ambulatory Visit: Payer: Self-pay

## 2020-12-17 VITALS — BP 128/80 | HR 72 | Temp 96.6°F | Ht 69.0 in | Wt 176.0 lb

## 2020-12-17 DIAGNOSIS — I10 Essential (primary) hypertension: Secondary | ICD-10-CM

## 2020-12-17 DIAGNOSIS — E291 Testicular hypofunction: Secondary | ICD-10-CM

## 2020-12-17 DIAGNOSIS — Z79899 Other long term (current) drug therapy: Secondary | ICD-10-CM

## 2020-12-17 DIAGNOSIS — F5104 Psychophysiologic insomnia: Secondary | ICD-10-CM

## 2020-12-17 DIAGNOSIS — E782 Mixed hyperlipidemia: Secondary | ICD-10-CM

## 2020-12-17 DIAGNOSIS — E559 Vitamin D deficiency, unspecified: Secondary | ICD-10-CM

## 2020-12-17 DIAGNOSIS — R7309 Other abnormal glucose: Secondary | ICD-10-CM

## 2020-12-17 MED ORDER — TESTOSTERONE CYPIONATE 200 MG/ML IM SOLN
200.0000 mg | Freq: Once | INTRAMUSCULAR | Status: AC
  Administered 2020-12-17: 200 mg via INTRAMUSCULAR

## 2020-12-17 NOTE — Progress Notes (Signed)
Testosterone Cypionate 200 mg/ml 2 ml IM injection in the RIGHT upper outer quadrant. Patient tolerated well.

## 2020-12-17 NOTE — Progress Notes (Signed)
FOLLOW UP  Assessment and Plan:   Hypertension Currently well controlled off of medication Monitor blood pressure at home; patient to call if consistently greater than 130/80 Continue DASH diet.   Reminder to go to the ER if any CP, SOB, nausea, dizziness, severe HA, changes vision/speech, left arm numbness and tingling and jaw pain.  Cholesterol Currently at LDL goal by lifestyle modification;  Lifestyle for trigs reviewed;  Continue low cholesterol diet and exercise.  Check lipid panel.   Other abnormal glucose Recent A1Cs at goal Discussed diet/exercise, weight management  Defer A1C; check CMP  BMI 25 Lifestyle discussed at length; he is consuming minimal vegetables Recommended diet heavy in fruits and veggies and low in animal meats, cheeses, and dairy products, appropriate calorie intake; reduce processed meats/foods Increase physical activity Follow up at next visit  Vitamin D Def At goal at last visit; continue supplementation to maintain goal of 60-100 Defer Vit D level  Testosterone deficiency - continue replacement therapy, check testosterone levels as needed.  - shot administered by CMA today   Insomnia - preferring to avoid meds at this time - suggested short term melatonin 5-15 mg 1 hour prior to planned bedtime, get up at same time daily, avoid napping - good sleep hygiene discussed, increase day time activity  Daily headache Pending sleep study Dr. Brett Fairy ? Elements of sinus HA - saline irrigations, antihistamine, nasal steroid  or tension headache - stress management; identification of triggers; try heat application, neck exercises, massage; has had vison checked -  Lifestyle discussed; keep headache diary  Reasonable to continue to hold meds in the interim and monitor benefit with resolution of dizziness, HA; suggested hold ~2 weeks, then may try slowly reintroducing to evaluate for benefit/SE with each addition -    Continue diet and meds as  discussed. Further disposition pending results of labs. Discussed med's effects and SE's.   Over 30 minutes of exam, counseling, chart review, and critical decision making was performed.   Future Appointments  Date Time Provider [place]  03/23/2021 10:30 AM Unk Pinto, MD [facility] None  09/14/2021  3:00 PM Unk Pinto, MD [facility] None    ----------------------------------------------------------------------------------------------------------------------  HPI 60 y.o. male  presents for 3 month follow up on hypertension, cholesterol, glucose management, weight, hypogonadism and vitamin D deficiency.   He reports he recently tapered down and stopped taking most of his medications; as of 4 days ago he is only taking vitamin D and his stool softener, due to his suspicion some of his medications may be causing some persistent cough and dizziness. He does perceive some improvement since stopping medications, though not resolved.    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 Bravo test per Dr Ardis Hughs did not reveal significant acid reflux and did not feel his cough was related to GERD. He also was seen by ENT, Dr Carol Ada w/o any ENT pathology identified. Allergy Evaluation by Dr Orvil Feil was likewise unrevealing. Patient reports PFT's per Dr Melvyn Novas did not reveal Asthma and empiric treatment with MDI's did not improve his cough. Patient has been out of work per Dr Melvyn Novas &on Disability... since last Oct 2019 for Allergic Rhinitis &'Upper Airway Cough Syndrome'..]]  Patient was recently diagnosed with Moderate OSA by Dr Brett Fairy and recommended Auto CPAP, however, patient felt this sleep study from 07/2020 to be  inaccurate, reports recently underwent repeat study and pending results. Reports was prescribed amitriptyline and belsomra for  sleep by Dr. Brett Fairy, feels these disrupted sleep and stopped.   Patient had also seen a Neurologist in [place] who speculated that his "sinus sx's"  might represent variant Migraine. Denies sinus congestion or drainage    BMI is Body mass index is 25.99 kg/m., he has been working on diet and exercise, has been cutting down on fried/fatty foods, has started walking, 20-30 min 2-3 days a week weather permitting. Does "wheat" bread sandwich; he is eating some fruits,  Wt Readings from Last 3 Encounters:  12/17/20 176 lb (79.8 kg)  09/07/20 168 lb 12.8 oz (76.6 kg)  06/18/20 172 lb 3.2 oz (78.1 kg)   His blood pressure has been controlled at home, today their BP is BP: 128/80  He has been prescribed   He does not workout. He denies chest pain, shortness of breath, dizziness.   He is not on cholesterol medication. His cholesterol is not at goal. He has been  The cholesterol last visit was:   Lab Results  Component Value Date   CHOL 189 09/07/2020   HDL 40 09/07/2020   LDLCALC 112 (H) 09/07/2020   TRIG 259 (H) 09/07/2020   CHOLHDL 4.7 09/07/2020    He has not been working on diet and exercise for glucose management, and denies increased appetite, nausea, paresthesia of the feet, polydipsia and polyuria. Last A1C in the office was:  Lab Results  Component Value Date   HGBA1C 5.3 09/07/2020   Patient is on Vitamin D supplement but was at goal of 60-100:  Lab Results  Component Value Date   VD25OH 84 09/07/2020     He has a history of testosterone deficiency and is on testosterone replacement, taking 400 mg every 3 weeks, last injection was 3 weeks ago. He states that the testosterone helps with his energy, libido, muscle mass. Lab Results  Component Value Date   TESTOSTERONE 201 (L) 09/07/2020     Current Medications:  Current Outpatient Medications on File Prior to Visit  Medication Sig  . acetaminophen (TYLENOL) 500 MG tablet Take 500 mg by mouth every 6 (six) hours as  needed for moderate pain or headache.  Marland Kitchen amitriptyline (ELAVIL) 25 MG tablet Take 1 tablet (25 mg total) by mouth at bedtime as needed for sleep.  . Azelastine HCl 137 MCG/SPRAY SOLN Place 2 sprays into both nostrils daily.  . bisoprolol (ZEBETA) 10 MG tablet Take 1/2 to 1 tablet Daily for BP  . butalbital-acetaminophen-caffeine (FIORICET) 50-325-40 MG tablet Take by mouth 2 (two) times daily as needed for headache.  . clindamycin (CLEOCIN T) 1 % lotion Apply topically as needed.  . famotidine (PEPCID) 20 MG tablet Take by mouth.  . fluticasone (FLONASE) 50 MCG/ACT nasal spray Place 2 sprays into both nostrils daily.   Marland Kitchen ipratropium (ATROVENT) 0.06 % nasal spray Place 2 sprays into both nostrils in the morning and at bedtime.  Marland Kitchen olmesartan (BENICAR) 20 MG tablet Take      1/2 to 1 tablet        at Night for BP  . OVER THE COUNTER MEDICATION Take 2 capsules by mouth at bedtime. serenagen otc supplement  . RABEprazole (ACIPHEX) 20 MG tablet Take 1 tablet (20 mg total) by mouth 2 (two) times daily.  . tadalafil (CIALIS) 5 MG tablet Take 5 mg by mouth daily as needed for erectile dysfunction.  . tamsulosin (FLOMAX) 0.4 MG  CAPS capsule TAKE 1 CAPSULE AT BEDTIME FOR PROSTATE  . testosterone cypionate (DEPOTESTOSTERONE CYPIONATE) 200 MG/ML injection INJECT 2 ML INTO A MUSCLE EVERY 2 WEEKS  . triamcinolone cream (KENALOG) 0.1 % Apply 1 application topically 2 (two) times daily as needed for itching.  . Suvorexant (BELSOMRA) 15 MG TABS Take at 1 hour before intended bedtime, avoid screen and LED light. (Patient not taking: Reported on 12/17/2020)  . venlafaxine (EFFEXOR) 37.5 MG tablet Take 37.5 mg by mouth daily. (Patient not taking: Reported on 12/17/2020)   No current facility-administered medications on file prior to visit.     Allergies:  Allergies  Allergen Reactions  . Allegra [Fexofenadine] Other (See Comments)    Worsened insomnia  . Benzoyl Peroxide     blisters  . Doxycycline Nausea  Only  . Levaquin [Levofloxacin] Nausea And Vomiting    Patient states that after he took levaquin this past time he had nausea with vomiting     Medical History:  Past Medical History:  Diagnosis Date  . Allergy   . Chronic cough   . Chronic sinusitis   . Diverticulitis 2018   Mild  . Diverticulosis of colon (without mention of hemorrhage)   . Early satiety   . Esophageal motility disorder 12/2018  . Family history of adverse reaction to anesthesia    mom has PONV  . GERD (gastroesophageal reflux disease)   . History of left inguinal hernia   . Hypertension   . Insomnia   . Lyme disease    QUESTIONABLE  . Pulmonary nodules 10/2018   Scattered tiny subpleural pulmonary nodules at the peripheral right lung base, largest 3 mm  . Right inguinal hernia 2017   Small fat containing right inguinal hernia  . Sleep deficient   . Testosterone deficiency   . Vitamin D deficiency   . Wears glasses    Family history- Reviewed and unchanged Social history- Reviewed and unchanged   Review of Systems:  Review of Systems  Constitutional: Negative for malaise/fatigue and weight loss.  HENT: Negative for hearing loss and tinnitus.   Eyes: Negative for blurred vision and double vision.  Respiratory: Positive for cough (chronic). Negative for hemoptysis, sputum production, shortness of breath and wheezing.   Cardiovascular: Negative for chest pain, palpitations, orthopnea, claudication and leg swelling.  Gastrointestinal: Negative for abdominal pain, blood in stool, constipation, diarrhea, heartburn, melena, nausea and vomiting.  Genitourinary: Negative.   Musculoskeletal: Negative for joint pain and myalgias.  Skin: Negative for rash.  Neurological: Positive for dizziness and headaches (daily in afternoon, denies AM HA). Negative for tingling, sensory change and weakness.  Endo/Heme/Allergies: Negative for polydipsia.  Psychiatric/Behavioral: Negative for depression, hallucinations,  memory loss, substance abuse and suicidal ideas. The patient has insomnia. The patient is not nervous/anxious.   All other systems reviewed and are negative.   Physical Exam: BP 128/80   Pulse 72   Temp (!) 96.6 F (35.9 C)   Ht 5\' 9"  (1.753 m)   Wt 176 lb (79.8 kg)   SpO2 97%   BMI 25.99 kg/m  Wt Readings from Last 3 Encounters:  12/17/20 176 lb (79.8 kg)  09/07/20 168 lb 12.8 oz (76.6 kg)  06/18/20 172 lb 3.2 oz (78.1 kg)   General Appearance: Well nourished, in no apparent distress. Eyes: PERRLA, EOMs, conjunctiva no swelling or erythema Sinuses: No Frontal/maxillary tenderness ENT/Mouth: Ext aud canals clear, TMs without erythema, bulging. No erythema, swelling, or exudate on post pharynx.  Tonsils not swollen or  erythematous. Hearing normal.  Neck: Supple, thyroid normal.  Respiratory: Respiratory effort normal, BS equal bilaterally without rales, rhonchi, wheezing or stridor.  Cardio: RRR with no MRGs. Brisk peripheral pulses without edema.  Abdomen: Soft, + BS.  Non tender, no guarding, rebound, hernias, masses. Lymphatics: Non tender without lymphadenopathy.  Musculoskeletal: Full ROM, 5/5 strength, Normal gait Skin: Warm, dry without rashes, lesions, ecchymosis.  Neuro: Cranial nerves intact. No cerebellar symptoms.  Psych: Awake and oriented X 3, normal affect, Insight and Judgment appropriate.    Jay Ribas, NP 11:11 AM [facility]

## 2020-12-17 NOTE — Patient Instructions (Addendum)
Can try melatonin 5mg -15 mg at night for sleep,  can also do benadryl 25-50mg  at night for sleep.   Also here is some information about good sleep hygiene.   Insomnia Insomnia is frequent trouble falling and/or staying asleep. Insomnia can be a long term problem or a short term problem. Both are common. Insomnia can be a short term problem when the wakefulness is related to a certain stress or worry. Long term insomnia is often related to ongoing stress during waking hours and/or poor sleeping habits. Overtime, sleep deprivation itself can make the problem worse. Every little thing feels more severe because you are overtired and your ability to cope is decreased. CAUSES   Stress, anxiety, and depression.  Poor sleeping habits.  Distractions such as TV in the bedroom.  Naps close to bedtime.  Engaging in emotionally charged conversations before bed.  Technical reading before sleep.  Alcohol and other sedatives. They may make the problem worse. They can hurt normal sleep patterns and normal dream activity.  Stimulants such as caffeine for several hours prior to bedtime.  Pain syndromes and shortness of breath can cause insomnia.  Exercise late at night.  Changing time zones may cause sleeping problems (jet lag). It is sometimes helpful to have someone observe your sleeping patterns. They should look for periods of not breathing during the night (sleep apnea). They should also look to see how long those periods last. If you live alone or observers are uncertain, you can also be observed at a sleep clinic where your sleep patterns will be professionally monitored. Sleep apnea requires a checkup and treatment. Give your caregivers your medical history. Give your caregivers observations your family has made about your sleep.  SYMPTOMS   Not feeling rested in the morning.  Anxiety and restlessness at bedtime.  Difficulty falling and staying asleep. TREATMENT   Your  caregiver may prescribe treatment for an underlying medical disorders. Your caregiver can give advice or help if you are using alcohol or other drugs for self-medication. Treatment of underlying problems will usually eliminate insomnia problems.  Medications can be prescribed for short time use. They are generally not recommended for lengthy use.  Over-the-counter sleep medicines are not recommended for lengthy use. They can be habit forming.  You can promote easier sleeping by making lifestyle changes such as:  Using relaxation techniques that help with breathing and reduce muscle tension.  Exercising earlier in the day.  Changing your diet and the time of your last meal. No night time snacks.  Establish a regular time to go to bed.  Counseling can help with stressful problems and worry.  Soothing music and white noise may be helpful if there are background noises you cannot remove.  Stop tedious detailed work at least one hour before bedtime. HOME CARE INSTRUCTIONS   Keep a diary. Inform your caregiver about your progress. This includes any medication side effects. See your caregiver regularly. Take note of:  Times when you are asleep.  Times when you are awake during the night.  The quality of your sleep.  How you feel the next day. This information will help your caregiver care for you.  Get out of bed if you are still awake after 15 minutes. Read or do some quiet activity. Keep the lights down. Wait until you feel sleepy and go back to bed.  Keep regular sleeping and waking hours. Avoid naps.  Exercise regularly.  Avoid distractions at  bedtime. Distractions include watching television or engaging in any intense or detailed activity like attempting to balance the household checkbook.  Develop a bedtime ritual. Keep a familiar routine of bathing, brushing your teeth, climbing into bed at the same time each night, listening to soothing music. Routines increase the success  of falling to sleep faster.  Use relaxation techniques. This can be using breathing and muscle tension release routines. It can also include visualizing peaceful scenes. You can also help control troubling or intruding thoughts by keeping your mind occupied with boring or repetitive thoughts like the old concept of counting sheep. You can make it more creative like imagining planting one beautiful flower after another in your backyard garden.  During your day, work to eliminate stress. When this is not possible use some of the previous suggestions to help reduce the anxiety that accompanies stressful situations. MAKE SURE YOU:   Understand these instructions.  Will watch your condition.  Will get help right away if you are not doing well or get worse. Document Released: 10/21/2000 Document Revised: 01/16/2012 Document Reviewed: 11/21/2007 [facility] Patient Information 2015 [place], [place]. This information is not intended to replace advice given to you by your health care provider. Make sure you discuss any questions you have with your health care provider.      A great goal to work towards is aiming to get in a serving daily of some of the most nutritionally dense foods - G- BOMBS daily            Zoster Vaccine, Recombinant injection What is this medicine? ZOSTER VACCINE (ZOS ter vak SEEN) is a vaccine used to reduce the risk of getting shingles. This vaccine is not used to treat shingles or nerve pain from shingles. This medicine may be used for other purposes; ask your health care provider or pharmacist if you have questions. COMMON BRAND NAME(S): [facility] What should I tell my health care provider before I take this medicine? They need to know if you have any of these conditions:  cancer  immune system problems  an unusual or allergic reaction to Zoster vaccine, other medications, foods, dyes, or preservatives  pregnant or trying to get pregnant  breast-feeding How  should I use this medicine? This vaccine is injected into a muscle. It is given by a health care provider. A copy of Vaccine Information Statements will be given before each vaccination. Be sure to read this information carefully each time. This sheet may change often. Talk to your health care provider about the use of this vaccine in children. This vaccine is not approved for use in children. Overdosage: If you think you have taken too much of this medicine contact a poison control center or emergency room at once. NOTE: This medicine is only for you. Do not share this medicine with others. What if I miss a dose? Keep appointments for follow-up (booster) doses. It is important not to miss your dose. Call your health care provider if you are unable to keep an appointment. What may interact with this medicine?  medicines that suppress your immune system  medicines to treat cancer  steroid medicines like prednisone or cortisone This list may not describe all possible interactions. Give your health care provider a list of all the medicines, herbs, non-prescription drugs, or dietary supplements you use. Also tell them if you smoke, drink alcohol, or use illegal drugs. Some items may interact with your medicine. What should I watch for while using this medicine? Visit  your health care provider regularly. This vaccine, like all vaccines, may not fully protect everyone. What side effects may I notice from receiving this medicine? Side effects that you should report to your doctor or health care professional as soon as possible:  allergic reactions (skin rash, itching or hives; swelling of the face, lips, or tongue)  trouble breathing Side effects that usually do not require medical attention (report these to your doctor or health care professional if they continue or are bothersome):  chills  headache  fever  nausea  pain, redness, or irritation at site where  injected  tiredness  vomiting This list may not describe all possible side effects. Call your doctor for medical advice about side effects. You may report side effects to FDA at 1-800-FDA-1088. Where should I keep my medicine? This vaccine is only given by a health care provider. It will not be stored at home. NOTE: This sheet is a summary. It may not cover all possible information. If you have questions about this medicine, talk to your doctor, pharmacist, or health care provider.  2021 Elsevier/Gold Standard (2019-11-29 16:23:07)

## 2020-12-18 LAB — COMPLETE METABOLIC PANEL WITH GFR
AG Ratio: 1.9 (calc) (ref 1.0–2.5)
ALT: 33 U/L (ref 9–46)
AST: 31 U/L (ref 10–35)
Albumin: 4.6 g/dL (ref 3.6–5.1)
Alkaline phosphatase (APISO): 51 U/L (ref 35–144)
BUN: 21 mg/dL (ref 7–25)
CO2: 30 mmol/L (ref 20–32)
Calcium: 9.4 mg/dL (ref 8.6–10.3)
Chloride: 103 mmol/L (ref 98–110)
Creat: 1.01 mg/dL (ref 0.70–1.25)
GFR, Est African American: 93 mL/min/{1.73_m2} (ref >=60)
GFR, Est Non African American: 80 mL/min/{1.73_m2} (ref >=60)
Globulin: 2.4 g/dL (calc) (ref 1.9–3.7)
Glucose, Bld: 77 mg/dL (ref 65–99)
Potassium: 4 mmol/L (ref 3.5–5.3)
Sodium: 139 mmol/L (ref 135–146)
Total Bilirubin: 0.5 mg/dL (ref 0.2–1.2)
Total Protein: 7 g/dL (ref 6.1–8.1)

## 2020-12-18 LAB — LIPID PANEL
Cholesterol: 199 mg/dL (ref <200)
HDL: 41 mg/dL (ref >=40)
LDL Cholesterol (Calc): 118 mg/dL (calc) — ABNORMAL HIGH
Non-HDL Cholesterol (Calc): 158 mg/dL (calc) — ABNORMAL HIGH (ref <130)
Total CHOL/HDL Ratio: 4.9 (calc) (ref <5.0)
Triglycerides: 279 mg/dL — ABNORMAL HIGH (ref <150)

## 2020-12-18 LAB — CBC WITH DIFFERENTIAL/PLATELET
Absolute Monocytes: 721 {cells}/uL (ref 200–950)
Basophils Absolute: 32 {cells}/uL (ref 0–200)
Basophils Relative: 0.6 %
Eosinophils Absolute: 80 {cells}/uL (ref 15–500)
Eosinophils Relative: 1.5 %
HCT: 40.7 % (ref 38.5–50.0)
Hemoglobin: 14.3 g/dL (ref 13.2–17.1)
Lymphs Abs: 1346 {cells}/uL (ref 850–3900)
MCH: 33 pg (ref 27.0–33.0)
MCHC: 35.1 g/dL (ref 32.0–36.0)
MCV: 94 fL (ref 80.0–100.0)
MPV: 9.3 fL (ref 7.5–12.5)
Monocytes Relative: 13.6 %
Neutro Abs: 3122 {cells}/uL (ref 1500–7800)
Neutrophils Relative %: 58.9 %
Platelets: 172 Thousand/uL (ref 140–400)
RBC: 4.33 Million/uL (ref 4.20–5.80)
RDW: 12.4 % (ref 11.0–15.0)
Total Lymphocyte: 25.4 %
WBC: 5.3 Thousand/uL (ref 3.8–10.8)

## 2020-12-18 LAB — MAGNESIUM: Magnesium: 2.2 mg/dL (ref 1.5–2.5)

## 2020-12-18 LAB — TSH: TSH: 2.76 m[IU]/L (ref 0.40–4.50)

## 2020-12-21 ENCOUNTER — Telehealth: Payer: Self-pay | Admitting: Neurology

## 2020-12-21 ENCOUNTER — Encounter: Payer: Self-pay | Admitting: Neurology

## 2020-12-21 DIAGNOSIS — G4733 Obstructive sleep apnea (adult) (pediatric): Secondary | ICD-10-CM | POA: Insufficient documentation

## 2020-12-21 NOTE — Telephone Encounter (Signed)
[facility] ([facility])- not billed.   STUDY DATE: 12/02/20  DOB: Jan 09, 1961  MRN: [id number]  ORDERING CLINICIAN: Larey Seat, MD   REFERRING CLINICIAN: Unk Pinto, MD   CLINICAL INFORMATION/HISTORY: Jay Brooks ,a right handed  Caucasian male with a chronic sleep disorder, presented on 11-30-2020 for a repeat sleep study. He had developed a chronic insomnia, chronic cough, head pressure, lightheadedness. He is a Insurance underwriter.  Medical history of:  Chronic cough, Chronic sinusitis, Diverticulitis (2018), Diverticulosis of colon (without mention of hemorrhage), Early satiety, Esophageal motility disorder (12/2018), Family history of adverse reaction to anesthesia, GERD (gastroesophageal reflux disease), History of left inguinal hernia, Hypertension, Insomnia, Lyme disease, Pulmonary nodules (10/2018), Right inguinal hernia (2017), Sleep deficient, Testosterone deficiency, Vitamin D deficiency.   He has chronic insomnia problems, he uses Benadryl. 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 He flies all domestic routes, [place], [place])  Epworth sleepiness score: 11/24. BMI: 25.5 kg/m Neck Circumference: 17 "  FINDINGS:  Total Record Time (hours, min): 8 h 11 min Total Sleep Time (hours, min):  6 h 29 min  Percent REM (%):    14.89 %  Calculated pAHI (per hour):  31.3       REM pAHI: 34.5     NREM pAHI: 30.8 Supine AHI: 44.0   Oxygen Saturation (%) Mean: 94  Minimum oxygen saturation (%):        84   O2 Saturation Range (%): 84-98  O2Saturation (minutes) <=88%: 0.9 min   Pulse Mean (bpm):    64  Pulse Range (45-110)   IMPRESSION: This HST documented more severe OSA (obstructive sleep apnea) than the previous study. No central apneas were recorded. No prolonged hypoxia or abnormal heart rate was noted. Close to 15 % REM sleep was  calculated.     RECOMMENDATION:  I still recommend CPAP as most appropriate therapy, followed by inspire or dental device as second best therapies.     INTERPRETING PHYSICIAN:  Larey Seat, MD  [facility] and [facility] Sleep Board certified by The [employer] of Sleep Medicine and Diplomate of the [employer] of Sleep Medicine. Board certified In Neurology through the [address], Fellow of the [employer] of Neurology. Medical Director of [employer].

## 2020-12-21 NOTE — Telephone Encounter (Signed)
Pt is asking for a call back to discuss sleep study

## 2020-12-21 NOTE — Telephone Encounter (Signed)
Called the patient back. Reviewed the repeat sleep study. Informed the patient of the findings and the AHI being worse under this test. Pt is still awaiting a machine through [employer]. He is inquiring the status of another company getting him set up. Informed him I would look into it for him and be in touch with what they recommend. He was appreciative for the call back and explaining the results.

## 2021-01-05 NOTE — Progress Notes (Signed)
History of Present Illness:       This very nice 60 y.o.  MWM  with  HTN, HLD, Prediabetes, Testosterone Deficiency, OSA/CPAP  and Vitamin D Deficiency presents with c/o of sore throat & sinus congestion Cyndi Bender. Denies fever, chills, sweats, rigors, dyspnea or rash.   Medications  .  testosterone cypio 200 MG/ML injection, INJECT 2 ML  EVERY 2 WEEKS .  bisoprolol  10 MG tablet, Take 1/2 to 1 tablet Daily for BP .  olmesartan  20 MG tablet, Take  1/2 to 1 tablet daily .  tadalafil  5 MG tablet, Take 5 mg  daily as needed for erectile dysfunction. .  Azelastine HCl 137 MCG/SPRAY SOLN, Place 2 sprays into  nostrils daily. Marland Kitchen   FLONASE  nasal spray, Place 2 sprays into both nostrils daily.  Marland Kitchen  ipratropium  0.06 % nasal spray, Place 2 sprays into nostrils - morning & bedtime. Marland Kitchen  acetaminophen  500 MG tablet, Take  every 6 hours as needed for moderate pain  .  FIORICET, Take 2 times daily as needed for headache. Marland Kitchen  amitriptyline 25 MG t, Take 1 tablet  at bedtime as needed for sleep. Marland Kitchen  CLEOCIN T 1 % lotion, Apply topically as needed. .  famotidine 20 MG , Take daily .  serenagen otc - Take 2 capsules  at bedtime.  .  RABEprazole 20 MG , Take 1 tablet  2  times daily. .  tamsulosin  0.4 MG , TAKE 1 CAPSULE AT BEDTIME  .  triamcinolone crm 0.1 %, Apply topically 2  times daily as needed for itching.  Problem list He has Cough; GERD (gastroesophageal reflux disease); Chronic rhinitis; Testosterone Deficiency; Hyperlipidemia, mixed; Medication management; Labile hypertension; Abnormal glucose; Vitamin D deficiency; Prostatism; FHx: heart disease; Essential hypertension; Chronic insomnia; and OSA (obstructive sleep apnea) on their problem list.   Observations/Objective:  BP 124/82   Pulse 69   Temp 97.6 F (36.4 C)   Resp 16   Ht 5\' 9"  (1.753 m)   Wt 178 lb 6.4 oz (80.9 kg)   SpO2 99%   BMI 26.35 kg/m   HEENT -  EAc's / TM's - Nl . Sl Rt TMjt tender.  Sl Max sinus tenderness.   N/O/P 1-2 + injected soft palate & posterior wall w/o exudate.  Neck - supple.  Chest - Clear equal BS. Cor - Nl HS. RRR w/o sig MGR. PP 1(+). No edema. MS- FROM w/o deformities.  Gait Nl. Neuro -  Nl w/o focal abnormalities. Skin - clear w/o rash or lesions.  Assessment and Plan:  1. Acute recurrent pansinusitis  - dexamethasone (DECADRON) 4 MG tablet; Take 1 tab 3 x day - 3 days, then 2 x day - 3 days, then 1 tab daily  Dispense: 20 tablet; Refill: 0  2. Pharyngitis due to other organism  - dexamethasone (DECADRON) 4 MG tablet; Take 1 tab 3 x day - 3 days, then 2 x day - 3 days, then 1 tab daily  Dispense: 20 tablet; Refill: 0 - azithromycin (ZITHROMAX) 250 MG tablet; Take 2 tablets with Food on  Day 1, then 1 tablet Daily with Food for Sinusitis / Bronchitis  Dispense: 6 each; Refill: 1  Follow Up Instructions:      I discussed the assessment and treatment plan with the patient. The patient was provided an opportunity to ask questions and all were answered. The patient agreed with the plan and demonstrated an understanding of the  instructions.       The patient was advised to call back or seek an in-person evaluation if the symptoms worsen or if the condition fails to improve as anticipated.   Kirtland Bouchard, MD

## 2021-01-06 ENCOUNTER — Encounter: Payer: Self-pay | Admitting: Internal Medicine

## 2021-01-06 ENCOUNTER — Ambulatory Visit (INDEPENDENT_AMBULATORY_CARE_PROVIDER_SITE_OTHER): Payer: 59 | Admitting: Internal Medicine

## 2021-01-06 ENCOUNTER — Other Ambulatory Visit: Payer: Self-pay

## 2021-01-06 VITALS — BP 124/82 | HR 69 | Temp 97.6°F | Resp 16 | Ht 69.0 in | Wt 178.4 lb

## 2021-01-06 DIAGNOSIS — J028 Acute pharyngitis due to other specified organisms: Secondary | ICD-10-CM

## 2021-01-06 DIAGNOSIS — J0141 Acute recurrent pansinusitis: Secondary | ICD-10-CM

## 2021-01-06 MED ORDER — AZITHROMYCIN 250 MG PO TABS: ORAL_TABLET | ORAL | 1 refills | Status: DC

## 2021-01-06 MED ORDER — DEXAMETHASONE 4 MG PO TABS: ORAL_TABLET | ORAL | 0 refills | Status: DC

## 2021-01-06 MED ORDER — KETOTIFEN FUMARATE 0.025 % OP SOLN: OPHTHALMIC | 0 refills | Status: DC

## 2021-01-11 ENCOUNTER — Encounter: Payer: Self-pay | Admitting: Internal Medicine

## 2021-01-18 ENCOUNTER — Other Ambulatory Visit: Payer: Self-pay | Admitting: Neurology

## 2021-01-18 MED ORDER — BELSOMRA 20 MG PO TABS: 20.0000 mg | ORAL_TABLET | Freq: Every evening | ORAL | 5 refills | Status: DC

## 2021-01-18 MED ORDER — AMITRIPTYLINE HCL 50 MG PO TABS: 25.0000 mg | ORAL_TABLET | Freq: Every day | ORAL | 5 refills | Status: DC

## 2021-01-18 NOTE — Telephone Encounter (Signed)
Called the patient back and advised that Dr Brett Fairy recommends the patient do cognitive behavior therapy. He has tried this with two different professionals with no success.  Advised that Dr. Brett Fairy was okay with increasing the Belsomra to 20 mg at bedtime and the amitriptyline to 50 mg at bedtime.  Patient would like to do 1 at a time.  Informed the patient I would send a prescription in for belsomra 20 mg for the patient to take at bedtime and that may be an break the amitriptyline in half to get the 25 mg dosage.  Advised that he can increase to the 50 mg if he decides he would like to.  Patient was appreciative for the call back and recommendations.

## 2021-01-18 NOTE — Telephone Encounter (Signed)
Pt called stating that he has been unable to fall asleep before 3-4am for about a  Month now and this is with medication. Pt would like the RN to call him back to advise what he can do so that he can fall asleep at a decent time.

## 2021-01-18 NOTE — Telephone Encounter (Signed)
Called the patient back to confirm what he is taking at bedtime. Belsomra and Amitriptyline 25 mg QHS.  He states that he takes this and it does nothing and he is laying awake until 3/4 am.  He tried stopping the medication and tried melatonin and benadryl. He said benadryl helped some. He has tried in past [place], sonata, lunesta, alprazolam, temazepam.  Unsure about trazodone. States that he doesn't want something long term just something to help get his sleep rhythm back. He is trying to use the CPAP machine but due to lack of sleep unable to even get the minimum of 4 hrs required.  Advised I would see what Dr Brett Fairy thinks and recommends and then be in touch. I was able to go ahead and schedule a initial cpap follow up visit the end of April. Pt will await my call.

## 2021-01-18 NOTE — Addendum Note (Signed)
Addended by: Darleen Crocker on: 01/18/2021 05:15 PM   Modules accepted: Orders

## 2021-01-30 ENCOUNTER — Encounter: Payer: Self-pay | Admitting: Neurology

## 2021-02-15 ENCOUNTER — Telehealth: Payer: Self-pay | Admitting: Neurology

## 2021-02-15 DIAGNOSIS — G4733 Obstructive sleep apnea (adult) (pediatric): Secondary | ICD-10-CM

## 2021-02-15 NOTE — Telephone Encounter (Signed)
Pt called wanting to discuss with RN about his trouble adjusting and sleeping with the CPAP. Please advise.

## 2021-02-16 NOTE — Telephone Encounter (Addendum)
I called the pt back. He is having trouble adjusting to the CPAP. He feels like he can't get enough air through the nasal piece and is having to keep taking a deep breath through the night. He feels his breathing and sleeping is worse with CPAP machine than without.He did get 5 hours of sleep one night but last night didn't get any. He tries to make it through the 4 hours each night. He feels if he could just go to sleep with it on he would be ok. He called DME company yesterday and requested a full face mask to try to see if this helps. He is waiting on them to call him back and was told it should not be a problem to switch out. He has tried everything to adapt, wearing mask before bed and while he watches TV before bed. He also wanted to know what Dr Brett Fairy thought about a dental device.  He feels the Belsomra and Amitriptyline drug him the next day. He has decreased his Belsomra to 15 mg and thinks this works better than 20 mg. He does take the Amitriptyline 50 mg. The past 4-5 nights he has used Benadryl 50 mg instead of the Belsomra and Amitriptyline to help himself get to sleep but still does not have much luck. He will wait for a call back with recommendations.   I have pulled the CPAP report for review. Pt has upcoming appt on 03/01/21.

## 2021-02-16 NOTE — Telephone Encounter (Signed)
Spoke with Myriam Jacobson, Dr. Edwena Felty nurse.  Patient's report currently not showing any problems with leak and the nights he is using it greater than 4 hours his apnea is controlled.  Spoke with the patient and I encouraged him to continue trying to use the CPAP at least 4 hours every night.  Also let him know as before that we would send the message over to Dr. Brett Fairy and would call him if there are any further suggestions.  Patient stated he was waiting on a clinician with [facility] to call him back regarding possible change of mask.  I let him know from what we can tell on the report he may not need another one but he is welcome to discuss this with their clinical team as well as discuss his concerns/questions with Dr Brett Fairy at the follow-up on April 25th. Pt verbalized appreciation for the call.

## 2021-02-17 NOTE — Addendum Note (Signed)
Addended by: Gildardo Griffes on: 02/17/2021 12:05 PM   Modules accepted: Orders

## 2021-02-17 NOTE — Telephone Encounter (Signed)
Called pt & LVM asking for call back.  

## 2021-02-17 NOTE — Telephone Encounter (Signed)
413 pm-the patient returned my call and I discussed with him Dr Dohmeier's recommendations as noted below. Patient is aware the EPR has been changed to 3. He will discuss possible mask exchange with [facility]. Suggested Nasonex if he felt he had nasal congestion to help clear nasal passages. Patient stated he already took Flonase and he feels this effective. Plan to f/u with Dr Brett Fairy and address his further sleep apnea & insomnia questions at that time. Pt was very appreciative for the call.

## 2021-02-17 NOTE — Telephone Encounter (Addendum)
Spoke with Dr Brett Fairy. She is fine with the patient trying a full face mask to see if this helps better. She also gave v.o. to change EPR to 3. Patient will be encouraged to try these new changes and will f/u with Dr Brett Fairy as scheduled on 4/25. Also per Dr Brett Fairy, patient can use Nasonex to ensure optimal nasal patency.

## 2021-02-23 ENCOUNTER — Other Ambulatory Visit: Payer: Self-pay | Admitting: Internal Medicine

## 2021-03-01 ENCOUNTER — Ambulatory Visit (INDEPENDENT_AMBULATORY_CARE_PROVIDER_SITE_OTHER): Payer: 59 | Admitting: Neurology

## 2021-03-01 ENCOUNTER — Encounter: Payer: Self-pay | Admitting: Neurology

## 2021-03-01 ENCOUNTER — Other Ambulatory Visit: Payer: Self-pay

## 2021-03-01 VITALS — BP 112/74 | HR 57 | Ht 69.0 in | Wt 173.5 lb

## 2021-03-01 DIAGNOSIS — F5104 Psychophysiologic insomnia: Secondary | ICD-10-CM | POA: Diagnosis not present

## 2021-03-01 DIAGNOSIS — J310 Chronic rhinitis: Secondary | ICD-10-CM

## 2021-03-01 DIAGNOSIS — K219 Gastro-esophageal reflux disease without esophagitis: Secondary | ICD-10-CM

## 2021-03-01 NOTE — Progress Notes (Signed)
SLEEP MEDICINE CLINIC    Provider:  Larey Seat, MD  Primary Care Physician:  Unk Pinto, MD [address] [place] [address]     Referring Provider: Unk Pinto, [place] [address] [place],  [place] [id number]          Chief Complaint according to patient   Patient presents with:    . New Patient (Initial Visit)     pt alone, rm 10. presents today for concerns of chronic sleep concerns and FAA regulations- . 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 states that he now has a regular type schedule but finds that he still avg 4-5 hrs a night, he has had 2 normal PSGs completed with [facility] and [facility] .   He was now diagnosed with OSA and tried multiple masks.       HISTORY OF PRESENT ILLNESS:  Jay Brooks is a 60 y.o. year old  Caucasian male patient seen here upon a referral on 03/01/2021  The patient underwent a home sleep test by watch pat this was his third sleep study ,  This home sleep test documented an AHI of 31.3 strongly dependent on supine sleep position also sleeping on your back.  Sleep he usually avoids that at home, there was not much REM sleep dependency noted there was no clinical significant hypoxemia noted and he had a normal pulse range variability between 45 and 110 bpm this is entirely normal for age and gender.  Since a home sleep test that the documented severe OSA he just tipped the 13th apnea marked without any central apneas.  He felt that CPAP would be the best option however CPAP has not been helpful with his second main sleep disorder chronic insomnia.  His AHI is compliant use of CPAP has been 0.7 so this is a very significant reduction the 95th percentile pressure he needs is only 6 cmH2O minimum pressure was set at 5 maximum pressure at 15 and median usage on days used is just 4 hours 2 minutes.  However there were several days where the machine was not  used at all.  What I like to do is he has tried several masks but has not found any of them very comfortable, his medication list is fairly unchanged.  He endorsed the Epworth sleepiness score at 3 points and the fatigue severity at 9 points.   My question for the patient is if he would rather consider an inspire device than using CPAP.  He reports being unhappy with CPAP.       See first 80-02 2021 from dr. Melford Aase, his PCP.  Chief concern according to patient :  I can't fall asleep and have trouble with fatigue " . As long as a stay active and stimulated, I wont get sleepy.  I yawn a lot while driving"   I have the pleasure of seeing Jay Brooks , a right handed  Caucasian male with a possible chronic sleep disorder.  he developed a chronic insomnia, chronic cough, head pressure, lightheadedness.  He  has a past medical history of Allergy, Chronic cough, Chronic sinusitis, Diverticulitis (2018), Diverticulosis of colon (without mention of hemorrhage), Early satiety, Esophageal motility disorder (12/2018), Family history of adverse reaction to anesthesia, GERD (gastroesophageal reflux disease), History of left inguinal hernia, Hypertension, Insomnia, Lyme disease, Pulmonary nodules (10/2018), Right inguinal hernia (2017), Sleep deficient, Testosterone deficiency, Vitamin D  deficiency, and Wears glasses.  patient is under FAA restriction for sleep aids, SSRI and Tricyclica.  2) Vertigo started with BP medication adjustment, BP currently well controlled, but still some  lightheadedness.  3) Mucinex in AM- clear sinus, use a sinus rinse. 4) GERD - continue Pepcid at bedtime    He has chronic insomnia problems, he uses Benadryl. 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 He flies all  domestic, [place])  The patient had the first sleep study in the year 2005 with a negative  result of "no apnea present"    Sleep relevant medical history: Nocturia once, no Tonsillectomy. Head injury in childhood.   Family medical /sleep history:mother died of pancreatic cancer.  no other family member with insomnia, maternal uncle was obese and had OSA.   Social history:  Patient is working as a Insurance underwriter and out-of work since 08-2018 and lives in a household with spouse and a college age daughter is home right now. Family status is married  With one daughter. The patient currently is out- of work.  Pets are present. One cat.  Tobacco use: none .  ETOH use : seldomly ,  Caffeine intake in form of Coffee( in AM ) Soda( lunchtime) Tea ( afternoon ) no energy drinks. Regular exercise none.   Sleep habits are as follows: The patient's dinner time is between 6 PM. The patient goes to bed at 11.30 PM and he often sleeps not before midnight. continues to sleep for 4-6 hours, wakes for one bathroom break.   The bedroom is cool, quiet and dark- wife snores. TV is not used .  The preferred sleep position is right sided. , with the support of 1 pillow. Dreams are reportedly rare. He sometimes yells. 6-7  AM is the usual rise time. The patient wakes up spontaneously.  He  reports not feeling refreshed or restored in AM, with symptoms such as dry mouth, morning headaches and sinus pressure , and residual fatigue.  Naps are taken infrequently.   Review of Systems: Out of a complete 14 system review, the patient complains of only the following symptoms, and all other reviewed systems are negative.:  Fatigue, sleepiness , snoring, fragmented sleep, Insomnia.   How likely are you to doze in the following situations: 0 = not likely, 1 = slight chance, 2 = moderate chance, 3 = high chance   Sitting and Reading? Watching Television? Sitting inactive in a public place (theater or meeting)? As a passenger in a car for an hour without a break? Lying down in the afternoon when circumstances  permit? Sitting and talking to someone? Sitting quietly after lunch without alcohol? In a car, while stopped for a few minutes in traffic?   Total = 11/ 24 points   FSS endorsed at  9 - down from 38/ 63 points.   Social History   Socioeconomic History  . Marital status: Married    Spouse name: Not on file  . Number of children: 1  . Years of education: Not on file  . Highest education level: Not on file  Occupational History  . Occupation: PILOT    Employer: [employer]  Tobacco Use  . Smoking status: Never Smoker  . Smokeless tobacco: Never Used  Vaping Use  . Vaping Use: Never used  Substance and Sexual Activity  .  Alcohol use: Not Currently    Comment: occ  . Drug use: No  . Sexual activity: Not on file  Other Topics Concern  . Not on file  Social History Narrative   Daily caffeine    Social Determinants of Health   Financial Resource Strain: Not on file  Food Insecurity: Not on file  Transportation Needs: Not on file  Physical Activity: Not on file  Stress: Not on file  Social Connections: Not on file    Family History  Problem Relation Age of Onset  . Breast cancer Maternal Grandmother   . Colon polyps Brother   . Colon polyps Maternal Uncle   . Cancer Mother 17       pancreatic  . Colon cancer Neg Hx   . Esophageal cancer Neg Hx   . Stomach cancer Neg Hx   . Ulcerative colitis Neg Hx     Past Medical History:  Diagnosis Date  . Allergy   . Chronic cough   . Chronic sinusitis   . Diverticulitis 2018   Mild  . Diverticulosis of colon (without mention of hemorrhage)   . Early satiety   . Esophageal motility disorder 12/2018  . Family history of adverse reaction to anesthesia    mom has PONV  . GERD (gastroesophageal reflux disease)   . History of left inguinal hernia   . Hypertension   . Insomnia   . Lyme disease    QUESTIONABLE  . Pulmonary nodules 10/2018   Scattered tiny subpleural pulmonary nodules at the peripheral right lung  base, largest 3 mm  . Right inguinal hernia 2017   Small fat containing right inguinal hernia  . Sleep deficient   . Testosterone deficiency   . Vitamin D deficiency   . Wears glasses     Past Surgical History:  Procedure Laterality Date  . BRAVO [place] STUDY N/A 05/02/2019   Procedure: BRAVO [place] STUDY;  Surgeon: Milus Banister, MD;  Location: [facility];  Service: Endoscopy;  Laterality: N/A;  . COLONOSCOPY    . ESOPHAGOGASTRODUODENOSCOPY (EGD) WITH PROPOFOL N/A 05/02/2019   Procedure: ESOPHAGOGASTRODUODENOSCOPY (EGD) WITH PROPOFOL;  Surgeon: Milus Banister, MD;  Location: [facility];  Service: Endoscopy;  Laterality: N/A;  . HERNIA REPAIR    . TOE SURGERY     INGROWN TOE NAIL, CHILDHOOD  . UPPER GASTROINTESTINAL ENDOSCOPY    . VASECTOMY       Current Outpatient Medications on File Prior to Visit  Medication Sig Dispense Refill  . amitriptyline (ELAVIL) 50 MG tablet Take 0.5-1 tablets (25-50 mg total) by mouth at bedtime. 30 tablet 5  . Azelastine HCl 137 MCG/SPRAY SOLN Place 2 sprays into both nostrils daily.    Marland Kitchen azithromycin (ZITHROMAX) 250 MG tablet Take 2 tablets with Food on  Day 1, then 1 tablet Daily with Food for Sinusitis / Bronchitis 6 each 1  . bisoprolol (ZEBETA) 10 MG tablet Take 1/2 to 1 tablet Daily for BP 90 tablet 1  . clindamycin (CLEOCIN T) 1 % lotion Apply topically as needed.    . famotidine (PEPCID) 20 MG tablet Take by mouth.    . fluticasone (FLONASE) 50 MCG/ACT nasal spray Place 2 sprays into both nostrils daily.     Marland Kitchen ipratropium (ATROVENT) 0.06 % nasal spray Place 2 sprays into both nostrils in the morning and at bedtime.    Marland Kitchen ketotifen (ZADITOR) 0.025 % ophthalmic solution Uses prn 5 mL 0  . loratadine (CLARITIN) 10 MG tablet Take 10  mg by mouth daily.    Marland Kitchen olmesartan (BENICAR) 20 MG tablet TAKE 1/2 TO 1 TABLET AT NIGHT FOR BP 90 tablet 0  . OVER THE COUNTER MEDICATION Take 2 capsules by mouth at bedtime. serenagen otc supplement    . RABEprazole  (ACIPHEX) 20 MG tablet Take 1 tablet (20 mg total) by mouth 2 (two) times daily. 60 tablet 6  . Suvorexant (BELSOMRA) 20 MG TABS Take 20 mg by mouth at bedtime. 30 tablet 5  . tadalafil (CIALIS) 5 MG tablet Take 5 mg by mouth daily as needed for erectile dysfunction.    . tamsulosin (FLOMAX) 0.4 MG CAPS capsule TAKE 1 CAPSULE AT BEDTIME FOR PROSTATE 90 capsule 2  . triamcinolone cream (KENALOG) 0.1 % Apply 1 application topically 2 (two) times daily as needed for itching.     No current facility-administered medications on file prior to visit.    Allergies  Allergen Reactions  . Allegra [Fexofenadine] Other (See Comments)    Worsened insomnia  . Benzoyl Peroxide     blisters  . Decadron [Dexamethasone]     Reflux & Hiccups, GI upset  . Doxycycline Nausea Only  . Levaquin [Levofloxacin] Nausea And Vomiting    Patient states that after he took levaquin this past time he had nausea with vomiting    Physical exam:  Today's Vitals   03/01/21 1311  BP: 112/74  Pulse: (!) 57  Weight: 173 lb 8 oz (78.7 kg)  Height: 5\' 9"  (1.753 m)   Body mass index is 25.62 kg/m.   Wt Readings from Last 3 Encounters:  03/01/21 173 lb 8 oz (78.7 kg)  01/06/21 178 lb 6.4 oz (80.9 kg)  12/17/20 176 lb (79.8 kg)     Ht Readings from Last 3 Encounters:  03/01/21 5\' 9"  (1.753 m)  01/06/21 5\' 9"  (1.753 m)  12/17/20 5\' 9"  (1.753 m)      General: The patient is awake, alert and appears not in acute distress. The patient is well groomed. Head: Normocephalic, atraumatic. Neck is supple. Mallampati 2- very exaggerated gag reflex. ,  neck circumference: 17 inches . Nasal airflow patent.  Retrognathia is  seen.   Cardiovascular:  Regular rate and cardiac rhythm by pulse,  without distended neck veins. Respiratory: Lungs are clear to auscultation.  Skin:  Without evidence of ankle edema, or rash. Trunk: The patient's posture is erect.   Neurologic exam : The patient is awake and alert, oriented to  place and time.   Memory subjective described as intact.  Attention span & concentration ability appears normal.  Speech is fluent,  without  dysarthria, dysphonia or aphasia.  Mood and affect are appropriate.   Cranial nerves: no loss of smell or taste reported  Pupils are equal and briskly reactive to light. Funduscopic exam deferred.  Extraocular movements in vertical and horizontal planes were intact and without nystagmus. No Diplopia. senistivity to sinus pressure points around the orbit. Visual fields by finger perimetry are intact. Hearing was intact to soft voice and finger rubbing.  Facial sensation intact to fine touch.  Facial motor strength is symmetric and tongue and uvula move midline.  Neck ROM : rotation, tilt and flexion extension were normal for age and shoulder shrug was symmetrical.    Motor exam:  Symmetric bulk, tone and ROM.   Normal tone without cog wheeling, symmetric grip strength .   Sensory:  Fine touch, pinprick and vibration were tested  and  normal.  Proprioception tested in the  upper extremities was normal. Coordination: Rapid alternating movements in the fingers/hands were of normal speed.  The Finger-to-nose maneuver was intact without evidence of ataxia, dysmetria or tremor. Gait and station: Patient could rise unassisted from a seated position, walked without assistive device.  Stance is of normal width/ base and the patient turned with 3 steps.  Toe and heel walk were deferred.  Deep tendon reflexes: in the  upper and lower extremities are symmetric and intact.  Babinski response was deferred.       After spending a total time of 25  minutes face to face and additional time for physical and neurologic examination, review of laboratory studies,  personal review of imaging studies, reports and results of other testing and review of referral information / records as far as provided in visit, I have established the following assessments:  Chronic  insomnia , previous evaluations have not found an organic cause.  I would recommend a cognitive behaviour therapy if our repeat sleep study gives similar results.  Sinus CT was normal. Dr Benjamine Mola.  FAA - he is required to treat his apnea- and can't tolerate CPAP- Insomnia. He does not take sleep aids that were provided- he has not been compliant with either. He cannot initiate sleep- !    My Plan is to proceed with:  Repeat HST at no costs.  I will refer for [facility] if Sleep apnea is conducive for this therapy.    I would like to thank Dr Benjamine Mola, and Unk Pinto, [place],  [place] [address] for allowing me to meet with and to take care of this pleasant patient.   In short, Jay Brooks is presenting with a HST from last year that diagnosed severe OSA- not witnessed by wife- ( HST based dx:may be error ) fatigue and non- organic sleep insomnia.   [place] approved inspire for treatment.    Electronically signed by: Larey Seat, MD 03/01/2021 2:19 PM  [facility] Board certified by The [employer] of Sleep Medicine and Diplomate of the [employer] of Sleep Medicine. Board certified In Neurology through the [place], Fellow of the [employer] of Neurology. Medical Director of [facility].

## 2021-03-01 NOTE — Patient Instructions (Signed)
We will repeat a sleep study to confirm the presence of apnea.  We will refer for [facility] of appropriate.  We will keep in mind that you need an FAA approved therapy.     Insomnia Insomnia is a sleep disorder that makes it difficult to fall asleep or stay asleep. Insomnia can cause fatigue, low energy, difficulty concentrating, mood swings, and poor performance at work or school. There are three different ways to classify insomnia:  Difficulty falling asleep.  Difficulty staying asleep.  Waking up too early in the morning. Any type of insomnia can be long-term (chronic) or short-term (acute). Both are common. Short-term insomnia usually lasts for three months or less. Chronic insomnia occurs at least three times a week for longer than three months. What are the causes? Insomnia may be caused by another condition, situation, or substance, such as:  Anxiety.  Certain medicines.  Gastroesophageal reflux disease (GERD) or other gastrointestinal conditions.  Asthma or other breathing conditions.  Restless legs syndrome, sleep apnea, or other sleep disorders.  Chronic pain.  Menopause.  Stroke.  Abuse of alcohol, tobacco, or illegal drugs.  Mental health conditions, such as depression.  Caffeine.  Neurological disorders, such as Alzheimer's disease.  An overactive thyroid (hyperthyroidism). Sometimes, the cause of insomnia may not be known. What increases the risk? Risk factors for insomnia include:  Gender. Women are affected more often than men.  Age. Insomnia is more common as you get older.  Stress.  Lack of exercise.  Irregular work schedule or working night shifts.  Traveling between different time zones.  Certain medical and mental health conditions. What are the signs or symptoms? If you have insomnia, the main symptom is having trouble falling asleep or having trouble staying asleep. This may lead to other symptoms, such as:  Feeling fatigued or  having low energy.  Feeling nervous about going to sleep.  Not feeling rested in the morning.  Having trouble concentrating.  Feeling irritable, anxious, or depressed. How is this diagnosed? This condition may be diagnosed based on:  Your symptoms and medical history. Your health care provider may ask about: ? Your sleep habits. ? Any medical conditions you have. ? Your mental health.  A physical exam. How is this treated? Treatment for insomnia depends on the cause. Treatment may focus on treating an underlying condition that is causing insomnia. Treatment may also include:  Medicines to help you sleep.  Counseling or therapy.  Lifestyle adjustments to help you sleep better. Follow these instructions at home: Eating and drinking  Limit or avoid alcohol, caffeinated beverages, and cigarettes, especially close to bedtime. These can disrupt your sleep.  Do not eat a large meal or eat spicy foods right before bedtime. This can lead to digestive discomfort that can make it hard for you to sleep.   Sleep habits  Keep a sleep diary to help you and your health care provider figure out what could be causing your insomnia. Write down: ? When you sleep. ? When you wake up during the night. ? How well you sleep. ? How rested you feel the next day. ? Any side effects of medicines you are taking. ? What you eat and drink.  Make your bedroom a dark, comfortable place where it is easy to fall asleep. ? Put up shades or blackout curtains to block light from outside. ? Use a white noise machine to block noise. ? Keep the temperature cool.  Limit screen use before bedtime. This includes: ? Watching  TV. ? Using your smartphone, tablet, or computer.  Stick to a routine that includes going to bed and waking up at the same times every day and night. This can help you fall asleep faster. Consider making a quiet activity, such as reading, part of your nighttime routine.  Try to avoid  taking naps during the day so that you sleep better at night.  Get out of bed if you are still awake after 15 minutes of trying to sleep. Keep the lights down, but try reading or doing a quiet activity. When you feel sleepy, go back to bed.   General instructions  Take over-the-counter and prescription medicines only as told by your health care provider.  Exercise regularly, as told by your health care provider. Avoid exercise starting several hours before bedtime.  Use relaxation techniques to manage stress. Ask your health care provider to suggest some techniques that may work well for you. These may include: ? Breathing exercises. ? Routines to release muscle tension. ? Visualizing peaceful scenes.  Make sure that you drive carefully. Avoid driving if you feel very sleepy.  Keep all follow-up visits as told by your health care provider. This is important. Contact a health care provider if:  You are tired throughout the day.  You have trouble in your daily routine due to sleepiness.  You continue to have sleep problems, or your sleep problems get worse. Get help right away if:  You have serious thoughts about hurting yourself or someone else. If you ever feel like you may hurt yourself or others, or have thoughts about taking your own life, get help right away. You can go to your nearest emergency department or call:  Your local emergency services (911 in the [place]  A suicide crisis helpline, such as the [facility] at (501)267-5698. This is open 24 hours a day. Summary  Insomnia is a sleep disorder that makes it difficult to fall asleep or stay asleep.  Insomnia can be long-term (chronic) or short-term (acute).  Treatment for insomnia depends on the cause. Treatment may focus on treating an underlying condition that is causing insomnia.  Keep a sleep diary to help you and your health care provider figure out what could be causing your  insomnia. This information is not intended to replace advice given to you by your health care provider. Make sure you discuss any questions you have with your health care provider. Document Revised: 09/03/2020 Document Reviewed: 09/03/2020 Elsevier Patient Education  2021 [employer].

## 2021-03-03 ENCOUNTER — Telehealth: Payer: Self-pay | Admitting: Neurology

## 2021-03-03 NOTE — Telephone Encounter (Signed)
Called the patient and based off of the conversation with Dr. Brett Fairy on Monday the patient has not tolerated CPAP.  He followed with his ENT physician after the visit who indicates that he can understand why the patient is not tolerating CPAP.  Patient has a deviated septum and a very small airway opening which can cause difficulty with CPAP.  At this point patient is wanting to discontinue CPAP.  Advised the patient to contact the company to see how they go about getting the machine back.  Advised if needed will discontinue order to let me know and I will send that.  We are waiting to see if in lab study will get approved.  Once the sleep study is completed and we have the results we will move forward with referral to the inspire.  Patient was appreciative of the call back.

## 2021-03-03 NOTE — Telephone Encounter (Signed)
Pt called, wanting to discuss whether to turn in CPAP machine. From my ENT follow up; would have difficulty adapting a CPAP machine. Would like a call from the nurse.

## 2021-03-11 ENCOUNTER — Telehealth: Payer: Self-pay | Admitting: Neurology

## 2021-03-11 NOTE — Telephone Encounter (Signed)
Pt is wanting to hold off on his sleep study until he has his nasal surgery on 06-15, please call

## 2021-03-15 ENCOUNTER — Other Ambulatory Visit: Payer: Self-pay | Admitting: Otolaryngology

## 2021-03-15 NOTE — Telephone Encounter (Signed)
Home sleep test has been cancelled. Pt will call the sleep lab back reschedule this test after he has recovered from nasal surgery in June 2022.

## 2021-03-22 ENCOUNTER — Encounter: Payer: Self-pay | Admitting: Internal Medicine

## 2021-03-22 NOTE — Patient Instructions (Signed)

## 2021-03-22 NOTE — Progress Notes (Signed)
Future Appointments  Date Time Provider [place]  03/23/2021 10:30 AM Unk Pinto, MD [facility] None  09/14/2021  3:00 PM Unk Pinto, MD [facility] None    History of Present Illness:       This very nice 60 y.o. MWM presents for 6 month follow up with HTN, HLD, Pre-Diabetes and Vitamin D Deficiency.  Patient has GERD controlled on his meds. Patient is scheduled for nasal septoplasty on 04/21/2021 by Dr Benjamine Mola. Patient is also followed by Dr Brett Fairy fro OSA & Chronic Insomnia.        Patient is treated for HTN & BP has been controlled at home. Today's BP is at goal - 120/74. Patient has had no complaints of any cardiac type chest pain, palpitations, dyspnea / orthopnea / PND, dizziness, claudication, or dependent edema.       Hyperlipidemia is controlled with diet & meds. Patient denies myalgias or other med SE's. Last Lipids were not at goal:  Lab Results  Component Value Date   CHOL 199 12/17/2020   HDL 41 12/17/2020   LDLCALC 118 (H) 12/17/2020   TRIG 279 (H) 12/17/2020   CHOLHDL 4.9 12/17/2020    Also, the patient has history of PreDiabetes and has had no symptoms of reactive hypoglycemia, diabetic polys, paresthesias or visual blurring.  Last A1c was Normal & at goal:  Lab Results  Component Value Date   HGBA1C 5.3 09/07/2020           Further, the patient also has history of Vitamin D Deficiency ("32" /2009) and supplements vitamin D without any suspected side-effects. Last vitamin D was at goal:  Lab Results  Component Value Date   VD25OH 84 09/07/2020     Current Outpatient Medications on File Prior to Visit  Medication Sig  . ZEBETA 10 MG tablet Take 1/2 to 1 tablet Daily for BP  .  CLEOCIN T    1 % lotion Apply topically as needed.  . famotidine  20 MG tablet Take daily  . FLONASE  nasal spray Place 2 sprays into both nostrils daily.   Marland Kitchen ipratropium  0.06 % nasal spray  2 sprays into nostrils in the morning and bedtime.  Marland Kitchen   ZADITOR 0.025 % ophth soln Uses prn  . loratadine  10 MG tablet Take  daily.  Marland Kitchen olmesartan  20 MG tablet TAKE 1/2 TO 1 TABLET AT   . serenagen otc supplement Take 2 capsules  at bedtime.   . RABEprazole  20 MG tablet Take 1 tablet 2 times daily.  . tadalafil 5 MG tablet Take 5 mg  daily as needed for erectile dysfunction.  . tamsulosin  0.4 MG  TAKE 1 CAPSULE AT BEDTIME   . triamcinolone cream 0.1 % Apply 2  times daily as needed for itching.      Allergies  Allergen Reactions  . Allegra [Fexofenadine] Other (See Comments)    Worsened insomnia  . Benzoyl Peroxide     blisters  . Decadron [Dexamethasone]     Reflux & Hiccups, GI upset  . Doxycycline Nausea Only  . Levaquin [Levofloxacin] Nausea And Vomiting    Patient states that after he took levaquin this past time he had nausea with vomiting     PMHx:   Past Medical History:  Diagnosis Date  . Allergy   . Chronic cough   . Chronic sinusitis   . Diverticulitis 2018   Mild  . Diverticulosis of colon (without mention of hemorrhage)   .  Early satiety   . Esophageal motility disorder 12/2018  . Family history of adverse reaction to anesthesia    mom has PONV  . GERD (gastroesophageal reflux disease)   . History of left inguinal hernia   . Hypertension   . Insomnia   . Lyme disease    QUESTIONABLE  . Pulmonary nodules 10/2018   Scattered tiny subpleural pulmonary nodules at the peripheral right lung base, largest 3 mm  . Right inguinal hernia 2017   Small fat containing right inguinal hernia  . Sleep deficient   . Testosterone deficiency   . Vitamin D deficiency   . Wears glasses      Immunization History  Administered Date(s) Administered  . Influenza Inj Mdck Quad  08/29/2017, 07/30/2018, 08/14/2019, 09/07/2020  . Influenza Split 08/07/2012, 09/25/2014  . Influenza, Seasonal,  09/25/2015  . Influenza,inj,quad,  09/15/2016  . PFIZER SARS-COV-2 Vacc 04/17/2020, 05/08/2020  . PPD Test 12/01/2014, 12/08/2015,  07/11/2017, 07/30/2018, 08/14/2019, 09/07/2020  . Td 09/07/2020  . Tdap 07/08/2010     Past Surgical History:  Procedure Laterality Date  . BRAVO [place] STUDY N/A 05/02/2019   Procedure: BRAVO PH STUDY; Milus Banister, MD  . COLONOSCOPY    . ESOPHAGOGASTRODUODENOSCOPY (EGD)  N/A 05/02/2019   ESOPHAGOGASTRODUODENOSCOPY (EGD)  Milus Banister, MD;    . HERNIA REPAIR    . TOE SURGERY     INGROWN TOE NAIL, CHILDHOOD  . UPPER GASTROINTESTINAL ENDOSCOPY    . VASECTOMY      FHx:    Reviewed / unchanged  SHx:    Reviewed / unchanged   Systems Review:  Constitutional: Denies fever, chills, wt changes, headaches, insomnia, fatigue, night sweats, change in appetite. Eyes: Denies redness, blurred vision, diplopia, discharge, itchy, watery eyes.  ENT: Denies discharge, congestion, post nasal drip, epistaxis, sore throat, earache, hearing loss, dental pain, tinnitus, vertigo, sinus pain, snoring.  CV: Denies chest pain, palpitations, irregular heartbeat, syncope, dyspnea, diaphoresis, orthopnea, PND, claudication or edema. Respiratory: denies cough, dyspnea, DOE, pleurisy, hoarseness, laryngitis, wheezing.  Gastrointestinal: Denies dysphagia, odynophagia, heartburn, reflux, water brash, abdominal pain or cramps, nausea, vomiting, bloating, diarrhea, constipation, hematemesis, melena, hematochezia  or hemorrhoids. Genitourinary: Denies dysuria, frequency, urgency, nocturia, hesitancy, discharge, hematuria or flank pain. Musculoskeletal: Denies arthralgias, myalgias, stiffness, jt. swelling, pain, limping or strain/sprain.  Skin: Denies pruritus, rash, hives, warts, acne, eczema or change in skin lesion(s). Neuro: No weakness, tremor, incoordination, spasms, paresthesia or pain. Psychiatric: Denies confusion, memory loss or sensory loss. Endo: Denies change in weight, skin or hair change.  Heme/Lymph: No excessive bleeding, bruising or enlarged lymph nodes.  Physical Exam  BP 120/74   Pulse  (!) 46   Temp (!) 97 F (36.1 C)   Ht 5\' 9"  (1.753 m)   Wt 172 lb 3.2 oz (78.1 kg)   SpO2 97%   BMI 25.43 kg/m   Appears  well nourished, well groomed  and in no distress.  Eyes: PERRLA, EOMs, conjunctiva no swelling or erythema. Sinuses: No frontal/maxillary tenderness ENT/Mouth: EAC's clear, TM's nl w/o erythema, bulging. Nares clear w/o erythema, swelling, exudates. Oropharynx clear without erythema or exudates. Oral hygiene is good. Tongue normal, non obstructing. Hearing intact.  Neck: Supple. Thyroid not palpable. Car 2+/2+ without bruits, nodes or JVD. Chest: Respirations nl with BS clear & equal w/o rales, rhonchi, wheezing or stridor.  Cor: Heart sounds normal w/ regular rate and rhythm without sig. murmurs, gallops, clicks or rubs. Peripheral pulses normal and equal  without edema.  Abdomen: Soft & bowel sounds normal. Non-tender w/o guarding, rebound, hernias, masses or organomegaly.  Lymphatics: Unremarkable.  Musculoskeletal: Full ROM all peripheral extremities, joint stability, 5/5 strength and normal gait.  Skin: Warm, dry without exposed rashes, lesions or ecchymosis apparent.  Neuro: Cranial nerves intact, reflexes equal bilaterally. Sensory-motor testing grossly intact. Tendon reflexes grossly intact.  Pysch: Alert & oriented x 3.  Insight and judgement nl & appropriate. No ideations.  Assessment and Plan:  1. Essential hypertension  - Continue medication, monitor blood pressure at home.  - Continue DASH diet.  Reminder to go to the ER if any CP,  SOB, nausea, dizziness, severe HA, changes vision/speech.  - CBC with Differential/Platelet - COMPLETE METABOLIC PANEL WITH GFR - Magnesium - TSH  2. Hyperlipidemia, mixed  - Continue diet/meds, exercise,& lifestyle modifications.  - Continue monitor periodic cholesterol/liver & renal functions   - Lipid panel - TSH  3. Abnormal glucose  - Continue diet, exercise  - Lifestyle modifications.  - Monitor  appropriate labs  - Hemoglobin A1c - Insulin, random  4. Vitamin D deficiency  - Continue supplementation.  - VITAMIN D 25 Hydroxy   5. Medication management  - CBC with Differential/Platelet - COMPLETE METABOLIC PANEL WITH GFR - Magnesium - Lipid panel - TSH - Hemoglobin A1c - Insulin, random - VITAMIN D 25 Hydroxy          Discussed  regular exercise, BP monitoring, weight control to achieve/maintain BMI less than 25 and discussed med and SE's. Recommended labs to assess and monitor clinical status with further disposition pending results of labs.  I discussed the assessment and treatment plan with the patient. The patient was provided an opportunity to ask questions and all were answered. The patient agreed with the plan and demonstrated an understanding of the instructions.  I provided over 30 minutes of exam, counseling, chart review and  complex critical decision making.         The patient was advised to call back or seek an in-person evaluation if the symptoms worsen or if the condition fails to improve as anticipated.   Kirtland Bouchard, MD

## 2021-03-23 ENCOUNTER — Encounter: Payer: Self-pay | Admitting: Internal Medicine

## 2021-03-23 ENCOUNTER — Ambulatory Visit (INDEPENDENT_AMBULATORY_CARE_PROVIDER_SITE_OTHER): Payer: 59 | Admitting: Internal Medicine

## 2021-03-23 ENCOUNTER — Other Ambulatory Visit: Payer: Self-pay

## 2021-03-23 VITALS — BP 120/74 | HR 46 | Temp 97.0°F | Ht 69.0 in | Wt 172.2 lb

## 2021-03-23 DIAGNOSIS — I10 Essential (primary) hypertension: Secondary | ICD-10-CM

## 2021-03-23 DIAGNOSIS — E559 Vitamin D deficiency, unspecified: Secondary | ICD-10-CM

## 2021-03-23 DIAGNOSIS — E782 Mixed hyperlipidemia: Secondary | ICD-10-CM | POA: Diagnosis not present

## 2021-03-23 DIAGNOSIS — R7309 Other abnormal glucose: Secondary | ICD-10-CM | POA: Diagnosis not present

## 2021-03-23 DIAGNOSIS — J302 Other seasonal allergic rhinitis: Secondary | ICD-10-CM

## 2021-03-23 DIAGNOSIS — Z79899 Other long term (current) drug therapy: Secondary | ICD-10-CM

## 2021-03-23 MED ORDER — MONTELUKAST SODIUM 10 MG PO TABS: ORAL_TABLET | ORAL | 3 refills | Status: DC

## 2021-03-23 MED ORDER — PREDNISONE 20 MG PO TABS: ORAL_TABLET | ORAL | 0 refills | Status: DC

## 2021-03-24 LAB — COMPLETE METABOLIC PANEL WITH GFR
AG Ratio: 1.8 (calc) (ref 1.0–2.5)
ALT: 16 U/L (ref 9–46)
AST: 21 U/L (ref 10–35)
Albumin: 4.2 g/dL (ref 3.6–5.1)
Alkaline phosphatase (APISO): 49 U/L (ref 35–144)
BUN: 18 mg/dL (ref 7–25)
CO2: 27 mmol/L (ref 20–32)
Calcium: 9.2 mg/dL (ref 8.6–10.3)
Chloride: 105 mmol/L (ref 98–110)
Creat: 1.01 mg/dL (ref 0.70–1.25)
GFR, Est African American: 92 mL/min/{1.73_m2} (ref >=60)
GFR, Est Non African American: 79 mL/min/{1.73_m2} (ref >=60)
Globulin: 2.4 g/dL (calc) (ref 1.9–3.7)
Glucose, Bld: 87 mg/dL (ref 65–99)
Potassium: 4 mmol/L (ref 3.5–5.3)
Sodium: 139 mmol/L (ref 135–146)
Total Bilirubin: 0.5 mg/dL (ref 0.2–1.2)
Total Protein: 6.6 g/dL (ref 6.1–8.1)

## 2021-03-24 LAB — LIPID PANEL
Cholesterol: 174 mg/dL (ref <200)
HDL: 33 mg/dL — ABNORMAL LOW (ref >=40)
LDL Cholesterol (Calc): 105 mg/dL (calc) — ABNORMAL HIGH
Non-HDL Cholesterol (Calc): 141 mg/dL (calc) — ABNORMAL HIGH (ref <130)
Total CHOL/HDL Ratio: 5.3 (calc) — ABNORMAL HIGH (ref <5.0)
Triglycerides: 250 mg/dL — ABNORMAL HIGH (ref <150)

## 2021-03-24 LAB — CBC WITH DIFFERENTIAL/PLATELET
Absolute Monocytes: 666 cells/uL (ref 200–950)
Basophils Absolute: 18 cells/uL (ref 0–200)
Basophils Relative: 0.4 %
Eosinophils Absolute: 81 cells/uL (ref 15–500)
Eosinophils Relative: 1.8 %
HCT: 38.5 % (ref 38.5–50.0)
Hemoglobin: 13 g/dL — ABNORMAL LOW (ref 13.2–17.1)
Lymphs Abs: 1310 cells/uL (ref 850–3900)
MCH: 31.9 pg (ref 27.0–33.0)
MCHC: 33.8 g/dL (ref 32.0–36.0)
MCV: 94.4 fL (ref 80.0–100.0)
MPV: 9.6 fL (ref 7.5–12.5)
Monocytes Relative: 14.8 %
Neutro Abs: 2426 cells/uL (ref 1500–7800)
Neutrophils Relative %: 53.9 %
Platelets: 151 10*3/uL (ref 140–400)
RBC: 4.08 10*6/uL — ABNORMAL LOW (ref 4.20–5.80)
RDW: 12 % (ref 11.0–15.0)
Total Lymphocyte: 29.1 %
WBC: 4.5 10*3/uL (ref 3.8–10.8)

## 2021-03-24 LAB — INSULIN, RANDOM: Insulin: 5.3 u[IU]/mL

## 2021-03-24 LAB — MAGNESIUM: Magnesium: 2.2 mg/dL (ref 1.5–2.5)

## 2021-03-24 LAB — VITAMIN D 25 HYDROXY (VIT D DEFICIENCY, FRACTURES): Vit D, 25-Hydroxy: 92 ng/mL (ref 30–100)

## 2021-03-24 LAB — TSH: TSH: 3.58 mIU/L (ref 0.40–4.50)

## 2021-03-24 LAB — HEMOGLOBIN A1C
Hgb A1c MFr Bld: 5.1 % of total Hgb (ref <5.7)
Mean Plasma Glucose: 100 mg/dL
eAG (mmol/L): 5.5 mmol/L

## 2021-03-24 NOTE — Progress Notes (Signed)
============================================================ -   Test results slightly outside the reference range are not unusual. If there is anything important, I will review this with you,  otherwise it is considered normal test values.  If you have further questions,  please do not hesitate to contact me at the office or via My Chart.  ============================================================ ============================================================  -  Total Chol = 174 -  Excellent   - Very low risk for Heart Attack  / Stroke ========================================================  - LDL Chol = 105 - Borderline elevated , So -    - Recommend stricter low cholesterol diet   - Cholesterol only comes from animal sources  - ie. meat, dairy, egg yolks  - Eat all the vegetables you want.  - Avoid meat, especially red meat - Beef AND Pork .  - Avoid cheese & dairy - milk & ice cream.     - Cheese is the most concentrated form of trans-fats which  is the worst thing to clog up our arteries.   - Veggie cheese is OK which can be found in the fresh  produce section at [employer] or [employer] or [employer] ============================================================ ============================================================  -  Triglycerides (  250   ) or fats in blood are too high  (goal is less than 150)    - Recommend avoid fried & greasy foods,  sweets / candy,   - Avoid white rice  (brown or wild rice or Quinoa is OK),   - Avoid white potatoes  (sweet potatoes are OK)   - Avoid anything made from white flour  - bagels, doughnuts, rolls, buns, biscuits, white and   wheat breads, pizza crust and traditional  pasta made of white flour & egg white  - (vegetarian pasta or spinach or wheat pasta is OK).    - Multi-grain bread is OK - like multi-grain flat bread or  sandwich thins.   - Avoid alcohol in excess.   - Exercise is also  important. ============================================================ ============================================================  -  A1c - Normal - Great - No Diabetes - Great ! ============================================================ ============================================================  -  Vitamin D = 92 - Excellent  ============================================================ ============================================================  -  All Else - CBC - Kidneys - Electrolytes - Liver - Magnesium & Thyroid    - all  Normal / OK ============================================================ ============================================================

## 2021-04-12 NOTE — Progress Notes (Signed)
Surgical Instructions    Your procedure is scheduled on 04/21/21.  Report to [facility] Main Entrance "A" at 07:30 A.M., then check in with the Admitting office.  Call this number if you have problems the morning of surgery:  [phone]   If you have any questions prior to your surgery date call [phone]: Open Monday-Friday 8am-4pm    Remember:  Do not eat or drink after midnight the night before your surgery    Take these medicines the morning of surgery with A SIP OF WATER  bisoprolol (ZEBETA)  famotidine (PEPCID) fluticasone (FLONASE)  loratadine (CLARITIN) montelukast (SINGULAIR)  Olopatadine HCl eye drops RABEprazole (ACIPHEX)    As of today, STOP taking any Aspirin (unless otherwise instructed by your surgeon) Aleve, Naproxen, Ibuprofen, Motrin, Advil, Goody's, BC's, all herbal medications, fish oil, and all vitamins.          Do not wear jewelry or makeup Do not wear lotions, powders, colognes, or deodorant.   Men may shave face and neck. Do not bring valuables to the hospital. DO Not wear nail polish, gel polish, artificial nails, or any other type of covering on natural nails including finger and toenails. If patients have artificial nails, gel coating, etc. that need to be removed by a nail salon please have this removed prior to surgery or surgery may need to be canceled/delayed if the surgeon/ anesthesia feels like the patient is unable to be adequately monitored.             [facility] is not responsible for any belongings or valuables.  Do NOT Smoke (Tobacco/Vaping) or drink Alcohol 24 hours prior to your procedure If you use a CPAP at night, you may bring all equipment for your overnight stay.   Contacts, glasses, dentures or bridgework may not be worn into surgery, please bring cases for these belongings   For patients admitted to the hospital, discharge time will be determined by your treatment team.   Patients discharged the day of surgery will not  be allowed to drive home, and someone needs to stay with them for 24 hours.    Special instructions:    Oral Hygiene is also important to reduce your risk of infection.  Remember - BRUSH YOUR TEETH THE MORNING OF SURGERY WITH YOUR REGULAR TOOTHPASTE   [facility]- Preparing For Surgery  Before surgery, you can play an important role. Because skin is not sterile, your skin needs to be as free of germs as possible. You can reduce the number of germs on your skin by washing with CHG (chlorahexidine gluconate) Soap before surgery.  CHG is an antiseptic cleaner which kills germs and bonds with the skin to continue killing germs even after washing.     Please do not use if you have an allergy to CHG or antibacterial soaps. If your skin becomes reddened/irritated stop using the CHG.  Do not shave (including legs and underarms) for at least 48 hours prior to first CHG shower. It is OK to shave your face.  Please follow these instructions carefully.    1.  Shower the NIGHT BEFORE SURGERY and the MORNING OF SURGERY with CHG Soap.   If you chose to wash your hair, wash your hair first as usual with your normal shampoo. After you shampoo, rinse your hair and body thoroughly to remove the shampoo.  Then [employer] and genitals (private parts) with your normal soap and rinse thoroughly to remove soap.  2. After that Use CHG Soap  as you would any other liquid soap. You can apply CHG directly to the skin and wash gently with a scrungie or a clean washcloth.   3. Apply the CHG Soap to your body ONLY FROM THE NECK DOWN.  Do not use on open wounds or open sores. Avoid contact with your eyes, ears, mouth and genitals (private parts). Wash Face and genitals (private parts)  with your normal soap.   4. Wash thoroughly, paying special attention to the area where your surgery will be performed.  5. Thoroughly rinse your body with warm water from the neck down.  6. DO NOT shower/wash with your normal soap after  using and rinsing off the CHG Soap.  7. Pat yourself dry with a CLEAN TOWEL.  8. Wear CLEAN PAJAMAS to bed the night before surgery  9. Place CLEAN SHEETS on your bed the night before your surgery  10. DO NOT SLEEP WITH PETS.   Day of Surgery: Take a shower with CHG soap. Wear Clean/Comfortable clothing the morning of surgery Do not apply any deodorants/lotions.   Remember to brush your teeth WITH YOUR REGULAR TOOTHPASTE.   Please read over the following fact sheets that you were given.

## 2021-04-13 ENCOUNTER — Encounter (HOSPITAL_COMMUNITY): Payer: Self-pay

## 2021-04-13 ENCOUNTER — Other Ambulatory Visit: Payer: Self-pay

## 2021-04-13 ENCOUNTER — Encounter (HOSPITAL_COMMUNITY)
Admission: RE | Admit: 2021-04-13 | Discharge: 2021-04-13 | Disposition: A | Discharge status: Home / Self Care | Payer: 59 | Source: Ambulatory Visit | Attending: Otolaryngology | Admitting: Otolaryngology

## 2021-04-13 HISTORY — DX: Personal history of urinary calculi: Z87.442

## 2021-04-13 HISTORY — DX: Sleep apnea, unspecified: G47.30

## 2021-04-13 NOTE — Progress Notes (Signed)
PCP - Dr. Unk Pinto Cardiologist - Dr. Bartholome Bill  PPM/ICD - n/a Device Orders - n/a Rep Notified - n/a  Chest x-ray - n/a EKG - 09/07/20 Stress Test - denies ECHO - denies Cardiac Cath - denies  Sleep Study - 2021 CPAP - Patient states he does not use due to intolerance. Patient states he will follow up with Dr. Brett Fairy on using [facility] after septoplasty/turbinate reduction.  Fasting Blood Sugar - n/a  Blood Thinner Instructions: n/a Aspirin Instructions: n/a  ERAS Protcol - n/a  COVID TEST- Scheduled for Monday 6/13 at [facility]. Patient is aware of date, time, and location.    Anesthesia review: Yes  Patient denies shortness of breath, fever, cough and chest pain at PAT appointment   All instructions explained to the patient, with a verbal understanding of the material. Patient agrees to go over the instructions while at home for a better understanding. Patient also instructed to self quarantine after being tested for COVID-19. The opportunity to ask questions was provided.

## 2021-04-14 NOTE — Progress Notes (Addendum)
Anesthesia Chart Review:  Case: [id number] Date/Time: 04/21/21 0915   Procedure: NASAL SEPTOPLASTY WITH  BILATERAL TURBINATE REDUCTION (Bilateral )   Anesthesia type: General   Pre-op diagnosis: DEVIATED SEPTUM,BILATERAL TURBINATE HYPERTROPHY   Location: [facility] OR ROOM 06 / [place] OR   Surgeons: Leta Baptist, MD       DISCUSSION: Patient is a 60 year old male scheduled for the above procedure.  History includes never smoker, HTN, GERD, chronic sinusitis, chronic cough, OSA (AHI 19.8/h 07/15/20; intolerant to CPAP), insomnia, possible lyme disease, pulmonary nodules (scattered tiny subpleural nodules right lung base, largest 3 mm, no follow-up if considered low risk, 12 month follow-up if considered high risk), esophageal motility disorder,   Last visit with PCP Dr. Melford Aase on 03/23/21. He notes patient is scheduled for nasal septoplasty on 04/21/21 by Dr. Benjamine Mola. BP then 120/74, HR 46. He had "no complaints of any cardiac type chest pain, palpitations, dyspnea / orthopnea / PND, dizziness, claudication, or dependent edema." HLD felt well controlled. A1c 5.3%.  He was referred to [facility] in late 2020 for dizziness, lightheadedness and feeling of "head fullness". Symptoms may have started after starting atenolol and other BP medications. Head fullness improved after atenolol discontinued, but continued to have intermittent dizziness. He had further improvement after stopping olmesartan on his own, but then BP elevated at 170/100. A 72 hour Holter monitor was done revealing predominantly SR with rare PACs. He was started on amlodpine, continued on HCTZ, and referred to neurology. Follow-up BP 130/80's. MRA of the head and carotid US were unremarkable. He was treated by neurology for migraines and PM&R for cervical radiculitis. At last follow-up with Dr. Ubaldo Glassing echo and nuclear stress test discussed for atypical chest symptoms, but these were never scheduled. He has had routine primary care follow since and denied  cardiac symptoms. Discussed with anesthesiologist Nolon Nations, MD. Cardiology notes indicate he had atypical symptoms about 9 months ago, but primary care notes do not suggest any concerning cardiac symptoms since. Will attempt to get in touch with Dr. Melford Aase to inquire if he has any additonal preoperative recommendations. (UPDATE: 04/15/21 9:49 AM: I received communication from PCP Unk Pinto, MD. Mr. Phineas Douglas not felt to be high surgery risk per Dr. Melford Aase, and he does not think Mr. Gasper requires a preoperative cardiology evaluation as he has not had any concerning CV symptoms.)    Preoperative COVID-19 test is scheduled for 04/19/21.     VS: BP 123/78   Pulse (!) 50   Temp 36.4 C (Oral)   Resp 17   Ht 5\' 9"  (1.753 m)   Wt 77.1 kg   SpO2 100%   BMI 25.09 kg/m   PROVIDERS: Unk Pinto, MD is PCP  Bartholome Bill, MD is cardiologist Gurney Maxin, MD is neurologist Dohmeier, Asencion Partridge, MD is neurologist ([facility])   LABS: Last labs 03/23/21 with results including: Lab Results  Component Value Date   WBC 4.5 03/23/2021   HGB 13.0 (L) 03/23/2021   HCT 38.5 03/23/2021   PLT 151 03/23/2021   GLUCOSE 87 03/23/2021   ALT 16 03/23/2021   AST 21 03/23/2021   NA 139 03/23/2021   K 4.0 03/23/2021   CL 105 03/23/2021   CREATININE 1.01 03/23/2021   BUN 18 03/23/2021   CO2 27 03/23/2021   TSH 3.58 03/23/2021   HGBA1C 5.1 03/23/2021    PFTs 07/10/19: FVC 3.97 (86%), post 4.04 (87%), FEV1 3.29 (94%), post 3.39 (97%), DLCO unc 29.64 (110%).   IMAGES: MRA Head 05/2020 (  [facility]):  Impression: Normal  CT Maxillofacial 07/24/19: IMPRESSION: 1. Minimal scattered paranasal sinus mucosal thickening. No fluid. 2. Leftward nasal septal deviation.   EKG: 09/07/20: Sinus bradycardia at 51 bpm.  ST junctional depression is nonspecific.   CV: Carotid US 10/30/19 (report in [facility]/PACS): IMPRESSION: Minimal amount of bilateral intimal thickening/atherosclerotic plaque, left  greater than right, not resulting in a hemodynamically significant stenosis within either internal carotid artery.   Holter Monitor 09/27/19-09/29/19 ([facility]): Indication: Dizziness and giddiness Hours recorded: 72-hour Summary: 1. There were 150898 QRS complexes detected. Of these, there were 38(<1%) supraventricular ectopic beats. The average heart rate was 62 bpm. The heart rate ranged from 43 bpm to 125 bpm. The R-R ranged from 240 msec to 1840 msec. There were 4 Sustained Tachycardia Episodes with total duration 23.1(<1%) minutes. The fastest tachy was 108 bpm. The longest tachy was 15 minutes. There were 372 Sustained Bradycardia Episodes with total duration 1453.0(58.2%) minutes. The slowest brady was 46 bpm. The longest brady was 41 minutes.  - Preliminary Technician Interpretation: Sinus bradycardia to sinus tachycardia with sinus arrhythmia. Diary events entered.  - Holter revealed predominant rhythm was nsr at average rate of 62. There were rare pacs. No pauses. No runs of svt or ventricular rhythm There were episodes of sinus tachycardia, fastest of which was 108 bpm. Pt events associated with nsr.     Past Medical History:  Diagnosis Date   Allergy    Chronic cough    Chronic sinusitis    Diverticulitis 2018   Mild   Diverticulosis of colon (without mention of hemorrhage)    Early satiety    Esophageal motility disorder 12/2018   Family history of adverse reaction to anesthesia    mom has PONV   GERD (gastroesophageal reflux disease)    History of kidney stones    2017   History of left inguinal hernia    Hypertension    Insomnia    Lyme disease    QUESTIONABLE   Pulmonary nodules 10/2018   Scattered tiny subpleural pulmonary nodules at the peripheral right lung base, largest 3 mm   Right inguinal hernia 2017   Small fat containing right inguinal hernia   Sleep apnea    Sleep deficient    Testosterone deficiency    Vitamin D deficiency    Wears glasses      Past Surgical History:  Procedure Laterality Date   BRAVO [place] STUDY N/A 05/02/2019   Procedure: BRAVO [place] STUDY;  Surgeon: Milus Banister, MD;  Location: [facility];  Service: Endoscopy;  Laterality: N/A;   COLONOSCOPY     ESOPHAGOGASTRODUODENOSCOPY (EGD) WITH PROPOFOL N/A 05/02/2019   Procedure: ESOPHAGOGASTRODUODENOSCOPY (EGD) WITH PROPOFOL;  Surgeon: Milus Banister, MD;  Location: [facility];  Service: Endoscopy;  Laterality: N/A;   HERNIA REPAIR     TOE SURGERY     INGROWN TOE NAIL, CHILDHOOD   UPPER GASTROINTESTINAL ENDOSCOPY     VASECTOMY      MEDICATIONS:  bisoprolol (ZEBETA) 10 MG tablet   cholecalciferol (VITAMIN D) 25 MCG (1000 UNIT) tablet   clindamycin (CLEOCIN T) 1 % lotion   docusate sodium (COLACE) 100 MG capsule   famotidine (PEPCID) 20 MG tablet   fluticasone (FLONASE) 50 MCG/ACT nasal spray   hydrocortisone cream 1 %   ketotifen (ZADITOR) 0.025 % ophthalmic solution   loratadine (CLARITIN) 10 MG tablet   montelukast (SINGULAIR) 10 MG tablet   olmesartan (BENICAR) 20 MG tablet   Olopatadine HCl 0.2 %  SOLN   Omega-3 1000 MG CAPS   OVER THE COUNTER MEDICATION   predniSONE (DELTASONE) 20 MG tablet   psyllium (REGULOID) 0.52 g capsule   RABEprazole (ACIPHEX) 20 MG tablet   tadalafil (CIALIS) 5 MG tablet   tamsulosin (FLOMAX) 0.4 MG CAPS capsule   triamcinolone cream (KENALOG) 0.1 %   No current facility-administered medications for this encounter.  Not currently taking Zaditor or prednisone taper.   Myra Gianotti, PA-C Surgical Short Stay/Anesthesiology [facility] Phone [phone] [facility] Phone [phone] 04/14/2021 6:13 PM

## 2021-04-15 NOTE — Anesthesia Preprocedure Evaluation (Addendum)
Anesthesia Evaluation  Patient identified by MRN, date of birth, ID band Patient awake    Reviewed: Allergy & Precautions, NPO status , Patient's Chart, lab work & pertinent test results, reviewed documented beta blocker date and time   History of Anesthesia Complications Negative for: history of anesthetic complications  Airway Mallampati: II  TM Distance: >3 FB Neck ROM: Full    Dental  (+) Teeth Intact, Dental Advisory Given   Pulmonary sleep apnea ,    Pulmonary exam normal        Cardiovascular hypertension, Pt. on medications and Pt. on home beta blockers Normal cardiovascular exam     Neuro/Psych negative neurological ROS     GI/Hepatic Neg liver ROS, GERD  ,  Endo/Other  negative endocrine ROS  Renal/GU negative Renal ROS  negative genitourinary   Musculoskeletal negative musculoskeletal ROS (+)   Abdominal   Peds  Hematology negative hematology ROS (+)   Anesthesia Other Findings   Reproductive/Obstetrics                           Anesthesia Physical Anesthesia Plan  ASA: 2  Anesthesia Plan: General   Post-op Pain Management:    Induction: Intravenous  PONV Risk Score and Plan: Ondansetron, Dexamethasone, Treatment may vary due to age or medical condition and Midazolam  Airway Management Planned: Oral ETT  Additional Equipment: None  Intra-op Plan:   Post-operative Plan: Extubation in OR  Informed Consent: I have reviewed the patients History and Physical, chart, labs and discussed the procedure including the risks, benefits and alternatives for the proposed anesthesia with the patient or authorized representative who has indicated his/her understanding and acceptance.     Dental advisory given  Plan Discussed with:   Anesthesia Plan Comments: (PAT note written by Myra Gianotti, PA-C. )     Anesthesia Quick Evaluation

## 2021-04-19 ENCOUNTER — Other Ambulatory Visit
Admission: RE | Admit: 2021-04-19 | Discharge: 2021-04-19 | Disposition: A | Discharge status: Home / Self Care | Payer: 59 | Source: Ambulatory Visit | Attending: Otolaryngology | Admitting: Otolaryngology

## 2021-04-19 ENCOUNTER — Other Ambulatory Visit: Payer: Self-pay

## 2021-04-19 DIAGNOSIS — Z20822 Contact with and (suspected) exposure to covid-19: Secondary | ICD-10-CM | POA: Diagnosis not present

## 2021-04-19 DIAGNOSIS — Z01812 Encounter for preprocedural laboratory examination: Secondary | ICD-10-CM | POA: Diagnosis not present

## 2021-04-19 LAB — SARS CORONAVIRUS 2 (TAT 6-24 HRS): SARS Coronavirus 2: NEGATIVE

## 2021-04-21 ENCOUNTER — Ambulatory Visit (HOSPITAL_COMMUNITY)
Admission: RE | Admit: 2021-04-21 | Discharge: 2021-04-21 | Disposition: A | Discharge status: Home / Self Care | Payer: 59 | Attending: Otolaryngology | Admitting: Otolaryngology

## 2021-04-21 ENCOUNTER — Encounter (HOSPITAL_COMMUNITY): Payer: Self-pay | Admitting: Otolaryngology

## 2021-04-21 ENCOUNTER — Ambulatory Visit (HOSPITAL_COMMUNITY): Payer: 59 | Admitting: Vascular Surgery

## 2021-04-21 ENCOUNTER — Other Ambulatory Visit: Payer: Self-pay

## 2021-04-21 ENCOUNTER — Ambulatory Visit (HOSPITAL_COMMUNITY): Payer: 59 | Admitting: Anesthesiology

## 2021-04-21 ENCOUNTER — Encounter (HOSPITAL_COMMUNITY)
Admission: RE | Disposition: A | Discharge status: Home / Self Care | Payer: Self-pay | Source: Home / Self Care | Attending: Otolaryngology

## 2021-04-21 DIAGNOSIS — J3489 Other specified disorders of nose and nasal sinuses: Secondary | ICD-10-CM | POA: Diagnosis not present

## 2021-04-21 DIAGNOSIS — J342 Deviated nasal septum: Secondary | ICD-10-CM | POA: Diagnosis not present

## 2021-04-21 DIAGNOSIS — H903 Sensorineural hearing loss, bilateral: Secondary | ICD-10-CM | POA: Insufficient documentation

## 2021-04-21 DIAGNOSIS — J343 Hypertrophy of nasal turbinates: Secondary | ICD-10-CM | POA: Diagnosis not present

## 2021-04-21 HISTORY — PX: NASAL SEPTOPLASTY W/ TURBINOPLASTY: SHX2070

## 2021-04-21 SURGERY — SEPTOPLASTY, NOSE, WITH NASAL TURBINATE REDUCTION
Anesthesia: General | Site: Nose | Laterality: Bilateral

## 2021-04-21 MED ORDER — 0.9 % SODIUM CHLORIDE (POUR BTL) OPTIME
TOPICAL | Status: DC | PRN
  Administered 2021-04-21: 1000 mL

## 2021-04-21 MED ORDER — ONDANSETRON HCL 4 MG/2ML IJ SOLN
INTRAMUSCULAR | Status: DC | PRN
  Administered 2021-04-21: 4 mg via INTRAVENOUS

## 2021-04-21 MED ORDER — FENTANYL CITRATE (PF) 100 MCG/2ML IJ SOLN: 25.0000 ug | INTRAMUSCULAR | Status: DC | PRN

## 2021-04-21 MED ORDER — OXYMETAZOLINE HCL 0.05 % NA SOLN
NASAL | Status: DC | PRN
  Administered 2021-04-21: 1 via TOPICAL

## 2021-04-21 MED ORDER — CHLORHEXIDINE GLUCONATE 0.12 % MT SOLN
15.0000 mL | Freq: Once | OROMUCOSAL | Status: AC
  Administered 2021-04-21: 15 mL via OROMUCOSAL
  Filled 2021-04-21: qty 15

## 2021-04-21 MED ORDER — ROCURONIUM BROMIDE 10 MG/ML (PF) SYRINGE
PREFILLED_SYRINGE | INTRAVENOUS | Status: DC | PRN
  Administered 2021-04-21: 70 mg via INTRAVENOUS

## 2021-04-21 MED ORDER — OXYCODONE-ACETAMINOPHEN 5-325 MG PO TABS: 1.0000 | ORAL_TABLET | ORAL | 0 refills | Status: AC | PRN

## 2021-04-21 MED ORDER — FENTANYL CITRATE (PF) 250 MCG/5ML IJ SOLN
INTRAMUSCULAR | Status: DC | PRN
  Administered 2021-04-21: 100 ug via INTRAVENOUS
  Administered 2021-04-21: 50 ug via INTRAVENOUS

## 2021-04-21 MED ORDER — OXYCODONE HCL 5 MG PO TABS
5.0000 mg | ORAL_TABLET | Freq: Once | ORAL | Status: AC | PRN
  Administered 2021-04-21: 5 mg via ORAL

## 2021-04-21 MED ORDER — PROMETHAZINE HCL 25 MG/ML IJ SOLN
INTRAMUSCULAR | Status: AC
  Administered 2021-04-21: 6.25 mg via INTRAVENOUS
  Filled 2021-04-21: qty 1

## 2021-04-21 MED ORDER — DEXAMETHASONE SODIUM PHOSPHATE 10 MG/ML IJ SOLN
INTRAMUSCULAR | Status: DC | PRN
  Administered 2021-04-21: 10 mg via INTRAVENOUS

## 2021-04-21 MED ORDER — PROPOFOL 10 MG/ML IV BOLUS
INTRAVENOUS | Status: DC | PRN
  Administered 2021-04-21: 200 mg via INTRAVENOUS

## 2021-04-21 MED ORDER — AMISULPRIDE (ANTIEMETIC) 5 MG/2ML IV SOLN: 10.0000 mg | Freq: Once | INTRAVENOUS | Status: DC | PRN

## 2021-04-21 MED ORDER — MIDAZOLAM HCL 5 MG/5ML IJ SOLN
INTRAMUSCULAR | Status: DC | PRN
  Administered 2021-04-21: 2 mg via INTRAVENOUS

## 2021-04-21 MED ORDER — OXYCODONE HCL 5 MG PO TABS
ORAL_TABLET | ORAL | Status: AC
  Filled 2021-04-21: qty 1

## 2021-04-21 MED ORDER — CEFAZOLIN SODIUM-DEXTROSE 2-3 GM-%(50ML) IV SOLR
INTRAVENOUS | Status: DC | PRN
  Administered 2021-04-21: 2 g via INTRAVENOUS

## 2021-04-21 MED ORDER — AMISULPRIDE (ANTIEMETIC) 5 MG/2ML IV SOLN
INTRAVENOUS | Status: AC
  Filled 2021-04-21: qty 4

## 2021-04-21 MED ORDER — LIDOCAINE-EPINEPHRINE 1 %-1:100000 IJ SOLN
INTRAMUSCULAR | Status: AC
  Filled 2021-04-21: qty 1

## 2021-04-21 MED ORDER — ORAL CARE MOUTH RINSE: 15.0000 mL | Freq: Once | OROMUCOSAL | Status: AC

## 2021-04-21 MED ORDER — LIDOCAINE 2% (20 MG/ML) 5 ML SYRINGE
INTRAMUSCULAR | Status: DC | PRN
  Administered 2021-04-21: 100 mg via INTRAVENOUS

## 2021-04-21 MED ORDER — ONDANSETRON HCL 4 MG/2ML IJ SOLN: 4.0000 mg | Freq: Once | INTRAMUSCULAR | Status: DC | PRN

## 2021-04-21 MED ORDER — AMOXICILLIN 875 MG PO TABS: 875.0000 mg | ORAL_TABLET | Freq: Two times a day (BID) | ORAL | 0 refills | Status: AC

## 2021-04-21 MED ORDER — AMISULPRIDE (ANTIEMETIC) 5 MG/2ML IV SOLN
10.0000 mg | Freq: Once | INTRAVENOUS | Status: AC
  Administered 2021-04-21: 10 mg via INTRAVENOUS

## 2021-04-21 MED ORDER — OXYCODONE HCL 5 MG/5ML PO SOLN: 5.0000 mg | Freq: Once | ORAL | Status: AC | PRN

## 2021-04-21 MED ORDER — EPHEDRINE SULFATE 50 MG/ML IJ SOLN
INTRAMUSCULAR | Status: DC | PRN
  Administered 2021-04-21 (×2): 10 mg via INTRAVENOUS

## 2021-04-21 MED ORDER — LACTATED RINGERS IV SOLN: INTRAVENOUS | Status: DC

## 2021-04-21 MED ORDER — BACITRACIN ZINC 500 UNIT/GM EX OINT
TOPICAL_OINTMENT | CUTANEOUS | Status: AC
  Filled 2021-04-21: qty 28.35

## 2021-04-21 MED ORDER — MIDAZOLAM HCL 2 MG/2ML IJ SOLN
INTRAMUSCULAR | Status: AC
  Filled 2021-04-21: qty 2

## 2021-04-21 MED ORDER — BACITRACIN ZINC 500 UNIT/GM EX OINT
TOPICAL_OINTMENT | CUTANEOUS | Status: DC | PRN
  Administered 2021-04-21: 1 via TOPICAL

## 2021-04-21 MED ORDER — PROPOFOL 10 MG/ML IV BOLUS
INTRAVENOUS | Status: AC
  Filled 2021-04-21: qty 20

## 2021-04-21 MED ORDER — LIDOCAINE-EPINEPHRINE 1 %-1:100000 IJ SOLN
INTRAMUSCULAR | Status: DC | PRN
  Administered 2021-04-21: 3 mL

## 2021-04-21 MED ORDER — PHENYLEPHRINE 40 MCG/ML (10ML) SYRINGE FOR IV PUSH (FOR BLOOD PRESSURE SUPPORT)
PREFILLED_SYRINGE | INTRAVENOUS | Status: DC | PRN
  Administered 2021-04-21: 200 ug via INTRAVENOUS
  Administered 2021-04-21: 120 ug via INTRAVENOUS

## 2021-04-21 MED ORDER — PROMETHAZINE HCL 25 MG/ML IJ SOLN: 6.2500 mg | Freq: Once | INTRAMUSCULAR | Status: AC

## 2021-04-21 MED ORDER — FENTANYL CITRATE (PF) 250 MCG/5ML IJ SOLN
INTRAMUSCULAR | Status: AC
  Filled 2021-04-21: qty 5

## 2021-04-21 MED ORDER — PHENYLEPHRINE HCL-NACL 10-0.9 MG/250ML-% IV SOLN
INTRAVENOUS | Status: DC | PRN
  Administered 2021-04-21: 40 ug/min via INTRAVENOUS

## 2021-04-21 MED ORDER — SUGAMMADEX SODIUM 200 MG/2ML IV SOLN
INTRAVENOUS | Status: DC | PRN
  Administered 2021-04-21: 200 mg via INTRAVENOUS

## 2021-04-21 SURGICAL SUPPLY — 27 items
CANISTER SUCT 3000ML PPV (MISCELLANEOUS) ×3 IMPLANT
COAGULATOR SUCT 6 FR SWTCH (ELECTROSURGICAL) ×1
COAGULATOR SUCT SWTCH 10FR 6 (ELECTROSURGICAL) ×2 IMPLANT
COVER SURGICAL LIGHT HANDLE (MISCELLANEOUS) IMPLANT
COVER WAND RF STERILE (DRAPES) ×3 IMPLANT
DRAPE HALF SHEET 40X57 (DRAPES) IMPLANT
ELECT REM PT RETURN 9FT ADLT (ELECTROSURGICAL) ×3
ELECTRODE REM PT RTRN 9FT ADLT (ELECTROSURGICAL) ×1 IMPLANT
GAUZE SPONGE 2X2 8PLY STRL LF (GAUZE/BANDAGES/DRESSINGS) ×1 IMPLANT
GLOVE ECLIPSE 7.5 STRL STRAW (GLOVE) ×3 IMPLANT
GOWN STRL REUS W/ TWL LRG LVL3 (GOWN DISPOSABLE) ×2 IMPLANT
GOWN STRL REUS W/TWL LRG LVL3 (GOWN DISPOSABLE) ×6
KIT BASIN OR (CUSTOM PROCEDURE TRAY) ×3 IMPLANT
KIT TURNOVER KIT B (KITS) ×3 IMPLANT
NDL HYPO 25GX1X1/2 BEV (NEEDLE) ×1 IMPLANT
NEEDLE HYPO 25GX1X1/2 BEV (NEEDLE) ×3 IMPLANT
NS IRRIG 1000ML POUR BTL (IV SOLUTION) ×3 IMPLANT
PAD ARMBOARD 7.5X6 YLW CONV (MISCELLANEOUS) ×6 IMPLANT
SPLINT NASAL DOYLE BI-VL (GAUZE/BANDAGES/DRESSINGS) ×3 IMPLANT
SPONGE GAUZE 2X2 STER 10/PKG (GAUZE/BANDAGES/DRESSINGS) ×2
SPONGE NEURO XRAY DETECT 1X3 (DISPOSABLE) ×3 IMPLANT
SUT CHROMIC 4 0 SH 27 (SUTURE) ×3 IMPLANT
SUT PLAIN 4 0 ~~LOC~~ 1 (SUTURE) ×3 IMPLANT
SUT PROLENE 3 0 PS 2 (SUTURE) ×3 IMPLANT
TRAY ENT MC OR (CUSTOM PROCEDURE TRAY) ×3 IMPLANT
TUBE SALEM SUMP 16 FR W/ARV (TUBING) IMPLANT
TUBING EXTENTION W/L.L. (IV SETS) IMPLANT

## 2021-04-21 NOTE — Anesthesia Procedure Notes (Signed)
Procedure Name: Intubation Date/Time: 04/21/2021 10:48 AM Performed by: Imagene Riches, CRNA Pre-anesthesia Checklist: Patient identified, Emergency Drugs available, Suction available and Patient being monitored Patient Re-evaluated:Patient Re-evaluated prior to induction Oxygen Delivery Method: Circle System Utilized Preoxygenation: Pre-oxygenation with 100% oxygen Induction Type: IV induction Ventilation: Mask ventilation without difficulty and Oral airway inserted - appropriate to patient size Laryngoscope Size: Sabra Heck and 2 Grade View: Grade I Tube type: Oral Tube size: 7.5 mm Number of attempts: 1 Airway Equipment and Method: Stylet and Oral airway Placement Confirmation: ETT inserted through vocal cords under direct vision, positive ETCO2 and breath sounds checked- equal and bilateral Secured at: 21 cm Tube secured with: Tape Dental Injury: Teeth and Oropharynx as per pre-operative assessment

## 2021-04-21 NOTE — Anesthesia Postprocedure Evaluation (Signed)
Anesthesia Post Note  Patient: Jay Brooks  Procedure(s) Performed: NASAL SEPTOPLASTY WITH  BILATERAL TURBINATE REDUCTION (Bilateral: Nose)     Patient location during evaluation: PACU Level of consciousness: awake and alert Pain management: pain level controlled Vital Signs Assessment: post-procedure vital signs reviewed and stable Respiratory status: spontaneous breathing, nonlabored ventilation and respiratory function stable Cardiovascular status: blood pressure returned to baseline and stable Postop Assessment: no apparent nausea or vomiting Anesthetic complications: no   No notable events documented.  Last Vitals:  Vitals:   04/21/21 1215 04/21/21 1230  BP: (!) 153/90   Pulse: 64 71  Resp: 12 (!) 22  Temp:  (!) 36.2 C  SpO2: 96% 97%    Last Pain:  Vitals:   04/21/21 1215  PainSc: 3                  Lidia Collum

## 2021-04-21 NOTE — Op Note (Signed)
DATE OF PROCEDURE: 04/21/2021  OPERATIVE REPORT   SURGEON: Leta Baptist, MD   PREOPERATIVE DIAGNOSES:  1. Severe nasal septal deviation.  2. Bilateral inferior turbinate hypertrophy.  3. Chronic nasal obstruction.  POSTOPERATIVE DIAGNOSES:  1. Severe nasal septal deviation.  2. Bilateral inferior turbinate hypertrophy.  3. Chronic nasal obstruction.  PROCEDURE PERFORMED:  1. Septoplasty.  2. Bilateral partial inferior turbinate resection.   ANESTHESIA: General endotracheal tube anesthesia.   COMPLICATIONS: None.   ESTIMATED BLOOD LOSS: 100 mL.   INDICATION FOR PROCEDURE: Jay Brooks is a 60 y.o. male with a history of chronic nasal obstruction. The patient was treated with antihistamine, decongestant, and steroid nasal sprays. However, the patient continued to be symptomatic. On examination, the patient was noted to have bilateral severe inferior turbinate hypertrophy and significant rightward nasal septal deviation, causing significant nasal obstruction. Based on the above findings, the decision was made for the patient to undergo the above-stated procedures. The risks, benefits, alternatives, and details of the procedures were discussed with the patient. Questions were invited and answered. Informed consent was obtained.   DESCRIPTION OF PROCEDURE: The patient was taken to the operating room and placed supine on the operating table. General endotracheal tube anesthesia was administered by the anesthesiologist. The patient was positioned, and prepped and draped in the standard fashion for nasal surgery. Pledgets soaked with Afrin were placed in both nasal cavities for decongestion. The pledgets were subsequently removed.   Examination of the nasal cavity revealed a severe nasal septal deviation. 1% lidocaine with 1:100,000 epinephrine was injected onto the nasal septum bilaterally. A hemitransfixion incision was made on the left side. The mucosal flap was carefully elevated on the left  side. A cartilaginous incision was made 1 cm superior to the caudal margin of the nasal septum. Mucosal flap was also elevated on the right side in the similar fashion. It should be noted that due to the severe septal deviation, the deviated portion of the cartilaginous and bony septum had to be removed in piecemeal fashion. Once the deviated portions were removed, a straight midline septum was achieved. The septum was then quilted with 4-0 plain gut sutures. The hemitransfixion incision was closed with interrupted 4-0 chromic sutures.   The inferior one half of both hypertrophied inferior turbinate was crossclamped with a Kelly clamp. The inferior one half of each inferior turbinate was then resected with a pair of cross cutting scissors. Hemostasis was achieved with a suction cautery device. Doyle splints were applied to the nasal septum.  The care of the patient was turned over to the anesthesiologist. The patient was awakened from anesthesia without difficulty. The patient was extubated and transferred to the recovery room in good condition.   OPERATIVE FINDINGS: Severe nasal septal deviation and bilateral inferior turbinate hypertrophy.   SPECIMEN: None.   FOLLOWUP CARE: The patient be discharged home once he is awake and alert. The patient will be placed on Percocet p.r.n. pain, and amoxicillin 875 mg p.o. b.i.d. for 3 days. The patient will follow up in my office in 2 days for splint removal.   Arlanda Shiplett Raynelle Bring, MD

## 2021-04-21 NOTE — H&P (Signed)
Cc: Chronic nasal obstruction  HPI: The patient is a 60 year old male who presents today for evaluation of nasal congestion and hearing loss. The patient was last seen in 2020. At that time, he was noted to have chronic rhinitis, nasal septal deviation, and bilateral inferior turbinate hypertrophy. No acute infection was noted at that time. The patient's symptoms were controlled with Flonase, Azelastine, and Atrovent. The patient also has a history of bilateral sensorineural hearing loss. The patient presents today with persistent right sided nasal obstruction that is worse at night. The patient has recently been diagnosed with possible sleep apnea. He does not tolerate his trial CPAP. The patient has been using Afrin the past few nights to breathe. The patient still uses Flonase, Azelastine, and Atrovent. The patient also states his wife has been complaining about his hearing. He has not noted any changes in his hearing since his last audiogram. No otalgia or otorrhea is noted. No other ENT, GI, or respiratory issue noted since the last visit.   Exam: General: Communicates without difficulty, well nourished, no acute distress. Head: Normocephalic, no evidence injury, no tenderness, facial buttresses intact without stepoff. Eyes: PERRL, EOMI. No scleral icterus, conjunctivae clear. Neuro: CN II exam reveals vision grossly intact. No nystagmus at any point of gaze. Ears: Auricles well formed without lesions. Ear canals are intact without mass or lesion. No erythema or edema is appreciated. The TMs are intact without fluid. Nose: External evaluation reveals normal support and skin without lesions. Dorsum is intact. Anterior rhinoscopy reveals congested and edematous mucosa over anterior aspect of the inferior turbinates and nasal septum. No purulence is noted. Oral:  Oral cavity and oropharynx are intact, symmetric, without erythema or edema. Mucosa is moist without lesions. Neck: Full range of motion without  pain. There is no significant lymphadenopathy. No masses palpable. Thyroid bed within normal limits to palpation. Parotid glands and submandibular glands equal bilaterally without mass. Trachea is midline. Neuro:  CN 2-12 grossly intact. Vestibular: No nystagmus at any point of gaze. A flexible scope was inserted into the right nasal cavity. NSD with spur. Endoscopy of the inferior and middle meatus was performed. The edematous mucosa was as described above. No polyp, mass, or lesion was appreciated. Olfactory cleft was clear. Nasopharynx was clear. Turbinates were hypertrophied but without mass. Incomplete response to decongestion. The procedure was repeated on the contralateral side with similar findings.   Assessment  1. Chronic rhinitis with nasal mucosal congestion, nasal septal deviation, and bilateral inferior turbinate hypertrophy. Right septal spur impinges on the lateral wall. No purulent drainage, polyps, or other suspicious mass or lesion is noted on today's nasal endoscopy. 2. The patient's ear canals, tympanic membranes and middle ear spaces are all normal.  3. Stable bilateral high frequency sensorineural hearing loss.   Plan  1. The physical exam, hearing test, and nasal endoscopy findings are reviewed with the patient.  2. The patient is a candidate for hearing amplification.   The hearing aid options are discussed with the patient.  3. Treatment options include conservative management with daily steroid nasal spray versus septoplasty and bilateral inferior turbinate reduction. The risks, benefits, alternatives, and details of the procedure are reviewed with the patient. Questions are invited and answered. 4. The patient would like to proceed with the septoplasty and turbinate reduction procedure.

## 2021-04-21 NOTE — Discharge Instructions (Addendum)

## 2021-04-21 NOTE — Transfer of Care (Signed)
Immediate Anesthesia Transfer of Care Note  Patient: Jay Brooks  Procedure(s) Performed: NASAL SEPTOPLASTY WITH  BILATERAL TURBINATE REDUCTION (Bilateral: Nose)  Patient Location: PACU  Anesthesia Type:General  Level of Consciousness: drowsy  Airway & Oxygen Therapy: Patient Spontanous Breathing and Patient connected to nasal cannula oxygen  Post-op Assessment: Report given to RN and Post -op Vital signs reviewed and stable  Post vital signs: Reviewed and stable  Last Vitals:  Vitals Value Taken Time  BP 137/81 04/21/21 1136  Temp    Pulse 65 04/21/21 1138  Resp 13 04/21/21 1138  SpO2 100 % 04/21/21 1138  Vitals shown include unvalidated device data.  Last Pain:  Vitals:   04/21/21 0813  PainSc: 0-No pain         Complications: No notable events documented.

## 2021-04-21 NOTE — Progress Notes (Signed)
60  Dr. Kerin Perna aware of patient c/o nausea new order received

## 2021-04-22 ENCOUNTER — Encounter (HOSPITAL_COMMUNITY): Payer: Self-pay | Admitting: Otolaryngology

## 2021-04-24 ENCOUNTER — Emergency Department: Payer: 59

## 2021-04-24 ENCOUNTER — Other Ambulatory Visit: Payer: Self-pay

## 2021-04-24 ENCOUNTER — Other Ambulatory Visit: Payer: Self-pay | Admitting: Internal Medicine

## 2021-04-24 DIAGNOSIS — K5900 Constipation, unspecified: Secondary | ICD-10-CM | POA: Diagnosis not present

## 2021-04-24 DIAGNOSIS — Z5321 Procedure and treatment not carried out due to patient leaving prior to being seen by health care provider: Secondary | ICD-10-CM | POA: Diagnosis not present

## 2021-04-24 NOTE — ED Notes (Signed)
Bladder scanned results are 558mL

## 2021-04-24 NOTE — ED Triage Notes (Signed)
Pt states is constipated and has not had a bowel movement in several days. Pt states he also has not been able to urinate since this afternoon. Pt appears in no acute distress.

## 2021-04-25 ENCOUNTER — Emergency Department
Admission: EM | Admit: 2021-04-25 | Discharge: 2021-04-25 | Disposition: A | Discharge status: Home / Self Care | Payer: 59 | Attending: Emergency Medicine | Admitting: Emergency Medicine

## 2021-04-25 LAB — URINALYSIS, COMPLETE (UACMP) WITH MICROSCOPIC
Bacteria, UA: NONE SEEN
Bilirubin Urine: NEGATIVE
Glucose, UA: NEGATIVE mg/dL
Hgb urine dipstick: NEGATIVE
Ketones, ur: NEGATIVE mg/dL
Leukocytes,Ua: NEGATIVE
Nitrite: NEGATIVE
Protein, ur: NEGATIVE mg/dL
Specific Gravity, Urine: 1.017 (ref 1.005–1.030)
Squamous Epithelial / HPF: NONE SEEN (ref 0–5)
pH: 6 (ref 5.0–8.0)

## 2021-04-29 ENCOUNTER — Other Ambulatory Visit: Payer: Self-pay | Admitting: Internal Medicine

## 2021-04-29 MED ORDER — CEPHALEXIN 500 MG PO CAPS: ORAL_CAPSULE | ORAL | 0 refills | Status: DC

## 2021-05-04 ENCOUNTER — Encounter: Payer: Self-pay | Admitting: Neurology

## 2021-05-04 DIAGNOSIS — Z0289 Encounter for other administrative examinations: Secondary | ICD-10-CM

## 2021-05-11 ENCOUNTER — Telehealth: Payer: Self-pay

## 2021-05-11 NOTE — Telephone Encounter (Signed)
Pt is requesting a call back regarding getting his Inspire OV moved up or added to a wait list. Pt is also wanting to know if Dr. Brett Fairy can give him other people that have had the Inspire procedure that he can talk to. I informed him I am unsure what info Dr. Brett Fairy can give him without it being a violation of HIPPA. Please advise.

## 2021-05-12 NOTE — Telephone Encounter (Signed)
Called the pt back to offer a opening with Dr Brett Fairy for Monday at 9 am. There was no answer. LVM for a call back.  **If pt calls back please offer the slot on hold for Monday 7/11 at 9 am.

## 2021-05-13 NOTE — Telephone Encounter (Signed)
Received a notification from the sleep lab that patient had called and left a voicemail on their machine confirming this appointment for Monday at 9 AM.  Unfortunately since the call was not made back to the main office number and I did not hear back from him personally, the spot was open for another patient.  Called the patient to make him aware of this information.  He did not answer.  Left voicemail advising of this information.  Offered an appointment July 13 at 11:30 am for him. Instructed the pt to make sure to call the main office number or send me a mychart message advising if that time would work for him.    **If pt calls back please ask if he would like 7/13 at 11:30 am

## 2021-05-14 NOTE — Telephone Encounter (Signed)
Appointment has been made for 7/13 at 11:30 am with check in of 11

## 2021-05-14 NOTE — Telephone Encounter (Signed)
Pt called and confirmed that he is ok with the 7/13 11:30am appt.

## 2021-05-18 ENCOUNTER — Other Ambulatory Visit: Payer: Self-pay | Admitting: Internal Medicine

## 2021-05-18 DIAGNOSIS — R519 Headache, unspecified: Secondary | ICD-10-CM

## 2021-05-18 DIAGNOSIS — R0989 Other specified symptoms and signs involving the circulatory and respiratory systems: Secondary | ICD-10-CM

## 2021-05-19 ENCOUNTER — Encounter: Payer: Self-pay | Admitting: Neurology

## 2021-05-19 ENCOUNTER — Ambulatory Visit (INDEPENDENT_AMBULATORY_CARE_PROVIDER_SITE_OTHER): Payer: 59 | Admitting: Neurology

## 2021-05-19 VITALS — BP 116/72 | HR 52 | Ht 69.0 in | Wt 170.0 lb

## 2021-05-19 DIAGNOSIS — F5104 Psychophysiologic insomnia: Secondary | ICD-10-CM

## 2021-05-19 DIAGNOSIS — Z789 Other specified health status: Secondary | ICD-10-CM | POA: Diagnosis not present

## 2021-05-19 DIAGNOSIS — Z9889 Other specified postprocedural states: Secondary | ICD-10-CM | POA: Diagnosis not present

## 2021-05-19 DIAGNOSIS — G4733 Obstructive sleep apnea (adult) (pediatric): Secondary | ICD-10-CM

## 2021-05-19 NOTE — Progress Notes (Signed)
SLEEP MEDICINE CLINIC    Provider:  Larey Seat, MD  Primary Care Physician:  Unk Pinto, MD [address] [place] [address]     Referring Provider: Unk Pinto, [place] [address] [place],  [place] [id number]          Chief Complaint according to patient   Patient presents with:     New Patient (Initial Visit)     pt alone, rm 10. presents today for concerns of chronic sleep concerns and [place] regulations- .  ENT, completed post op visit and things were doing well. Here to discuss inspire. He has not completed the repeat HST post ENT procedure. He is looking into and getting more information on dental device       HISTORY OF PRESENT ILLNESS:   05-19-2021:  Jay Brooks is a 60 y.o. year old  Caucasian male patient seen here In a RV 05/19/2021 : Jay Brooks just underwent a nasal septoplasty with bilateral turbinate reduction I have the detailed note from Dr. Flossie Buffy dated 04-21-2021 and he recently saw the surgeon again and has been told that he is 90% healed at this time.  If his nasal patency is significantly improved we should retest him if apnea is still present.  Sleep apnea is a symptom of mouth breathing patient's and CPAP helps to keep an air splint open in the back of the throat and allowing airflow this way CPAP also relies on nasal passage to be effectively applying pressure.  So at this time I would like to know where the new apnea degree may be after the surgery and if we need to continue with treatment of apnea or if a dental device can then be used instead.  So I will order a home sleep test as a control test I am booked out for at least 3 weeks so it will be enough to work sleep early August weeks probably.  And I like for the patient to be completely healed before we would reinitiate any CPAP should that be the recommendation of the sleep study.        07-15-2020 The patient underwent a home sleep test by watch pat this  was his third sleep study ,  This home sleep test documented an AHI of 31.3 strongly dependent on supine sleep position also sleeping on your back.  Sleep he usually avoids that at home, there was not much REM sleep dependency noted there was no clinical significant hypoxemia noted and he had a normal pulse range variability between 45 and 110 bpm this is entirely normal for age and gender.  Since a home sleep test that the documented severe OSA he just tipped the 13th apnea marked without any central apneas.  He felt that CPAP would be the best option however CPAP has not been helpful with his second main sleep disorder chronic insomnia.  His AHI is compliant use of CPAP has been 0.7 so this is a very significant reduction the 95th percentile pressure he needs is only 6 cmH2O minimum pressure was set at 5 maximum pressure at 15 and median usage on days used is just 4 hours 2 minutes.  However there were several days where the machine was not used at all.  What I like to do is he has tried several masks but has not found any of them very comfortable, his medication list is fairly unchanged.  He endorsed the Epworth sleepiness score at  3 points and the fatigue severity at 9 points.   My question for the patient is if he would rather consider an inspire device than using CPAP.  He reports being unhappy with CPAP.       See first 80-02 2021 from dr. Melford Aase, his PCP.  Chief concern according to patient :  I can't fall asleep and have trouble with fatigue " . As long as a stay active and stimulated, I wont get sleepy.  I yawn a lot while driving"   I have the pleasure of seeing Jay Brooks , a right handed  Caucasian male with a possible chronic sleep disorder.  he developed a chronic insomnia, chronic cough, head pressure, lightheadedness.  He  has a past medical history of Allergy, Chronic cough, Chronic sinusitis, Diverticulitis (2018), Diverticulosis of colon (without mention of hemorrhage), Early  satiety, Esophageal motility disorder (12/2018), Family history of adverse reaction to anesthesia, GERD (gastroesophageal reflux disease), History of kidney stones, History of left inguinal hernia, Hypertension, Insomnia, Lyme disease, Pulmonary nodules (10/2018), Right inguinal hernia (2017), Sleep apnea, Sleep deficient, Testosterone deficiency, Vitamin D deficiency, and Wears glasses.  patient is under FAA restriction for sleep aids, SSRI and Tricyclica.  2) Vertigo started with BP medication adjustment, BP currently well controlled, but still some  lightheadedness.  3) Mucinex in AM- clear sinus, use a sinus rinse. 4) GERD - continue Pepcid at bedtime    He has chronic insomnia problems, he uses Benadryl. 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 He flies all  domestic, [place], [place])  The patient had the first sleep study in the year 2005 with a negative result of "no apnea present"    Sleep relevant medical history: Nocturia once, no Tonsillectomy. Head injury in childhood.   Family medical /sleep history:mother died of pancreatic cancer.  no other family member with insomnia, maternal uncle was obese and had OSA.   Social history:  Patient is working as a Insurance underwriter and out-of work since 08-2018 and lives in a household with spouse and a college age daughter is home right now. Family status is married  With one daughter. The patient currently is out- of work.  Pets are present. One cat.  Tobacco use: none .  ETOH use : seldomly ,  Caffeine intake in form of Coffee( in AM ) Soda( lunchtime) Tea ( afternoon ) no energy drinks. Regular exercise none.   Sleep habits are as follows: The patient's dinner time is between 6 PM. The patient goes to bed at 11.30 PM and he often sleeps not before midnight. continues to sleep for 4-6 hours, wakes for one bathroom break.   The bedroom is cool,  quiet and dark- wife snores. TV is not used .  The preferred sleep position is right sided. , with the support of 1 pillow. Dreams are reportedly rare. He sometimes yells. 6-7  AM is the usual rise time. The patient wakes up spontaneously.  He  reports not feeling refreshed or restored in AM, with symptoms such as dry mouth, morning headaches and sinus pressure , and residual fatigue.  Naps are taken infrequently.   Review of Systems: Out of a complete 14 system review, the patient complains of only the following symptoms, and all other reviewed systems are negative.:  Fatigue, sleepiness , snoring, fragmented sleep, Insomnia.  How likely are you to doze in the following situations: 0 = not likely, 1 = slight chance, 2 = moderate chance, 3 = high chance   Sitting and Reading? Watching Television? Sitting inactive in a public place (theater or meeting)? As a passenger in a car for an hour without a break? Lying down in the afternoon when circumstances permit? Sitting and talking to someone? Sitting quietly after lunch without alcohol? In a car, while stopped for a few minutes in traffic?   Total = 11/ 24 points   FSS endorsed at  9 - down from 38/ 63 points.   Social History   Socioeconomic History   Marital status: Married    Spouse name: Not on file   Number of children: 1   Years of education: Not on file   Highest education level: Not on file  Occupational History   Occupation: PILOT    Employer: [employer]  Tobacco Use   Smoking status: Never   Smokeless tobacco: Never  Vaping Use   Vaping Use: Never used  Substance and Sexual Activity   Alcohol use: Not Currently    Comment: occ   Drug use: No   Sexual activity: Not on file  Other Topics Concern   Not on file  Social History Narrative   Daily caffeine    Social Determinants of Health   Financial Resource Strain: Not on file  Food Insecurity: Not on file  Transportation Needs: Not on file  Physical  Activity: Not on file  Stress: Not on file  Social Connections: Not on file    Family History  Problem Relation Age of Onset   Breast cancer Maternal Grandmother    Colon polyps Brother    Colon polyps Maternal Uncle    Cancer Mother 44       pancreatic   Colon cancer Neg Hx    Esophageal cancer Neg Hx    Stomach cancer Neg Hx    Ulcerative colitis Neg Hx     Past Medical History:  Diagnosis Date   Allergy    Chronic cough    Chronic sinusitis    Diverticulitis 2018   Mild   Diverticulosis of colon (without mention of hemorrhage)    Early satiety    Esophageal motility disorder 12/2018   Family history of adverse reaction to anesthesia    mom has PONV   GERD (gastroesophageal reflux disease)    History of kidney stones    2017   History of left inguinal hernia    Hypertension    Insomnia    Lyme disease    QUESTIONABLE   Pulmonary nodules 10/2018   Scattered tiny subpleural pulmonary nodules at the peripheral right lung base, largest 3 mm   Right inguinal hernia 2017   Small fat containing right inguinal hernia   Sleep apnea    Sleep deficient    Testosterone deficiency    Vitamin D deficiency    Wears glasses     Past Surgical History:  Procedure Laterality Date   BRAVO [place] STUDY N/A 05/02/2019   Procedure: BRAVO [place] STUDY;  Surgeon: Milus Banister, MD;  Location: [facility];  Service: Endoscopy;  Laterality: N/A;   COLONOSCOPY     ESOPHAGOGASTRODUODENOSCOPY (EGD) WITH PROPOFOL N/A 05/02/2019   Procedure: ESOPHAGOGASTRODUODENOSCOPY (EGD) WITH PROPOFOL;  Surgeon: Milus Banister, MD;  Location: [facility];  Service: Endoscopy;  Laterality: N/A;   HERNIA REPAIR     NASAL SEPTOPLASTY W/ TURBINOPLASTY Bilateral 04/21/2021  Procedure: NASAL SEPTOPLASTY WITH  BILATERAL TURBINATE REDUCTION;  Surgeon: Leta Baptist, MD;  Location: [facility] OR;  Service: ENT;  Laterality: Bilateral;   TOE SURGERY     INGROWN TOE NAIL, CHILDHOOD   UPPER GASTROINTESTINAL ENDOSCOPY      VASECTOMY       Current Outpatient Medications on File Prior to Visit  Medication Sig Dispense Refill   bisoprolol (ZEBETA) 10 MG tablet TAKE 1/2 TO 1 TABLET DAILY FOR BLOOD PRESSURE 90 tablet 1   cholecalciferol (VITAMIN D) 25 MCG (1000 UNIT) tablet Take 1,000 Units by mouth daily.     clindamycin (CLEOCIN T) 1 % lotion Apply 1 application topically every evening.     famotidine (PEPCID) 20 MG tablet Take 20 mg by mouth daily.     fluticasone (FLONASE) 50 MCG/ACT nasal spray Place 2 sprays into both nostrils daily.      hydrocortisone cream 1 % Apply 1 application topically daily as needed for itching.     ketotifen (ZADITOR) 0.025 % ophthalmic solution Uses prn 5 mL 0   loratadine (CLARITIN) 10 MG tablet Take 10 mg by mouth daily.     montelukast (SINGULAIR) 10 MG tablet Take  1 tablet  Daily  for Allergies (Patient taking differently: Take 10 mg by mouth daily.) 90 tablet 3   olmesartan (BENICAR) 20 MG tablet TAKE 1/2 TO 1 TABLET AT NIGHT FOR BP (Patient taking differently: Take 10 mg by mouth every evening.) 90 tablet 0   Olopatadine HCl 0.2 % SOLN Place 1 drop into both eyes daily as needed.     Omega-3 1000 MG CAPS Take 1,000 mg by mouth daily.     OVER THE COUNTER MEDICATION Take 2 capsules by mouth at bedtime. Serenagen otc supplement     RABEprazole (ACIPHEX) 20 MG tablet Take 1 tablet (20 mg total) by mouth 2 (two) times daily. 60 tablet 6   tadalafil (CIALIS) 5 MG tablet Take 5 mg by mouth in the morning.     tamsulosin (FLOMAX) 0.4 MG CAPS capsule TAKE 1 CAPSULE AT BEDTIME FOR PROSTATE (Patient taking differently: Take 0.4 mg by mouth at bedtime.) 90 capsule 2   triamcinolone cream (KENALOG) 0.1 % Apply 1 application topically 2 (two) times daily as needed for itching.     No current facility-administered medications on file prior to visit.    Allergies  Allergen Reactions   Allegra [Fexofenadine] Other (See Comments)    Worsened insomnia   Benzoyl Peroxide     blisters    Decadron [Dexamethasone]     Reflux & Hiccups, GI upset   Doxycycline Nausea Only   Levaquin [Levofloxacin] Nausea And Vomiting    Patient states that after he took levaquin this past time he had nausea with vomiting    Physical exam:  Today's Vitals   05/19/21 1114  BP: 116/72  Pulse: (!) 52  Weight: 170 lb (77.1 kg)  Height: 5\' 9"  (1.753 m)   Body mass index is 25.1 kg/m.   Wt Readings from Last 3 Encounters:  05/19/21 170 lb (77.1 kg)  04/24/21 170 lb (77.1 kg)  04/21/21 169 lb 15.6 oz (77.1 kg)     Ht Readings from Last 3 Encounters:  05/19/21 5\' 9"  (1.753 m)  04/24/21 5\' 9"  (1.753 m)  04/21/21 5\' 9"  (1.753 m)      General: The patient is awake, alert and appears not in acute distress. The patient is well groomed. Head: Normocephalic, atraumatic. Neck is supple. Mallampati 2- very  exaggerated gag reflex. ,  neck circumference: 17 inches . Nasal airflow patent.  Retrognathia is  seen.   Cardiovascular:  Regular rate and cardiac rhythm by pulse,  without distended neck veins. Respiratory: Lungs are clear to auscultation.  Skin:  Without evidence of ankle edema, or rash. Trunk: The patient's posture is erect.   Neurologic exam : The patient is awake and alert, oriented to place and time.   Memory subjective described as intact.  Attention span & concentration ability appears normal.  Speech is fluent,  without  dysarthria, dysphonia or aphasia.  Mood and affect are appropriate.   Cranial nerves: no loss of smell or taste reported  Pupils are equal and briskly reactive to light. Funduscopic exam deferred.  Extraocular movements in vertical and horizontal planes were intact and without nystagmus. No Diplopia. senistivity to sinus pressure points around the orbit. Visual fields by finger perimetry are intact. Hearing was intact to soft voice and finger rubbing.  Facial sensation intact to fine touch.  Facial motor strength is symmetric and tongue and uvula move  midline.  Neck ROM : rotation, tilt and flexion extension were normal for age and shoulder shrug was symmetrical.    Motor exam:  Symmetric bulk, tone and ROM.   Normal tone without cog wheeling, symmetric grip strength .   Sensory:  Fine touch, pinprick and vibration were tested  and  normal.  Proprioception tested in the upper extremities was normal. Coordination: no evidence of ataxia, dysmetria or tremor. Gait and station: Patient could rise unassisted from a seated position, walked without assistive device.  Stance is of normal width/ base and the patient turned with 3 steps.  Toe and heel walk were deferred.  Deep tendon reflexes: in the  upper and lower extremities are symmetric and intact.  Babinski response was deferred.       After spending a total time of 25  minutes face to face and additional time for physical and neurologic examination, review of laboratory studies,  personal review of imaging studies, reports and results of other testing and review of referral information / records as far as provided in visit, I have established the following assessments:  Chronic insomnia , previous evaluations have not found an organic cause.  I would recommend a cognitive behaviour therapy if our repeat sleep study gives similar results.  Sinus CT was normal. Dr Benjamine Mola.  FAA - he is required to treat his apnea- and can't tolerate CPAP- Insomnia. He only can use it 4 hours at night- FAA wants to see 6 hours at night. He does not take sleep aids that were provided- he has not been compliant with either. He cannot initiate sleep- !    My Plan is to proceed with:  Repeat HST at no costs.  I will refer for INSPIRE if Sleep apnea is conducive for this therapy.    I would like to thank Dr Benjamine Mola, and Unk Pinto, [place],  [place] [address] for allowing me to meet with and to take care of this pleasant patient.   In short, Jay Brooks is presenting with a  HST from last year that diagnosed severe OSA- not witnessed by wife- ( HST based dx:may be error ) fatigue and non- organic sleep insomnia.  Now had septoplasty and we need to check his apnea level now , post op- and if he is otherwise an [place] candidate.    [place] approved inspire for treatment.  Electronically signed by: Larey Seat, MD 05/19/2021 11:43 AM  [facility] Board certified by The [employer] of Sleep Medicine and Diplomate of the [employer] of Sleep Medicine. Board certified In Neurology through the [place], Fellow of the [employer] of Neurology. Medical Director of [facility].

## 2021-05-19 NOTE — Patient Instructions (Signed)
Repeat HST post septoplasty- 04-21-2021.

## 2021-06-09 ENCOUNTER — Ambulatory Visit (INDEPENDENT_AMBULATORY_CARE_PROVIDER_SITE_OTHER): Payer: 59 | Admitting: Neurology

## 2021-06-09 DIAGNOSIS — Z789 Other specified health status: Secondary | ICD-10-CM

## 2021-06-09 DIAGNOSIS — Z9889 Other specified postprocedural states: Secondary | ICD-10-CM

## 2021-06-09 DIAGNOSIS — G4733 Obstructive sleep apnea (adult) (pediatric): Secondary | ICD-10-CM

## 2021-06-09 DIAGNOSIS — F5104 Psychophysiologic insomnia: Secondary | ICD-10-CM

## 2021-06-12 ENCOUNTER — Encounter: Payer: Self-pay | Admitting: Gastroenterology

## 2021-06-16 NOTE — Progress Notes (Signed)
[facility] TEST REPORT ( by Watch PAT)   STUDY DATE:  06-09-2021, data loaded 06-16-2021 DOB:  1961-03-31 MRN: [id number]   ORDERING CLINICIAN: Jeb Levering, MD  REFERRING CLINICIAN: Dr Jones Broom, MD   CLINICAL INFORMATION/HISTORY: Patient saw Dr. Benjamine Mola in follow up after ENT surgery, for nasal airway patency and should be healed enough to try CPAP -if still needed. Mr. Pontrelli had undergone a home sleep test 12 months ago which documented an AHI of 31.3 at the time and was strongly dependent on supine sleep position.   Epworth sleepiness score: 11/24.   BMI: 25.5kg/m   Neck Circumference:17"    FINDINGS:   Sleep Summary:   Total Recording Time (hours, min):   the total recording time amounted to 7 hours and 46 minutes.  Total sleep time was 5 hours 39 minutes.  8.8% of the sleep time were REM sleep.                                   Respiratory Indices:   Calculated pAHI (per hour): This overall apnea-hypopnea index amounted to only 9.2/h with a REM AHI of 16.1 non-REM of 8.6/h,   AHI was 8.4/h the nonsupine and 11.1/h.  Interesting is that the highest AHI was in prone sleep position 18.5/ hour and on the right side the patient only had 7.6 apneas and hypopneas per hour.                                                                          Oxygen Saturation Statistics:       O2 Saturation Range (%): The oxygen saturation ranges between 89% at nadir and a maximum of 98% saturation with a mean O2 saturation of 94%.  Time below 89% saturation was 0.5 minutes.                                      Pulse Rate Statistics:              Pulse Range: The minimum pulse rate was 43 bpm and maximum was 83 bpm the mean pulse rate was 51 bpm.  Please notice that home sleep test cannot give cardiac rhythm data only heart rate data.               IMPRESSION:  This HST confirms the presence of only mild apnea and this apnea is still accentuated by REM sleep. This  likely explains the low REM sleep proportion as well.  Bradycardia is present in sleep and in wakeful state - see last office visit vitals.    RECOMMENDATION: I like to give this patient now several options: The patient to free to try auto CPAP with a nasal pillow, mainly because this is still very REM dominant apnea.  Alternatively, if CPAP intolerant, he can be using inspire to reduce apnea, a dental device can reduce apnea, and as long as he sleeps on his side- he has a right lateral sleep position AHI of only 7.6/h.  INTERPRETING PHYSICIAN:Carmen Dohmeier, MD   Medical Director of [facility] at [facility].

## 2021-06-17 NOTE — Progress Notes (Signed)
Future Appointments  Date Time Provider [place]  06/18/2021 11:30 AM Unk Pinto, MD [facility] None  09/14/2021  -  CPE  3:00 PM Unk Pinto, MD [facility] None    History of Present Illness:      Patient is a very nice 60 yo MWM with hx/o  labile HTN, HLD, Prediabetes, Testosterone Deficiency and Vitamin D Deficiency. Patient has hx/o Low Testosterone  (150" in 2010) who had been on testosterone replacement til stopping about Dec 2021 (8-9 months ago) for perceived lack of benefit. More recently he has felt more fatigued and desires to resume treatment in hopes of improving his fatigue.  Systems review is otherwise negative   Medications    bisoprolol (ZEBETA) 10 MG tablet, TAKE 1/2 TO 1 TABLET DAILY FOR BLOOD PRESSURE   olmesartan (BENICAR) 20 MG tablet, TAKE 1/2 TO 1 TABLET AT NIGHT FOR BP (Patient taking differently: Take 10 mg by mouth every evening.)   tadalafil (CIALIS) 5 MG tablet, Take 5 mg by mouth in the morning.   fluticasone (FLONASE) 50 MCG/ACT nasal spray, Place 2 sprays into both nostrils daily.    loratadine (CLARITIN) 10 MG tablet, Take 10 mg by mouth daily.   montelukast (SINGULAIR) 10 MG tablet, Take  1 tablet  Daily  for Allergies (Patient taking differently: Take 10 mg by mouth daily.)   cholecalciferol (VITAMIN D) 25 MCG (1000 UNIT) tablet, Take 1,000 Units by mouth daily.   clindamycin (CLEOCIN T) 1 % lotion, Apply 1 application topically every evening.   famotidine (PEPCID) 20 MG tablet, Take 20 mg by mouth daily.   hydrocortisone cream 1 %, Apply 1 application topically daily as needed for itching.   ketotifen (ZADITOR) 0.025 % ophthalmic solution, Uses prn   Olopatadine HCl 0.2 % SOLN, Place 1 drop into both eyes daily as needed.   Omega-3 1000 MG CAPS, Take 1,000 mg by mouth daily.   OVER THE COUNTER MEDICATION, Take 2 capsules by mouth at bedtime. Serenagen otc supplement   RABEprazole (ACIPHEX) 20 MG tablet, Take 1 tablet (20 mg total) by  mouth 2 (two) times daily.   tamsulosin (FLOMAX) 0.4 MG CAPS capsule, TAKE 1 CAPSULE AT BEDTIME    triamcinolone cream (KENALOG) 0.1 %, Apply 1 application topically 2 (two) times daily as needed for itching.  Problem list He has Cough; GERD (gastroesophageal reflux disease); Chronic rhinitis; Testosterone Deficiency; Hyperlipidemia, mixed; Medication management; Labile hypertension; Abnormal glucose; Vitamin D deficiency; Prostatism; FHx: heart disease; Essential hypertension; Chronic insomnia; OSA (obstructive sleep apnea); Intolerance of continuous positive airway pressure (CPAP) ventilation; and Status post nasal septoplasty on their problem list.   Observations/Objective:  BP 124/70 (Patient Position: Sitting) Comment: 118/78 - pulse 51 standing, 122/74 - pulse 51 laying  Pulse (!) 49   Temp 97.6 F (36.4 C)   Resp 16   Ht '5\' 9"'$  (1.753 m)   Wt 174 lb 6.4 oz (79.1 kg)   SpO2 98%   BMI 25.75 kg/m   HEENT - WNL. Neck - supple.  Chest - Clear equal BS. Cor - Nl HS. RRR w/o sig MGR. PP 1(+). No edema. MS- FROM w/o deformities.  Gait Nl. Neuro -  Nl w/o focal abnormalities.   Assessment and Plan:   1. Essential hypertension   2. Testosterone deficiency  - Testosterone - LH  3. Medication management  - Testosterone - LH  Follow Up Instructions:        I discussed the assessment and treatment plan with the patient.  The patient was provided an opportunity to ask questions and all were answered. The patient agreed with the plan and demonstrated an understanding of the instructions.       The patient was advised to call back or seek an in-person evaluation if the symptoms worsen or if the condition fails to improve as anticipated.    Kirtland Bouchard, MD

## 2021-06-18 ENCOUNTER — Other Ambulatory Visit: Payer: Self-pay

## 2021-06-18 ENCOUNTER — Encounter: Payer: Self-pay | Admitting: Internal Medicine

## 2021-06-18 ENCOUNTER — Ambulatory Visit (INDEPENDENT_AMBULATORY_CARE_PROVIDER_SITE_OTHER): Payer: 59 | Admitting: Internal Medicine

## 2021-06-18 VITALS — BP 124/70 | HR 49 | Temp 97.6°F | Resp 16 | Ht 69.0 in | Wt 174.4 lb

## 2021-06-18 DIAGNOSIS — Z79899 Other long term (current) drug therapy: Secondary | ICD-10-CM

## 2021-06-18 DIAGNOSIS — G250 Essential tremor: Secondary | ICD-10-CM | POA: Diagnosis not present

## 2021-06-18 DIAGNOSIS — I10 Essential (primary) hypertension: Secondary | ICD-10-CM

## 2021-06-18 DIAGNOSIS — E349 Endocrine disorder, unspecified: Secondary | ICD-10-CM | POA: Diagnosis not present

## 2021-06-19 LAB — LUTEINIZING HORMONE: LH: 2.2 m[IU]/mL (ref 1.6–15.2)

## 2021-06-19 LAB — TESTOSTERONE: Testosterone: 236 ng/dL — ABNORMAL LOW (ref 250–827)

## 2021-06-19 NOTE — Progress Notes (Signed)
============================================================ -   Test results slightly outside the reference range are not unusual. If there is anything important, I will review this with you,  otherwise it is considered normal test values.  If you have further questions,  please do not hesitate to contact me at the office or via My Chart.  ============================================================ ============================================================  -  Testosterone = 236  is borderline low  (range is 250 - 827)   - Before start replacement therapy,                                                Recommend Take Zinc 50 mg tablet Daily &   repeat testosterone level in about      6 weeks      as frequently taking Zinc will                                   raise the Testosterone level back in the Normal Range   ~~~~~~~~~~~~~~~~~~~~~~~~~~~~~~~~~~~~~~~~~~~~~~~~~~~~~~~~~~~~ ~~~~~~~~~~~~~~~~~~~~~~~~~~~~~~~~~~~~~~~~~~~~~~~~~~~~~~~~~~~~  - Also, daily exercise 20-30 minutes 2 x / day   Has been shown to raise Testosterone levels  !  ============================================================ ============================================================

## 2021-06-21 ENCOUNTER — Encounter: Payer: Self-pay | Admitting: Gastroenterology

## 2021-06-24 ENCOUNTER — Encounter: Payer: Self-pay | Admitting: Neurology

## 2021-06-24 NOTE — Procedures (Signed)
[facility] TEST REPORT ( by Watch PAT)   STUDY DATE:  06-09-2021, data loaded 06-16-2021 DOB:  1961-03-31 MRN: [id number]   ORDERING CLINICIAN: Jeb Levering, MD  REFERRING CLINICIAN: Dr Jones Broom, MD   CLINICAL INFORMATION/HISTORY: Patient saw Dr. Benjamine Mola in follow up after ENT surgery, for nasal airway patency and should be healed enough to try CPAP -if still needed. Mr. Pontrelli had undergone a home sleep test 12 months ago which documented an AHI of 31.3 at the time and was strongly dependent on supine sleep position.   Epworth sleepiness score: 11/24.   BMI: 25.5kg/m   Neck Circumference:17"    FINDINGS:   Sleep Summary:   Total Recording Time (hours, min):   the total recording time amounted to 7 hours and 46 minutes.  Total sleep time was 5 hours 39 minutes.  8.8% of the sleep time were REM sleep.                                   Respiratory Indices:   Calculated pAHI (per hour): This overall apnea-hypopnea index amounted to only 9.2/h with a REM AHI of 16.1 non-REM of 8.6/h,   AHI was 8.4/h the nonsupine and 11.1/h.  Interesting is that the highest AHI was in prone sleep position 18.5/ hour and on the right side the patient only had 7.6 apneas and hypopneas per hour.                                                                          Oxygen Saturation Statistics:       O2 Saturation Range (%): The oxygen saturation ranges between 89% at nadir and a maximum of 98% saturation with a mean O2 saturation of 94%.  Time below 89% saturation was 0.5 minutes.                                      Pulse Rate Statistics:              Pulse Range: The minimum pulse rate was 43 bpm and maximum was 83 bpm the mean pulse rate was 51 bpm.  Please notice that home sleep test cannot give cardiac rhythm data only heart rate data.               IMPRESSION:  This HST confirms the presence of only mild apnea and this apnea is still accentuated by REM sleep. This  likely explains the low REM sleep proportion as well.  Bradycardia is present in sleep and in wakeful state - see last office visit vitals.    RECOMMENDATION: I like to give this patient now several options: The patient to free to try auto CPAP with a nasal pillow, mainly because this is still very REM dominant apnea.  Alternatively, if CPAP intolerant, he can be using inspire to reduce apnea, a dental device can reduce apnea, and as long as he sleeps on his side- he has a right lateral sleep position AHI of only 7.6/h.  INTERPRETING PHYSICIAN:Boneta Standre, MD   Medical Director of [facility] at [facility].

## 2021-06-24 NOTE — Progress Notes (Signed)
Jay Brooks's ENT surgery has been reducing h the AHI significantly- only MILD APNEA IS LEFT-  RECOMMENDATION: I like to give this patient now several options: The patient to free to try auto CPAP with a nasal pillow, mainly because this is still very REM dominant apnea.  Alternatively, if CPAP intolerant, he can be using inspire to reduce apnea, a dental device can reduce apnea, and as long as he sleeps on his side- he has a right lateral sleep position AHI of only 7.6/h.

## 2021-06-30 ENCOUNTER — Encounter: Payer: Self-pay | Admitting: Neurology

## 2021-07-01 NOTE — Telephone Encounter (Signed)
Dear Jay Brooks, Based on the algorithm of the home sleep test device I feel confident that you have slept for a significant longer time than you perceived.   Your apnea hypopnea index was low and I would feel free to not require any intervention- neither inspire nor CPAP.   A dental device should be enough to reduce apnea and snoring.   You also can choose to sleep on your side as well. Your ENT surgery was clearly a success.   You do not have oxygen desaturations.  I think these are good news.    Sincerely, Larey Seat MD

## 2021-07-06 ENCOUNTER — Ambulatory Visit: Payer: 59 | Admitting: Neurology

## 2021-07-13 ENCOUNTER — Other Ambulatory Visit: Payer: Self-pay

## 2021-07-13 ENCOUNTER — Ambulatory Visit (AMBULATORY_SURGERY_CENTER): Payer: 59 | Admitting: *Deleted

## 2021-07-13 VITALS — Ht 69.0 in | Wt 174.0 lb

## 2021-07-13 DIAGNOSIS — Z1211 Encounter for screening for malignant neoplasm of colon: Secondary | ICD-10-CM

## 2021-07-13 MED ORDER — SUTAB 1479-225-188 MG PO TABS: 1.0000 | ORAL_TABLET | ORAL | 0 refills | Status: DC

## 2021-07-13 NOTE — Progress Notes (Signed)
Patient's pre-visit was done today over the phone with the patient due to COVID-19 pandemic. Name,DOB and address verified. Insurance verified. Patient denies any allergies to Eggs and Soy. Patient denies any problems with anesthesia/sedation. Patient denies taking diet pills or blood thinners. No home Oxygen. Packet of Prep instructions mailed to patient including a copy of a consent form & coupon-pt is aware. Patient understands to call us back with any questions or concerns. Patient is aware of our care-partner policy and 0000000 safety protocol.   EMMI education assigned to the patient for the procedure, sent to [place].   The patient is COVID-19 vaccinated.  Patient explained to me that he was unable to tolerate the liquid prep last time and he request something else. He states he has nausea due to acid reflux and he unable to take miralax also. Sutab pill given to pt. I encouraged patient to talk with Dr.Jacobs about his change in bowel habits and stool consistently.

## 2021-07-26 ENCOUNTER — Other Ambulatory Visit: Payer: Self-pay | Admitting: Internal Medicine

## 2021-07-26 DIAGNOSIS — J302 Other seasonal allergic rhinitis: Secondary | ICD-10-CM

## 2021-07-28 ENCOUNTER — Encounter: Payer: Self-pay | Admitting: Gastroenterology

## 2021-07-28 ENCOUNTER — Other Ambulatory Visit: Payer: Self-pay

## 2021-07-28 ENCOUNTER — Ambulatory Visit (AMBULATORY_SURGERY_CENTER): Payer: 59 | Admitting: Gastroenterology

## 2021-07-28 VITALS — BP 106/50 | HR 67 | Temp 96.0°F | Resp 18 | Ht 69.0 in | Wt 174.0 lb

## 2021-07-28 DIAGNOSIS — Z1211 Encounter for screening for malignant neoplasm of colon: Secondary | ICD-10-CM | POA: Diagnosis not present

## 2021-07-28 MED ORDER — SODIUM CHLORIDE 0.9 % IV SOLN: 500.0000 mL | Freq: Once | INTRAVENOUS | Status: DC

## 2021-07-28 NOTE — Progress Notes (Signed)
HPI: This is a man at routine risk for colon cancer   ROS: complete GI ROS as described in HPI, all other review negative.  Constitutional:  No unintentional weight loss   Past Medical History:  Diagnosis Date   Allergy    Chronic cough    Chronic sinusitis    Diverticulitis 2018   Mild   Diverticulosis of colon (without mention of hemorrhage)    Early satiety    Esophageal motility disorder 12/2018   Family history of adverse reaction to anesthesia    mom has PONV   GERD (gastroesophageal reflux disease)    History of kidney stones    2017   History of left inguinal hernia    Hypertension    Insomnia    Lyme disease    QUESTIONABLE   Pulmonary nodules 10/2018   Scattered tiny subpleural pulmonary nodules at the peripheral right lung base, largest 3 mm   Right inguinal hernia 2017   Small fat containing right inguinal hernia   Sleep apnea    no CPAP   Sleep deficient    Testosterone deficiency    Vitamin D deficiency    Wears glasses     Past Surgical History:  Procedure Laterality Date   BRAVO [place] STUDY N/A 05/02/2019   Procedure: BRAVO [place] STUDY;  Surgeon: Milus Banister, MD;  Location: [facility];  Service: Endoscopy;  Laterality: N/A;   COLONOSCOPY  02/2011   ESOPHAGOGASTRODUODENOSCOPY (EGD) WITH PROPOFOL N/A 05/02/2019   Procedure: ESOPHAGOGASTRODUODENOSCOPY (EGD) WITH PROPOFOL;  Surgeon: Milus Banister, MD;  Location: [facility];  Service: Endoscopy;  Laterality: N/A;   HERNIA REPAIR     NASAL SEPTOPLASTY W/ TURBINOPLASTY Bilateral 04/21/2021   Procedure: NASAL SEPTOPLASTY WITH  BILATERAL TURBINATE REDUCTION;  Surgeon: Leta Baptist, MD;  Location: [facility] OR;  Service: ENT;  Laterality: Bilateral;   TOE SURGERY     INGROWN TOE NAIL, CHILDHOOD   UPPER GASTROINTESTINAL ENDOSCOPY     VASECTOMY      Current Outpatient Medications  Medication Sig Dispense Refill   Azelastine HCl 137 MCG/SPRAY SOLN SMARTSIG:2 Spray(s) Both Nares Every Evening     bisoprolol  (ZEBETA) 10 MG tablet TAKE 1/2 TO 1 TABLET DAILY FOR BLOOD PRESSURE 90 tablet 1   calcium carbonate (TUMS EX) 750 MG chewable tablet Chew 1 tablet by mouth daily.     cholecalciferol (VITAMIN D) 25 MCG (1000 UNIT) tablet Take 1,000 Units by mouth daily.     clindamycin (CLEOCIN T) 1 % lotion Apply 1 application topically every evening.     famotidine (PEPCID) 20 MG tablet Take 20 mg by mouth daily.     hydrocortisone cream 1 % Apply 1 application topically daily as needed for itching.     ketotifen (ZADITOR) 0.025 % ophthalmic solution Uses prn 5 mL 0   loratadine (CLARITIN) 10 MG tablet Take 10 mg by mouth daily.     montelukast (SINGULAIR) 10 MG tablet TAKE 1 TABLET BY MOUTH DAILY FOR ALLERGIES 30 tablet 3   olmesartan (BENICAR) 20 MG tablet TAKE 1/2 TO 1 TABLET AT NIGHT FOR BP (Patient taking differently: Take 10 mg by mouth every evening.) 90 tablet 0   Olopatadine HCl 0.2 % SOLN Place 1 drop into both eyes daily as needed.     RABEprazole (ACIPHEX) 20 MG tablet Take 1 tablet (20 mg total) by mouth 2 (two) times daily. 60 tablet 6   tamsulosin (FLOMAX) 0.4 MG CAPS capsule TAKE 1 CAPSULE AT BEDTIME FOR  PROSTATE (Patient taking differently: Take 0.4 mg by mouth at bedtime.) 90 capsule 2   fluticasone (FLONASE) 50 MCG/ACT nasal spray Place 2 sprays into both nostrils daily.      ipratropium (ATROVENT) 0.06 % nasal spray SMARTSIG:1 Spray(s) Both Nares Twice Daily PRN     Omega-3 1000 MG CAPS Take 1,000 mg by mouth daily.     OVER THE COUNTER MEDICATION Take 2 capsules by mouth at bedtime. Serenagen otc supplement     tadalafil (CIALIS) 5 MG tablet Take 5 mg by mouth in the morning. (Patient not taking: No sig reported)     triamcinolone cream (KENALOG) 0.1 % Apply 1 application topically 2 (two) times daily as needed for itching.     zinc gluconate 50 MG tablet Take 50 mg by mouth daily.     Current Facility-Administered Medications  Medication Dose Route Frequency Provider Last Rate Last Admin    0.9 %  sodium chloride infusion  500 mL Intravenous Once Milus Banister, MD        Allergies as of 07/28/2021 - Review Complete 07/28/2021  Allergen Reaction Noted   Allegra [fexofenadine] Other (See Comments) 10/22/2014   Benzoyl peroxide  04/26/2019   Decadron [dexamethasone]  01/11/2021   Doxycycline Nausea Only 12/30/2014   Levaquin [levofloxacin] Nausea And Vomiting 09/08/2014    Family History  Problem Relation Age of Onset   Breast cancer Maternal Grandmother    Colon polyps Brother    Colon polyps Maternal Uncle    Cancer Mother 46       pancreatic   Colon cancer Neg Hx    Esophageal cancer Neg Hx    Stomach cancer Neg Hx    Ulcerative colitis Neg Hx     Social History   Socioeconomic History   Marital status: Married    Spouse name: Not on file   Number of children: 1   Years of education: Not on file   Highest education level: Not on file  Occupational History   Occupation: PILOT    Employer: [employer]  Tobacco Use   Smoking status: Never   Smokeless tobacco: Never  Vaping Use   Vaping Use: Never used  Substance and Sexual Activity   Alcohol use: Yes    Alcohol/week: 2.0 - 3.0 standard drinks    Types: 2 - 3 Cans of beer per week   Drug use: No   Sexual activity: Not on file  Other Topics Concern   Not on file  Social History Narrative   Daily caffeine    Social Determinants of Health   Financial Resource Strain: Not on file  Food Insecurity: Not on file  Transportation Needs: Not on file  Physical Activity: Not on file  Stress: Not on file  Social Connections: Not on file  Intimate Partner Violence: Not on file     Physical Exam: BP (!) 92/44   Pulse (!) 53   Temp (!) 96 F (35.6 C)   Resp 16   Ht 5\' 9"  (1.753 m)   Wt 174 lb (78.9 kg)   SpO2 99%   BMI 25.70 kg/m  Constitutional: generally well-appearing Psychiatric: alert and oriented x3 Lungs: CTA bilaterally Heart: no MCR  Assessment and plan: 60 y.o. male with  routine risk for CRC  Screening colonoscopy today  Care is appropriate for the ambulatory setting.  Owens Loffler, MD [facility] 07/28/2021, 9:27 AM

## 2021-07-28 NOTE — Progress Notes (Signed)
Pt's states no medical or surgical changes since previsit or office visit.    Check-in-AER  V/S-DT

## 2021-07-28 NOTE — Progress Notes (Signed)
PT taken to PACU. Monitors in place. VSS. Report given to RN. 

## 2021-07-28 NOTE — Patient Instructions (Signed)
THank you for letting us take care of your healthcare needs today. Please see handouts given to you on Diverticulosis.     YOU HAD AN ENDOSCOPIC PROCEDURE TODAY AT [facility]:   Refer to the procedure report that was given to you for any specific questions about what was found during the examination.  If the procedure report does not answer your questions, please call your gastroenterologist to clarify.  If you requested that your care partner not be given the details of your procedure findings, then the procedure report has been included in a sealed envelope for you to review at your convenience later.  YOU SHOULD EXPECT: Some feelings of bloating in the abdomen. Passage of more gas than usual.  Walking can help get rid of the air that was put into your GI tract during the procedure and reduce the bloating. If you had a lower endoscopy (such as a colonoscopy or flexible sigmoidoscopy) you may notice spotting of blood in your stool or on the toilet paper. If you underwent a bowel prep for your procedure, you may not have a normal bowel movement for a few days.  Please Note:  You might notice some irritation and congestion in your nose or some drainage.  This is from the oxygen used during your procedure.  There is no need for concern and it should clear up in a day or so.  SYMPTOMS TO REPORT IMMEDIATELY:  Following lower endoscopy (colonoscopy or flexible sigmoidoscopy):  Excessive amounts of blood in the stool  Significant tenderness or worsening of abdominal pains  Swelling of the abdomen that is new, acute  Fever of 100F or higher   For urgent or emergent issues, a gastroenterologist can be reached at any hour by calling [phone]. Do not use MyChart messaging for urgent concerns.    DIET:  We do recommend a small meal at first, but then you may proceed to your regular diet.  Drink plenty of fluids but you should avoid alcoholic beverages for 24 hours.  ACTIVITY:   You should plan to take it easy for the rest of today and you should NOT DRIVE or use heavy machinery until tomorrow (because of the sedation medicines used during the test).    FOLLOW UP: Our staff will call the number listed on your records 48-72 hours following your procedure to check on you and address any questions or concerns that you may have regarding the information given to you following your procedure. If we do not reach you, we will leave a message.  We will attempt to reach you two times.  During this call, we will ask if you have developed any symptoms of COVID 19. If you develop any symptoms (ie: fever, flu-like symptoms, shortness of breath, cough etc.) before then, please call [phone].  If you test positive for Covid 19 in the 2 weeks post procedure, please call and report this information to [place].    If any biopsies were taken you will be contacted by phone or by letter within the next 1-3 weeks.  Please call us at [phone] if you have not heard about the biopsies in 3 weeks.    SIGNATURES/CONFIDENTIALITY: You and/or your care partner have signed paperwork which will be entered into your electronic medical record.  These signatures attest to the fact that that the information above on your After Visit Summary has been reviewed and is understood.  Full responsibility of the confidentiality of this discharge information lies  with you and/or your care-partner.  

## 2021-07-28 NOTE — Op Note (Signed)
[facility] Patient Name: Jay Brooks Procedure Date: 07/28/2021 8:58 AM MRN: [id number] Endoscopist: Milus Banister , MD Age: 60 Referring MD:  Date of Birth: [birth year] Gender: Male Account #: [id number] Procedure:                Colonoscopy Indications:              Screening for colorectal malignant neoplasm Medicines:                Monitored Anesthesia Care Procedure:                Pre-Anesthesia Assessment:                           - Prior to the procedure, a History and Physical                            was performed, and patient medications and                            allergies were reviewed. The patient's tolerance of                            previous anesthesia was also reviewed. The risks                            and benefits of the procedure and the sedation                            options and risks were discussed with the patient.                            All questions were answered, and informed consent                            was obtained. Prior Anticoagulants: The patient has                            taken no previous anticoagulant or antiplatelet                            agents. ASA Grade Assessment: II - A patient with                            mild systemic disease. After reviewing the risks                            and benefits, the patient was deemed in                            satisfactory condition to undergo the procedure.                           After obtaining informed consent, the colonoscope  was passed under direct vision. Throughout the                            procedure, the patient's blood pressure, pulse, and                            oxygen saturations were monitored continuously. The                            Colonoscope was introduced through the anus and                            advanced to the the cecum, identified by                            appendiceal orifice and  ileocecal valve. The                            colonoscopy was performed without difficulty. The                            patient tolerated the procedure well. The quality                            of the bowel preparation was good. The ileocecal                            valve, appendiceal orifice, and rectum were                            photographed. Scope In: 9:14:01 AM Scope Out: 9:23:53 AM Scope Withdrawal Time: 0 hours 6 minutes 39 seconds  Total Procedure Duration: 0 hours 9 minutes 52 seconds  Findings:                 Multiple small and large-mouthed diverticula were                            found in the left colon.                           Small internal hemorrhoids.                           The exam was otherwise without abnormality on                            direct and retroflexion views. Complications:            No immediate complications. Estimated blood loss:                            None. Estimated Blood Loss:     Estimated blood loss: none. Impression:               - Diverticulosis in the left colon.                           -  Small internal hemorrhoids.                           - The examination was otherwise normal on direct                            and retroflexion views.                           - No polyps or cancers. Recommendation:           - Patient has a contact number available for                            emergencies. The signs and symptoms of potential                            delayed complications were discussed with the                            patient. Return to normal activities tomorrow.                            Written discharge instructions were provided to the                            patient.                           - Resume previous diet.                           - Continue present medications.                           - Repeat colonoscopy in 10 years for screening                             purposes. Milus Banister, MD 07/28/2021 9:26:29 AM This report has been signed electronically.

## 2021-07-30 ENCOUNTER — Telehealth: Payer: Self-pay | Admitting: *Deleted

## 2021-07-30 NOTE — Telephone Encounter (Signed)
  Follow up Call-  Call back number 07/28/2021  Post procedure Call Back phone  # [phone]  Permission to leave phone message Yes  Some recent data might be hidden     Patient questions:  Do you have a fever, pain , or abdominal swelling? No. Pain Score  0 *  Have you tolerated food without any problems? Yes.    Have you been able to return to your normal activities? Yes.    Do you have any questions about your discharge instructions: Diet   No. Medications  No. Follow up visit  No.  Do you have questions or concerns about your Care? No.  Actions: * If pain score is 4 or above: No action needed, pain <4.  Have you developed a fever since your procedure? no  2.   Have you had an respiratory symptoms (SOB or cough) since your procedure? no  3.   Have you tested positive for COVID 19 since your procedure no  4.   Have you had any family members/close contacts diagnosed with the COVID 19 since your procedure?  no   If yes to any of these questions please route to Joylene John, RN and Joella Prince, RN

## 2021-08-25 ENCOUNTER — Other Ambulatory Visit: Payer: Self-pay | Admitting: Neurology

## 2021-08-25 ENCOUNTER — Encounter: Payer: Self-pay | Admitting: Neurology

## 2021-08-25 ENCOUNTER — Telehealth: Payer: Self-pay

## 2021-08-25 DIAGNOSIS — G4733 Obstructive sleep apnea (adult) (pediatric): Secondary | ICD-10-CM

## 2021-08-25 DIAGNOSIS — Z9889 Other specified postprocedural states: Secondary | ICD-10-CM

## 2021-08-25 DIAGNOSIS — F5104 Psychophysiologic insomnia: Secondary | ICD-10-CM

## 2021-08-25 DIAGNOSIS — Z789 Other specified health status: Secondary | ICD-10-CM

## 2021-08-25 NOTE — Telephone Encounter (Signed)
Patient left a voicemail on my phone asking to have his referral and sleep study faxed over to [facility] in [place], [place] for sleep apnea device. Patient phone number is [phone], fax number for dental office is [phone]. Pt completed HST on 06/09/21.

## 2021-08-25 NOTE — Telephone Encounter (Signed)
Order has been placed for the patient as requested for referral to dentist. It has been sent to our referral team who will get the information sent out.

## 2021-08-26 ENCOUNTER — Telehealth: Payer: Self-pay | Admitting: Neurology

## 2021-08-26 NOTE — Telephone Encounter (Signed)
Sent to Dr. Toy Cookey  ph # [phone]

## 2021-08-31 ENCOUNTER — Other Ambulatory Visit: Payer: Self-pay | Admitting: Internal Medicine

## 2021-08-31 NOTE — Telephone Encounter (Signed)
Correction sent to [facility] in [place] ph # [phone]

## 2021-09-10 ENCOUNTER — Telehealth: Payer: Self-pay | Admitting: Gastroenterology

## 2021-09-10 MED ORDER — RABEPRAZOLE SODIUM 20 MG PO TBEC: 20.0000 mg | DELAYED_RELEASE_TABLET | Freq: Two times a day (BID) | ORAL | 1 refills | Status: DC

## 2021-09-10 NOTE — Telephone Encounter (Signed)
Rx for Aciphex sent to pharmacy as requested.

## 2021-09-10 NOTE — Telephone Encounter (Signed)
Inbound call from patient states he need 90 supply for 1 time a day of rabeprazole (aciphex) sent [facility] in [place]

## 2021-09-13 NOTE — Progress Notes (Signed)
Annual  Screening/Preventative Visit  & Comprehensive Evaluation & Examination  Future Appointments  Date Time Provider [place]  09/14/2021  3:00 PM Unk Pinto, MD [facility] None  09/14/2022  2:00 PM Unk Pinto, MD [facility] None            This very nice 60 y.o. MWM  presents for a Screening /Preventative Visit & comprehensive evaluation and management of multiple medical co-morbidities.  Patient has been followed for HTN, HLD, Prediabetes, Testosterone Deficiency  and Vitamin D Deficiency. Patient is followed by Dr Dohmeier with moderately severe OSA on auto CPAP. Due to intolerance with CPAP he is being fitted with an oral appliance .    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 Bravo test per Dr Ardis Hughs did not reveal significant acid reflux and did not feel his cough was related to GERD. He also was seen by ENT, Dr Carol Ada w/o any ENT pathology identified. Allergy Evaluation by Dr Orvil Feil was likewise unrevealing. Patient reports PFT's per Dr Melvyn Novas did not reveal Asthma and empiric treatment with MDI's did not improve his cough.  Patient has been out of work per Dr Melvyn Novas & on Disability for almost a year since last Oct 2019 for Allergic Rhinitis & 'Upper Airway Cough Syndrome'...]]       HTN predates circa 2005.  Patient's BP has been controlled at home.  Today's BP is at goal -  114/70. Patient denies any cardiac symptoms as chest pain, palpitations, shortness of breath, dizziness or ankle swelling.       Patient's hyperlipidemia is controlled with diet and medications. Patient denies myalgias or other medication SE's. Last lipids were not at goal:  Lab Results  Component Value Date   CHOL 174 03/23/2021   HDL 33 (L) 03/23/2021   LDLCALC 105 (H) 03/23/2021   TRIG 250 (H) 03/23/2021   CHOLHDL 5.3 (H)  03/23/2021           Patient has hx/o prediabetes  /Insulin Resistance (A1c 5.0% /elevated Insulin "55" /2014)  and patient denies reactive hypoglycemic symptoms, visual blurring, diabetic polys or paresthesias. Last A1c was at goal:   Lab Results  Component Value Date   HGBA1C 5.1 03/23/2021                                                        Patient has Testosterone Deficiency ("150" /2010) and  is on replacement by injection with improved stamina & sense of well being.        Finally, patient has history of Vitamin D Deficiency ("32" /2009) and last vitamin D was at goal:   Lab Results  Component Value Date   VD25OH 92 03/23/2021     Current Outpatient Medications on File Prior to Visit  Medication Sig   Azelastine HCl 137 MCG/SPRAY SOLN SMARTSIG:2 Spray(s) Both Nares Every Evening   bisoprolol (ZEBETA) 10 MG tablet TAKE 1/2 TO 1 TABLET DAILY FOR BLOOD PRESSURE   calcium carbonate (TUMS EX) 750 MG chewable tablet Chew 1 tablet by mouth daily.   cholecalciferol (VITAMIN D) 25 MCG (1000 UNIT) tablet Take 1,000 Units by mouth daily.   clindamycin (  CLEOCIN T) 1 % lotion Apply 1 application topically every evening.   famotidine (PEPCID) 20 MG tablet Take 20 mg by mouth daily.   fluticasone (FLONASE) 50 MCG/ACT nasal spray Place 2 sprays into both nostrils daily.    hydrocortisone cream 1 % Apply 1 application topically daily as needed for itching.   ipratropium (ATROVENT) 0.06 % nasal spray SMARTSIG:1 Spray(s) Both Nares Twice Daily PRN   ketotifen (ZADITOR) 0.025 % ophthalmic solution Uses prn   loratadine (CLARITIN) 10 MG tablet Take 10 mg by mouth daily.   montelukast (SINGULAIR) 10 MG tablet TAKE 1 TABLET BY MOUTH DAILY FOR ALLERGIES   olmesartan (BENICAR) 20 MG tablet Take 1 tablet at Bedtime for BP                                      TAKE 1/2 TO TABLET    Olopatadine HCl 0.2 % SOLN Place 1 drop into both eyes daily as needed.   Omega-3 1000 MG CAPS Take 1,000 mg by mouth  daily.   Serenagen otc supplement Take 2 capsules  at bedtime.    RABEprazole  20 MG tablet Take 1 tablet  2 (two) times daily.   tamsulosin  0.4 MG CAPS capsule TAKE 1 CAPSULE AT BEDTIME FOR PROSTATE (Patient taking differently: Take 0.4 mg by mouth at bedtime.)   triamcinolone cream (KENALOG) 0.1 % Apply    2   times daily as needed for itching.   zinc 50 MG tablet Take daily.      Allergies  Allergen Reactions   Allegra [Fexofenadine] Other (See Comments)    Worsened insomnia   Benzoyl Peroxide     blisters   Decadron [Dexamethasone]     Reflux & Hiccups, GI upset   Doxycycline Nausea Only   Levaquin [Levofloxacin] Nausea And Vomiting    Patient states that after he took levaquin this past time he had nausea with vomiting     Past Medical History:  Diagnosis Date   Allergy    Chronic cough    Chronic sinusitis    Diverticulitis 2018   Mild   Diverticulosis of colon (without mention of hemorrhage)    Early satiety    Esophageal motility disorder 12/2018   Family history of adverse reaction to anesthesia    mom has PONV   GERD (gastroesophageal reflux disease)    History of kidney stones    2017   History of left inguinal hernia    Hypertension    Insomnia    Lyme disease    QUESTIONABLE   Pulmonary nodules 10/2018   Scattered tiny subpleural pulmonary nodules at the peripheral right lung base, largest 3 mm   Right inguinal hernia 2017   Small fat containing right inguinal hernia   Sleep apnea    no CPAP   Sleep deficient    Testosterone deficiency    Vitamin D deficiency    Wears glasses      Health Maintenance  Topic Date Due   Pneumococcal Vaccine 66-41 Years old (1 - PCV) Never done   HIV Screening  Never done   Hepatitis C Screening  Never done   Zoster Vaccines- Shingrix (1 of 2) Never done   COVID-19 Vaccine (4 - Booster for Pfizer series) 08/18/2020   INFLUENZA VACCINE  06/07/2021   TETANUS/TDAP  09/07/2030   COLONOSCOPY  07/29/2031   HPV  VACCINES  Aged  Out     Immunization History  Administered Date(s) Administered   Influenza Inj Mdck Quad    08/29/2017, 07/30/2018, 08/14/2019, 09/07/2020   Influenza Split 08/07/2012, 09/25/2014   Influenza, Seasonal  09/25/2015   Influenza,inj,quad,   09/15/2016   PFIZER SARS-COV-2 Vacc 04/17/2020, 05/08/2020   PPD Test 07/30/2018, 08/14/2019, 09/07/2020   Td 09/07/2020   Tdap 07/08/2010    Last Colon - 07/28/2021-  Dr Ardis Hughs - Recc 10 year f/u due Sept 2032  Past Surgical History:  Procedure Laterality Date   BRAVO [place] STUDY N/A 05/02/2019   Procedure: BRAVO [place] STUDY;  Surgeon: Milus Banister, MD;  Location: [facility];  Service: Endoscopy;  Laterality: N/A;   COLONOSCOPY  02/2011   ESOPHAGOGASTRODUODENOSCOPY (EGD) WITH PROPOFOL N/A 05/02/2019   Procedure: ESOPHAGOGASTRODUODENOSCOPY (EGD) WITH PROPOFOL;  Surgeon: Milus Banister, MD;  Location: [facility];  Service: Endoscopy;  Laterality: N/A;   HERNIA REPAIR     NASAL SEPTOPLASTY W/ TURBINOPLASTY Bilateral 04/21/2021   Procedure: NASAL SEPTOPLASTY WITH  BILATERAL TURBINATE REDUCTION;  Surgeon: Leta Baptist, MD;  Location: [facility];  Service: ENT;  Laterality: Bilateral;   TOE SURGERY     INGROWN TOE NAIL, CHILDHOOD   UPPER GASTROINTESTINAL ENDOSCOPY     VASECTOMY       Family History  Problem Relation Age of Onset   Breast cancer Maternal Grandmother    Colon polyps Brother    Colon polyps Maternal Uncle    Cancer Mother 47       pancreatic   Colon cancer Neg Hx    Esophageal cancer Neg Hx    Stomach cancer Neg Hx    Ulcerative colitis Neg Hx      Social History   Tobacco Use   Smoking status: Never   Smokeless tobacco: Never  Vaping Use   Vaping Use: Never used  Substance Use Topics   Alcohol use: Yes    Alcohol/week: 2.0 - 3.0 standard drinks    Types: 2 - 3 Cans of beer per week   Drug use: No      ROS Constitutional: Denies fever, chills, weight loss/gain, headaches, insomnia,  night sweats or  change in appetite. Does c/o fatigue. Eyes: Denies redness, blurred vision, diplopia, discharge, itchy or watery eyes.  ENT: Denies discharge, congestion, post nasal drip, epistaxis, sore throat, earache, hearing loss, dental pain, Tinnitus, Vertigo, Sinus pain or snoring.  Cardio: Denies chest pain, palpitations, irregular heartbeat, syncope, dyspnea, diaphoresis, orthopnea, PND, claudication or edema Respiratory: denies cough, dyspnea, DOE, pleurisy, hoarseness, laryngitis or wheezing.  Gastrointestinal: Denies dysphagia, heartburn, reflux, water brash, pain, cramps, nausea, vomiting, bloating, diarrhea, constipation, hematemesis, melena, hematochezia, jaundice or hemorrhoids Genitourinary: Denies dysuria, frequency, urgency, nocturia, hesitancy, discharge, hematuria or flank pain Musculoskeletal: Denies arthralgia, myalgia, stiffness, Jt. Swelling, pain, limp or strain/sprain. Denies Falls. Skin: Denies puritis, rash, hives, warts, acne, eczema or change in skin lesion Neuro: No weakness, tremor, incoordination, spasms, paresthesia or pain Psychiatric: Denies confusion, memory loss or sensory loss. Denies Depression. Endocrine: Denies change in weight, skin, hair change, nocturia, and paresthesia, diabetic polys, visual blurring or hyper / hypo glycemic episodes.  Heme/Lymph: No excessive bleeding, bruising or enlarged lymph nodes.   Physical Exam  BP 114/70   Pulse (!) 56   Temp 97.9 F (36.6 C)   Resp 16   Ht 5\' 9"  (1.753 m)   Wt 172 lb (78 kg)   SpO2 98%   BMI 25.40 kg/m   General Appearance: Well nourished and  well groomed and in no apparent distress.  Eyes: PERRLA, EOMs, conjunctiva no swelling or erythema, normal fundi and vessels. Sinuses: No frontal/maxillary tenderness ENT/Mouth: EACs patent / TMs  nl. Nares clear without erythema, swelling, mucoid exudates. Oral hygiene is good. No erythema, swelling, or exudate. Tongue normal, non-obstructing. Tonsils not swollen or  erythematous. Hearing normal.  Neck: Supple, thyroid not palpable. No bruits, nodes or JVD. Respiratory: Respiratory effort normal.  BS equal and clear bilateral without rales, rhonci, wheezing or stridor. Cardio: Heart sounds are normal with regular rate and rhythm and no murmurs, rubs or gallops. Peripheral pulses are normal and equal bilaterally without edema. No aortic or femoral bruits. Chest: symmetric with normal excursions and percussion.  Abdomen: Soft, with Nl bowel sounds. Nontender, no guarding, rebound, hernias, masses, or organomegaly.  Lymphatics: Non tender without lymphadenopathy.  Musculoskeletal: Full ROM all peripheral extremities, joint stability, 5/5 strength, and normal gait. Skin: Warm and dry without rashes, lesions, cyanosis, clubbing or  ecchymosis.  Neuro: Cranial nerves intact, reflexes equal bilaterally. Normal muscle tone, no cerebellar symptoms. Sensation intact.  Pysch: Alert and oriented X 3 with normal affect, insight and judgment appropriate.   Assessment and Plan  1. Annual Preventative/Screening Exam    2. Essential hypertension  - EKG 12-Lead - [place], RETROPERITNL ABD,  LTD - Urinalysis, Routine w reflex microscopic - Microalbumin / creatinine urine ratio - CBC with Differential/Platelet - COMPLETE METABOLIC PANEL WITH GFR - Magnesium - TSH  3. Hyperlipidemia, mixed  - EKG 12-Lead - [place], RETROPERITNL ABD,  LTD - Lipid panel - TSH  4. Abnormal glucose  - EKG 12-Lead - [place], RETROPERITNL ABD,  LTD - Hemoglobin A1c - Insulin, random  5. Vitamin D deficiency  - VITAMIN D 25 Hydroxy   6. Testosterone Deficiency  - Testosterone  7. OSA on CPAP   8. Screening-pulmonary TB  - TB Skin Test  9. Prostate cancer screening  - PSA  10. BPH with obstruction/lower urinary tract symptoms  - PSA  11. Screening for ischemic heart disease  - EKG 12-Lead  12. FHx: heart disease  - EKG 12-Lead - [place], RETROPERITNL ABD,  LTD  13.  Screening for AAA (aortic abdominal aneurysm)  - [place], RETROPERITNL ABD,  LTD  14. Fatigue, unspecified type  - Iron, Total/Total Iron Binding Cap - Vitamin B12 - Testosterone - CBC with Differential/Platelet - TSH  15. Medication management  - Urinalysis, Routine w reflex microscopic - Microalbumin / creatinine urine ratio - POC Hemoccult Bld/Stl (3-Cd Home Screen); Future - CBC with Differential/Platelet - COMPLETE METABOLIC PANEL WITH GFR - Magnesium - Lipid panel - TSH - Hemoglobin A1c - Insulin, random - VITAMIN D 25 Hydroxy   16. Need for immunization against influenza  - Flu Vaccine QUAD 6+ mos PF IM (Fluarix Quad PF)  17. Visit for TB skin test          Patient was counseled in prudent diet, weight control to achieve/maintain BMI less than 25, BP monitoring, regular exercise and medications as discussed.  Discussed med effects and SE's. Routine screening labs and tests as requested with regular follow-up as recommended. Over 40 minutes of exam, counseling, chart review and high complex critical decision making was performed   Kirtland Bouchard, MD

## 2021-09-14 ENCOUNTER — Encounter: Payer: Self-pay | Admitting: Internal Medicine

## 2021-09-14 ENCOUNTER — Ambulatory Visit (INDEPENDENT_AMBULATORY_CARE_PROVIDER_SITE_OTHER): Payer: 59 | Admitting: Internal Medicine

## 2021-09-14 ENCOUNTER — Other Ambulatory Visit: Payer: Self-pay

## 2021-09-14 VITALS — BP 114/70 | HR 56 | Temp 97.9°F | Resp 16 | Ht 69.0 in | Wt 172.0 lb

## 2021-09-14 DIAGNOSIS — I10 Essential (primary) hypertension: Secondary | ICD-10-CM

## 2021-09-14 DIAGNOSIS — E559 Vitamin D deficiency, unspecified: Secondary | ICD-10-CM

## 2021-09-14 DIAGNOSIS — R0989 Other specified symptoms and signs involving the circulatory and respiratory systems: Secondary | ICD-10-CM | POA: Diagnosis not present

## 2021-09-14 DIAGNOSIS — N401 Enlarged prostate with lower urinary tract symptoms: Secondary | ICD-10-CM

## 2021-09-14 DIAGNOSIS — Z8249 Family history of ischemic heart disease and other diseases of the circulatory system: Secondary | ICD-10-CM

## 2021-09-14 DIAGNOSIS — Z111 Encounter for screening for respiratory tuberculosis: Secondary | ICD-10-CM

## 2021-09-14 DIAGNOSIS — Z0000 Encounter for general adult medical examination without abnormal findings: Secondary | ICD-10-CM

## 2021-09-14 DIAGNOSIS — Z0001 Encounter for general adult medical examination with abnormal findings: Secondary | ICD-10-CM

## 2021-09-14 DIAGNOSIS — G4733 Obstructive sleep apnea (adult) (pediatric): Secondary | ICD-10-CM

## 2021-09-14 DIAGNOSIS — R7309 Other abnormal glucose: Secondary | ICD-10-CM

## 2021-09-14 DIAGNOSIS — N138 Other obstructive and reflux uropathy: Secondary | ICD-10-CM

## 2021-09-14 DIAGNOSIS — Z79899 Other long term (current) drug therapy: Secondary | ICD-10-CM

## 2021-09-14 DIAGNOSIS — Z23 Encounter for immunization: Secondary | ICD-10-CM | POA: Diagnosis not present

## 2021-09-14 DIAGNOSIS — R5383 Other fatigue: Secondary | ICD-10-CM

## 2021-09-14 DIAGNOSIS — E782 Mixed hyperlipidemia: Secondary | ICD-10-CM

## 2021-09-14 DIAGNOSIS — Z125 Encounter for screening for malignant neoplasm of prostate: Secondary | ICD-10-CM

## 2021-09-14 DIAGNOSIS — Z136 Encounter for screening for cardiovascular disorders: Secondary | ICD-10-CM

## 2021-09-14 DIAGNOSIS — E291 Testicular hypofunction: Secondary | ICD-10-CM

## 2021-09-14 NOTE — Patient Instructions (Signed)

## 2021-09-15 NOTE — Progress Notes (Signed)
============================================================ -   Test results slightly outside the reference range are not unusual. If there is anything important, I will review this with you,  otherwise it is considered normal test values.  If you have further questions,  please do not hesitate to contact me at the office or via My Chart.  ============================================================ ============================================================  -  Iron Level -  Normal  ============================================================ ============================================================  - Vitamin B12  level -  Normal  ============================================================ ============================================================  -  PSA - very Low - Great ! ============================================================ ============================================================  -  Testosterone low - be sure taking Zinc as it helps                                                                       raise Testosterone levels naturally.   - Also Daily exercise helps raise Testosterone levels.  ============================================================ ============================================================  -  CBC shows borderline mild anemia - will monitor at subsequent office visits ============================================================ ============================================================  -  Total Chol = 162    & LDL Chol = 98   - Both  Excellent   - Very low risk for Heart Attack  / Stroke ============================================================ ============================================================  -  A1c - Normal - No Diabetes   - Great ! ============================================================ ============================================================  -  Vitamin D = 93  - Excellent   - Vitamin D goal  is between 70-100.   - It is very important as a natural anti-inflammatory and helping the  immune system protect against viral infections, like the Covid-19   helping hair, skin, and nails, as well as reducing stroke and  heart attack risk.   - It helps your bones and helps with mood.  - It also decreases numerous cancer risks so please  take it as directed.   - Low Vit D is associated with a 200-300% higher risk for  CANCER   and 200-300% higher risk for HEART   ATTACK  &  STROKE.    - It is also associated with higher death rate at younger ages,   autoimmune diseases like Rheumatoid arthritis, Lupus,  Multiple Sclerosis.     - Also many other serious conditions, like Depression, Alzheimer's  Dementia, infertility, muscle aches, fatigue, fibromyalgia   - just to name a few. ============================================================ ============================================================  -  All Else - CBC - Kidneys -  U/A - Electrolytes - Liver - Magnesium & Thyroid    - all  Normal / OK ===========================================================

## 2021-09-16 LAB — PSA: PSA: 0.92 ng/mL (ref <=4.00)

## 2021-09-16 LAB — URINALYSIS, ROUTINE W REFLEX MICROSCOPIC
Bilirubin Urine: NEGATIVE
Glucose, UA: NEGATIVE
Hgb urine dipstick: NEGATIVE
Ketones, ur: NEGATIVE
Leukocytes,Ua: NEGATIVE
Nitrite: NEGATIVE
Protein, ur: NEGATIVE
Specific Gravity, Urine: 1.023 (ref 1.001–1.035)
pH: 5.5 (ref 5.0–8.0)

## 2021-09-16 LAB — CBC WITH DIFFERENTIAL/PLATELET
Absolute Monocytes: 494 cells/uL (ref 200–950)
Basophils Absolute: 19 cells/uL (ref 0–200)
Basophils Relative: 0.4 %
Eosinophils Absolute: 42 cells/uL (ref 15–500)
Eosinophils Relative: 0.9 %
HCT: 37.5 % — ABNORMAL LOW (ref 38.5–50.0)
Hemoglobin: 12.9 g/dL — ABNORMAL LOW (ref 13.2–17.1)
Lymphs Abs: 1227 cells/uL (ref 850–3900)
MCH: 31.9 pg (ref 27.0–33.0)
MCHC: 34.4 g/dL (ref 32.0–36.0)
MCV: 92.8 fL (ref 80.0–100.0)
MPV: 9.6 fL (ref 7.5–12.5)
Monocytes Relative: 10.5 %
Neutro Abs: 2919 cells/uL (ref 1500–7800)
Neutrophils Relative %: 62.1 %
Platelets: 168 10*3/uL (ref 140–400)
RBC: 4.04 10*6/uL — ABNORMAL LOW (ref 4.20–5.80)
RDW: 12.7 % (ref 11.0–15.0)
Total Lymphocyte: 26.1 %
WBC: 4.7 10*3/uL (ref 3.8–10.8)

## 2021-09-16 LAB — HEMOGLOBIN A1C
Hgb A1c MFr Bld: 5 % of total Hgb (ref <5.7)
Mean Plasma Glucose: 97 mg/dL
eAG (mmol/L): 5.4 mmol/L

## 2021-09-16 LAB — LIPID PANEL
Cholesterol: 162 mg/dL (ref <200)
HDL: 36 mg/dL — ABNORMAL LOW (ref >=40)
LDL Cholesterol (Calc): 98 mg/dL (calc)
Non-HDL Cholesterol (Calc): 126 mg/dL (calc) (ref <130)
Total CHOL/HDL Ratio: 4.5 (calc) (ref <5.0)
Triglycerides: 187 mg/dL — ABNORMAL HIGH (ref <150)

## 2021-09-16 LAB — TESTOSTERONE: Testosterone: 237 ng/dL — ABNORMAL LOW (ref 250–827)

## 2021-09-16 LAB — COMPLETE METABOLIC PANEL WITH GFR
AG Ratio: 1.8 (calc) (ref 1.0–2.5)
ALT: 19 U/L (ref 9–46)
AST: 25 U/L (ref 10–35)
Albumin: 4.4 g/dL (ref 3.6–5.1)
Alkaline phosphatase (APISO): 56 U/L (ref 35–144)
BUN: 20 mg/dL (ref 7–25)
CO2: 26 mmol/L (ref 20–32)
Calcium: 9 mg/dL (ref 8.6–10.3)
Chloride: 106 mmol/L (ref 98–110)
Creat: 1.03 mg/dL (ref 0.70–1.35)
Globulin: 2.5 g/dL (calc) (ref 1.9–3.7)
Glucose, Bld: 91 mg/dL (ref 65–99)
Potassium: 4.2 mmol/L (ref 3.5–5.3)
Sodium: 141 mmol/L (ref 135–146)
Total Bilirubin: 0.5 mg/dL (ref 0.2–1.2)
Total Protein: 6.9 g/dL (ref 6.1–8.1)
eGFR: 82 mL/min/{1.73_m2} (ref >=60)

## 2021-09-16 LAB — INSULIN, RANDOM: Insulin: 6 u[IU]/mL

## 2021-09-16 LAB — MAGNESIUM: Magnesium: 2.1 mg/dL (ref 1.5–2.5)

## 2021-09-16 LAB — TSH: TSH: 2.4 mIU/L (ref 0.40–4.50)

## 2021-09-16 LAB — MICROALBUMIN / CREATININE URINE RATIO
Creatinine, Urine: 212 mg/dL (ref 20–320)
Microalb Creat Ratio: 2 mcg/mg creat (ref <30)
Microalb, Ur: 0.4 mg/dL

## 2021-09-16 LAB — VITAMIN D 25 HYDROXY (VIT D DEFICIENCY, FRACTURES): Vit D, 25-Hydroxy: 93 ng/mL (ref 30–100)

## 2021-09-16 LAB — VITAMIN B12: Vitamin B-12: 539 pg/mL (ref 200–1100)

## 2021-09-16 LAB — IRON, TOTAL/TOTAL IRON BINDING CAP
%SAT: 25 % (calc) (ref 20–48)
Iron: 70 ug/dL (ref 50–180)
TIBC: 282 mcg/dL (calc) (ref 250–425)

## 2021-09-17 ENCOUNTER — Other Ambulatory Visit: Payer: Self-pay | Admitting: Internal Medicine

## 2021-09-17 LAB — TB SKIN TEST
Induration: 0 mm
TB Skin Test: NEGATIVE

## 2021-09-17 MED ORDER — ZINC 50 MG PO TABS: ORAL_TABLET | ORAL | 0 refills | Status: AC

## 2021-09-23 ENCOUNTER — Other Ambulatory Visit: Payer: Self-pay | Admitting: Nurse Practitioner

## 2021-09-23 DIAGNOSIS — J302 Other seasonal allergic rhinitis: Secondary | ICD-10-CM

## 2021-10-07 ENCOUNTER — Encounter: Payer: Self-pay | Admitting: Internal Medicine

## 2021-10-14 ENCOUNTER — Encounter: Payer: Self-pay | Admitting: Internal Medicine

## 2021-11-01 ENCOUNTER — Other Ambulatory Visit: Payer: Self-pay | Admitting: Nurse Practitioner

## 2021-11-01 DIAGNOSIS — R0989 Other specified symptoms and signs involving the circulatory and respiratory systems: Secondary | ICD-10-CM

## 2021-11-01 DIAGNOSIS — R519 Headache, unspecified: Secondary | ICD-10-CM

## 2021-11-10 ENCOUNTER — Encounter: Payer: Self-pay | Admitting: Neurology

## 2021-11-11 ENCOUNTER — Encounter: Payer: Self-pay | Admitting: Neurology

## 2021-11-11 ENCOUNTER — Other Ambulatory Visit: Payer: Self-pay

## 2021-11-11 ENCOUNTER — Ambulatory Visit: Payer: 59 | Admitting: Neurology

## 2021-11-11 VITALS — BP 122/79 | HR 54 | Ht 69.0 in | Wt 175.0 lb

## 2021-11-11 DIAGNOSIS — Z0289 Encounter for other administrative examinations: Secondary | ICD-10-CM

## 2021-11-11 DIAGNOSIS — Z9889 Other specified postprocedural states: Secondary | ICD-10-CM

## 2021-11-11 DIAGNOSIS — G4733 Obstructive sleep apnea (adult) (pediatric): Secondary | ICD-10-CM | POA: Diagnosis not present

## 2021-11-11 DIAGNOSIS — Z789 Other specified health status: Secondary | ICD-10-CM | POA: Diagnosis not present

## 2021-11-11 DIAGNOSIS — F5104 Psychophysiologic insomnia: Secondary | ICD-10-CM | POA: Diagnosis not present

## 2021-11-11 MED ORDER — TRAZODONE HCL 50 MG PO TABS: 50.0000 mg | ORAL_TABLET | Freq: Every evening | ORAL | 3 refills | Status: DC | PRN

## 2021-11-11 NOTE — Progress Notes (Signed)
SLEEP MEDICINE CLINIC    Provider:  Larey Seat, MD  Primary Care Physician:  Unk Pinto, MD [address] [place] [address]     Referring Provider: Unk Pinto, [place],  [place] [address]          Chief Complaint according to patient   Patient presents with:     New Patient (Initial Visit)     pt alone, rm 10. presents today for concerns of chronic sleep concerns and [name] .  ENT, completed post op visit and things were doing well. Here to discuss inspire. He has not completed the repeat HST post ENT procedure. He is looking into and getting more information on dental device         RV after ENT surgery and HST - now using a dental device - 11-11-2021.   FAA criteria to be met for this patient with residual mild apnea- Patient saw Dr. Benjamine Mola in follow up after ENT surgery, for nasal airway patency and should be healed enough to try CPAP -if still needed. [name] had undergone a home sleep test 12 months ago which documented an AHI of 31.3 at the time and was strongly dependent on supine sleep position. [name] ENT surgery has been reducing the AHI significantly- only MILD APNEA was LEFT-This gave this patient several options: 1)  The patient to free to try auto CPAP with a nasal pillow, mainly because this is still very REM dominant apnea.  2) Alternatively, if CPAP intolerant, he can be using inspire to reduce apnea, a dental device can reduce apnea, and as long as he sleeps on his side- he has a right lateral sleep position AHI of only 7.6/h.  The patient decided on the dental device and tries to avoid supine sleep . His dental device by DDS Dr Ronnald Ramp was implemented and is still in titration. He is sensitive to the tightening of the mandibular advancement and tolerates 1/4 turn a week, about 1 mm.   We have a form to sign for coverage by health insurance.  He now uses a fit bit- there is  indication of fragmented sleep until 1 Am and then about 5-6 hours of more sleep most nights- not bad!    Epworth Sleepiness Score 8/ 24, FSS at 15/ 63       HISTORY OF PRESENT ILLNESS:    05-19-2021:  Jay Brooks is a 61 y.o. year old  Caucasian male patient seen here In a RV 11/11/2021 : [name] just underwent a nasal septoplasty with bilateral turbinate reduction I have the detailed note from Dr. Flossie Buffy dated 04-21-2021 and he recently saw the surgeon again and has been told that he is 90% healed at this time.  If his nasal patency is significantly improved we should retest him if apnea is still present.  Sleep apnea is a symptom of mouth breathing patient's and CPAP helps to keep an air splint open in the back of the throat and allowing airflow this way CPAP also relies on nasal passage to be effectively applying pressure.  So at this time I would like to know where the new apnea degree may be after the surgery and if we need to continue with treatment of apnea or if a dental device can then be used instead.  So I will order a home sleep test as a control test I am booked out for at least 3  weeks so it will be enough to work sleep early August weeks probably.  And I like for the patient to be completely healed before we would reinitiate any CPAP should that be the recommendation of the sleep study.   Chronic insomnia , previous evaluations have not found an organic cause.  I would recommend a cognitive behaviour therapy if our repeat sleep study gives similar results.  Sinus CT was normal. Dr Benjamine Mola.  FAA - he is required to treat his apnea- and can't tolerate CPAP- worsened his Insomnia. He only can use it 4 hours at night- FAA wants to see 6 hours at night.  He does not take sleep aids that were provided- he has not been compliant with either. He cannot initiate sleep- !      07-15-2020 The patient underwent a home sleep test by watch pat this was his third sleep study ,  This home sleep  test documented an AHI of 31.3 strongly dependent on supine sleep position also sleeping on your back.  Sleep he usually avoids that at home, there was not much REM sleep dependency noted there was no clinical significant hypoxemia noted and he had a normal pulse range variability between 45 and 110 bpm this is entirely normal for age and gender.  Since a home sleep test that the documented severe OSA he just tipped the 13th apnea marked without any central apneas.  He felt that CPAP would be the best option however CPAP has not been helpful with his second main sleep disorder chronic insomnia.  His AHI is compliant use of CPAP has been 0.7 so this is a very significant reduction the 95th percentile pressure he needs is only 6 cmH2O minimum pressure was set at 5 maximum pressure at 15 and median usage on days used is just 4 hours 2 minutes.  However there were several days where the machine was not used at all.  What I like to do is he has tried several masks but has not found any of them very comfortable, his medication list is fairly unchanged.  He endorsed the Epworth sleepiness score at 3 points and the fatigue severity at 9 points.   My question for the patient is if he would rather consider an inspire device than using CPAP.  He reports being unhappy with CPAP.       See first 80-02 2021 from dr. Melford Aase, his PCP.  Chief concern according to patient :  I can't fall asleep and have trouble with fatigue " . As long as a stay active and stimulated, I wont get sleepy.  I yawn a lot while driving"   I have the pleasure of seeing Jay Brooks , a right handed  Caucasian male with a possible chronic sleep disorder.  he developed a chronic insomnia, chronic cough, head pressure, lightheadedness.  He  has a past medical history of Allergy, Chronic cough, Chronic sinusitis, Diverticulitis (2018), Diverticulosis of colon (without mention of hemorrhage), Early satiety, Esophageal motility disorder  (12/2018), Family history of adverse reaction to anesthesia, GERD (gastroesophageal reflux disease), History of kidney stones, History of left inguinal hernia, Hypertension, Insomnia, Lyme disease, Pulmonary nodules (10/2018), Right inguinal hernia (2017), Sleep apnea, Sleep deficient, Testosterone deficiency, Vitamin D deficiency, and Wears glasses.    He has chronic insomnia problems, he uses Benadryl. He has a history of thrid shift work in 1989- for 8 years- -  He has a stuffy nose in the evening hours, not in daytime ,  uses afrin and benadryl. He flies all  domestic, [address], [place]) The patient had the first sleep study in the year 2005 with a negative result of "no apnea present"    Sleep relevant medical history: Nocturia once, no Tonsillectomy. Head injury in childhood.   Family medical /sleep history:mother died of pancreatic cancer.  no other family member with insomnia, maternal uncle was obese and had OSA.   Social history:  Patient is working as a Occupational hygienist and out-of work since 08-2018 and lives in a household with spouse and a college age daughter is home right now. Family status is married  With one daughter. The patient currently is out- of work.  Pets are present. One cat.  Tobacco use: none .  ETOH use : seldomly ,  Caffeine intake in form of Coffee( in AM ) Soda( lunchtime) Tea ( afternoon ) no energy drinks. Regular exercise none.   Sleep habits are as follows: The patient's dinner time is between 6 PM. The patient goes to bed at 11.30 PM and he often sleeps not before midnight. continues to sleep for 4-6 hours, wakes for one bathroom break.   The bedroom is cool, quiet and dark- wife snores. TV is not used .  The preferred sleep position is right sided. , with the support of 1 pillow. Dreams are reportedly rare. He sometimes yells. 6-7  AM is the usual rise time. The patient wakes up spontaneously.  He  reports not feeling refreshed or restored in AM, with symptoms such as  dry mouth, morning headaches and sinus pressure , and residual fatigue.  Naps are taken infrequently.     Review of Systems: Out of a complete 14 system review, the patient complains of only the following symptoms, and all other reviewed systems are negative.:  Fatigue, sleepiness , snoring, fragmented sleep, Insomnia.   How likely are you to doze in the following situations: 0 = not likely, 1 = slight chance, 2 = moderate chance, 3 = high chance   Sitting and Reading? Watching Television? Sitting inactive in a public place (theater or meeting)? As a passenger in a car for an hour without a break? Lying down in the afternoon when circumstances permit? Sitting and talking to someone? Sitting quietly after lunch without alcohol? In a car, while stopped for a few minutes in traffic?   Total = 8/ 24 points   FSS endorsed at  15 - down from 38/ 63 points.   Social History   Socioeconomic History   Marital status: Married    Spouse name: Not on file   Number of children: 1   Years of education: Not on file   Highest education level: Not on file  Occupational History   Occupation: PILOT    Employer: [employer]  Tobacco Use   Smoking status: Never   Smokeless tobacco: Never  Vaping Use   Vaping Use: Never used  Substance and Sexual Activity   Alcohol use: Yes    Alcohol/week: 2.0 - 3.0 standard drinks    Types: 2 - 3 Cans of beer per week   Drug use: No   Sexual activity: Not on file  Other Topics Concern   Not on file  Social History Narrative   Daily caffeine    Social Determinants of Health   Financial Resource Strain: Not on file  Food Insecurity: Not on file  Transportation Needs: Not on file  Physical Activity: Not on file  Stress: Not on file  Social Connections: Not on file    Family History  Problem Relation Age of Onset   Breast cancer Maternal Grandmother    Colon polyps Brother    Colon polyps Maternal Uncle    Cancer Mother 30        pancreatic   Colon cancer Neg Hx    Esophageal cancer Neg Hx    Stomach cancer Neg Hx    Ulcerative colitis Neg Hx     Past Medical History:  Diagnosis Date   Allergy    Chronic cough    Chronic sinusitis    Diverticulitis 2018   Mild   Diverticulosis of colon (without mention of hemorrhage)    Early satiety    Esophageal motility disorder 12/2018   Family history of adverse reaction to anesthesia    mom has PONV   GERD (gastroesophageal reflux disease)    History of kidney stones    2017   History of left inguinal hernia    Hypertension    Insomnia    Lyme disease    QUESTIONABLE   Pulmonary nodules 10/2018   Scattered tiny subpleural pulmonary nodules at the peripheral right lung base, largest 3 mm   Right inguinal hernia 2017   Small fat containing right inguinal hernia   Sleep apnea    no CPAP   Sleep deficient    Testosterone deficiency    Vitamin D deficiency    Wears glasses     Past Surgical History:  Procedure Laterality Date   BRAVO [facility] [place] STUDY N/A 05/02/2019   Procedure: BRAVO [place] STUDY;  Surgeon: Milus Banister, MD;  Location: [facility];  Service: Endoscopy;  Laterality: N/A;   COLONOSCOPY  02/2011   ESOPHAGOGASTRODUODENOSCOPY (EGD) WITH PROPOFOL N/A 05/02/2019   Procedure: ESOPHAGOGASTRODUODENOSCOPY (EGD) WITH PROPOFOL;  Surgeon: Milus Banister, MD;  Location: [facility];  Service: Endoscopy;  Laterality: N/A;   HERNIA REPAIR     NASAL SEPTOPLASTY W/ TURBINOPLASTY Bilateral 04/21/2021   Procedure: NASAL SEPTOPLASTY WITH  BILATERAL TURBINATE REDUCTION;  Surgeon: Leta Baptist, MD;  Location: [facility] OR;  Service: ENT;  Laterality: Bilateral;   TOE SURGERY     INGROWN TOE NAIL, CHILDHOOD   UPPER GASTROINTESTINAL ENDOSCOPY     VASECTOMY       Current Outpatient Medications on File Prior to Visit  Medication Sig Dispense Refill   Azelastine HCl 137 MCG/SPRAY SOLN SMARTSIG:2 Spray(s) Both Nares Every Evening     bisoprolol (ZEBETA) 10 MG tablet TAKE 1/2 TO  1 TABLET DAILY FOR BLOOD PRESSURE 90 tablet 3   calcium carbonate (TUMS EX) 750 MG chewable tablet Chew 1 tablet by mouth daily.     cholecalciferol (VITAMIN D) 25 MCG (1000 UNIT) tablet Take 1,000 Units by mouth daily.     clindamycin (CLEOCIN T) 1 % lotion Apply 1 application topically every evening.     famotidine (PEPCID) 20 MG tablet Take 20 mg by mouth daily.     fluticasone (FLONASE) 50 MCG/ACT nasal spray Place 2 sprays into both nostrils daily.      hydrocortisone cream 1 % Apply 1 application topically daily as needed for itching.     ipratropium (ATROVENT) 0.06 % nasal spray SMARTSIG:1 Spray(s) Both Nares Twice Daily PRN     ketotifen (ZADITOR) 0.025 % ophthalmic solution Uses prn 5 mL 0   loratadine (CLARITIN) 10 MG tablet Take 10 mg by mouth daily.     montelukast (SINGULAIR) 10 MG tablet TAKE 1 TABLET BY MOUTH DAILY FOR  ALLERGIES 30 tablet 3   olmesartan (BENICAR) 20 MG tablet Take 1 tablet at Bedtime for BP                           /            TAKE 1/2 TO 1 (ONE-HALF TO ONE) TABLET BY MOUTH 90 tablet 3   Olopatadine HCl 0.2 % SOLN Place 1 drop into both eyes daily as needed.     Omega-3 1000 MG CAPS Take 1,000 mg by mouth daily.     OVER THE COUNTER MEDICATION Take 2 capsules by mouth at bedtime. Serenagen otc supplement     RABEprazole (ACIPHEX) 20 MG tablet Take 1 tablet (20 mg total) by mouth 2 (two) times daily. 180 tablet 1   tadalafil (CIALIS) 5 MG tablet Take 5 mg by mouth in the morning.     tamsulosin (FLOMAX) 0.4 MG CAPS capsule TAKE 1 CAPSULE AT BEDTIME FOR PROSTATE (Patient taking differently: Take 0.4 mg by mouth at bedtime.) 90 capsule 2   triamcinolone cream (KENALOG) 0.1 % Apply 1 application topically 2 (two) times daily as needed for itching.     Zinc 50 MG TABS Take 1 tablet Daily  0   No current facility-administered medications on file prior to visit.    Allergies  Allergen Reactions   Allegra [Fexofenadine] Other (See Comments)    Worsened insomnia    Benzoyl Peroxide     blisters   Decadron [Dexamethasone]     Reflux & Hiccups, GI upset   Doxycycline Nausea Only   Levaquin [Levofloxacin] Nausea And Vomiting    Patient states that after he took levaquin this past time he had nausea with vomiting    Physical exam:  Today's Vitals   11/11/21 1027  BP: 122/79  Pulse: (!) 54  Weight: 175 lb (79.4 kg)  Height: $Remove'5\' 9"'khAUyBf$  (1.753 m)   Body mass index is 25.84 kg/m.   Wt Readings from Last 3 Encounters:  11/11/21 175 lb (79.4 kg)  09/14/21 172 lb (78 kg)  07/28/21 174 lb (78.9 kg)     Ht Readings from Last 3 Encounters:  11/11/21 $RemoveB'5\' 9"'OyJWHfqE$  (1.753 m)  09/14/21 $RemoveB'5\' 9"'PtdDdloI$  (1.753 m)  07/28/21 $RemoveB'5\' 9"'qlzfZIyV$  (1.753 m)      General: The patient is awake, alert and appears not in acute distress. The patient is well groomed. Head: Normocephalic, atraumatic. Neck is supple. Mallampati 2- very exaggerated gag reflex. ,  neck circumference: 17 inches . Nasal airflow patent.  Retrognathia is  seen.   Cardiovascular:  Regular rate and cardiac rhythm by pulse,  without distended neck veins. Respiratory: Lungs are clear to auscultation.  Skin:  Without evidence of ankle edema, or rash. Trunk: The patient's posture is erect.   Neurologic exam : The patient is awake and alert, oriented to place and time.   Memory subjective described as intact.  Attention span & concentration ability appears normal.  Speech is fluent,  without  dysarthria, dysphonia or aphasia.  Mood and affect are appropriate.   Cranial nerves: no loss of smell or taste reported  Pupils are equal and briskly reactive to light. Funduscopic exam deferred.  Extraocular movements in vertical and horizontal planes were intact and without nystagmus. No Diplopia. senistivity to sinus pressure points around the orbit. Facial motor strength is symmetric and tongue and uvula move midline.  Neck ROM : rotation, tilt and flexion extension were normal for age  and shoulder shrug was symmetrical.     Motor exam:  Symmetric bulk, tone and ROM.   Normal tone without cog wheeling, symmetric grip strength .   Sensory:  Fine touch, pinprick and vibration were tested  and  normal.  Coordination: no evidence of ataxia, dysmetria or tremor. Gait and station: Patient could rise unassisted from a seated position, walked without assistive device.  Deep tendon reflexes: in the upper and lower extremities are symmetric and intact.  Babinski response was deferred.       After spending a total time of 25 minutes face to face and additional time for physical and neurologic examination, review of laboratory studies,  personal review of imaging studies, reports and results of other testing and review of referral information / records as far as provided in visit, I have established the following assessments:   Insomnia is sleep initiation insomnia - Trazodone is still an option.Marland KitchenApnea treatment is now by dental device , early in the course. Here to f/u on dental device. Dr Ronnald Ramp made his dental appliance about a month ago, has been making adjustment but not noticed improvements yet. Pt will need a repeat SS to show improvements for work. This will be ordered for March 2023.     My Plan is to proceed with:  Repeat HST march 2023 or attended sleep study with dental device for titration.  We decided on attended sleep study for titration.   I would like to thank Dr Benjamine Mola, and Unk Pinto, [address] [place],  [place] [address] for allowing me to meet with and to take care of this pleasant patient.    Electronically signed by: Larey Seat, MD 11/11/2021 11:06 AM  [facility] Board certified by The [employer] of Sleep Medicine and Diplomate of the [employer] of Sleep Medicine. Board certified In Neurology through the [place], Fellow of the [employer] of Neurology. Medical Director of [facility].

## 2021-11-11 NOTE — Patient Instructions (Signed)
[facility] registered patient needing dental device for apnea treatment, returns for attended titration of device.  RV in April 2023.

## 2021-11-15 ENCOUNTER — Other Ambulatory Visit: Payer: Self-pay

## 2021-11-15 DIAGNOSIS — Z79899 Other long term (current) drug therapy: Secondary | ICD-10-CM

## 2021-11-15 DIAGNOSIS — Z1212 Encounter for screening for malignant neoplasm of rectum: Secondary | ICD-10-CM | POA: Diagnosis not present

## 2021-11-15 DIAGNOSIS — Z1211 Encounter for screening for malignant neoplasm of colon: Secondary | ICD-10-CM | POA: Diagnosis not present

## 2021-11-15 LAB — POC HEMOCCULT BLD/STL (HOME/3-CARD/SCREEN)
Card #2 Fecal Occult Blod, POC: NEGATIVE
Card #3 Fecal Occult Blood, POC: NEGATIVE
Fecal Occult Blood, POC: NEGATIVE

## 2021-11-16 ENCOUNTER — Encounter: Payer: Self-pay | Admitting: Neurology

## 2021-11-18 ENCOUNTER — Telehealth: Payer: Self-pay | Admitting: *Deleted

## 2021-11-18 NOTE — Telephone Encounter (Signed)
Pt Physician form @ front desk for p/u.

## 2021-12-27 ENCOUNTER — Other Ambulatory Visit: Payer: Self-pay

## 2021-12-27 ENCOUNTER — Ambulatory Visit (INDEPENDENT_AMBULATORY_CARE_PROVIDER_SITE_OTHER): Payer: 59 | Admitting: Neurology

## 2021-12-27 ENCOUNTER — Encounter: Payer: Self-pay | Admitting: Neurology

## 2021-12-27 VITALS — BP 147/79 | HR 62 | Ht 69.0 in | Wt 176.0 lb

## 2021-12-27 DIAGNOSIS — G4733 Obstructive sleep apnea (adult) (pediatric): Secondary | ICD-10-CM | POA: Diagnosis not present

## 2021-12-27 DIAGNOSIS — Z789 Other specified health status: Secondary | ICD-10-CM | POA: Diagnosis not present

## 2021-12-27 DIAGNOSIS — Z9889 Other specified postprocedural states: Secondary | ICD-10-CM | POA: Diagnosis not present

## 2021-12-27 DIAGNOSIS — Z0289 Encounter for other administrative examinations: Secondary | ICD-10-CM

## 2021-12-27 DIAGNOSIS — F5104 Psychophysiologic insomnia: Secondary | ICD-10-CM | POA: Diagnosis not present

## 2021-12-27 NOTE — Progress Notes (Signed)
SLEEP MEDICINE CLINIC    Provider:  Larey Seat, MD  Primary Care Physician:  Unk Pinto, MD [address] [place] [address]     Referring Provider: Unk Pinto, [place] [place],  [place] [address]          Chief Complaint according to patient   Patient presents with:     New Patient (Initial Visit)     pt alone, rm 10. presents today for concerns of chronic sleep concerns and FAA regulations- . on dental device       RV after ENT surgery and HST - now using a dental device -  he reports now on 12-27-2021 that he is fatigued and sleeps just as good or poorly as he did without.  Has less apprehension about going to sleep since he is on dental device. Trazodone hasn't worked, insomnia remains a problem.  He has trouble to keep his eyes open in daytime.  He wonders how far he needs to adjust his dental device/ he was left to his own device- reached 3.5 mm total by now- 0.5 mm per week. I like to offer the HST , and have him wear the device while  recording.  He is now followed by Dr Toy Cookey, DDS, and uses a HERBST.       11-11-2021.   FAA criteria to be met for this patient with residual mild apnea- Patient saw Dr. Benjamine Mola in follow up after ENT surgery, for nasal airway patency and should be healed enough to try CPAP -if still needed. Mr. Grieshop had undergone a home sleep test 12 months ago which documented an AHI of 31.3 at the time and was strongly dependent on supine sleep position. Mr Crear's ENT surgery has been reducing the AHI significantly- only MILD APNEA was LEFT-This gave this patient several options: 1)  The patient to free to try auto CPAP with a nasal pillow, mainly because this is still very REM dominant apnea.  2) Alternatively, if CPAP intolerant, he can be using inspire to reduce apnea, a dental device can reduce apnea, and as long as he sleeps on his side- he has a right lateral sleep position AHI of  only 7.6/h.  The patient decided on the dental device and tries to avoid supine sleep . His dental device by DDS Dr Ronnald Ramp was implemented and is still in titration. He is sensitive to the tightening of the mandibular advancement and tolerates 1/4 turn a week, about 1 mm.   We have a form to sign for coverage by health insurance.  He now uses a fit bit- there is indication of fragmented sleep until 1 Am and then about 5-6 hours of more sleep most nights- not bad!    Epworth Sleepiness Score 8/ 24, FSS at 15/ 63       HISTORY OF PRESENT ILLNESS:    05-19-2021:  ALYAS CREARY is a 61 y.o. year old  Caucasian male patient seen here In a RV 12/27/2021 : Mr. Crean just underwent a nasal septoplasty with bilateral turbinate reduction I have the detailed note from Dr. Flossie Buffy dated 04-21-2021 and he recently saw the surgeon again and has been told that he is 90% healed at this time.  If his nasal patency is significantly improved we should retest him if apnea is still present.  Sleep apnea is a symptom of mouth breathing patient's and CPAP helps to keep an air splint open in  the back of the throat and allowing airflow this way CPAP also relies on nasal passage to be effectively applying pressure.  So at this time I would like to know where the new apnea degree may be after the surgery and if we need to continue with treatment of apnea or if a dental device can then be used instead.  So I will order a home sleep test as a control test I am booked out for at least 3 weeks so it will be enough to work sleep early August weeks probably.  And I like for the patient to be completely healed before we would reinitiate any CPAP should that be the recommendation of the sleep study.   Chronic insomnia , previous evaluations have not found an organic cause.  I would recommend a cognitive behaviour therapy if our repeat sleep study gives similar results.  Sinus CT was normal. Dr Benjamine Mola.  FAA - he is required to treat his  apnea- and can't tolerate CPAP- worsened his Insomnia. He only can use it 4 hours at night- FAA wants to see 6 hours at night.  He does not take sleep aids that were provided- he has not been compliant with either. He cannot initiate sleep- !      07-15-2020 The patient underwent a home sleep test by watch pat this was his third sleep study ,  This home sleep test documented an AHI of 31.3 strongly dependent on supine sleep position also sleeping on your back.  Sleep he usually avoids that at home, there was not much REM sleep dependency noted there was no clinical significant hypoxemia noted and he had a normal pulse range variability between 45 and 110 bpm this is entirely normal for age and gender.  Since a home sleep test that the documented severe OSA he just tipped the 13th apnea marked without any central apneas.  He felt that CPAP would be the best option however CPAP has not been helpful with his second main sleep disorder chronic insomnia.  His AHI is compliant use of CPAP has been 0.7 so this is a very significant reduction the 95th percentile pressure he needs is only 6 cmH2O minimum pressure was set at 5 maximum pressure at 15 and median usage on days used is just 4 hours 2 minutes.  However there were several days where the machine was not used at all.  What I like to do is he has tried several masks but has not found any of them very comfortable, his medication list is fairly unchanged.  He endorsed the Epworth sleepiness score at 3 points and the fatigue severity at 9 points.   My question for the patient is if he would rather consider an inspire device than using CPAP.  He reports being unhappy with CPAP.       See first 80-02 2021 from dr. Melford Aase, his PCP.  Chief concern according to patient :  I can't fall asleep and have trouble with fatigue " . As long as a stay active and stimulated, I wont get sleepy.  I yawn a lot while driving"   I have the pleasure of seeing DARRLY LOBERG , a right handed  Caucasian male with a possible chronic sleep disorder.  he developed a chronic insomnia, chronic cough, head pressure, lightheadedness.  He  has a past medical history of Allergy, Chronic cough, Chronic sinusitis, Diverticulitis (2018), Diverticulosis of colon (without mention of hemorrhage), Early satiety, Esophageal motility disorder (12/2018),  Family history of adverse reaction to anesthesia, GERD (gastroesophageal reflux disease), History of kidney stones, History of left inguinal hernia, Hypertension, Insomnia, Lyme disease, Pulmonary nodules (10/2018), Right inguinal hernia (2017), Sleep apnea, Sleep deficient, Testosterone deficiency, Vitamin D deficiency, and Wears glasses.    He has chronic insomnia problems, he uses Benadryl. 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 He flies all  domestic, [place], [place]) The patient had the first sleep study in the year 2005 with a negative result of "no apnea present"    Sleep relevant medical history: Nocturia once, no Tonsillectomy. Head injury in childhood.   Family medical /sleep history:mother died of pancreatic cancer.  no other family member with insomnia, maternal uncle was obese and had OSA.   Social history:  Patient is working as a Insurance underwriter and out-of work since 08-2018 and lives in a household with spouse and a college age daughter is home right now. Family status is married  With one daughter. The patient currently is out- of work.  Pets are present. One cat.  Tobacco use: none .  ETOH use : seldomly ,  Caffeine intake in form of Coffee( in AM ) Soda( lunchtime) Tea ( afternoon ) no energy drinks. Regular exercise none.   Sleep habits are as follows: The patient's dinner time is between 6 PM. The patient goes to bed at 11.30 PM and he often sleeps not before midnight. continues to sleep for 4-6 hours, wakes  for one bathroom break.   The bedroom is cool, quiet and dark- wife snores. TV is not used .  The preferred sleep position is right sided. , with the support of 1 pillow. Dreams are reportedly rare. He sometimes yells. 6-7  AM is the usual rise time. The patient wakes up spontaneously.  He  reports not feeling refreshed or restored in AM, with symptoms such as dry mouth, morning headaches and sinus pressure , and residual fatigue.  Naps are taken infrequently.     Review of Systems: Out of a complete 14 system review, the patient complains of only the following symptoms, and all other reviewed systems are negative.:  Fatigue, sleepiness , snoring, fragmented sleep, Insomnia.   How likely are you to doze in the following situations: 0 = not likely, 1 = slight chance, 2 = moderate chance, 3 = high chance   Sitting and Reading? Watching Television? Sitting inactive in a public place (theater or meeting)? As a passenger in a car for an hour without a break? Lying down in the afternoon when circumstances permit? Sitting and talking to someone? Sitting quietly after lunch without alcohol? In a car, while stopped for a few minutes in traffic?   Total = 8/ 24 points   FSS endorsed at  15 - down from 38/ 63 points.   Social History   Socioeconomic History   Marital status: Married    Spouse name: Not on file   Number of children: 1   Years of education: Not on file   Highest education level: Not on file  Occupational History   Occupation: PILOT    Employer: [employer]  Tobacco Use   Smoking status: Never   Smokeless tobacco: Never  Vaping Use   Vaping Use: Never used  Substance and Sexual Activity   Alcohol use: Yes    Alcohol/week: 2.0 - 3.0  standard drinks    Types: 2 - 3 Cans of beer per week   Drug use: No   Sexual activity: Not on file  Other Topics Concern   Not on file  Social History Narrative   Daily caffeine    Social Determinants of Health   Financial  Resource Strain: Not on file  Food Insecurity: Not on file  Transportation Needs: Not on file  Physical Activity: Not on file  Stress: Not on file  Social Connections: Not on file    Family History  Problem Relation Age of Onset   Breast cancer Maternal Grandmother    Colon polyps Brother    Colon polyps Maternal Uncle    Cancer Mother 87       pancreatic   Colon cancer Neg Hx    Esophageal cancer Neg Hx    Stomach cancer Neg Hx    Ulcerative colitis Neg Hx     Past Medical History:  Diagnosis Date   Allergy    Chronic cough    Chronic sinusitis    Diverticulitis 2018   Mild   Diverticulosis of colon (without mention of hemorrhage)    Early satiety    Esophageal motility disorder 12/2018   Family history of adverse reaction to anesthesia    mom has PONV   GERD (gastroesophageal reflux disease)    History of kidney stones    2017   History of left inguinal hernia    Hypertension    Insomnia    Lyme disease    QUESTIONABLE   Pulmonary nodules 10/2018   Scattered tiny subpleural pulmonary nodules at the peripheral right lung base, largest 3 mm   Right inguinal hernia 2017   Small fat containing right inguinal hernia   Sleep apnea    no CPAP   Sleep deficient    Testosterone deficiency    Vitamin D deficiency    Wears glasses     Past Surgical History:  Procedure Laterality Date   BRAVO [place] STUDY N/A 05/02/2019   Procedure: BRAVO [place] STUDY;  Surgeon: Milus Banister, MD;  Location: [facility];  Service: Endoscopy;  Laterality: N/A;   COLONOSCOPY  02/2011   ESOPHAGOGASTRODUODENOSCOPY (EGD) WITH PROPOFOL N/A 05/02/2019   Procedure: ESOPHAGOGASTRODUODENOSCOPY (EGD) WITH PROPOFOL;  Surgeon: Milus Banister, MD;  Location: [facility];  Service: Endoscopy;  Laterality: N/A;   HERNIA REPAIR     NASAL SEPTOPLASTY W/ TURBINOPLASTY Bilateral 04/21/2021   Procedure: NASAL SEPTOPLASTY WITH  BILATERAL TURBINATE REDUCTION;  Surgeon: Leta Baptist, MD;  Location: [facility] OR;   Service: ENT;  Laterality: Bilateral;   TOE SURGERY     INGROWN TOE NAIL, CHILDHOOD   UPPER GASTROINTESTINAL ENDOSCOPY     VASECTOMY       Current Outpatient Medications on File Prior to Visit  Medication Sig Dispense Refill   Azelastine HCl 137 MCG/SPRAY SOLN SMARTSIG:2 Spray(s) Both Nares Every Evening     bisoprolol (ZEBETA) 10 MG tablet TAKE 1/2 TO 1 TABLET DAILY FOR BLOOD PRESSURE 90 tablet 3   calcium carbonate (TUMS EX) 750 MG chewable tablet Chew 1 tablet by mouth daily.     cholecalciferol (VITAMIN D) 25 MCG (1000 UNIT) tablet Take 1,000 Units by mouth daily.     clindamycin (CLEOCIN T) 1 % lotion Apply 1 application topically every evening.     famotidine (PEPCID) 20 MG tablet Take 20 mg by mouth daily.     fluticasone (FLONASE) 50 MCG/ACT nasal spray Place 2 sprays into  both nostrils daily.      hydrocortisone cream 1 % Apply 1 application topically daily as needed for itching.     ipratropium (ATROVENT) 0.06 % nasal spray SMARTSIG:1 Spray(s) Both Nares Twice Daily PRN     ketotifen (ZADITOR) 0.025 % ophthalmic solution Uses prn 5 mL 0   loratadine (CLARITIN) 10 MG tablet Take 10 mg by mouth daily.     montelukast (SINGULAIR) 10 MG tablet TAKE 1 TABLET BY MOUTH DAILY FOR ALLERGIES 30 tablet 3   olmesartan (BENICAR) 20 MG tablet Take 1 tablet at Bedtime for BP                           /            TAKE 1/2 TO 1 (ONE-HALF TO ONE) TABLET BY MOUTH 90 tablet 3   Olopatadine HCl 0.2 % SOLN Place 1 drop into both eyes daily as needed.     Omega-3 1000 MG CAPS Take 1,000 mg by mouth daily.     OVER THE COUNTER MEDICATION Take 2 capsules by mouth at bedtime. Serenagen otc supplement     RABEprazole (ACIPHEX) 20 MG tablet Take 1 tablet (20 mg total) by mouth 2 (two) times daily. 180 tablet 1   tadalafil (CIALIS) 5 MG tablet Take 5 mg by mouth in the morning.     tamsulosin (FLOMAX) 0.4 MG CAPS capsule TAKE 1 CAPSULE AT BEDTIME FOR PROSTATE (Patient taking differently: Take 0.4 mg by  mouth at bedtime.) 90 capsule 2   traZODone (DESYREL) 50 MG tablet Take 1 tablet (50 mg total) by mouth at bedtime as needed for sleep. 30 tablet 3   triamcinolone cream (KENALOG) 0.1 % Apply 1 application topically 2 (two) times daily as needed for itching.     Zinc 50 MG TABS Take 1 tablet Daily  0   No current facility-administered medications on file prior to visit.    Allergies  Allergen Reactions   Allegra [Fexofenadine] Other (See Comments)    Worsened insomnia   Benzoyl Peroxide     blisters   Decadron [Dexamethasone]     Reflux & Hiccups, GI upset   Doxycycline Nausea Only   Levaquin [Levofloxacin] Nausea And Vomiting    Patient states that after he took levaquin this past time he had nausea with vomiting    Physical exam:  Today's Vitals   12/27/21 1312  BP: (!) 147/79  Pulse: 62  Weight: 176 lb (79.8 kg)  Height: _0  (1.753 m)   Body mass index is 25.99 kg/m.   Wt Readings from Last 3 Encounters:  12/27/21 176 lb (79.8 kg)  11/11/21 175 lb (79.4 kg)  09/14/21 172 lb (78 kg)     Ht Readings from Last 3 Encounters:  12/27/21 _1  (1.753 m)  11/11/21 _2  (1.753 m)  09/14/21 _3  (1.753 m)      General: The patient is awake, alert and appears not in acute distress. The patient is well groomed. Head: Normocephalic, atraumatic. Neck is supple. Mallampati 2- very exaggerated gag reflex. ,  neck circumference: 17 inches . Nasal airflow patent.  Retrognathia is  seen.   Cardiovascular:  Regular rate and cardiac rhythm by pulse,  without distended neck veins. Respiratory: Lungs are clear to auscultation.  Skin:  Without evidence of ankle edema, or rash. Trunk: The patient's posture is erect.   Neurologic exam : The patient is awake and alert, oriented  to place and time.   Memory subjective described as intact.  Attention span & concentration ability appears normal.  Speech is fluent,  without  dysarthria, dysphonia or aphasia.  Mood and affect are  appropriate.   Cranial nerves: no loss of smell or taste reported  Pupils are equal and briskly reactive to light. Funduscopic exam deferred.  Extraocular movements in vertical and horizontal planes were intact and without nystagmus. No Diplopia. senistivity to sinus pressure points around the orbit. Facial motor strength is symmetric and tongue and uvula move midline.  Neck ROM : rotation, tilt and flexion extension were normal for age and shoulder shrug was symmetrical.    Motor exam:  Symmetric bulk, tone and ROM.   Normal tone without cog wheeling, symmetric grip strength .   Sensory:  Fine touch, pinprick and vibration were tested  and  normal.  Coordination: no evidence of ataxia, dysmetria or tremor. Gait and station: Patient could rise unassisted from a seated position, walked without assistive device.  Deep tendon reflexes: in the upper and lower extremities are symmetric and intact.  Babinski response was deferred.       After spending a total time of 25 minutes face to face and additional time for physical and neurologic examination, review of laboratory studies,  personal review of imaging studies, reports and results of other testing and review of referral information / records as far as provided in visit, I have established the following assessments:   Insomnia is sleep initiation insomnia - Trazodone is still an option.Marland KitchenApnea treatment is now by dental device , early in the course. Here to f/u on dental device.  Dr Ronnald Ramp made his dental appliance December 2022, patient has been making adjustment but not noticed improvements yet.   Pt will need a repeat PSG to show improvements for work.  This will be ordered for March 2023.      My Plan is to proceed with:  Repeat either HST March 2023 or PSG  with dental device. I would like to thank Dr Benjamine Mola, and Unk Pinto, [place],  [place] [address] for allowing me to meet with and to take care  of this pleasant patient.    Electronically signed by: Larey Seat, MD 12/27/2021 2:15 PM  [facility] Board certified by The [employer] of Sleep Medicine and Diplomate of the [employer] of Sleep Medicine. Board certified In Neurology through the [place], Fellow of the [employer] of Neurology. Medical Director of [facility].

## 2022-01-27 ENCOUNTER — Encounter: Payer: Self-pay | Admitting: Neurology

## 2022-02-07 ENCOUNTER — Ambulatory Visit: Payer: 59 | Admitting: Neurology

## 2022-02-16 ENCOUNTER — Ambulatory Visit (INDEPENDENT_AMBULATORY_CARE_PROVIDER_SITE_OTHER): Payer: Commercial Managed Care - PPO | Admitting: Neurology

## 2022-02-16 DIAGNOSIS — Z0289 Encounter for other administrative examinations: Secondary | ICD-10-CM

## 2022-02-16 DIAGNOSIS — Z789 Other specified health status: Secondary | ICD-10-CM

## 2022-02-16 DIAGNOSIS — F5104 Psychophysiologic insomnia: Secondary | ICD-10-CM

## 2022-02-16 DIAGNOSIS — G4733 Obstructive sleep apnea (adult) (pediatric): Secondary | ICD-10-CM

## 2022-02-16 DIAGNOSIS — Z9889 Other specified postprocedural states: Secondary | ICD-10-CM

## 2022-02-22 NOTE — Progress Notes (Signed)
? ? ?  ?  ?[facility] ?  ?HOME SLEEP TEST REPORT ( by Watch PAT)   ?STUDY DATE: 02-22-2022   ?  ?ORDERING CLINICIAN: Larey Seat, MD  ?REFERRING CLINICIAN:  ?  ?CLINICAL INFORMATION/HISTORY: [name]. Holmer has been diagnosed with obstructive sleep apnea, did not find satisfaction in CPAP treatment, mainly because he is a short sleeper and could not fulfill the criteria of CPAP compliance easily.  He then was referred to Dr. Toy Cookey where he received a dental device which he has slowly advanced. ?Preoperative home sleep test to see if the patient has derived measurable benefit.  A previous sleep study showed that when he sleeps on his right side his AHI was only 7.6/h so this will help to reduce it.  He had seen Dr. Genia Harold ENT for nasal patency and underwent surgery.  Home sleep test in early 2022 showed an AHI of 31.3 and was low was dependent on supine sleep position. ? ? ?  ?Epworth sleepiness score: 11/24. ?  ?BMI: 25.5kg/m? ?  ?Neck Circumference: 17" ?  ?FINDINGS: ?  ?Sleep Summary: ?  ?Total Recording Time (hours, min): 6 hours and 52 minutes    ?  ?Total Sleep Time (hours, min):    5 hours 0 minutes           ?  ?Percent REM (%):    20.6%                                  ?  ?Respiratory Indices: ?  ?Calculated pAHI at 3% (per hour):    26.1/h    ?Calculated pAHI at 4%                 :    8.1/h ?  ?REM pAHI:   23.5/h    ( 7.8/h)                                        ?  ?NREM pAHI: 26.8/h   (6.6/h)                      ?  ?Positional AHI: In supine sleep this patient still achieved an apnea-hypopnea index of 32.8/h.  Sleeping on the right side was associated with an AHI of 14.4/h and sleeping on his left side with an AHI of 0. ? ?Snoring showed a mean volume of 41 dB and was only recorded for 16.8% of total sleep time which is mild.                                               ?  ?Oxygen Saturation Statistics: ?   ?O2 Saturation Range (%):   Between a nadir of 85% and a maximum of 99%  .   Mean oxygen saturation was 94%                                ?  ?O2 Saturation (minutes) <89%:    0 minutes     ?  ?Pulse Rate Statistics:              ?  ?  Pulse Range: Between 44 and 85 bpm with a mean heart rate of 52 bpm             ?  ?IMPRESSION:  This HST confirms the presence of non-REM sleep dependent obstructive sleep apnea, not associated with hypoxia.   ?Mild snoring was present.  ? The degree of sleep apnea was worse when sleeping in supine and was mild while sleeping in nonsupine position. ? ?Using the 4% desaturation scale, this patient's apnea is mild at an AHI of 8.1/h. there were no central apneas.  ? ? ?PS : The last hour of recording was trimmed of motion artefact.  ?  ?RECOMMENDATION:  ?  ?INTERPRETING PHYSICIAN: ? ? Larey Seat, MD  ? ?Medical Director of [facility].  ? ? ? ? ? ? ? ? ? ? ? ? ? ? ? ? ? ? ? ? ? ?

## 2022-03-07 DIAGNOSIS — Z0289 Encounter for other administrative examinations: Secondary | ICD-10-CM | POA: Insufficient documentation

## 2022-03-07 NOTE — Procedures (Signed)
[facility] ?  ?HOME SLEEP TEST REPORT ( by Watch PAT)   ?STUDY DATE: 02-22-2022   ?  ?ORDERING CLINICIAN: Larey Seat, MD  ?REFERRING CLINICIAN:  ?  ?CLINICAL INFORMATION/HISTORY: [name] has been diagnosed with obstructive sleep apnea, did not find satisfaction in CPAP treatment, mainly because he is a short sleeper and could not fulfill the criteria of CPAP compliance easily.  He then was referred to Dr. Toy Cookey where he received a dental device which he has slowly advanced. ?Preoperative home sleep test to see if the patient has derived measurable benefit.  A previous sleep study showed that when he sleeps on his right side his AHI was only 7.6/h so this will help to reduce it.  He had seen Dr. Benjamine Mola in ENT for nasal patency and underwent surgery.  Home sleep test in early 2022 showed an AHI of 31.3 and was low was dependent on supine sleep position. ? ? ?  ?Epworth sleepiness score: 11/24. ?  ?BMI: 25.5kg/m? ?  ?Neck Circumference: 17" ?  ?FINDINGS: ?  ?Sleep Summary: ?  ?Total Recording Time (hours, min): 6 hours and 52 minutes    ?  ?Total Sleep Time (hours, min):    5 hours 0 minutes           ?  ?Percent REM (%):    20.6%                                  ?  ?Respiratory Indices: ?  ?Calculated pAHI at 3% (per hour):    26.1/h    ?Calculated pAHI at 4%                 :    8.1/h ?  ?REM pAHI:   23.5/h    ( 7.8/h)                                        ?  ?NREM pAHI: 26.8/h   (6.6/h)                      ?  ?Positional AHI: In supine sleep this patient still achieved an apnea-hypopnea index of 32.8/h.  Sleeping on the right side was associated with an AHI of 14.4/h and sleeping on his left side with an AHI of 0. ? ?Snoring showed a mean volume of 41 dB and was only recorded for 16.8% of total sleep time which is mild.                                               ?  ?Oxygen Saturation Statistics: ?   ?O2 Saturation Range (%):   Between a nadir of 85% and a maximum of 99%  .  Mean oxygen  saturation was 94%                                ?  ?O2 Saturation (minutes) <89%:    0 minutes     ?  ?Pulse Rate Statistics:              ?  ?  Pulse Range: Between 44 and 85 bpm with a mean heart rate of 52 bpm             ?  ?IMPRESSION:  This HST confirms the presence of non-REM sleep dependent obstructive sleep apnea, not associated with hypoxia.   ?Mild snoring was present.  ? The degree of sleep apnea was worse when sleeping in supine and was mild while sleeping in nonsupine position. ? ?Using the 4% desaturation scale, this patient's apnea is mild at an AHI of 8.1/h. there were no central apneas.  ? ? ?PS : The last hour of recording was trimmed of motion artefact.  ?  ?RECOMMENDATION:  ?  ?INTERPRETING PHYSICIAN: ? ? Larey Seat, MD  ? ?Medical Director of [facility] [employer] [facility].  ? ? ? ? ? ? ? ? ? ? ? ? ? ? ? ? ? ?

## 2022-03-08 ENCOUNTER — Telehealth: Payer: Self-pay | Admitting: Neurology

## 2022-03-08 NOTE — Telephone Encounter (Signed)
Called the patient and reviewed in detailed the sleep study results. In reviewing the results and comparing to previous sleep study without the dental device the study indicated that there was worsening in the apnea events from the previous study in the way the study was scored. Reviewed the data. The patient questioned the HST report. He was set with his dental device at 5 mm. Pt was advised that a copy of the reports will be sent to the dentist for them to determine what adjustments need to be made to the dental device. Once those adjustments have been made they would need to inform us and let us know and we will look at repeating the sleep study under the new device measurement. Pt verbalized understanding. ? ?

## 2022-03-08 NOTE — Telephone Encounter (Signed)
Pt requesting specific results from sleep study.  ?Requesting specific results from times of study  between 5:30am and 8am when pt was asleep. Would like a call back.  ?

## 2022-03-08 NOTE — Telephone Encounter (Signed)
-----   Message from Larey Seat, MD sent at 03/07/2022  5:38 PM EDT ----- ?There is mild apnea on dental device. AHI is still above 5/h.  ?

## 2022-03-09 NOTE — Telephone Encounter (Signed)
Pt checked with his dentist (Dr Ronnald Ramp in [facility]) he is being told they have not received anything from our office just yet.  Pt is asking if RN can refax if not done already. ?

## 2022-03-09 NOTE — Telephone Encounter (Signed)
Yesterday when I called the office the oncall person provided a email. Paperwork was [employer], [employer]. ?I called the office today and they were able to provide a fax #. [phone]. Fax was sent today. Received confirmation that it went through ?

## 2022-03-16 ENCOUNTER — Ambulatory Visit (INDEPENDENT_AMBULATORY_CARE_PROVIDER_SITE_OTHER): Payer: Commercial Managed Care - PPO | Admitting: Nurse Practitioner

## 2022-03-16 ENCOUNTER — Encounter: Payer: Self-pay | Admitting: Nurse Practitioner

## 2022-03-16 VITALS — BP 100/60 | HR 70 | Temp 97.5°F | Wt 175.2 lb

## 2022-03-16 DIAGNOSIS — R7309 Other abnormal glucose: Secondary | ICD-10-CM | POA: Diagnosis not present

## 2022-03-16 DIAGNOSIS — F5104 Psychophysiologic insomnia: Secondary | ICD-10-CM

## 2022-03-16 DIAGNOSIS — E782 Mixed hyperlipidemia: Secondary | ICD-10-CM | POA: Diagnosis not present

## 2022-03-16 DIAGNOSIS — I10 Essential (primary) hypertension: Secondary | ICD-10-CM

## 2022-03-16 DIAGNOSIS — E559 Vitamin D deficiency, unspecified: Secondary | ICD-10-CM | POA: Diagnosis not present

## 2022-03-16 DIAGNOSIS — E663 Overweight: Secondary | ICD-10-CM

## 2022-03-16 DIAGNOSIS — K219 Gastro-esophageal reflux disease without esophagitis: Secondary | ICD-10-CM

## 2022-03-16 DIAGNOSIS — G4733 Obstructive sleep apnea (adult) (pediatric): Secondary | ICD-10-CM

## 2022-03-16 DIAGNOSIS — E291 Testicular hypofunction: Secondary | ICD-10-CM

## 2022-03-16 MED ORDER — BISOPROLOL-HYDROCHLOROTHIAZIDE 2.5-6.25 MG PO TABS: 1.0000 | ORAL_TABLET | Freq: Every day | ORAL | 2 refills | Status: DC

## 2022-03-16 NOTE — Patient Instructions (Addendum)
Stop Bisoprolol and olmesartan ?Start Ziac 2.5/6.25 and record BP daily ?Follow up in 1 month ? ?Bisoprolol; Hydrochlorothiazide Tablets ?What is this medication? ?BISOPROLOL; HYDROCHLOROTHIAZIDE (bis OH proe lol; hye droe klor oh THYE a zide) treats high blood pressure. It relaxes your blood vessels and helps your kidneys remove more fluid through the urine, which lowers blood pressure. It is a combination of a beta blocker and a diuretic. ?This medicine may be used for other purposes; ask your health care provider or pharmacist if you have questions. ?COMMON BRAND NAME(S): Ziac ?What should I tell my care team before I take this medication? ?They need to know if you have any of these conditions: ?Diabetes ?Heart or vessel disease like slow heart rate, worsening heart failure, heart block, sick sinus syndrome or Raynaud's disease ?Kidney disease ?Liver disease ?Lung or breathing disease like asthma or emphysema ?Lupus ?Pancreatitis ?Thyroid disease ?An unusual or allergic reaction to bisoprolol, hydrochlorothiazide, sulfa medications, other medications, foods, dyes, or preservatives ?Pregnant or trying to get pregnant ?Breast-feeding ?How should I use this medication? ?Take this medication by mouth. Take it as directed on the prescription label at the same time every day. You can take it with or without food. If it upsets your stomach, take it with food. Keep taking it unless your care team tells you to stop. ?Talk to your care team about the use of this medication in children. Special care may be needed. ?Overdosage: If you think you have taken too much of this medicine contact a poison control center or emergency room at once. ?NOTE: This medicine is only for you. Do not share this medicine with others. ?What if I miss a dose? ?If you miss a dose, take it as soon as you can. If it is almost time for your next dose, take only that dose. Do not take double or extra doses. ?What may interact with this  medication? ?Do not take this medicine with any of the following medications: ?Cidofovir ?Dofetilide ?This medicine may also interact with the following: ?Certain medications for seizures like phenobarbital or primidone ?Cholestyramine ?Colestipol ?Lithium ?Medications for blood pressure or heart failure ?Medications to control heart rhythm, like diltiazem, digoxin, disopyramide, or verapamil ?Medications for diabetes ?Medications that relax muscles for surgery ?NSAIDs, medications for pain and inflammation, like ibuprofen or naproxen ?Opioid medications ?Rifampin ?Steroid medications like prednisone or cortisone ?This list may not describe all possible interactions. Give your health care provider a list of all the medicines, herbs, non-prescription drugs, or dietary supplements you use. Also tell them if you smoke, drink alcohol, or use illegal drugs. Some items may interact with your medicine. ?What should I watch for while using this medication? ?Visit your care team for regular checks on your progress. Check your blood pressure as directed. Know what your blood pressure should be and when to contact your care team. ?Do not treat yourself for coughs, colds, or pain while you are using this medication without asking your care team for advice. Some medications may increase your blood pressure. ?This medication may affect your coordination, reaction time, or judgment. Do not drive or operate machinery until you know how this medication affects you. Sit up or stand slowly to reduce the risk of dizzy or fainting spells. Drinking alcohol with this medication can increase the risk of these side effects. ?Talk to your care team about your risk of skin cancer. You may be more at risk for skin cancer if you take this medication. ?This medication may make  you more sensitive to the sun. Keep out of the sun. If you cannot avoid being in the sun, wear protective clothing and sunscreen. Do not use sun lamps or tanning  beds/booths. ?This medication may increase blood sugar. The risk may be higher in patients who already have diabetes. Ask your care team what you can do to lower your risk of diabetes while taking this medication. ?Check with your care team if you have severe diarrhea, nausea, and vomiting, or if you sweat a lot. The loss of too much body fluid may make it dangerous for you to take this medication. ?You may need to be on a special diet while you are taking this medication. Ask your care team. Also, find out how many glasses of fluid you need to drink each day. ?Tell your care team right away if you have any changes in your eyesight or have eye pain. ?What side effects may I notice from receiving this medication? ?Side effects that you should report to your care team as soon as possible: ?Allergic reactions--skin rash, itching, hives, swelling of the face, lips, tongue, or throat ?Dehydration--increased thirst, dry mouth, feeling faint or lightheaded, headache, dark yellow or brown urine ?Gout--severe pain, redness, warmth, or swelling in joints, such as the big toe ?Heart failure--shortness of breath, swelling of the ankles, feet, or hands, sudden weight gain, unusual weakness or fatigue ?Kidney injury--decrease in the amount of urine, swelling of the ankles, hands, or feet ?Low blood pressure--dizziness, feeling faint or lightheaded, blurry vision ?Low potassium level--muscle pain or cramps, unusual weakness or fatigue, fast or irregular heartbeat, constipation ?Raynaud's--cool, numb, or painful fingers or toes that may change color from pale, to blue, to red ?Slow heartbeat--dizziness, feeling faint or lightheaded, confusion, trouble breathing, unusual weakness or fatigue ?Sudden eye pain or change in vision such as blurry vision, seeing halos around lights, vision loss ?Worsening mood, feelings of depression ?Side effects that usually do not require medical attention (report to your care team if they continue or  are bothersome): ?Change in sex drive or performance ?Diarrhea ?Fatigue ?Headache ?Nausea ?Upset stomach ?Vivid dreams or nightmares ?This list may not describe all possible side effects. Call your doctor for medical advice about side effects. You may report side effects to FDA at 1-800-FDA-1088. ?Where should I keep my medication? ?Keep out of the reach of children and pets. ?Store at room temperature between 20 and 25 degrees C (68 and 77 degrees F). Get rid of any unused medication after the expiration date. ?To get rid of medications that are no longer needed or have expired: ?Take the medication to a medication take-back program. Check with your pharmacy or law enforcement to find a location. ?If you cannot return the medication, check the label or package insert to see if the medication should be thrown out in the garbage or flushed down the toilet. If you are not sure, ask your care team. If it is safe to put it into the trash, empty the medication out of the container. Mix the medication with cat litter, dirt, coffee grounds, or other unwanted substance. Seal the mixture in a bag or container. Put it into the trash. ?NOTE: This sheet is a summary. It may not cover all possible information. If you have questions about this medicine, talk to your doctor, pharmacist, or health care provider. ?? 2023 Elsevier/Gold Standard (2021-12-22 00:00:00) ? ?

## 2022-03-16 NOTE — Progress Notes (Signed)
?FOLLOW UP ? ?Assessment and Plan:  ? ?Hypertension ?Stop bisoprolol and Start Ziac 2.5/6.'25mg'$  daily and monitor BP ?Monitor blood pressure at home; patient to call if consistently greater than 130/80 ?Continue DASH diet.   ?Reminder to go to the ER if any CP, SOB, nausea, dizziness, severe HA, changes vision/speech, left arm numbness and tingling and jaw pain. ?F/U in 4 weeks to reevaluate ?- CBC ? ?Cholesterol ?Currently at LDL goal by lifestyle modification;  ?Lifestyle for trigs reviewed;  ?Continue low cholesterol diet and exercise.  ?- Lipid panel.  ?- CMP ? ?Other abnormal glucose ?Recent A1Cs at goal ?Discussed diet/exercise, weight management  ?- A1c ? ?BMI 25 ?Lifestyle discussed at length; he is consuming minimal vegetables ?Recommended diet heavy in fruits and veggies and low in animal meats, cheeses, and dairy ?products, appropriate calorie intake; reduce processed meats/foods ?Increase physical activity ?Follow up at next visit ? ?Vitamin D Def ?At goal at last visit; continue supplementation to maintain goal of 60-100 ?Defer Vit D level ? ?Testosterone deficiency ?- continue Zinc supplementation ? ?Insomnia ?- preferring to avoid meds at this time ?- suggested short term melatonin 5-15 mg 1 hour prior to planned bedtime, get up at same time daily, avoid napping ?- good sleep hygiene discussed, increase day time activity ? ?OSA  ?Followed by Dr, Dohmeier ?Could not tolerate CPAP ?Underwent nasal septoplasty bilaterally 04/2021 ? ?Seasonal allergies ?Continue Montelukast, Claritin and inhalers ? ?GERD ?Continue medications and follow with [facility] ? ? ?Continue diet and meds as discussed. Further disposition pending results of labs. Discussed med's effects and SE's.   ?Over 30 minutes of exam, counseling, chart review, and critical decision making was performed.  ? ?Future Appointments  ?Date Time Provider [place]  ?03/18/2022  3:00 PM Kennedy-Smith, Patrecia Pour, NP [facility]   ?05/17/2022  9:30 AM Dohmeier, Asencion Partridge, MD [facility] None  ?09/14/2022  2:00 PM Unk Pinto, MD [facility] None  ? ? ?---------------------------------------------------------------------------------------------------------------------- ? ?HPI ?61 y.o. male  presents for 3 month follow up on hypertension, cholesterol, glucose management, weight, hypogonadism and vitamin D deficiency.  ? ?He is on Aciphex for GERD but continues to have symptoms and also takes [place]. Is being evaluated by GI. ?  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 Bravo test per Dr Ardis Hughs did not reveal significant acid reflux and did not feel his cough was related to GERD. He also was seen by ENT, Dr Carol Ada w/o any ENT pathology identified. Allergy Evaluation by Dr Orvil Feil was likewise unrevealing. Patient reports PFT's per Dr Melvyn Novas did not reveal Asthma and empiric treatment with MDI's did not improve his cough.  Patient has been out of work per Dr Melvyn Novas & on Disability... since last Oct 2019 for Allergic Rhinitis & 'Upper Airway Cough Syndrome'..]] He is currently being evaluated by GI for persistent cough which occurs more when stomach is empty.  ? ?Patient has  Moderate OSA and is followed by Dr Brett Fairy and recommended Auto CPAP, however he was unable to tolerate. He has undergone bilateral nasal septoplasty and was fit with an oral device. He has undergone 3 separate sleep studies. He is undergoing adjustments with his oral device. He is not snoring when wearing the device.  ? ?He does use Ventolin rescue inhaler as needed and Flovent daily for cough. He does use claritin and  Singulair daily for seasonal allergies. ?  ?BMI is Body mass index is 25.87 kg/m?., he has been working on diet and exercise, has been cutting down on fried/fatty foods, has started walking, 20-30 min 2-3 days a  week weather permitting. Does "wheat" bread sandwich; he is eating some fruits,  ?Wt Readings from Last 3 Encounters:  ?03/16/22 175 lb 3.2 oz (79.5 kg)  ?12/27/21 176 lb (79.8 kg)  ?11/11/21 175 lb (79.4 kg)  ? ?His blood pressure has been controlled at home running in 110-130/70's, today their BP is BP: 100/60 . He has stopped his Olmesartan as it was causing him to cough and was feeling light headed. Currently Bisoprolol 10 mg 1/2 tab QAM. Continues to have dizziness ?BP Readings from Last 3 Encounters:  ?03/16/22 100/60  ?12/27/21 (!) 147/79  ?11/11/21 122/79  ? ?  ? He does not workout. He denies chest pain, shortness of breath, dizziness. ? ? He is not on cholesterol medication. His cholesterol is not at goal. He has been  The cholesterol last visit was:   ?Lab Results  ?Component Value Date  ? CHOL 162 09/14/2021  ? HDL 36 (L) 09/14/2021  ? [place] 98 09/14/2021  ? TRIG 187 (H) 09/14/2021  ? CHOLHDL 4.5 09/14/2021  ? ? He has not been working on diet and exercise for glucose management, and denies increased appetite, nausea, paresthesia of the feet, polydipsia and polyuria. Last A1C in the office was:  ?Lab Results  ?Component Value Date  ? HGBA1C 5.0 09/14/2021  ? ?Patient is on Vitamin D supplement but was at goal of 60-100:  ?Lab Results  ?Component Value Date  ? VD25OH 93 09/14/2021  ?   ?He has a history of testosterone deficiency and he does take Zinc 50 mg daily ?Lab Results  ?Component Value Date  ? TESTOSTERONE 237 (L) 09/14/2021  ? ?  ?Current Medications:  ?Current Outpatient Medications on File Prior to Visit  ?Medication Sig  ? Azelastine HCl 137 MCG/SPRAY SOLN SMARTSIG:2 Spray(s) Both Nares Every Evening  ? calcium carbonate (TUMS EX) 750 MG chewable tablet Chew 1 tablet by mouth daily.  ? cholecalciferol (VITAMIN D) 25 MCG (1000 UNIT) tablet Take 1,000 Units by mouth daily.  ? clindamycin (CLEOCIN T) 1 % lotion Apply 1 application topically every evening.  ? famotidine (PEPCID) 20 MG tablet  Take 20 mg by mouth daily.  ? fluticasone (FLONASE) 50 MCG/ACT nasal spray Place 2 sprays into both nostrils daily.   ? hydrocortisone cream 1 % Apply 1 application topically daily as needed for itching.  ? ipratropium (ATROVENT) 0.06 % nasal spray SMARTSIG:1 Spray(s) Both Nares Twice Daily PRN  ? ketotifen (ZADITOR) 0.025 % ophthalmic solution Uses prn  ? loratadine (CLARITIN) 10 MG tablet Take 10 mg by mouth daily.  ? montelukast (SINGULAIR) 10 MG tablet TAKE 1 TABLET BY MOUTH DAILY FOR ALLERGIES  ? OVER THE COUNTER MEDICATION Take 2 capsules by mouth at bedtime. Serenagen otc supplement  ? RABEprazole (ACIPHEX) 20 MG tablet Take 1 tablet (20 mg total) by mouth 2 (two) times daily.  ? tadalafil (CIALIS) 5 MG tablet Take 5 mg by mouth in the morning.  ? tamsulosin (FLOMAX) 0.4 MG CAPS capsule TAKE 1 CAPSULE AT BEDTIME FOR PROSTATE (Patient taking differently: Take 0.4 mg by mouth at bedtime.)  ? triamcinolone cream (KENALOG) 0.1 % Apply 1 application topically 2 (two) times daily as needed for itching.  ? Zinc 50 MG TABS Take 1 tablet Daily  ?  Olopatadine HCl 0.2 % SOLN Place 1 drop into both eyes daily as needed.  ? ?No current facility-administered medications on file prior to visit.  ? ? ? ?Allergies:  ?Allergies  ?Allergen Reactions  ? Allegra [Fexofenadine] Other (See Comments)  ?  Worsened insomnia  ? Benzoyl Peroxide   ?  blisters  ? Decadron [Dexamethasone]   ?  Reflux & Hiccups, GI upset  ? Doxycycline Nausea Only  ? Levaquin [Levofloxacin] Nausea And Vomiting  ?  Patient states that after he took levaquin this past time he had nausea with vomiting  ?  ? ?Medical History:  ?Past Medical History:  ?Diagnosis Date  ? Allergy   ? Chronic cough   ? Chronic sinusitis   ? Diverticulitis 2018  ? Mild  ? Diverticulosis of colon (without mention of hemorrhage)   ? Early satiety   ? Esophageal motility disorder 12/2018  ? Family history of adverse reaction to anesthesia   ? mom has PONV  ? GERD (gastroesophageal  reflux disease)   ? History of kidney stones   ? 2017  ? History of left inguinal hernia   ? Hypertension   ? Insomnia   ? Lyme disease   ? QUESTIONABLE  ? Pulmonary nodules 10/2018  ? Scattered tiny subpleural pulmona

## 2022-03-17 ENCOUNTER — Encounter: Payer: Self-pay | Admitting: Neurology

## 2022-03-17 LAB — CBC WITH DIFFERENTIAL/PLATELET
Absolute Monocytes: 570 cells/uL (ref 200–950)
Basophils Absolute: 20 cells/uL (ref 0–200)
Basophils Relative: 0.4 %
Eosinophils Absolute: 70 cells/uL (ref 15–500)
Eosinophils Relative: 1.4 %
HCT: 34.4 % — ABNORMAL LOW (ref 38.5–50.0)
Hemoglobin: 11.7 g/dL — ABNORMAL LOW (ref 13.2–17.1)
Lymphs Abs: 1425 cells/uL (ref 850–3900)
MCH: 31.7 pg (ref 27.0–33.0)
MCHC: 34 g/dL (ref 32.0–36.0)
MCV: 93.2 fL (ref 80.0–100.0)
MPV: 9.5 fL (ref 7.5–12.5)
Monocytes Relative: 11.4 %
Neutro Abs: 2915 cells/uL (ref 1500–7800)
Neutrophils Relative %: 58.3 %
Platelets: 191 10*3/uL (ref 140–400)
RBC: 3.69 10*6/uL — ABNORMAL LOW (ref 4.20–5.80)
RDW: 12.4 % (ref 11.0–15.0)
Total Lymphocyte: 28.5 %
WBC: 5 10*3/uL (ref 3.8–10.8)

## 2022-03-17 LAB — LIPID PANEL
Cholesterol: 121 mg/dL (ref <200)
HDL: 30 mg/dL — ABNORMAL LOW (ref >=40)
LDL Cholesterol (Calc): 67 mg/dL (calc)
Non-HDL Cholesterol (Calc): 91 mg/dL (calc) (ref <130)
Total CHOL/HDL Ratio: 4 (calc) (ref <5.0)
Triglycerides: 166 mg/dL — ABNORMAL HIGH (ref <150)

## 2022-03-17 LAB — COMPLETE METABOLIC PANEL WITH GFR
AG Ratio: 1.4 (calc) (ref 1.0–2.5)
ALT: 17 U/L (ref 9–46)
AST: 22 U/L (ref 10–35)
Albumin: 4.1 g/dL (ref 3.6–5.1)
Alkaline phosphatase (APISO): 61 U/L (ref 35–144)
BUN: 23 mg/dL (ref 7–25)
CO2: 26 mmol/L (ref 20–32)
Calcium: 8.7 mg/dL (ref 8.6–10.3)
Chloride: 102 mmol/L (ref 98–110)
Creat: 1.12 mg/dL (ref 0.70–1.35)
Globulin: 2.9 g/dL (calc) (ref 1.9–3.7)
Glucose, Bld: 89 mg/dL (ref 65–99)
Potassium: 4.4 mmol/L (ref 3.5–5.3)
Sodium: 138 mmol/L (ref 135–146)
Total Bilirubin: 0.4 mg/dL (ref 0.2–1.2)
Total Protein: 7 g/dL (ref 6.1–8.1)
eGFR: 74 mL/min/{1.73_m2} (ref >=60)

## 2022-03-17 LAB — HEMOGLOBIN A1C
Hgb A1c MFr Bld: 5.2 % of total Hgb (ref <5.7)
Mean Plasma Glucose: 103 mg/dL
eAG (mmol/L): 5.7 mmol/L

## 2022-03-18 ENCOUNTER — Other Ambulatory Visit (INDEPENDENT_AMBULATORY_CARE_PROVIDER_SITE_OTHER): Payer: Commercial Managed Care - PPO

## 2022-03-18 ENCOUNTER — Encounter: Payer: Self-pay | Admitting: Nurse Practitioner

## 2022-03-18 ENCOUNTER — Ambulatory Visit (INDEPENDENT_AMBULATORY_CARE_PROVIDER_SITE_OTHER): Payer: Commercial Managed Care - PPO | Admitting: Nurse Practitioner

## 2022-03-18 VITALS — BP 110/60 | HR 67 | Ht 69.0 in | Wt 174.0 lb

## 2022-03-18 DIAGNOSIS — K219 Gastro-esophageal reflux disease without esophagitis: Secondary | ICD-10-CM | POA: Diagnosis not present

## 2022-03-18 DIAGNOSIS — R059 Cough, unspecified: Secondary | ICD-10-CM

## 2022-03-18 DIAGNOSIS — D649 Anemia, unspecified: Secondary | ICD-10-CM | POA: Diagnosis not present

## 2022-03-18 LAB — IBC + FERRITIN
Ferritin: 156 ng/mL (ref 22.0–322.0)
Iron: 53 ug/dL (ref 42–165)
Saturation Ratios: 20.2 % (ref 20.0–50.0)
TIBC: 261.8 ug/dL (ref 250.0–450.0)
Transferrin: 187 mg/dL — ABNORMAL LOW (ref 212.0–360.0)

## 2022-03-18 LAB — HEMOGLOBIN: Hemoglobin: 12.1 g/dL — ABNORMAL LOW (ref 13.0–17.0)

## 2022-03-18 LAB — HEMATOCRIT: HCT: 34.6 % — ABNORMAL LOW (ref 39.0–52.0)

## 2022-03-18 LAB — B12 AND FOLATE PANEL
Folate: 20.9 ng/mL (ref >5.9)
Vitamin B-12: 872 pg/mL (ref 211–911)

## 2022-03-18 NOTE — Progress Notes (Signed)
? ? ? ?03/18/2022 ?Jay Brooks ?[id number] ?15-Dec-1960 ? ? ?Chief Complaint: Acid reflux, cough  ? ?History of Present Illness: Jay Brooks is a 61 year old male with a past medical history of hypertension, kidney stones, chronic cough, right pulmonary nodules, sleep apnea, GERD and diverticulitis.  He is known by Dr. Ardis Brooks.  He presents today for further evaluation regarding recurrent cough and increased burping for the past 2 weeks which he contributes to having acid reflux.  His cough is worse when his stomach is empty.  He coughed to the point of gagging which resulted in 1 episode of vomiting without recurrence.  No hematemesis.  He has seasonal allergies and had an extensive coughing episode 2 weeks ago while he was mowing his lawn which improved after using one of his inhalers. He is taking Flonase nasal spray daily for postnasal drip.  He was recently started on Flovent inhaler.  His most recent EGD was 05/02/2019 which was normal.  EGD 04/2019 was normal and a Bravo pH probe was placed at that time which did not show any significant evidence of acid regurgitation and his chronic cough was thought not to be related to GERD. He was previously on Olmesartan for hypertension which was recently discontinued secondary to having a recurrent cough. ? ?He has intermittent mushy stools which are brown in color otherwise passes a normal formed solid stool most days.  No rectal bleeding or black stools.  He is taking a fiber tablet a few days weekly.  His most recent colonoscopy was 07/28/2021 which showed diverticulosis in the left colon, small internal hemorrhoids otherwise was normal.  ? ?He complains of having fatigue for the past 2 weeks.  He was seen by his PCP 03/16/2022 and a CBC was done which showed a hemoglobin level of 11.7 (Hg 12.9 on 09/14/2021).  Hematocrit 34.4.  MCV 93.2.  Platelet 191.  BUN 23.  Creatinine 1.12.  Total bili 0.4.  Alk phos 61.  AST 22.  ALT 17.  No rectal bleeding or black stools.  No  NSAID use.  No fever, sweats or chills.  No weight loss ? ?Most recent endoscopic procedures: ? ?Colonoscopy 07/28/2021: ?- Diverticulosis in the left colon. ?- Small internal hemorrhoids. ?- The examination was otherwise normal on direct and retroflexion views. ?- No polyps or cancers. ? ?EGD 05/02/2019: ?Normal UGI tract. ?- Bravo pH probe was placed in good position 6cm above the GE junction (per protocol). The Bravo 48 hour pH test showed that he does NOT have pathologic amounts of acid regurgitation (DeMeester 0.4, normal <14.7).  Also there was VERY poor correlation between his reported symptoms and any rare regurg events that did occur. ? ?EGD 09/08/2017: ?- Normal esophagus. ?- Mild, non-specific gastritis, biopsied. ?- Normal examined duodenum. ?- MILD CHRONIC ACTIVE GASTRITIS. ?- THERE IS NO EVIDENCE OF HELICOBACTER PYLORI, DYSPLASIA, OR MALIGNANCY. ?- SEE COMMENT. ? ?Image studies: ? ?Chest CT 11/02/2018: ?1. No evidence of interstitial lung disease. ?2. Scattered tiny subpleural pulmonary nodules at the peripheral ?right lung base, largest 3 mm. No follow-up needed if patient is ?low-risk (and has no known or suspected primary neoplasm). ?Non-contrast chest CT can be considered in 12 months if patient is ?high-risk. ? ?Current Outpatient Medications on File Prior to Visit  ?Medication Sig Dispense Refill  ? albuterol (VENTOLIN HFA) 108 (90 Base) MCG/ACT inhaler 2 puffs    ? Azelastine HCl 137 MCG/SPRAY SOLN SMARTSIG:2 Spray(s) Both Nares Every Evening    ? bisoprolol-hydrochlorothiazide (  ZIAC) 2.5-6.25 MG tablet Take 1 tablet by mouth daily. 30 tablet 2  ? calcium carbonate (TUMS EX) 750 MG chewable tablet Chew 1 tablet by mouth daily.    ? cholecalciferol (VITAMIN D) 25 MCG (1000 UNIT) tablet Take 1,000 Units by mouth daily.    ? clindamycin (CLEOCIN T) 1 % lotion Apply 1 application topically every evening.    ? famotidine (PEPCID) 20 MG tablet Take 20 mg by mouth daily.    ? fluticasone (FLONASE) 50  MCG/ACT nasal spray Place 2 sprays into both nostrils daily.     ? fluticasone (FLOVENT HFA) 110 MCG/ACT inhaler Inhale into the lungs 2 (two) times daily.    ? hydrocortisone cream 1 % Apply 1 application topically daily as needed for itching.    ? ipratropium (ATROVENT) 0.06 % nasal spray SMARTSIG:1 Spray(s) Both Nares Twice Daily PRN    ? ketotifen (ZADITOR) 0.025 % ophthalmic solution Uses prn 5 mL 0  ? loratadine (CLARITIN) 10 MG tablet Take 10 mg by mouth daily.    ? montelukast (SINGULAIR) 10 MG tablet TAKE 1 TABLET BY MOUTH DAILY FOR ALLERGIES 30 tablet 3  ? Olopatadine HCl 0.2 % SOLN Place 1 drop into both eyes daily as needed.    ? OVER THE COUNTER MEDICATION Take 2 capsules by mouth at bedtime. Serenagen otc supplement    ? RABEprazole (ACIPHEX) 20 MG tablet Take 1 tablet (20 mg total) by mouth 2 (two) times daily. 180 tablet 1  ? tadalafil (CIALIS) 5 MG tablet Take 5 mg by mouth in the morning.    ? tamsulosin (FLOMAX) 0.4 MG CAPS capsule TAKE 1 CAPSULE AT BEDTIME FOR PROSTATE (Patient taking differently: Take 0.4 mg by mouth at bedtime.) 90 capsule 2  ? triamcinolone cream (KENALOG) 0.1 % Apply 1 application topically 2 (two) times daily as needed for itching.    ? Zinc 50 MG TABS Take 1 tablet Daily  0  ? ?No current facility-administered medications on file prior to visit.  ? ?Allergies  ?Allergen Reactions  ? Allegra [Fexofenadine] Other (See Comments)  ?  Worsened insomnia  ? Benzoyl Peroxide   ?  blisters  ? Decadron [Dexamethasone]   ?  Reflux & Hiccups, GI upset  ? Doxycycline Nausea Only  ? Levaquin [Levofloxacin] Nausea And Vomiting  ?  Patient states that after he took levaquin this past time he had nausea with vomiting  ? ?Current Medications, Allergies, Past Medical History, Past Surgical History, Family History and Social History were reviewed in [employer] record. ? ?Review of Systems:   ?Constitutional: Negative for fever, sweats, chills or weight loss.   ?Respiratory: Negative for shortness of breath.   ?Cardiovascular: Negative for chest pain, palpitations and leg swelling.  ?Gastrointestinal: See HPI.  ?Musculoskeletal: Negative for back pain or muscle aches.  ?Neurological: Negative for dizziness, headaches or paresthesias.  ? ?Physical Exam: ?BP 110/60   Pulse 67   Ht $R'5\' 9"'gY$  (1.753 m)   Wt 174 lb (78.9 kg)   BMI 25.70 kg/m?  ?General: 61 year old male pale complected in no acute distress. ?Head: Normocephalic and atraumatic. ?Eyes: No scleral icterus. Conjunctiva pink . ?Ears: Normal auditory acuity. ?Mouth: Dentition intact. No ulcers or lesions.  ?Lungs: Clear throughout to auscultation. ?Heart: Regular rate and rhythm, no murmur. ?Abdomen: Soft, nontender and nondistended. No masses or hepatomegaly. Normal bowel sounds x 4 quadrants.  ?Rectal: Deferred ?Musculoskeletal: Symmetrical with no gross deformities. ?Extremities: No edema. ?Neurological: Alert oriented x  4. No focal deficits.  ?Psychological: Alert and cooperative. Normal mood and affect ? ?Assessment and Recommendations: ? ?19) 61 year old male with recurrent chronic cough, etiology unclear. Prior endoscopic evaluation including 24 hr pH probe study without evidence of GERD/laryngeal reflux.  Possible ace inhibitor induced cough -> Olmesartan recently discontinued. History of allergies/asthma on Flonase and Azelastine nasal sprays and Flovent/Atrovent inhalers. History of tiny right lung nodules per CT 2019.  On PPI and H2 blocker daily. ?-Patient follow-up with PCP to consider repeat chest CT if cough persists ?-Unlikely acid reflux etiology for cough with unrevealing endoscopic evaluation including 24-hour pH study in 2020 ? ?2) Normocytic anemia. Hg 11.7. HCT 34.4. MCV 93.2. Normal iron and B12 levels 09/2021. No overt GI bleeding. Normal EGD 04/2019. Colonoscopy 07/2021 showed diverticulosis, no polyps.  ?-H/H. Iron panel, B12 and folate level ?-EGD to rule out UGI malignancy, atrophic  gastritis and celiac disease benefits and risks discussed including risk with sedation, risk of bleeding, perforation and infection  ?-PPI twice daily for now ?-Hematology evaluation if he is not iron deficient ? ?

## 2022-03-18 NOTE — Progress Notes (Signed)
I agree with the above note, plan 

## 2022-03-18 NOTE — Patient Instructions (Addendum)
You have been scheduled for a EGD. Please follow the written instructions given to you at your visit today. ?If you use inhalers (even only as needed), please bring them with you on the day of your procedure. ? ?Take Rabeprazole 20 mg 1 capsule twice a day. ? ?Continue Famotidine 40  MG at night. ? ?Follow up with primary care provider regarding the cough, may consider having a chest CT. ? ?Please proceed to the basement level for lab work before leaving today. Press "B" on the elevator. The lab is located at the first door on the left as you exit the elevator. ? ?HEALTHCARE LAWS AND MY CHART RESULTS:  ? ?Due to recent changes in healthcare laws, you may see results of your imaging and/or laboratory studies on MyChart before I have had a chance to review them.  I understand that in some cases there may be results that are confusing or concerning to you. Please understand that not all results are received at the same time and often I may need to interpret multiple results in order to provide you with the best plan of care or course of treatment. Therefore, I ask that you please give me 48 hours to thoroughly review all your results before contacting my office for clarification.  ? ? ?Thank you for trusting me with your gastrointestinal care!   ? ?Noralyn Pick, CRNP ? ? ? ?BMI: ? ?If you are age 63 or older, your body mass index should be between 23-30. Your Body mass index is 25.7 kg/m?Marland Kitchen If this is out of the aforementioned range listed, please consider follow up with your Primary Care Provider. ? ?If you are age 63 or younger, your body mass index should be between 19-25. Your Body mass index is 25.7 kg/m?Marland Kitchen If this is out of the aformentioned range listed, please consider follow up with your Primary Care Provider.  ? ?MY CHART: ? ?The [facility] providers would like to encourage you to use [facility] to communicate with providers for non-urgent requests or questions.  Due to long hold times on the  telephone, sending your provider a message by [facility] may be a faster and more efficient way to get a response.  Please allow 48 business hours for a response.  Please remember that this is for non-urgent requests.  ? ?

## 2022-03-22 NOTE — Progress Notes (Signed)
I agree with the above note, plan 

## 2022-03-30 ENCOUNTER — Encounter: Payer: Self-pay | Admitting: Gastroenterology

## 2022-03-30 ENCOUNTER — Ambulatory Visit (AMBULATORY_SURGERY_CENTER): Payer: Commercial Managed Care - PPO | Admitting: Gastroenterology

## 2022-03-30 VITALS — BP 92/65 | HR 50 | Temp 97.1°F | Resp 12 | Ht 69.0 in | Wt 174.0 lb

## 2022-03-30 DIAGNOSIS — K2970 Gastritis, unspecified, without bleeding: Secondary | ICD-10-CM | POA: Diagnosis not present

## 2022-03-30 DIAGNOSIS — K219 Gastro-esophageal reflux disease without esophagitis: Secondary | ICD-10-CM

## 2022-03-30 DIAGNOSIS — K319 Disease of stomach and duodenum, unspecified: Secondary | ICD-10-CM | POA: Diagnosis not present

## 2022-03-30 DIAGNOSIS — K317 Polyp of stomach and duodenum: Secondary | ICD-10-CM | POA: Diagnosis not present

## 2022-03-30 MED ORDER — SODIUM CHLORIDE 0.9 % IV SOLN: 500.0000 mL | Freq: Once | INTRAVENOUS | Status: DC

## 2022-03-30 NOTE — Patient Instructions (Signed)
Handout provided on gastritis.   Follow up with your primary MD about your mild, normocytic, hemoccult negative anemia.   YOU HAD AN ENDOSCOPIC PROCEDURE TODAY AT [facility]:   Refer to the procedure report that was given to you for any specific questions about what was found during the examination.  If the procedure report does not answer your questions, please call your gastroenterologist to clarify.  If you requested that your care partner not be given the details of your procedure findings, then the procedure report has been included in a sealed envelope for you to review at your convenience later.  YOU SHOULD EXPECT: Some feelings of bloating in the abdomen. Passage of more gas than usual.  Walking can help get rid of the air that was put into your GI tract during the procedure and reduce the bloating. If you had a lower endoscopy (such as a colonoscopy or flexible sigmoidoscopy) you may notice spotting of blood in your stool or on the toilet paper. If you underwent a bowel prep for your procedure, you may not have a normal bowel movement for a few days.  Please Note:  You might notice some irritation and congestion in your nose or some drainage.  This is from the oxygen used during your procedure.  There is no need for concern and it should clear up in a day or so.  SYMPTOMS TO REPORT IMMEDIATELY:  Following upper endoscopy (EGD)  Vomiting of blood or coffee ground material  New chest pain or pain under the shoulder blades  Painful or persistently difficult swallowing  New shortness of breath  Fever of 100F or higher  Black, tarry-looking stools  For urgent or emergent issues, a gastroenterologist can be reached at any hour by calling [phone]. Do not use MyChart messaging for urgent concerns.    DIET:  We do recommend a small meal at first, but then you may proceed to your regular diet.  Drink plenty of fluids but you should avoid alcoholic beverages for 24  hours.  ACTIVITY:  You should plan to take it easy for the rest of today and you should NOT DRIVE or use heavy machinery until tomorrow (because of the sedation medicines used during the test).    FOLLOW UP: Our staff will call the number listed on your records 48-72 hours following your procedure to check on you and address any questions or concerns that you may have regarding the information given to you following your procedure. If we do not reach you, we will leave a message.  We will attempt to reach you two times.  During this call, we will ask if you have developed any symptoms of COVID 19. If you develop any symptoms (ie: fever, flu-like symptoms, shortness of breath, cough etc.) before then, please call [phone].  If you test positive for Covid 19 in the 2 weeks post procedure, please call and report this information to [place].    If any biopsies were taken you will be contacted by phone or by letter within the next 1-3 weeks.  Please call us at [phone] if you have not heard about the biopsies in 3 weeks.    SIGNATURES/CONFIDENTIALITY: You and/or your care partner have signed paperwork which will be entered into your electronic medical record.  These signatures attest to the fact that that the information above on your After Visit Summary has been reviewed and is understood.  Full responsibility of the confidentiality of this discharge information  lies with you and/or your care-partner.

## 2022-03-30 NOTE — Progress Notes (Signed)
Pt's states no medical or surgical changes since previsit or office visit. 

## 2022-03-30 NOTE — Progress Notes (Signed)
Called to room to assist during endoscopic procedure.  Patient ID and intended procedure confirmed with present staff. Received instructions for my participation in the procedure from the performing physician.  

## 2022-03-30 NOTE — Progress Notes (Signed)
  The recent H&P (dated this month) was reviewed, the patient was examined and there is no change in the patients condition since that H&P was completed.   Milus Banister  03/30/2022, 9:12 AM

## 2022-03-30 NOTE — Progress Notes (Signed)
To pacu, VSS. Report to [email or web address] 

## 2022-03-30 NOTE — Op Note (Signed)
[facility] Patient Name: Jay Brooks Procedure Date: 03/30/2022 9:48 AM MRN: [id number] Endoscopist: Milus Banister , MD Age: 61 Referring MD:  Date of Birth: November 28, 1960 Gender: Male Account #: [id number] Procedure:                Upper GI endoscopy Indications:              Chronic cough Medicines:                Monitored Anesthesia Care Procedure:                Pre-Anesthesia Assessment:                           - Prior to the procedure, a History and Physical                            was performed, and patient medications and                            allergies were reviewed. The patient's tolerance of                            previous anesthesia was also reviewed. The risks                            and benefits of the procedure and the sedation                            options and risks were discussed with the patient.                            All questions were answered, and informed consent                            was obtained. Prior Anticoagulants: The patient has                            taken no previous anticoagulant or antiplatelet                            agents. ASA Grade Assessment: II - A patient with                            mild systemic disease. After reviewing the risks                            and benefits, the patient was deemed in                            satisfactory condition to undergo the procedure.                           After obtaining informed consent, the endoscope was  passed under direct vision. Throughout the                            procedure, the patient's blood pressure, pulse, and                            oxygen saturations were monitored continuously. The                            Olympus Endoscope [id number] was introduced through                            the mouth, and advanced to the second part of                            duodenum. The upper GI endoscopy was  accomplished                            without difficulty. The patient tolerated the                            procedure well. Scope In: Scope Out: Findings:                 Mild inflammation characterized by erythema and                            friability was found in the gastric antrum.                            Biopsies were taken with a cold forceps for                            histology.                           A few small (4-26m) soft, fleshy, classic appearing                            fundic gland polyps in the proximal stomach.                           The exam was otherwise without abnormality. Complications:            No immediate complications. Estimated blood loss:                            None. Estimated Blood Loss:     Estimated blood loss: none. Impression:               - Mild, non-specific distal gastritis. Biopsied to                            check for H. pylori.                           -  A few small (4-54m) soft, fleshy, classic                            appearing fundic gland polyps in the proximal                            stomach.                           - The examination was otherwise normal. Recommendation:           - Patient has a contact number available for                            emergencies. The signs and symptoms of potential                            delayed complications were discussed with the                            patient. Return to normal activities tomorrow.                            Written discharge instructions were provided to the                            patient.                           - Resume previous diet.                           - Continue present medications.                           - Await pathology results.                           - Follow up with your primary MD about your mild,                            normocytic, hemocult negative anemia. DMilus Banister MD 03/30/2022 10:01:42 AM This  report has been signed electronically.

## 2022-03-31 ENCOUNTER — Other Ambulatory Visit (INDEPENDENT_AMBULATORY_CARE_PROVIDER_SITE_OTHER): Payer: Commercial Managed Care - PPO

## 2022-03-31 ENCOUNTER — Telehealth: Payer: Self-pay

## 2022-03-31 DIAGNOSIS — Z1211 Encounter for screening for malignant neoplasm of colon: Secondary | ICD-10-CM | POA: Diagnosis not present

## 2022-03-31 DIAGNOSIS — Z1212 Encounter for screening for malignant neoplasm of rectum: Secondary | ICD-10-CM | POA: Diagnosis not present

## 2022-03-31 LAB — POC HEMOCCULT BLD/STL (HOME/3-CARD/SCREEN)
Card #2 Fecal Occult Blod, POC: NEGATIVE
Card #3 Fecal Occult Blood, POC: NEGATIVE
Fecal Occult Blood, POC: NEGATIVE

## 2022-03-31 NOTE — Telephone Encounter (Signed)
  Follow up Call-     03/30/2022    9:06 AM 07/28/2021    8:41 AM  Call back number  Post procedure Call Back phone  # [phone]  Permission to leave phone message Yes Yes     Patient questions:  Do you have a fever, pain , or abdominal swelling? No. Pain Score  0 *  Have you tolerated food without any problems? Yes.    Have you been able to return to your normal activities? Yes.    Do you have any questions about your discharge instructions: Diet   No. Medications  No. Follow up visit  No.  Do you have questions or concerns about your Care? No.  Actions: * If pain score is 4 or above: No action needed, pain <4.

## 2022-04-05 ENCOUNTER — Encounter: Payer: Self-pay | Admitting: Gastroenterology

## 2022-04-10 NOTE — Progress Notes (Unsigned)
Assessment and Plan:  There are no diagnoses linked to this encounter.    Further disposition pending results of labs. Discussed med's effects and SE's.   Over 30 minutes of exam, counseling, chart review, and critical decision making was performed.   Future Appointments  Date Time Provider [place]  04/11/2022  3:30 PM Magda Bernheim, NP [facility] None  05/17/2022  9:30 AM Dohmeier, Asencion Partridge, MD [facility] None  09/14/2022  2:00 PM Unk Pinto, MD [facility] None    ------------------------------------------------------------------------------------------------------------------   HPI There were no vitals taken for this visit. 61 y.o.male presents for  Past Medical History:  Diagnosis Date   Allergy    Chronic cough    Chronic sinusitis    Diverticulitis 2018   Mild   Diverticulosis of colon (without mention of hemorrhage)    Early satiety    Esophageal motility disorder 12/2018   Family history of adverse reaction to anesthesia    mom has PONV   GERD (gastroesophageal reflux disease)    History of kidney stones    2017   History of left inguinal hernia    Hypertension    Insomnia    Lyme disease    QUESTIONABLE   Pulmonary nodules 10/2018   Scattered tiny subpleural pulmonary nodules at the peripheral right lung base, largest 3 mm   Right inguinal hernia 2017   Small fat containing right inguinal hernia   Sleep apnea    no CPAP   Sleep deficient    Testosterone deficiency    Vitamin D deficiency    Wears glasses      Allergies  Allergen Reactions   Allegra [Fexofenadine] Other (See Comments)    Worsened insomnia   Benzoyl Peroxide     blisters   Decadron [Dexamethasone]     Reflux & Hiccups, GI upset   Doxycycline Nausea Only   Levaquin [Levofloxacin] Nausea And Vomiting    Patient states that after he took levaquin this past time he had nausea with vomiting    Current Outpatient Medications on File Prior to Visit  Medication Sig   albuterol  (VENTOLIN HFA) 108 (90 Base) MCG/ACT inhaler 2 puffs   Azelastine HCl 137 MCG/SPRAY SOLN SMARTSIG:2 Spray(s) Both Nares Every Evening   bisoprolol-hydrochlorothiazide (ZIAC) 2.5-6.25 MG tablet Take 1 tablet by mouth daily.   calcium carbonate (TUMS EX) 750 MG chewable tablet Chew 1 tablet by mouth daily.   cholecalciferol (VITAMIN D) 25 MCG (1000 UNIT) tablet Take 1,000 Units by mouth daily.   clindamycin (CLEOCIN T) 1 % lotion Apply 1 application topically every evening.   famotidine (PEPCID) 20 MG tablet Take 20 mg by mouth daily.   fluticasone (FLONASE) 50 MCG/ACT nasal spray Place 2 sprays into both nostrils daily.    fluticasone (FLOVENT HFA) 110 MCG/ACT inhaler Inhale into the lungs 2 (two) times daily.   hydrocortisone cream 1 % Apply 1 application topically daily as needed for itching.   ipratropium (ATROVENT) 0.06 % nasal spray SMARTSIG:1 Spray(s) Both Nares Twice Daily PRN   ketotifen (ZADITOR) 0.025 % ophthalmic solution Uses prn   loratadine (CLARITIN) 10 MG tablet Take 10 mg by mouth daily.   montelukast (SINGULAIR) 10 MG tablet TAKE 1 TABLET BY MOUTH DAILY FOR ALLERGIES   Olopatadine HCl 0.2 % SOLN Place 1 drop into both eyes daily as needed.   OVER THE COUNTER MEDICATION Take 2 capsules by mouth at bedtime. Serenagen otc supplement   RABEprazole (ACIPHEX) 20 MG tablet Take 1 tablet (20 mg  total) by mouth 2 (two) times daily.   tadalafil (CIALIS) 5 MG tablet Take 5 mg by mouth in the morning.   tamsulosin (FLOMAX) 0.4 MG CAPS capsule TAKE 1 CAPSULE AT BEDTIME FOR PROSTATE (Patient taking differently: Take 0.4 mg by mouth at bedtime.)   triamcinolone cream (KENALOG) 0.1 % Apply 1 application topically 2 (two) times daily as needed for itching.   Zinc 50 MG TABS Take 1 tablet Daily   No current facility-administered medications on file prior to visit.    ROS: all negative except above.   Physical Exam:  There were no vitals taken for this visit.  General Appearance: Well  nourished, in no apparent distress. Eyes: PERRLA, EOMs, conjunctiva no swelling or erythema Sinuses: No Frontal/maxillary tenderness ENT/Mouth: Ext aud canals clear, TMs without erythema, bulging. No erythema, swelling, or exudate on post pharynx.  Tonsils not swollen or erythematous. Hearing normal.  Neck: Supple, thyroid normal.  Respiratory: Respiratory effort normal, BS equal bilaterally without rales, rhonchi, wheezing or stridor.  Cardio: RRR with no MRGs. Brisk peripheral pulses without edema.  Abdomen: Soft, + BS.  Non tender, no guarding, rebound, hernias, masses. Lymphatics: Non tender without lymphadenopathy.  Musculoskeletal: Full ROM, 5/5 strength, normal gait.  Skin: Warm, dry without rashes, lesions, ecchymosis.  Neuro: Cranial nerves intact. Normal muscle tone, no cerebellar symptoms. Sensation intact.  Psych: Awake and oriented X 3, normal affect, Insight and Judgment appropriate.     Magda Bernheim, NP 12:23 PM [facility]

## 2022-04-11 ENCOUNTER — Encounter: Payer: Self-pay | Admitting: Nurse Practitioner

## 2022-04-11 ENCOUNTER — Ambulatory Visit (INDEPENDENT_AMBULATORY_CARE_PROVIDER_SITE_OTHER): Payer: Commercial Managed Care - PPO | Admitting: Nurse Practitioner

## 2022-04-11 VITALS — BP 122/80 | HR 50 | Temp 97.5°F | Wt 173.0 lb

## 2022-04-11 DIAGNOSIS — I10 Essential (primary) hypertension: Secondary | ICD-10-CM

## 2022-04-11 DIAGNOSIS — D508 Other iron deficiency anemias: Secondary | ICD-10-CM | POA: Diagnosis not present

## 2022-04-11 LAB — CBC WITH DIFFERENTIAL/PLATELET
Absolute Monocytes: 655 cells/uL (ref 200–950)
Basophils Absolute: 10 cells/uL (ref 0–200)
Basophils Relative: 0.2 %
Eosinophils Absolute: 50 cells/uL (ref 15–500)
Eosinophils Relative: 1 %
HCT: 39.5 % (ref 38.5–50.0)
Hemoglobin: 13.4 g/dL (ref 13.2–17.1)
Lymphs Abs: 1345 cells/uL (ref 850–3900)
MCH: 31.7 pg (ref 27.0–33.0)
MCHC: 33.9 g/dL (ref 32.0–36.0)
MCV: 93.4 fL (ref 80.0–100.0)
MPV: 9.6 fL (ref 7.5–12.5)
Monocytes Relative: 13.1 %
Neutro Abs: 2940 cells/uL (ref 1500–7800)
Neutrophils Relative %: 58.8 %
Platelets: 166 10*3/uL (ref 140–400)
RBC: 4.23 10*6/uL (ref 4.20–5.80)
RDW: 12.5 % (ref 11.0–15.0)
Total Lymphocyte: 26.9 %
WBC: 5 10*3/uL (ref 3.8–10.8)

## 2022-04-22 ENCOUNTER — Other Ambulatory Visit: Payer: Self-pay

## 2022-04-22 MED ORDER — RABEPRAZOLE SODIUM 20 MG PO TBEC: 20.0000 mg | DELAYED_RELEASE_TABLET | Freq: Two times a day (BID) | ORAL | 3 refills | Status: DC

## 2022-05-17 ENCOUNTER — Encounter: Payer: Self-pay | Admitting: Neurology

## 2022-05-17 ENCOUNTER — Ambulatory Visit: Payer: Commercial Managed Care - PPO | Admitting: Neurology

## 2022-05-17 VITALS — BP 118/72 | HR 64 | Ht 69.0 in | Wt 172.0 lb

## 2022-05-17 DIAGNOSIS — F5104 Psychophysiologic insomnia: Secondary | ICD-10-CM | POA: Diagnosis not present

## 2022-05-17 DIAGNOSIS — Z9889 Other specified postprocedural states: Secondary | ICD-10-CM | POA: Diagnosis not present

## 2022-05-17 DIAGNOSIS — Z0289 Encounter for other administrative examinations: Secondary | ICD-10-CM | POA: Diagnosis not present

## 2022-05-17 DIAGNOSIS — Z789 Other specified health status: Secondary | ICD-10-CM | POA: Diagnosis not present

## 2022-05-17 NOTE — Progress Notes (Signed)
[facility]EP [facility]    Provider:  Larey Seat, MD  Primary Care Physician:  Unk Pinto, MD [address] [place] [address]     Referring Provider: Unk Pinto, [place],  [place] [address]          Chief Complaint according to patient   Patient presents with:     New Patient (Initial Visit)     pt alone, rm 10. presents today for concerns of chronic sleep concerns and [place] regulations- . on dental device, Dr Tyson Alias, DDS, [place].  Pt presents for follow up. Still has daytime sleepiness. Had a repeat SS in April. He working with dentist to have adjustments made to dental device.    RV for Jay Brooks, 05-17-2022; Jay. Jay Brooks. Jay Brooks has been diagnosed with obstructive sleep apnea, did not find satisfaction in CPAP treatment, mainly because he is a short sleeper and could not fulfill the criteria of CPAP compliance easily.  He then was referred to Dr. Toy Cookey where he received a dental device which he has slowly advanced. Preoperative home sleep test to see if the patient has derived measurable benefit.  A previous sleep study showed that when he sleeps on his right side his AHI was only 7.6/h so this will help to reduce it.  He had seen Dr. Benjamine Mola in ENT for nasal patency and underwent surgery.  Home sleep test in early 2022 showed an AHI of 31.3 and was low was dependent on supine sleep position.  His 02-16-2022 repeat HST confirms the presence of non-REM sleep dependent obstructive sleep apnea, not associated with hypoxia.   Mild snoring was present.   The degree of sleep apnea was worse when sleeping in supine and was mild while sleeping in nonsupine position.   Using the 4% desaturation scale, this patient's apnea is mild at an AHI of 8.1/h. there were no central apneas.  The patient is being seen at [facility], and loaded an app to his phone- snore lab. He has lower snoring volume on the new  setting of his dental device. He will have a follow up HST through the dentist.  He has still insomnia, but denies the correlation to the pandemic. He is still reporting fatigue: started B 12 daily po, D 3 6000 u/daily. Testosterone is low. Has anemia. Perhaps since testosterone was d/c 18 months ago. He also gained belly weight.              RV after ENT surgery and HST - now using a dental device -  he reports now on 12-27-2021 that he is fatigued and sleeps just as good or poorly as he did without.  Has less apprehension about going to sleep since he is on dental device. Trazodone hasn't worked, insomnia remains a problem.  He has trouble to keep his eyes open in daytime.  He wonders how far he needs to adjust his dental device/ he was left to his own device- reached 3.5 mm total by now- 0.5 mm per week. I like to offer the HST , and have him wear the device while  recording.  He is now followed by Dr Toy Cookey, DDS, and uses a HERBST.       11-11-2021.   FAA criteria to be met for this patient with residual mild apnea- Patient saw Dr. Benjamine Mola in follow up after ENT surgery, for nasal airway patency and should be healed enough  to try CPAP -if still needed. Jay Brooks had undergone a home sleep test 12 months ago which documented an AHI of 31.3 at the time and was strongly dependent on supine sleep position. Jay Brooks's ENT surgery has been reducing the AHI significantly- only MILD APNEA was LEFT-This gave this patient several options: 1)  The patient to free to try auto CPAP with a nasal pillow, mainly because this is still very REM dominant apnea.  2) Alternatively, if CPAP intolerant, he can be using inspire to reduce apnea, a dental device can reduce apnea, and as long as he sleeps on his side- he has a right lateral sleep position AHI of only 7.6/h.  The patient decided on the dental device and tries to avoid supine sleep . His dental device by DDS Dr Ronnald Ramp was implemented and is still in  titration. He is sensitive to the tightening of the mandibular advancement and tolerates 1/4 turn a week, about 1 mm.   We have a form to sign for coverage by health insurance.  He now uses a fit bit- there is indication of fragmented sleep until 1 Am and then about 5-6 hours of more sleep most nights- not bad!    Epworth Sleepiness Score 8/ 24, FSS at 15/ 63       HISTORY OF PRESENT ILLNESS:    05-19-2021:  Jay Brooks is a 61 y.o. year old  Caucasian male patient seen here In a RV 05/17/2022 : Jay Brooks just underwent a nasal septoplasty with bilateral turbinate reduction I have the detailed note from Dr. Flossie Buffy dated 04-21-2021 and he recently saw the surgeon again and has been told that he is 90% healed at this time.  If his nasal patency is significantly improved we should retest him if apnea is still present.  Sleep apnea is a symptom of mouth breathing patient's and CPAP helps to keep an air splint open in the back of the throat and allowing airflow this way CPAP also relies on nasal passage to be effectively applying pressure.  So at this time I would like to know where the new apnea degree may be after the surgery and if we need to continue with treatment of apnea or if a dental device can then be used instead.  So I will order a home sleep test as a control test I am booked out for at least 3 weeks so it will be enough to work sleep early August weeks probably.  And I like for the patient to be completely healed before we would reinitiate any CPAP should that be the recommendation of the sleep study.   Chronic insomnia , previous evaluations have not found an organic cause.  I would recommend a cognitive behaviour therapy if our repeat sleep study gives similar results.  Sinus CT was normal. Dr Benjamine Mola.  FAA - he is required to treat his apnea- and can't tolerate CPAP- worsened his Insomnia. He only can use it 4 hours at night- FAA wants to see 6 hours at night.  He does not take sleep aids  that were provided- he has not been compliant with either. He cannot initiate sleep- !      07-15-2020 The patient underwent a home sleep test by watch pat this was his third sleep study ,  This home sleep test documented an AHI of 31.3 strongly dependent on supine sleep position also sleeping on your back.  Sleep he usually avoids that at home, there was not  much REM sleep dependency noted there was no clinical significant hypoxemia noted and he had a normal pulse range variability between 45 and 110 bpm this is entirely normal for age and gender.  Since a home sleep test that the documented severe OSA he just tipped the 13th apnea marked without any central apneas.  He felt that CPAP would be the best option however CPAP has not been helpful with his second main sleep disorder chronic insomnia.  His AHI is compliant use of CPAP has been 0.7 so this is a very significant reduction the 95th percentile pressure he needs is only 6 cmH2O minimum pressure was set at 5 maximum pressure at 15 and median usage on days used is just 4 hours 2 minutes.  However there were several days where the machine was not used at all.  What I like to do is he has tried several masks but has not found any of them very comfortable, his medication list is fairly unchanged.  He endorsed the Epworth sleepiness score at 3 points and the fatigue severity at 9 points.   My question for the patient is if he would rather consider an inspire device than using CPAP.  He reports being unhappy with CPAP.       See first 80-02 2021 from dr. Melford Aase, his PCP.  Chief concern according to patient :  I can't fall asleep and have trouble with fatigue " . As long as a stay active and stimulated, I wont get sleepy.  I yawn a lot while driving"   I have the pleasure of seeing Jay Brooks , a right handed  Caucasian male with a possible chronic sleep disorder.  he developed a chronic insomnia, chronic cough, head pressure, lightheadedness.   He  has a past medical history of Allergy, Chronic cough, Chronic sinusitis, Diverticulitis (2018), Diverticulosis of colon (without mention of hemorrhage), Early satiety, Esophageal motility disorder (12/2018), Family history of adverse reaction to anesthesia, GERD (gastroesophageal reflux disease), History of kidney stones, History of left inguinal hernia, Hypertension, Insomnia, Lyme disease, Pulmonary nodules (10/2018), Right inguinal hernia (2017), Sleep apnea, Sleep deficient, Testosterone deficiency, Vitamin D deficiency, and Wears glasses.    He has chronic insomnia problems, he uses Benadryl. 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 He flies all  domestic, [place]) The patient had the first sleep study in the year 2005 with a negative result of "no apnea present"    Sleep relevant medical history: Nocturia once, no Tonsillectomy. Head injury in childhood.   Family medical /sleep history:mother died of pancreatic cancer.  no other family member with insomnia, maternal uncle was obese and had OSA.   Social history:  Patient is working as a Insurance underwriter and out-of work since 08-2018 and lives in a household with spouse and a college age daughter is home right now. Family status is married  With one daughter. The patient currently is out- of work.  Pets are present. One cat.  Tobacco use: none .  ETOH use : seldomly ,  Caffeine intake in form of Coffee( in AM ) Soda( lunchtime) Tea ( afternoon ) no energy drinks. Regular exercise none.   Sleep habits are as follows: The patient's dinner time is between 6 PM. The patient goes to bed at 11.30 PM and he often sleeps not before midnight. continues  to sleep for 4-6 hours, wakes for one bathroom break.   The bedroom is cool, quiet and dark- wife snores. TV is not used .  The preferred sleep position is right sided. , with the support of  1 pillow. Dreams are reportedly rare. He sometimes yells. 6-7  AM is the usual rise time. The patient wakes up spontaneously.  He  reports not feeling refreshed or restored in AM, with symptoms such as dry mouth, morning headaches and sinus pressure , and residual fatigue.  Naps are taken infrequently.     Review of Systems: Out of a complete 14 system review, the patient complains of only the following symptoms, and all other reviewed systems are negative.:  Fatigue, sleepiness , snoring, fragmented sleep, Insomnia.   How likely are you to doze in the following situations: 0 = not likely, 1 = slight chance, 2 = moderate chance, 3 = high chance   Sitting and Reading? Watching Television? Sitting inactive in a public place (theater or meeting)? As a passenger in a car for an hour without a break? Lying down in the afternoon when circumstances permit? Sitting and talking to someone? Sitting quietly after lunch without alcohol? In a car, while stopped for a few minutes in traffic?   Total = 05-17-2022: 14 / 24  from 8/ 24 points   FSS endorsed at 34/63 up from  15 - and that was down from 38/ 63 points in 2022.   Fatigue and EDS have been back to the pre therapy range .  Social History   Socioeconomic History   Marital status: Married    Spouse name: Not on file   Number of children: 1   Years of education: Not on file   Highest education level: Not on file  Occupational History   Occupation: PILOT    Employer: [employer]  Tobacco Use   Smoking status: Never   Smokeless tobacco: Never  Vaping Use   Vaping Use: Never used  Substance and Sexual Activity   Alcohol use: Yes    Alcohol/week: 2.0 - 3.0 standard drinks of alcohol    Types: 2 - 3 Cans of beer per week   Drug use: No   Sexual activity: Not on file  Other Topics Concern   Not on file  Social History Narrative   Daily caffeine    Social Determinants of Health   Financial Resource Strain: Not on file  Food  Insecurity: Not on file  Transportation Needs: Not on file  Physical Activity: Not on file  Stress: Not on file  Social Connections: Not on file    Family History  Problem Relation Age of Onset   Breast cancer Maternal Grandmother    Colon polyps Brother    Colon polyps Maternal Uncle    Cancer Mother 42       pancreatic   Colon cancer Neg Hx    Esophageal cancer Neg Hx    Stomach cancer Neg Hx    Ulcerative colitis Neg Hx     Past Medical History:  Diagnosis Date   Allergy    Chronic cough    Chronic sinusitis    Diverticulitis 2018   Mild   Diverticulosis of colon (without mention of hemorrhage)    Early satiety    Esophageal motility disorder 12/2018   Family history of adverse reaction to anesthesia    mom has PONV   GERD (gastroesophageal reflux disease)    History of kidney stones  2017   History of left inguinal hernia    Hypertension    Insomnia    Lyme disease    QUESTIONABLE   Pulmonary nodules 10/2018   Scattered tiny subpleural pulmonary nodules at the peripheral right lung base, largest 3 mm   Right inguinal hernia 2017   Small fat containing right inguinal hernia   Sleep apnea    no CPAP   Sleep deficient    Testosterone deficiency    Vitamin D deficiency    Wears glasses     Past Surgical History:  Procedure Laterality Date   BRAVO [place] STUDY N/A 05/02/2019   Procedure: BRAVO [place] STUDY;  Surgeon: Milus Banister, MD;  Location: [facility];  Service: Endoscopy;  Laterality: N/A;   COLONOSCOPY  02/2011   ESOPHAGOGASTRODUODENOSCOPY (EGD) WITH PROPOFOL N/A 05/02/2019   Procedure: ESOPHAGOGASTRODUODENOSCOPY (EGD) WITH PROPOFOL;  Surgeon: Milus Banister, MD;  Location: [facility];  Service: Endoscopy;  Laterality: N/A;   HERNIA REPAIR     NASAL SEPTOPLASTY W/ TURBINOPLASTY Bilateral 04/21/2021   Procedure: NASAL SEPTOPLASTY WITH  BILATERAL TURBINATE REDUCTION;  Surgeon: Leta Baptist, MD;  Location: [facility];  Service: ENT;  Laterality: Bilateral;    TOE SURGERY     INGROWN TOE NAIL, CHILDHOOD   UPPER GASTROINTESTINAL ENDOSCOPY     VASECTOMY       Current Outpatient Medications on File Prior to Visit  Medication Sig Dispense Refill   albuterol (VENTOLIN HFA) 108 (90 Base) MCG/ACT inhaler 2 puffs     Azelastine HCl 137 MCG/SPRAY SOLN SMARTSIG:2 Spray(s) Both Nares Every Evening     bisoprolol-hydrochlorothiazide (ZIAC) 2.5-6.25 MG tablet Take 1 tablet by mouth daily. 30 tablet 2   calcium carbonate (TUMS EX) 750 MG chewable tablet Chew 1 tablet by mouth daily.     cholecalciferol (VITAMIN D) 25 MCG (1000 UNIT) tablet Take 1,000 Units by mouth daily.     clindamycin (CLEOCIN T) 1 % lotion Apply 1 application topically every evening.     famotidine (PEPCID) 20 MG tablet Take 20 mg by mouth daily.     fluticasone (FLONASE) 50 MCG/ACT nasal spray Place 2 sprays into both nostrils daily.      fluticasone (FLOVENT HFA) 110 MCG/ACT inhaler Inhale into the lungs 2 (two) times daily.     hydrocortisone cream 1 % Apply 1 application topically daily as needed for itching.     ipratropium (ATROVENT) 0.06 % nasal spray SMARTSIG:1 Spray(s) Both Nares Twice Daily PRN     ketotifen (ZADITOR) 0.025 % ophthalmic solution Uses prn 5 mL 0   loratadine (CLARITIN) 10 MG tablet Take 10 mg by mouth daily.     montelukast (SINGULAIR) 10 MG tablet TAKE 1 TABLET BY MOUTH DAILY FOR ALLERGIES 30 tablet 3   Olopatadine HCl 0.2 % SOLN Place 1 drop into both eyes daily as needed.     OVER THE COUNTER MEDICATION Take 2 capsules by mouth at bedtime. Serenagen otc supplement     RABEprazole (ACIPHEX) 20 MG tablet Take 1 tablet (20 mg total) by mouth 2 (two) times daily. 180 tablet 3   tamsulosin (FLOMAX) 0.4 MG CAPS capsule TAKE 1 CAPSULE AT BEDTIME FOR PROSTATE (Patient taking differently: Take 0.4 mg by mouth at bedtime.) 90 capsule 2   triamcinolone cream (KENALOG) 0.1 % Apply 1 application topically 2 (two) times daily as needed for itching.     Zinc 50 MG TABS  Take 1 tablet Daily  0   No current facility-administered medications  on file prior to visit.    Allergies  Allergen Reactions   Allegra [Fexofenadine] Other (See Comments)    Worsened insomnia   Benzoyl Peroxide     blisters   Decadron [Dexamethasone]     Reflux & Hiccups, GI upset   Doxycycline Nausea Only   Levaquin [Levofloxacin] Nausea And Vomiting    Patient states that after he took levaquin this past time he had nausea with vomiting    Physical exam:  Today's Vitals   05/17/22 0916  BP: 118/72  Pulse: 64  Weight: 172 lb (78 kg)  Height: $Remove'5\' 9"'IcOMjZe$  (1.753 m)   Body mass index is 25.4 kg/m.   Wt Readings from Last 3 Encounters:  05/17/22 172 lb (78 kg)  04/11/22 173 lb (78.5 kg)  03/30/22 174 lb (78.9 kg)     Ht Readings from Last 3 Encounters:  05/17/22 $RemoveB'5\' 9"'hoyLZMnv$  (1.753 m)  03/30/22 $RemoveB'5\' 9"'xbgUlRPc$  (1.753 m)  03/18/22 $RemoveB'5\' 9"'bkYtICNT$  (1.753 m)      General: The patient is awake, alert and appears not in acute distress. The patient is well groomed. Head: Normocephalic, atraumatic. Neck is supple. Mallampati 2- very exaggerated gag reflex. ,  neck circumference: 15.25 inches, reduced by an inch.  . Nasal airflow patent.  Retrognathia is seen.   Cardiovascular:  Regular rate and cardiac rhythm by pulse,  without distended neck veins. Respiratory: Lungs are clear to auscultation.  Skin:  Without evidence of ankle edema, or rash. Trunk: The patient's posture is erect.   Neurologic exam : The patient is awake and alert, oriented to place and time.   Memory subjective described as intact.  Attention span & concentration ability appears normal.  Speech is fluent,  without  dysarthria, dysphonia or aphasia.  Mood and affect are appropriate.   Cranial nerves: no loss of smell or taste reported  Pupils are equal and briskly reactive to light. Funduscopic exam deferred.  Extraocular movements in vertical and horizontal planes were intact and without nystagmus. No Diplopia. senistivity to  sinus pressure points around the orbit. Facial motor strength is symmetric and tongue and uvula move midline.  Neck ROM : rotation, tilt and flexion extension were normal for age and shoulder shrug was symmetrical.    Motor exam:  Symmetric bulk, tone and ROM.   Normal tone without cog wheeling, symmetric grip strength .   Sensory:  Fine touch, pinprick and vibration were tested  and  normal.  Coordination: no evidence of ataxia, dysmetria or tremor. Gait and station: Patient could rise unassisted from a seated position, walked without assistive device.  Deep tendon reflexes: in the upper and lower extremities are symmetric and intact.  Babinski response was deferred.       After spending a total time of 30 minutes face to face and additional time for physical and neurologic examination, review of laboratory studies,  personal review of imaging studies, reports and results of other testing and review of referral information / records as far as provided in visit, I have established the following assessments:   Insomnia is sleep initiation insomnia - Trazodone is still an option.Apnea treatment is now by dental device , early in the course. Here to f/u on dental device.  Dr Ronnald Ramp made his dental appliance December 2022, he developed some jaw pain after increasing the force too much. He now switched to Sleep solution.  Insomnia was addressed with CBT, and has not improved.    My Plan is to proceed with:  Recommend  to see Dr. Jones Broom for anemia, testosterone deficiency.  This may improve fatigue  Insomnia is independent of Apnea.  He now switched to Sleep solution.    Electronically signed by: Larey Seat, MD 05/17/2022 9:23 AM  [facility] Board certified by The [employer] of Sleep Medicine and Diplomate of the [employer] of Sleep Medicine. Board certified In Neurology through the [place], Fellow of the [employer] of  Neurology. Medical Director of [facility].

## 2022-05-17 NOTE — Patient Instructions (Signed)
Cognitive behavior therapy for Insomnia Insomnia is a sleep disorder that makes it difficult to fall asleep or stay asleep. Insomnia can cause fatigue, low energy, difficulty concentrating, mood swings, and poor performance at work or school. There are three different ways to classify insomnia: Difficulty falling asleep. Difficulty staying asleep. Waking up too early in the morning. Any type of insomnia can be long-term (chronic) or short-term (acute). Both are common. Short-term insomnia usually lasts for 3 months or less. Chronic insomnia occurs at least three times a week for longer than 3 months. What are the causes? Insomnia may be caused by another condition, situation, or substance, such as: Having certain mental health conditions, such as anxiety and depression. Using caffeine, alcohol, tobacco, or drugs. Having gastrointestinal conditions, such as gastroesophageal reflux disease (GERD). Having certain medical conditions. These include: Asthma. Alzheimer's disease. Stroke. Chronic pain. An overactive thyroid gland (hyperthyroidism). Other sleep disorders, such as restless legs syndrome and sleep apnea. Menopause. Sometimes, the cause of insomnia may not be known. What increases the risk? Risk factors for insomnia include: Gender. Females are affected more often than males. Age. Insomnia is more common as people get older. Stress and certain medical and mental health conditions. Lack of exercise. Having an irregular work schedule. This may include working night shifts and traveling between different time zones. What are the signs or symptoms? If you have insomnia, the main symptom is having trouble falling asleep or having trouble staying asleep. This may lead to other symptoms, such as: Feeling tired or having low energy. Feeling nervous about going to sleep. Not feeling rested in the morning. Having trouble concentrating. Feeling irritable, anxious, or depressed. How is  this diagnosed? This condition may be diagnosed based on: Your symptoms and medical history. Your health care provider may ask about: Your sleep habits. Any medical conditions you have. Your mental health. A physical exam. How is this treated? Treatment for insomnia depends on the cause. Treatment may focus on treating an underlying condition that is causing the insomnia. Treatment may also include: Medicines to help you sleep. Counseling or therapy. Lifestyle adjustments to help you sleep better. Follow these instructions at home: Eating and drinking  Limit or avoid alcohol, caffeinated beverages, and products that contain nicotine and tobacco, especially close to bedtime. These can disrupt your sleep. Do not eat a large meal or eat spicy foods right before bedtime. This can lead to digestive discomfort that can make it hard for you to sleep. Sleep habits  Keep a sleep diary to help you and your health care provider figure out what could be causing your insomnia. Write down: When you sleep. When you wake up during the night. How well you sleep and how rested you feel the next day. Any side effects of medicines you are taking. What you eat and drink. Make your bedroom a dark, comfortable place where it is easy to fall asleep. Put up shades or blackout curtains to block light from outside. Use a white noise machine to block noise. Keep the temperature cool. Limit screen use before bedtime. This includes: Not watching TV. Not using your smartphone, tablet, or computer. Stick to a routine that includes going to bed and waking up at the same times every day and night. This can help you fall asleep faster. Consider making a quiet activity, such as reading, part of your nighttime routine. Try to avoid taking naps during the day so that you sleep better at night. Get out of bed if you  are still awake after 15 minutes of trying to sleep. Keep the lights down, but try reading or doing a  quiet activity. When you feel sleepy, go back to bed. General instructions Take over-the-counter and prescription medicines only as told by your health care provider. Exercise regularly as told by your health care provider. However, avoid exercising in the hours right before bedtime. Use relaxation techniques to manage stress. Ask your health care provider to suggest some techniques that may work well for you. These may include: Breathing exercises. Routines to release muscle tension. Visualizing peaceful scenes. Make sure that you drive carefully. Do not drive if you feel very sleepy. Keep all follow-up visits. This is important. Contact a health care provider if: You are tired throughout the day. You have trouble in your daily routine due to sleepiness. You continue to have sleep problems, or your sleep problems get worse. Get help right away if: You have thoughts about hurting yourself or someone else. Get help right away if you feel like you may hurt yourself or others, or have thoughts about taking your own life. Go to your nearest emergency room or: Call 911. Call the [facility] at (210)136-0536 or 988. This is open 24 hours a day. Text the Crisis Text Line at (762)754-6056. Summary Insomnia is a sleep disorder that makes it difficult to fall asleep or stay asleep. Insomnia can be long-term (chronic) or short-term (acute). Treatment for insomnia depends on the cause. Treatment may focus on treating an underlying condition that is causing the insomnia. Keep a sleep diary to help you and your health care provider figure out what could be causing your insomnia. This information is not intended to replace advice given to you by your health care provider. Make sure you discuss any questions you have with your health care provider. Document Revised: 10/04/2021 Document Reviewed: 10/04/2021 Elsevier Patient Education  [place].

## 2022-05-18 DIAGNOSIS — Z0289 Encounter for other administrative examinations: Secondary | ICD-10-CM

## 2022-05-18 NOTE — Progress Notes (Unsigned)
Assessment and Plan:  There are no diagnoses linked to this encounter.    Further disposition pending results of labs. Discussed med's effects and SE's.   Over 30 minutes of exam, counseling, chart review, and critical decision making was performed.   Future Appointments  Date Time Provider [place]  05/19/2022  3:30 PM Alycia Rossetti, NP [facility] None  09/14/2022  2:00 PM Unk Pinto, MD [facility] None  12/20/2022  2:15 PM Frann Rider, NP [facility] None    ------------------------------------------------------------------------------------------------------------------   HPI There were no vitals taken for this visit. 61 y.o.male presents for  Past Medical History:  Diagnosis Date   Allergy    Chronic cough    Chronic sinusitis    Diverticulitis 2018   Mild   Diverticulosis of colon (without mention of hemorrhage)    Early satiety    Esophageal motility disorder 12/2018   Family history of adverse reaction to anesthesia    mom has PONV   GERD (gastroesophageal reflux disease)    History of kidney stones    2017   History of left inguinal hernia    Hypertension    Insomnia    Lyme disease    QUESTIONABLE   Pulmonary nodules 10/2018   Scattered tiny subpleural pulmonary nodules at the peripheral right lung base, largest 3 mm   Right inguinal hernia 2017   Small fat containing right inguinal hernia   Sleep apnea    no CPAP   Sleep deficient    Testosterone deficiency    Vitamin D deficiency    Wears glasses      Allergies  Allergen Reactions   Allegra [Fexofenadine] Other (See Comments)    Worsened insomnia   Benzoyl Peroxide     blisters   Decadron [Dexamethasone]     Reflux & Hiccups, GI upset   Doxycycline Nausea Only   Levaquin [Levofloxacin] Nausea And Vomiting    Patient states that after he took levaquin this past time he had nausea with vomiting    Current Outpatient Medications on File Prior to Visit  Medication Sig    albuterol (VENTOLIN HFA) 108 (90 Base) MCG/ACT inhaler 2 puffs   Azelastine HCl 137 MCG/SPRAY SOLN SMARTSIG:2 Spray(s) Both Nares Every Evening   bisoprolol-hydrochlorothiazide (ZIAC) 2.5-6.25 MG tablet Take 1 tablet by mouth daily.   calcium carbonate (TUMS EX) 750 MG chewable tablet Chew 1 tablet by mouth daily.   cholecalciferol (VITAMIN D) 25 MCG (1000 UNIT) tablet Take 1,000 Units by mouth daily.   clindamycin (CLEOCIN T) 1 % lotion Apply 1 application topically every evening.   famotidine (PEPCID) 20 MG tablet Take 20 mg by mouth daily.   fluticasone (FLONASE) 50 MCG/ACT nasal spray Place 2 sprays into both nostrils daily.    fluticasone (FLOVENT HFA) 110 MCG/ACT inhaler Inhale into the lungs 2 (two) times daily.   hydrocortisone cream 1 % Apply 1 application topically daily as needed for itching.   ipratropium (ATROVENT) 0.06 % nasal spray SMARTSIG:1 Spray(s) Both Nares Twice Daily PRN   ketotifen (ZADITOR) 0.025 % ophthalmic solution Uses prn   loratadine (CLARITIN) 10 MG tablet Take 10 mg by mouth daily.   montelukast (SINGULAIR) 10 MG tablet TAKE 1 TABLET BY MOUTH DAILY FOR ALLERGIES   Olopatadine HCl 0.2 % SOLN Place 1 drop into both eyes daily as needed.   OVER THE COUNTER MEDICATION Take 2 capsules by mouth at bedtime. Serenagen otc supplement   RABEprazole (ACIPHEX) 20 MG tablet Take 1 tablet (20 mg  total) by mouth 2 (two) times daily.   tamsulosin (FLOMAX) 0.4 MG CAPS capsule TAKE 1 CAPSULE AT BEDTIME FOR PROSTATE (Patient taking differently: Take 0.4 mg by mouth at bedtime.)   triamcinolone cream (KENALOG) 0.1 % Apply 1 application topically 2 (two) times daily as needed for itching.   Zinc 50 MG TABS Take 1 tablet Daily   No current facility-administered medications on file prior to visit.    ROS: all negative except above.   Physical Exam:  There were no vitals taken for this visit.  General Appearance: Well nourished, in no apparent distress. Eyes: PERRLA, EOMs,  conjunctiva no swelling or erythema Sinuses: No Frontal/maxillary tenderness ENT/Mouth: Ext aud canals clear, TMs without erythema, bulging. No erythema, swelling, or exudate on post pharynx.  Tonsils not swollen or erythematous. Hearing normal.  Neck: Supple, thyroid normal.  Respiratory: Respiratory effort normal, BS equal bilaterally without rales, rhonchi, wheezing or stridor.  Cardio: RRR with no MRGs. Brisk peripheral pulses without edema.  Abdomen: Soft, + BS.  Non tender, no guarding, rebound, hernias, masses. Lymphatics: Non tender without lymphadenopathy.  Musculoskeletal: Full ROM, 5/5 strength, normal gait.  Skin: Warm, dry without rashes, lesions, ecchymosis.  Neuro: Cranial nerves intact. Normal muscle tone, no cerebellar symptoms. Sensation intact.  Psych: Awake and oriented X 3, normal affect, Insight and Judgment appropriate.     Alycia Rossetti, NP 11:52 AM [facility]

## 2022-05-19 ENCOUNTER — Ambulatory Visit (INDEPENDENT_AMBULATORY_CARE_PROVIDER_SITE_OTHER): Payer: Commercial Managed Care - PPO | Admitting: Nurse Practitioner

## 2022-05-19 ENCOUNTER — Encounter: Payer: Self-pay | Admitting: Nurse Practitioner

## 2022-05-19 VITALS — BP 110/68 | HR 63 | Temp 97.5°F | Ht 69.0 in | Wt 173.2 lb

## 2022-05-19 DIAGNOSIS — D508 Other iron deficiency anemias: Secondary | ICD-10-CM

## 2022-05-19 DIAGNOSIS — G8929 Other chronic pain: Secondary | ICD-10-CM | POA: Diagnosis not present

## 2022-05-19 DIAGNOSIS — M542 Cervicalgia: Secondary | ICD-10-CM | POA: Diagnosis not present

## 2022-05-19 DIAGNOSIS — E291 Testicular hypofunction: Secondary | ICD-10-CM | POA: Diagnosis not present

## 2022-05-19 MED ORDER — TESTOSTERONE 20.25 MG/ACT (1.62%) TD GEL: TRANSDERMAL | 4 refills | Status: DC

## 2022-05-20 LAB — CBC WITH DIFFERENTIAL/PLATELET
Absolute Monocytes: 640 cells/uL (ref 200–950)
Basophils Absolute: 10 cells/uL (ref 0–200)
Basophils Relative: 0.2 %
Eosinophils Absolute: 52 cells/uL (ref 15–500)
Eosinophils Relative: 1 %
HCT: 39.5 % (ref 38.5–50.0)
Hemoglobin: 13.7 g/dL (ref 13.2–17.1)
Lymphs Abs: 1331 cells/uL (ref 850–3900)
MCH: 31.2 pg (ref 27.0–33.0)
MCHC: 34.7 g/dL (ref 32.0–36.0)
MCV: 90 fL (ref 80.0–100.0)
MPV: 9.7 fL (ref 7.5–12.5)
Monocytes Relative: 12.3 %
Neutro Abs: 3167 cells/uL (ref 1500–7800)
Neutrophils Relative %: 60.9 %
Platelets: 174 10*3/uL (ref 140–400)
RBC: 4.39 10*6/uL (ref 4.20–5.80)
RDW: 12.5 % (ref 11.0–15.0)
Total Lymphocyte: 25.6 %
WBC: 5.2 10*3/uL (ref 3.8–10.8)

## 2022-05-20 LAB — TESTOSTERONE: Testosterone: 201 ng/dL — ABNORMAL LOW (ref 250–827)

## 2022-05-27 ENCOUNTER — Telehealth: Payer: Self-pay | Admitting: Nurse Practitioner

## 2022-05-27 ENCOUNTER — Other Ambulatory Visit: Payer: Self-pay | Admitting: Nurse Practitioner

## 2022-05-27 DIAGNOSIS — E291 Testicular hypofunction: Secondary | ICD-10-CM

## 2022-05-27 MED ORDER — TESTOSTERONE 20.25 MG/ACT (1.62%) TD GEL
TRANSDERMAL | 4 refills | Status: DC
Start: 2022-05-27 — End: 2023-02-27

## 2022-05-27 NOTE — Progress Notes (Signed)
PDMP is reviewed and appropriate  

## 2022-05-27 NOTE — Telephone Encounter (Signed)
Testosterone (ANDROGEL PUMP) 20.25 MG/ACT (1.62%) GEL says "print" and doesn't look like it was sent into his pharmacy. If it can be sent into [facility] in [place] on file

## 2022-05-29 IMAGING — CR DG ABDOMEN 1V
1 series · 2 of 2 positions shown · non-contrast
Comparison: 01/14/2019

CLINICAL DATA: No bowel movement for several days, initial
encounter

EXAM:
ABDOMEN - 1 VIEW

[Series 1: dg abd 1 view · 0.14mm/px · 2 of 2 slices shown]
[im 1/2]
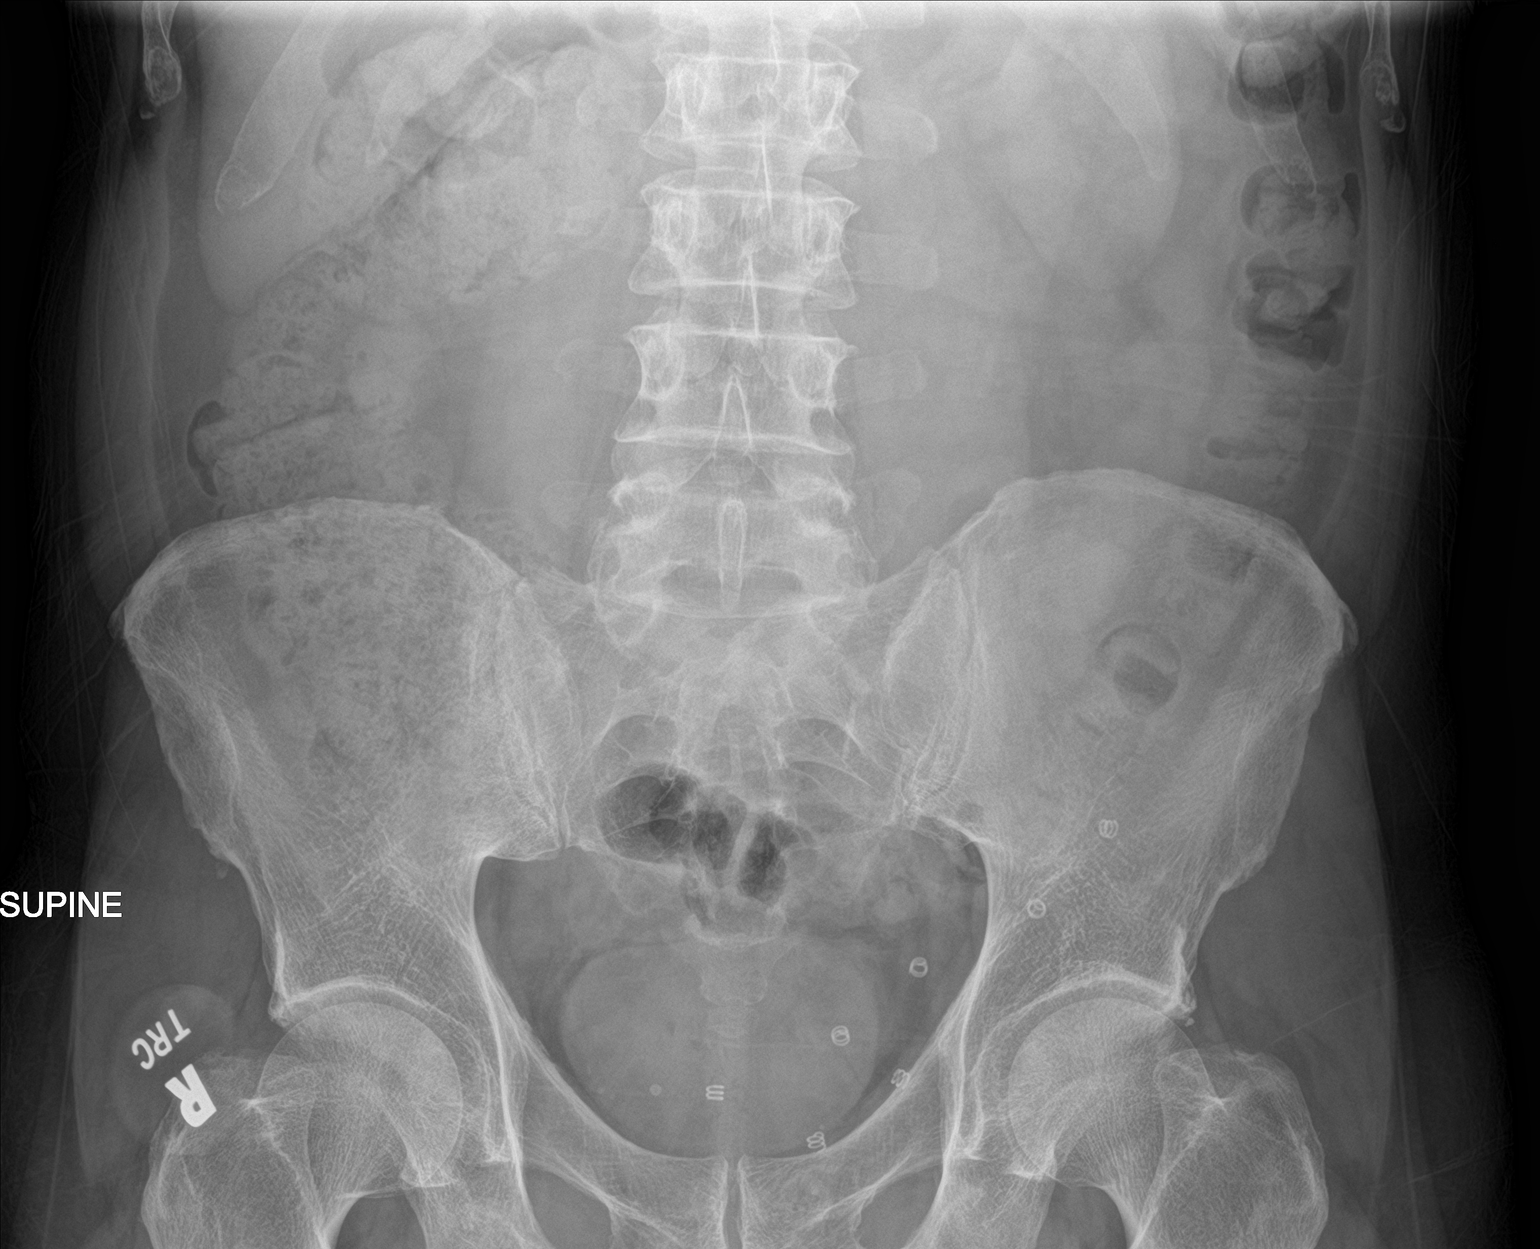
[im 2/2]
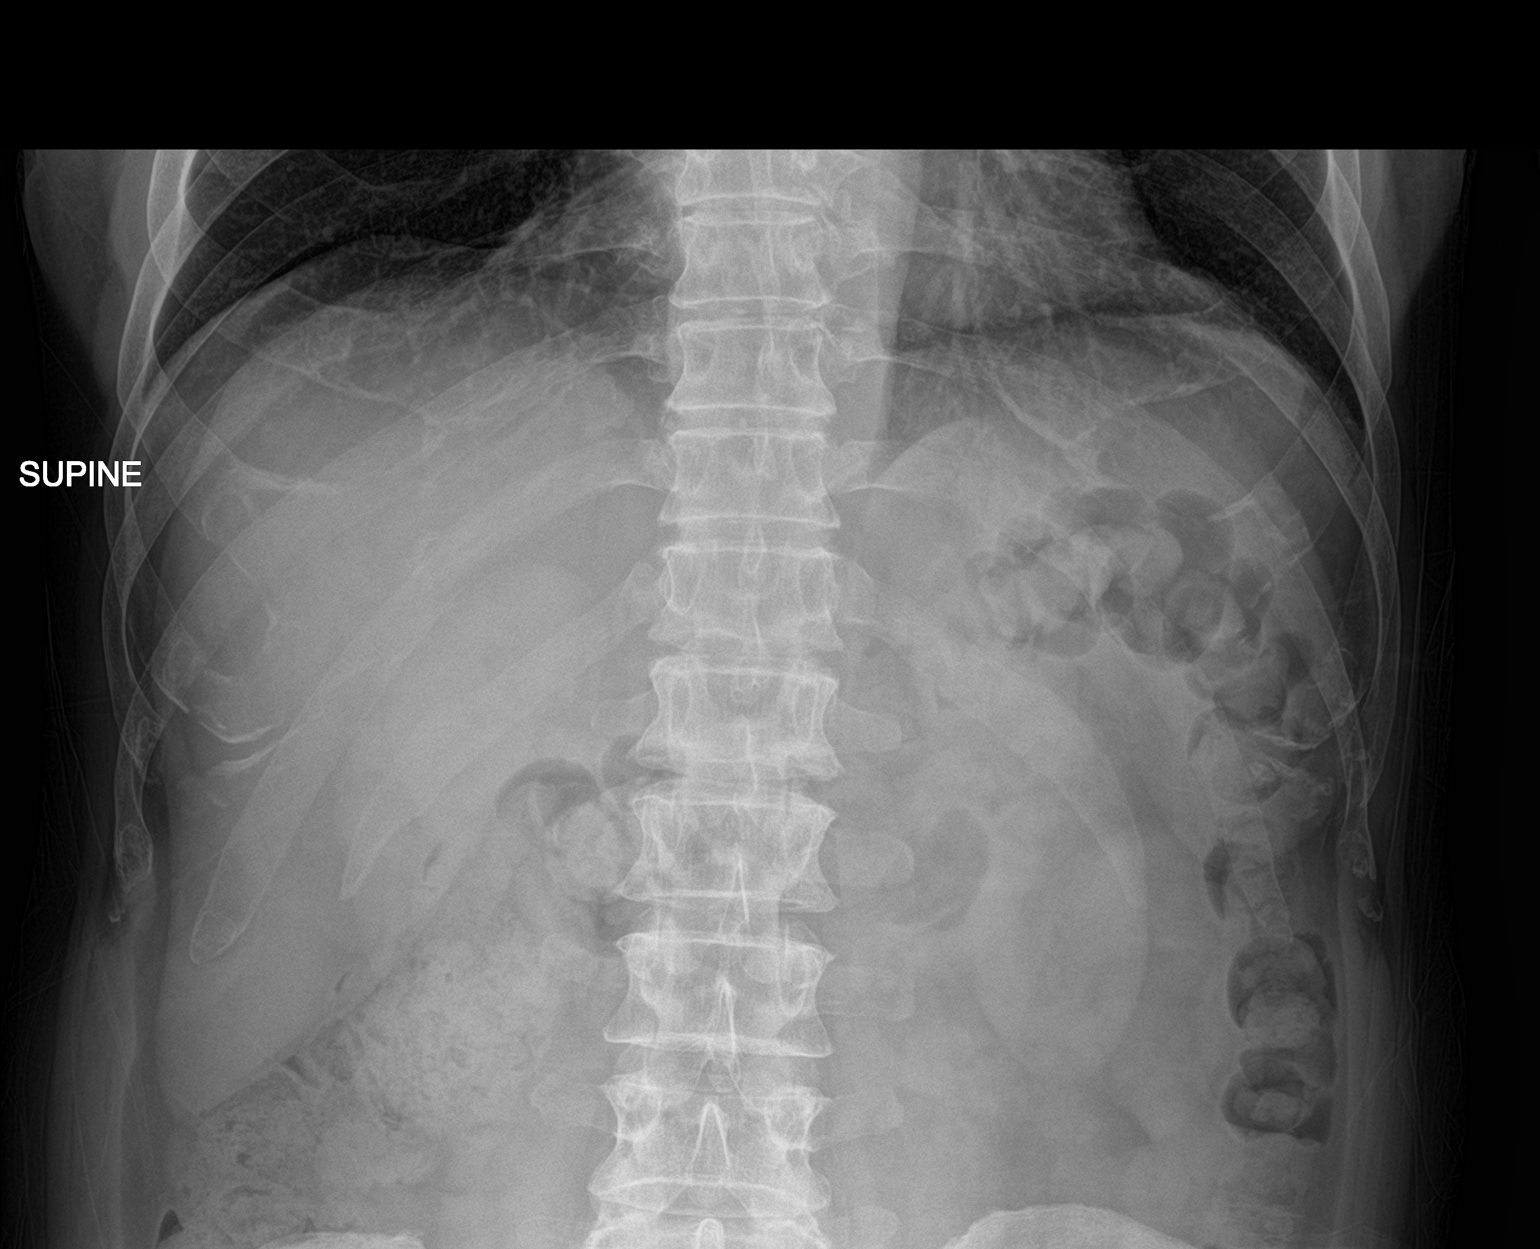

[2 of 2 positions shown; findings below may reference images not displayed]

FINDINGS: Scattered large and small bowel gas is noted. Mild retained fecal
material is noted particularly in the right colon. No obstructive
changes are seen. No free air is noted. No acute bony abnormality is
seen. Prior hernia repair on the left is again noted.
IMPRESSION: Mild retained fecal material.

## 2022-05-30 ENCOUNTER — Encounter: Payer: Self-pay | Admitting: Internal Medicine

## 2022-05-30 ENCOUNTER — Ambulatory Visit (INDEPENDENT_AMBULATORY_CARE_PROVIDER_SITE_OTHER): Payer: Commercial Managed Care - PPO | Admitting: Internal Medicine

## 2022-05-30 VITALS — BP 122/80 | HR 56 | Temp 97.9°F | Resp 17 | Ht 69.0 in | Wt 172.2 lb

## 2022-05-30 DIAGNOSIS — R1314 Dysphagia, pharyngoesophageal phase: Secondary | ICD-10-CM

## 2022-05-30 NOTE — Progress Notes (Signed)
Future Appointments  Date Time Provider Department  05/30/2022  2:30 PM Unk Pinto, MD [facility]  09/14/2022  2:00 PM Unk Pinto, MD [facility]  12/20/2022  2:15 PM Frann Rider, NP [facility]    History of Present Illness:      Patient is a very nice 61 yo MWM has hx/o  HTN, HLD, Prediabetes,  Testosterone Deficiency  &Vitamin D Deficiency.  Patient has moderately severe OSA on auto CPAP and is followed by Dr Brett Fairy. Because of  intolerance to CPAP,  he has tried being fitted with an oral appliance by Dr Augustina Mood.  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 Bravo test per Dr Ardis Hughs did not reveal significant acid reflux and did not feel his cough was related to GERD. He also was seen by ENT, Dr Carol Ada w/o any ENT pathology identified. Allergy Evaluation by Dr Orvil Feil was likewise unrevealing. Patient reports PFT's per Dr Melvyn Novas did not reveal Asthma and empiric treatment with MDI's did not improve his cough.  Patient has been out of work per Dr Melvyn Novas & on Disability for almost a year since last Oct 2019 for Allergic Rhinitis & 'Upper Airway Cough Syndrome'...]]   Medications  Current Outpatient Medications (Endocrine & Metabolic):    Testosterone (ANDROGEL PUMP) 20.25 MG/ACT (1.62%) GEL, 2 pumps each arm daily, (4 pumps total)  Current Outpatient Medications (Cardiovascular):    bisoprolol-hydrochlorothiazide (ZIAC) 2.5-6.25 MG tablet, Take 1 tablet by mouth daily.  Current Outpatient Medications (Respiratory):    albuterol (VENTOLIN HFA) 108 (90 Base) MCG/ACT inhaler, 2 puffs   Azelastine HCl 137 MCG/SPRAY SOLN, SMARTSIG:2 Spray(s) Both Nares Every Evening   fluticasone  (FLONASE) 50 MCG/ACT nasal spray, Place 2 sprays into both nostrils daily.    fluticasone (FLOVENT HFA) 110 MCG/ACT inhaler, Inhale into the lungs 2 (two) times daily.   ipratropium (ATROVENT) 0.06 % nasal spray, SMARTSIG:1 Spray(s) Both Nares Twice Daily PRN   loratadine (CLARITIN) 10 MG tablet, Take 10 mg by mouth daily.   montelukast (SINGULAIR) 10 MG tablet, TAKE 1 TABLET BY MOUTH DAILY FOR ALLERGIES   Current Outpatient Medications (Hematological):    Methylcobalamin (B12) 5000 MCG SUBL, Place under the tongue.  Current Outpatient Medications (Other):    calcium carbonate (TUMS EX) 750 MG chewable tablet, Chew 1 tablet by mouth daily.   cholecalciferol (VITAMIN D) 25 MCG (1000 UNIT) tablet, Take 1,000 Units by mouth daily.   clindamycin (CLEOCIN T) 1 % lotion, Apply 1 application topically every evening.   famotidine (PEPCID) 20 MG tablet, Take 20 mg by mouth daily.   hydrocortisone cream 1 %, Apply 1 application topically daily as needed for itching.   ketotifen (ZADITOR) 0.025 % ophthalmic solution, Uses prn   Olopatadine HCl 0.2 % SOLN, Place 1 drop into both eyes daily as needed.   OVER THE COUNTER MEDICATION, Take 2 capsules by mouth at bedtime. Serenagen otc supplement   RABEprazole (ACIPHEX) 20 MG tablet, Take 1 tablet (20 mg total) by mouth 2 (two) times daily.   tamsulosin (FLOMAX) 0.4 MG CAPS capsule, TAKE 1 CAPSULE AT BEDTIME FOR PROSTATE (Patient taking differently: Take 0.4 mg by mouth  at bedtime.)   triamcinolone cream (KENALOG) 0.1 %, Apply 1 application topically 2 (two) times daily as needed for itching.   Zinc 50 MG TABS, Take 1 tablet Daily  Problem list He has Cough; GERD (gastroesophageal reflux disease); Chronic rhinitis; Testosterone Deficiency; Hyperlipidemia, mixed; Medication management; Labile hypertension; Abnormal glucose; Vitamin D deficiency; Prostatism; FHx: heart disease; Essential hypertension; Chronic insomnia; OSA (obstructive sleep apnea);  Intolerance of continuous positive airway pressure (CPAP) ventilation; Status post nasal septoplasty; Encounter for [employer]) examination; and Psychophysiological insomnia on their problem list.   Observations/Objective:  BP 122/80   Pulse (!) 56   Temp 97.9 F (36.6 C)   Resp 17   Ht '5\' 9"'$  (1.753 m)   Wt 172 lb 3.2 oz (78.1 kg)   SpO2 98%   BMI 25.43 kg/m   HEENT - WNL. O/P appears clear w/o any evidence of lesion. Neck - supple w/o palpable masses or tenderness & Thyroid is not palpable. No Cx LN is evident.  Chest - Clear equal BS. Cor - Nl HS. RRR w/o sig MGR. PP 1(+). No edema. MS- FROM w/o deformities.  Gait Nl. Neuro -  Nl w/o focal abnormalities.  Assessment and Plan:  1. Pharyngoesophageal dysphagia  - Ambulatory referral to ENT  Dr Orie Rout  for  Laryngoscopy to r/o lesion.   Follow Up Instructions:      I discussed the assessment and treatment plan with the patient. The patient was provided an opportunity to ask questions and all were answered. The patient agreed with the plan and demonstrated an understanding of the instructions.       The patient was advised to call back or seek an in-person evaluation if the symptoms worsen or if the condition fails to improve as anticipated.   Kirtland Bouchard, MD

## 2022-05-30 NOTE — Patient Instructions (Signed)
Dysphagia  Dysphagia is trouble swallowing. This condition occurs when solids and liquids stick in a person's throat on the way down to the stomach, or when food takes longer to get to the stomach than usual. You may have problems swallowing food, liquids, or both. You may also have pain while trying to swallow. It may take you more time and effort to swallow something. What are the causes? This condition may be caused by: Muscle problems. These may make it difficult for you to move food and liquids through the esophagus, which is the tube that connects your mouth to your stomach. Blockages. You may have ulcers, scar tissue, or inflammation that blocks the normal passage of food and liquids. Causes of these problems include: Acid reflux from your stomach into your esophagus (gastroesophageal reflux). Infections. Radiation treatment for cancer. Medicines taken without enough fluids to wash them down into your stomach. Stroke. This can affect the nerves and make it difficult to swallow. Nerve problems. These prevent signals from being sent to the muscles of your esophagus to squeeze (contract) and move what you swallow down to your stomach. Globus pharyngeus. This is a common problem that involves a feeling like something is stuck in your throat or a sense of trouble with swallowing, even though nothing is wrong with the swallowing passages. Certain conditions, such as cerebral palsy or Parkinson's disease. What are the signs or symptoms? Common symptoms of this condition include: A feeling that solids or liquids are stuck in your throat on the way down to the stomach. Pain while swallowing. Coughing or gagging while trying to swallow. Other symptoms include: Food moving back from your stomach to your mouth (regurgitation). Noises coming from your throat. Chest discomfort when swallowing. A feeling of fullness when swallowing. Drooling, especially when the throat is blocked. Heartburn. How  is this diagnosed? This condition may be diagnosed by: Barium swallow X-ray. In this test, you will swallow a white liquid that sticks to the inside of your esophagus. X-ray images are then taken. Endoscopy. In this test, a flexible telescope is inserted down your throat to look at your esophagus and your stomach. CT scans or an MRI. How is this treated? Treatment for dysphagia depends on the cause of this condition: If the dysphagia is caused by acid reflux or infection, medicines may be used. These may include antibiotics or heartburn medicines. If the dysphagia is caused by problems with the muscles, swallowing therapy may be used to help you strengthen your swallowing muscles. You may have to do specific exercises to strengthen the muscles or stretch them. If the dysphagia is caused by a blockage or mass, procedures to remove the blockage may be done. You may need surgery and a feeding tube. You may need to make diet changes. Ask your health care provider for specific instructions. Follow these instructions at home: Medicines Take over-the-counter and prescription medicines only as told by your health care provider. If you were prescribed an antibiotic medicine, take it as told by your health care provider. Do not stop taking the antibiotic even if you start to feel better. Eating and drinking  Make any diet changes as told by your health care provider. Work with a diet and nutrition specialist (dietitian) to create an eating plan that will help you get the nutrients you need in order to stay healthy. Eat soft foods that are easier to swallow. Cut your food into small pieces and eat slowly. Take small bites. Eat and drink only when you   are sitting upright. Do not drink alcohol or caffeine. If you need help quitting, ask your health care provider. General instructions Check your weight every day to make sure you are not losing weight. Do not use any products that contain nicotine or  tobacco. These products include cigarettes, chewing tobacco, and vaping devices, such as e-cigarettes. If you need help quitting, ask your health care provider. Keep all follow-up visits. This is important. Contact a health care provider if: You lose weight because you cannot swallow. You cough when you drink liquids. You cough up partially digested food. Get help right away if: You cannot swallow your saliva. You have shortness of breath, a fever, or both. Your voice is hoarse and you have trouble swallowing. These symptoms may represent a serious problem that is an emergency. Do not wait to see if the symptoms will go away. Get medical help right away. Call your local emergency services (911 in the [place] Do not drive yourself to the hospital. Summary Dysphagia is trouble swallowing. This condition occurs when solids and liquids stick in a person's throat on the way down to the stomach. You may cough or gag while trying to swallow. Dysphagia has many possible causes. Treatment for dysphagia depends on the cause of the condition. Keep all follow-up visits. This is important. This information is not intended to replace advice given to you by your health care provider. Make sure you discuss any questions you have with your health care provider. Document Revised: 06/13/2020 Document Reviewed: 06/13/2020 Elsevier Patient Education  2023 [employer]  

## 2022-06-01 ENCOUNTER — Telehealth: Payer: Self-pay

## 2022-06-01 NOTE — Telephone Encounter (Signed)
Prior Auth for testosterone gel approved through 06/02/23

## 2022-06-07 ENCOUNTER — Other Ambulatory Visit: Payer: Self-pay | Admitting: Nurse Practitioner

## 2022-06-07 DIAGNOSIS — I10 Essential (primary) hypertension: Secondary | ICD-10-CM

## 2022-06-09 ENCOUNTER — Telehealth: Payer: Self-pay | Admitting: Neurology

## 2022-06-09 NOTE — Telephone Encounter (Signed)
Form completed for the patient and given to medical records to submit for the patient

## 2022-06-30 ENCOUNTER — Other Ambulatory Visit: Payer: Self-pay | Admitting: Otolaryngology

## 2022-06-30 DIAGNOSIS — R0989 Other specified symptoms and signs involving the circulatory and respiratory systems: Secondary | ICD-10-CM

## 2022-07-14 ENCOUNTER — Ambulatory Visit
Admission: RE | Admit: 2022-07-14 | Discharge: 2022-07-14 | Disposition: A | Discharge status: Home / Self Care | Payer: Commercial Managed Care - PPO | Source: Ambulatory Visit | Attending: Otolaryngology | Admitting: Otolaryngology

## 2022-07-14 DIAGNOSIS — R0989 Other specified symptoms and signs involving the circulatory and respiratory systems: Secondary | ICD-10-CM

## 2022-07-14 MED ORDER — IOPAMIDOL (ISOVUE-300) INJECTION 61%
75.0000 mL | Freq: Once | INTRAVENOUS | Status: AC | PRN
  Administered 2022-07-14: 75 mL via INTRAVENOUS

## 2022-07-19 ENCOUNTER — Encounter: Payer: Self-pay | Admitting: Podiatry

## 2022-07-19 ENCOUNTER — Ambulatory Visit: Payer: Commercial Managed Care - PPO | Admitting: Podiatry

## 2022-07-19 DIAGNOSIS — L603 Nail dystrophy: Secondary | ICD-10-CM

## 2022-07-19 NOTE — Progress Notes (Signed)
Subjective:  Patient ID: Jay Brooks, male    DOB: Dec 02, 1960,  MRN: [id number]  Chief Complaint  Patient presents with   Nail Problem    Rt hallux nail causing some discomfort     61 y.o. male presents with the above complaint.  Patient presents with right hallux nail dystrophy some discoloration discomfort.  Patient had an ingrown done by Dr. Babs Bertin.  He wants to have it evaluated seems to hurt him.  He may have had a contusion in the past unable to recall history.  He would like for me to remove the nail.  He denies any other acute complaints.  He is not a diabetic.  Causes him some discomfort while ambulating especially in closed toe shoe   Review of Systems: Negative except as noted in the HPI. Denies N/V/F/Ch.  Past Medical History:  Diagnosis Date   Allergy    Chronic cough    Chronic sinusitis    Diverticulitis 2018   Mild   Diverticulosis of colon (without mention of hemorrhage)    Early satiety    Esophageal motility disorder 12/2018   Family history of adverse reaction to anesthesia    mom has PONV   GERD (gastroesophageal reflux disease)    History of kidney stones    2017   History of left inguinal hernia    Hypertension    Insomnia    Lyme disease    QUESTIONABLE   Pulmonary nodules 10/2018   Scattered tiny subpleural pulmonary nodules at the peripheral right lung base, largest 3 mm   Right inguinal hernia 2017   Small fat containing right inguinal hernia   Sleep apnea    no CPAP   Sleep deficient    Testosterone deficiency    Vitamin D deficiency    Wears glasses     Current Outpatient Medications:    albuterol (VENTOLIN HFA) 108 (90 Base) MCG/ACT inhaler, 2 puffs, Disp: , Rfl:    Azelastine HCl 137 MCG/SPRAY SOLN, SMARTSIG:2 Spray(s) Both Nares Every Evening, Disp: , Rfl:    bisoprolol-hydrochlorothiazide (ZIAC) 2.5-6.25 MG tablet, TAKE 1 TABLET BY MOUTH EVERY DAY, Disp: 30 tablet, Rfl: 2   calcium carbonate (TUMS EX) 750 MG chewable tablet, Chew  1 tablet by mouth daily., Disp: , Rfl:    cholecalciferol (VITAMIN D) 25 MCG (1000 UNIT) tablet, Take 1,000 Units by mouth daily., Disp: , Rfl:    clindamycin (CLEOCIN T) 1 % lotion, Apply 1 application topically every evening., Disp: , Rfl:    famotidine (PEPCID) 20 MG tablet, Take 20 mg by mouth daily., Disp: , Rfl:    fluticasone (FLONASE) 50 MCG/ACT nasal spray, Place 2 sprays into both nostrils daily. , Disp: , Rfl:    fluticasone (FLOVENT HFA) 110 MCG/ACT inhaler, Inhale into the lungs 2 (two) times daily., Disp: , Rfl:    hydrocortisone cream 1 %, Apply 1 application topically daily as needed for itching., Disp: , Rfl:    ipratropium (ATROVENT) 0.06 % nasal spray, SMARTSIG:1 Spray(s) Both Nares Twice Daily PRN, Disp: , Rfl:    ketotifen (ZADITOR) 0.025 % ophthalmic solution, Uses prn, Disp: 5 mL, Rfl: 0   loratadine (CLARITIN) 10 MG tablet, Take 10 mg by mouth daily., Disp: , Rfl:    Methylcobalamin (B12) 5000 MCG SUBL, Place under the tongue., Disp: , Rfl:    montelukast (SINGULAIR) 10 MG tablet, TAKE 1 TABLET BY MOUTH DAILY FOR ALLERGIES, Disp: 30 tablet, Rfl: 3   Olopatadine HCl 0.2 % SOLN, Place  1 drop into both eyes daily as needed., Disp: , Rfl:    OVER THE COUNTER MEDICATION, Take 2 capsules by mouth at bedtime. Serenagen otc supplement, Disp: , Rfl:    RABEprazole (ACIPHEX) 20 MG tablet, Take 1 tablet (20 mg total) by mouth 2 (two) times daily., Disp: 180 tablet, Rfl: 3   tamsulosin (FLOMAX) 0.4 MG CAPS capsule, TAKE 1 CAPSULE AT BEDTIME FOR PROSTATE (Patient taking differently: Take 0.4 mg by mouth at bedtime.), Disp: 90 capsule, Rfl: 2   Testosterone (ANDROGEL PUMP) 20.25 MG/ACT (1.62%) GEL, 2 pumps each arm daily, (4 pumps total), Disp: 75 g, Rfl: 4   triamcinolone cream (KENALOG) 0.1 %, Apply 1 application topically 2 (two) times daily as needed for itching., Disp: , Rfl:    Zinc 50 MG TABS, Take 1 tablet Daily, Disp: , Rfl: 0  Social History   Tobacco Use  Smoking Status  Never  Smokeless Tobacco Never    Allergies  Allergen Reactions   Allegra [Fexofenadine] Other (See Comments)    Worsened insomnia   Benzoyl Peroxide     blisters   Decadron [Dexamethasone]     Reflux & Hiccups, GI upset   Doxycycline Nausea Only   Levaquin [Levofloxacin] Nausea And Vomiting    Patient states that after he took levaquin this past time he had nausea with vomiting   Objective:  There were no vitals filed for this visit. There is no height or weight on file to calculate BMI. Constitutional Well developed. Well nourished.  Vascular Dorsalis pedis pulses palpable bilaterally. Posterior tibial pulses palpable bilaterally. Capillary refill normal to all digits.  No cyanosis or clubbing noted. Pedal hair growth normal.  Neurologic Normal speech. Oriented to person, place, and time. Epicritic sensation to light touch grossly present bilaterally.  Dermatologic Pain on palpation of the entire/total nail on 1st digit of the right No other open wounds. No skin lesions.  Orthopedic: Normal joint ROM without pain or crepitus bilaterally. No visible deformities. No bony tenderness.   Radiographs: None Assessment:   1. Nail dystrophy    Plan:  Patient was evaluated and treated and all questions answered.  Nail contusion/dystrophy hallux, right -Patient elects to proceed with minor surgery to remove entire toenail today. Consent reviewed and signed by patient. -Entire/total nail excised. See procedure note. -Educated on post-procedure care including soaking. Written instructions provided and reviewed. -Patient to follow up in 2 weeks for nail check.  Procedure: Excision of entire/total nail  Location: Right 1st toe digit Anesthesia: Lidocaine 1% plain; 1.5 mL and Marcaine 0.5% plain; 1.5 mL, digital block. Skin Prep: Betadine. Dressing: Silvadene; telfa; dry, sterile, compression dressing. Technique: Following skin prep, the toe was exsanguinated and a tourniquet  was secured at the base of the toe. The affected nail border was freed and excised. The tourniquet was then removed and sterile dressing applied. Disposition: Patient tolerated procedure well. Patient to return in 2 weeks for follow-up.   No follow-ups on file.

## 2022-08-22 ENCOUNTER — Encounter: Payer: Self-pay | Admitting: Neurology

## 2022-08-22 ENCOUNTER — Telehealth: Payer: Self-pay | Admitting: Neurology

## 2022-08-22 NOTE — Telephone Encounter (Signed)
Patient left a voicemail on my phone asking what he needs to do to do a level two sleep study. He stated he has been working with Augustina Mood and he did a HST with her and has a AHI of 3 with sleep appliance.

## 2022-08-23 NOTE — Telephone Encounter (Signed)
Called patient to clarify what he was needing in regards to a sleep study. Patient stated that the FAA os not accepting the results of the [facility] home sleep test. He is needing the home sleep test that uses a chest belt. Unfortunately our facility does not use that type of HST. We have a level 4 HST called WatchPat. Pt was instructed to reach out to a home health care company to see if they could provide him with the home sleep test that he is requesting.

## 2022-08-31 ENCOUNTER — Other Ambulatory Visit: Payer: Self-pay | Admitting: Nurse Practitioner

## 2022-08-31 DIAGNOSIS — I10 Essential (primary) hypertension: Secondary | ICD-10-CM

## 2022-09-10 ENCOUNTER — Other Ambulatory Visit: Payer: Self-pay | Admitting: Nurse Practitioner

## 2022-09-10 DIAGNOSIS — J302 Other seasonal allergic rhinitis: Secondary | ICD-10-CM

## 2022-09-14 ENCOUNTER — Encounter: Payer: Self-pay | Admitting: Internal Medicine

## 2022-09-14 ENCOUNTER — Ambulatory Visit (INDEPENDENT_AMBULATORY_CARE_PROVIDER_SITE_OTHER): Payer: Commercial Managed Care - PPO | Admitting: Internal Medicine

## 2022-09-14 VITALS — BP 138/82 | HR 58 | Temp 97.9°F | Resp 16 | Ht 69.0 in | Wt 173.8 lb

## 2022-09-14 DIAGNOSIS — I700 Atherosclerosis of aorta: Secondary | ICD-10-CM | POA: Diagnosis not present

## 2022-09-14 DIAGNOSIS — R5383 Other fatigue: Secondary | ICD-10-CM

## 2022-09-14 DIAGNOSIS — Z23 Encounter for immunization: Secondary | ICD-10-CM

## 2022-09-14 DIAGNOSIS — Z1211 Encounter for screening for malignant neoplasm of colon: Secondary | ICD-10-CM

## 2022-09-14 DIAGNOSIS — Z111 Encounter for screening for respiratory tuberculosis: Secondary | ICD-10-CM | POA: Diagnosis not present

## 2022-09-14 DIAGNOSIS — K219 Gastro-esophageal reflux disease without esophagitis: Secondary | ICD-10-CM

## 2022-09-14 DIAGNOSIS — R7309 Other abnormal glucose: Secondary | ICD-10-CM

## 2022-09-14 DIAGNOSIS — E782 Mixed hyperlipidemia: Secondary | ICD-10-CM

## 2022-09-14 DIAGNOSIS — I10 Essential (primary) hypertension: Secondary | ICD-10-CM

## 2022-09-14 DIAGNOSIS — Z8249 Family history of ischemic heart disease and other diseases of the circulatory system: Secondary | ICD-10-CM

## 2022-09-14 DIAGNOSIS — E349 Endocrine disorder, unspecified: Secondary | ICD-10-CM

## 2022-09-14 DIAGNOSIS — Z0000 Encounter for general adult medical examination without abnormal findings: Secondary | ICD-10-CM

## 2022-09-14 DIAGNOSIS — Z125 Encounter for screening for malignant neoplasm of prostate: Secondary | ICD-10-CM

## 2022-09-14 DIAGNOSIS — Z79899 Other long term (current) drug therapy: Secondary | ICD-10-CM

## 2022-09-14 DIAGNOSIS — Z0001 Encounter for general adult medical examination with abnormal findings: Secondary | ICD-10-CM

## 2022-09-14 DIAGNOSIS — E559 Vitamin D deficiency, unspecified: Secondary | ICD-10-CM

## 2022-09-14 DIAGNOSIS — Z136 Encounter for screening for cardiovascular disorders: Secondary | ICD-10-CM | POA: Diagnosis not present

## 2022-09-14 DIAGNOSIS — N138 Other obstructive and reflux uropathy: Secondary | ICD-10-CM

## 2022-09-14 NOTE — Progress Notes (Signed)
Future Appointments  Date Time Provider Department  09/22/2022  2:15 PM Felipa Furnace, DPM [facility]  10/18/2022 10:30 AM Mansouraty, Telford Nab., MD [facility]  12/19/2022  2:30 PM Darrol Jump, NP [facility]  12/20/2022  2:15 PM Frann Rider, NP [facility]  03/21/2023  2:30 PM Unk Pinto, MD [facility]  09/20/2023  2:00 PM Unk Pinto, MD [facility]    Annual  Screening/Preventative Visit  & Comprehensive Evaluation & Examination        This very nice 61 y.o. MWM  with  HTN, HLD, Prediabetes, Testosterone Deficiency  and Vitamin D Deficiency  presents for a Screening /Preventative Visit & comprehensive evaluation and management of multiple medical co-morbidities.   Patient is followed by Dr Dohmeier with moderately severe OSA on auto CPAP. Due to intolerance with CPAP he was  fitted with an oral appliance .    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 Bravo test per Dr Ardis Hughs did not reveal significant acid reflux and did not feel his cough was related to GERD. He also was seen by ENT, Dr Carol Ada w/o any ENT pathology identified. Allergy Evaluation by Dr Orvil Feil was likewise unrevealing. Patient reports PFT's per Dr Melvyn Novas did not reveal Asthma and empiric treatment with MDI's did not improve his cough.  Patient has been out of work per Dr Melvyn Novas & on Disability for almost a year since last Oct 2019 for Allergic Rhinitis & 'Upper Airway Cough Syndrome'...]]       HTN predates since 2005.  Patient's BP has been controlled at home.  Today's BP is at goal -                 . Patient denies any cardiac symptoms as chest pain, palpitations, shortness of breath, dizziness or ankle swelling.       Patient's hyperlipidemia is controlled with diet and medications. Patient denies myalgias or other medication SE's. Last lipids were not  at goal:  Lab Results  Component Value Date   CHOL 121 03/16/2022   HDL 30 (L) 03/16/2022   LDLCALC 67 03/16/2022   TRIG 166 (H) 03/16/2022   CHOLHDL 4.0 03/16/2022           Patient has hx/o prediabetes  /Insulin Resistance (A1c 5.0% /elevated Insulin "55" /2014)  and patient denies reactive hypoglycemic symptoms, visual blurring, diabetic polys or paresthesias. Last A1c was at goal:   Lab Results  Component Value Date   HGBA1C 5.2 03/16/2022                                                        Patient has Testosterone Deficiency ("150" /2010) and  is on replacement by injection with improved stamina & sense of well being.        Finally, patient has history of Vitamin D Deficiency ("32" /2009) and last vitamin D was at goal:   Lab Results  Component Value Date   VD25OH 93 09/14/2021       Current Outpatient Medications  Medication Instructions   albuterol  HFA   inhaler 2 puffs   Azelastine HCl 137 MCG/SPRAY S 2 Spray(s) Both  Nares Every Evening   bisoprolol-hctz 2.5-6.25 MG tablet 1 tablet  Daily   TUMS EX 750 MG chewable tablet 1 tablet Daily   clindamycin (CLEOCIN T) 1 % lotion 1 application  Every evening   famotidine 20 mg 2 Daily   FLONASE nasal spray 2 sprays, Each Nare, Daily   fluticasone  HFA inhaler Inhalation, 2 times daily   hydrocortisone cream 1 % 1 application Daily PRN   ipratropium (ATROVENT) 0.06 % nasal spray SMARTSIG:1 Spray(s) Both Nares Twice Daily PRN   ketotifen  0.025 % ophthalmic solution Uses prn   loratadine  10 mg, Oral, Daily   B12 5000 MCG SL Sublingual   montelukast 10 MG tablet TAKE 1 TABLETDAILY FOR ALLERGIES   Olopatadine HCl 0.2 % SOLN 1 drop, Both Eyes, Daily PR   Serenagen otc supplement 2 capsules Daily at bedtime,    RABEprazole (ACIPHEX) 20 mg, Oral, 2 times daily   tamsulosin (FLOMAX) 0.4 MG CAPS capsule TAKE 1 CAPSULE AT BEDTIME FOR PROSTATE   Testosterone ANDROGEL PUMP 20.25 MG/ACT (1.62%) GEL 2 pumps each arm daily,  (4 pumps total)   triamcinolone cream 0.1 % 1 application , Topical, 2 times daily PRN   Zinc 50 MG TABS Take 1 tablet Daily       Allergies  Allergen Reactions   Allegra [Fexofenadine] Other (See Comments)    Worsened insomnia   Benzoyl Peroxide     blisters   Decadron [Dexamethasone]     Reflux & Hiccups, GI upset   Doxycycline Nausea Only   Levaquin [Levofloxacin] Nausea And Vomiting    Patient states that after he took levaquin this past time he had nausea with vomiting     Past Medical History:  Diagnosis Date   Allergy    Chronic cough    Chronic sinusitis    Diverticulitis 2018   Mild   Diverticulosis of colon (without mention of hemorrhage)    Early satiety    Esophageal motility disorder 12/2018   Family history of adverse reaction to anesthesia    mom has PONV   GERD (gastroesophageal reflux disease)    History of kidney stones    2017   History of left inguinal hernia    Hypertension    Insomnia    Lyme disease    QUESTIONABLE   Pulmonary nodules 10/2018   Scattered tiny subpleural pulmonary nodules at the peripheral right lung base, largest 3 mm   Right inguinal hernia 2017   Small fat containing right inguinal hernia   Sleep apnea    no CPAP   Sleep deficient    Testosterone deficiency    Vitamin D deficiency    Wears glasses      Health Maintenance  Topic Date Due   Pneumococcal Vaccine 1-19 Years old (1 - PCV) Never done   HIV Screening  Never done   Hepatitis C Screening  Never done   Zoster Vaccines- Shingrix (1 of 2) Never done   COVID-19 Vaccine (4 - Booster for Pfizer series) 08/18/2020   INFLUENZA VACCINE  06/07/2021   TETANUS/TDAP  09/07/2030   COLONOSCOPY  07/29/2031   HPV VACCINES  Aged Out     Immunization History  Administered Date(s) Administered   Influenza Inj Mdck Quad    08/29/2017, 07/30/2018, 08/14/2019, 09/07/2020   Influenza Split 08/07/2012, 09/25/2014   Influenza, Seasonal  09/25/2015   Influenza,inj,quad,    09/15/2016   PFIZER SARS-COV-2 Vacc 04/17/2020, 05/08/2020   PPD Test 07/30/2018, 08/14/2019, 09/07/2020  Td 09/07/2020   Tdap 07/08/2010    Last Colon - 07/28/2021-  Dr Ardis Hughs - Recc 10 year f/u due Sept 2032  Past Surgical History:  Procedure Laterality Date   BRAVO [facility] STUDY N/A 05/02/2019   Procedure: BRAVO [place] STUDY;  Surgeon: Milus Banister, MD;  Location: [facility] END[facility]COPY;  Service: Endoscopy;  Laterality: N/A;   COLONOSCOPY  02/2011   ESOPHAGOGASTRODUODENOSCOPY (EGD) WITH PROPOFOL N/A 05/02/2019   Procedure: ESOPHAGOGASTRODUODENOSCOPY (EGD) WITH PROPOFOL;  Surgeon: Milus Banister, MD;  Location: [facility];  Service: Endoscopy;  Laterality: N/A;   HERNIA REPAIR     NASAL SEPTOPLASTY W/ TURBINOPLASTY Bilateral 04/21/2021   Procedure: NASAL SEPTOPLASTY WITH  BILATERAL TURBINATE REDUCTION;  Surgeon: Leta Baptist, MD;  Location: [facility] OR;  Service: ENT;  Laterality: Bilateral;   TOE SURGERY     INGROWN TOE NAIL, CHILDHOOD   UPPER GASTROINTESTINAL ENDOSCOPY     VASECTOMY       Family History  Problem Relation Age of Onset   Breast cancer Maternal Grandmother    Colon polyps Brother    Colon polyps Maternal Uncle    Cancer Mother 75       pancreatic   Colon cancer Neg Hx    Esophageal cancer Neg Hx    Stomach cancer Neg Hx    Ulcerative colitis Neg Hx      Social History   Tobacco Use   Smoking status: Never   Smokeless tobacco: Never  Vaping Use   Vaping Use: Never used  Substance Use Topics   Alcohol use: Yes    Alcohol/week: 2.0 - 3.0 standard drinks    Types: 2 - 3 Cans of beer per week   Drug use: No      ROS Constitutional: Denies fever, chills, weight loss/gain, headaches, insomnia,  night sweats or change in appetite. Does c/o fatigue. Eyes: Denies redness, blurred vision, diplopia, discharge, itchy or watery eyes.  ENT: Denies discharge, congestion, post nasal drip, epistaxis, sore throat, earache, hearing loss, dental pain, Tinnitus, Vertigo, Sinus  pain or snoring.  Cardio: Denies chest pain, palpitations, irregular heartbeat, syncope, dyspnea, diaphoresis, orthopnea, PND, claudication or edema Respiratory: denies cough, dyspnea, DOE, pleurisy, hoarseness, laryngitis or wheezing.  Gastrointestinal: Denies dysphagia, heartburn, reflux, water brash, pain, cramps, nausea, vomiting, bloating, diarrhea, constipation, hematemesis, melena, hematochezia, jaundice or hemorrhoids Genitourinary: Denies dysuria, frequency, urgency, nocturia, hesitancy, discharge, hematuria or flank pain Musculoskeletal: Denies arthralgia, myalgia, stiffness, Jt. Swelling, pain, limp or strain/sprain. Denies Falls. Skin: Denies puritis, rash, hives, warts, acne, eczema or change in skin lesion Neuro: No weakness, tremor, incoordination, spasms, paresthesia or pain Psychiatric: Denies confusion, memory loss or sensory loss. Denies Depression. Endocrine: Denies change in weight, skin, hair change, nocturia, and paresthesia, diabetic polys, visual blurring or hyper / hypo glycemic episodes.  Heme/Lymph: No excessive bleeding, bruising or enlarged lymph nodes.   Physical Exam  BP 138/82   Pulse (!) 58   Temp 97.9 F (36.6 C)   Resp 16   Ht '5\' 9"'$  (1.753 m)   Wt 173 lb 12.8 oz (78.8 kg)   SpO2 98%   BMI 25.67 kg/m   General Appearance: Well nourished and well groomed and in no apparent distress.  Eyes: PERRLA, EOMs, conjunctiva no swelling or erythema, normal fundi and vessels. Sinuses: No frontal/maxillary tenderness ENT/Mouth: EACs patent / TMs  nl. Nares clear without erythema, swelling, mucoid exudates. Oral hygiene is good. No erythema, swelling, or exudate. Tongue normal, non-obstructing. Tonsils not swollen or erythematous. Hearing  normal.  Neck: Supple, thyroid not palpable. No bruits, nodes or JVD. Respiratory: Respiratory effort normal.  BS equal and clear bilateral without rales, rhonci, wheezing or stridor. Cardio: Heart sounds are normal with regular  rate and rhythm and no murmurs, rubs or gallops. Peripheral pulses are normal and equal bilaterally without edema. No aortic or femoral bruits. Chest: symmetric with normal excursions and percussion.  Abdomen: Soft, with Nl bowel sounds. Nontender, no guarding, rebound, hernias, masses, or organomegaly.  Lymphatics: Non tender without lymphadenopathy.  Musculoskeletal: Full ROM all peripheral extremities, joint stability, 5/5 strength, and normal gait. Skin: Warm and dry without rashes, lesions, cyanosis, clubbing or  ecchymosis.  Neuro: Cranial nerves intact, reflexes equal bilaterally. Normal muscle tone, no cerebellar symptoms. Sensation intact.  Pysch: Alert and oriented X 3 with normal affect, insight and judgment appropriate.   Assessment and Plan  1. Annual Preventative/Screening Exam    2. Essential hypertension  - EKG 12-Lead - Urinalysis, Routine w reflex microscopic - Microalbumin / creatinine urine ratio - CBC with Differential/Platelet - COMPLETE METABOLIC PANEL WITH GFR - Magnesium - TSH  3. Hyperlipidemia, mixed  - EKG 12-Lead - Lipid panel - TSH  4. Abnormal glucose - EKG 12-Lead - Hemoglobin A1c - Insulin, random  5. Vitamin D deficiency  - VITAMIN D 25 Hydroxy  6. Testosterone deficiency  - Testosterone  7. BPH with obstruction/lower urinary tract symptoms  - PSA 8. Gastroesophageal  Reflux Disease   9. Screening-pulmonary TB  - TB Skin Test  10. Prostate cancer screening  - PSA  11. Screening for colorectal cancer  - POC Hemoccult Bld/Stl (3-Cd Ho me Screen); Future  12. Screening for AAA (aortic abdominal aneurysm)  - EKG 12-Lead  13. FHx: heart disease  - EKG 12-Lead  14. Fatigue,  - Microalbumin / creatinine urine ratio - Testosterone - CBC with Differential/Platelet - TSH  15. Medication management  - CBC with Differential/Platelet - COMPLETE METABOLIC PANEL WITH GFR - Magnesium - Lipid panel - TSH - Hemoglobin  A1c - Insulin, random - VITAMIN D 25 Hydroxy             Patient was counseled in prudent diet, weight control to achieve/maintain BMI less than 25, BP monitoring, regular exercise and medications as discussed.  Discussed med effects and SE's. Routine screening labs and tests as requested with regular follow-up as recommended. Over 40 minutes of exam, counseling, chart review and high complex critical decision making was performed   Kirtland Bouchard, MD

## 2022-09-14 NOTE — Patient Instructions (Signed)

## 2022-09-15 LAB — CBC WITH DIFFERENTIAL/PLATELET
Absolute Monocytes: 647 cells/uL (ref 200–950)
Basophils Absolute: 18 cells/uL (ref 0–200)
Basophils Relative: 0.3 %
Eosinophils Absolute: 43 cells/uL (ref 15–500)
Eosinophils Relative: 0.7 %
HCT: 41.4 % (ref 38.5–50.0)
Hemoglobin: 14.3 g/dL (ref 13.2–17.1)
Lymphs Abs: 1671 cells/uL (ref 850–3900)
MCH: 32.1 pg (ref 27.0–33.0)
MCHC: 34.5 g/dL (ref 32.0–36.0)
MCV: 92.8 fL (ref 80.0–100.0)
MPV: 9.9 fL (ref 7.5–12.5)
Monocytes Relative: 10.6 %
Neutro Abs: 3721 cells/uL (ref 1500–7800)
Neutrophils Relative %: 61 %
Platelets: 164 10*3/uL (ref 140–400)
RBC: 4.46 10*6/uL (ref 4.20–5.80)
RDW: 12.5 % (ref 11.0–15.0)
Total Lymphocyte: 27.4 %
WBC: 6.1 10*3/uL (ref 3.8–10.8)

## 2022-09-15 LAB — MICROALBUMIN / CREATININE URINE RATIO
Creatinine, Urine: 93 mg/dL (ref 20–320)
Microalb, Ur: <0.2 mg/dL

## 2022-09-15 LAB — INSULIN, RANDOM: Insulin: 7.8 u[IU]/mL

## 2022-09-15 LAB — URINALYSIS, ROUTINE W REFLEX MICROSCOPIC
Bilirubin Urine: NEGATIVE
Glucose, UA: NEGATIVE
Hgb urine dipstick: NEGATIVE
Ketones, ur: NEGATIVE
Leukocytes,Ua: NEGATIVE
Nitrite: NEGATIVE
Protein, ur: NEGATIVE
Specific Gravity, Urine: 1.015 (ref 1.001–1.035)
pH: 6 (ref 5.0–8.0)

## 2022-09-15 LAB — COMPLETE METABOLIC PANEL WITH GFR
AG Ratio: 1.6 (calc) (ref 1.0–2.5)
ALT: 30 U/L (ref 9–46)
AST: 35 U/L (ref 10–35)
Albumin: 4.6 g/dL (ref 3.6–5.1)
Alkaline phosphatase (APISO): 54 U/L (ref 35–144)
BUN: 22 mg/dL (ref 7–25)
CO2: 28 mmol/L (ref 20–32)
Calcium: 9.1 mg/dL (ref 8.6–10.3)
Chloride: 100 mmol/L (ref 98–110)
Creat: 1.1 mg/dL (ref 0.70–1.35)
Globulin: 2.8 g/dL (calc) (ref 1.9–3.7)
Glucose, Bld: 87 mg/dL (ref 65–99)
Potassium: 3.9 mmol/L (ref 3.5–5.3)
Sodium: 138 mmol/L (ref 135–146)
Total Bilirubin: 0.4 mg/dL (ref 0.2–1.2)
Total Protein: 7.4 g/dL (ref 6.1–8.1)
eGFR: 75 mL/min/{1.73_m2} (ref >=60)

## 2022-09-15 LAB — PSA: PSA: 0.91 ng/mL (ref <=4.00)

## 2022-09-15 LAB — TSH: TSH: 4.84 mIU/L — ABNORMAL HIGH (ref 0.40–4.50)

## 2022-09-15 LAB — HEMOGLOBIN A1C
Hgb A1c MFr Bld: 5.3 % of total Hgb (ref <5.7)
Mean Plasma Glucose: 105 mg/dL
eAG (mmol/L): 5.8 mmol/L

## 2022-09-15 LAB — LIPID PANEL
Cholesterol: 172 mg/dL (ref <200)
HDL: 48 mg/dL (ref >=40)
LDL Cholesterol (Calc): 99 mg/dL (calc)
Non-HDL Cholesterol (Calc): 124 mg/dL (calc) (ref <130)
Total CHOL/HDL Ratio: 3.6 (calc) (ref <5.0)
Triglycerides: 156 mg/dL — ABNORMAL HIGH (ref <150)

## 2022-09-15 LAB — MAGNESIUM: Magnesium: 2.1 mg/dL (ref 1.5–2.5)

## 2022-09-15 LAB — TESTOSTERONE: Testosterone: 263 ng/dL (ref 250–827)

## 2022-09-15 LAB — VITAMIN D 25 HYDROXY (VIT D DEFICIENCY, FRACTURES): Vit D, 25-Hydroxy: 88 ng/mL (ref 30–100)

## 2022-09-15 NOTE — Progress Notes (Signed)
<><><><><><><><><><><><><><><><><><><><><><><><><><><><><><><><><> <><><><><><><><><><><><><><><><><><><><><><><><><><><><><><><><><> -   Test results slightly outside the reference range are not unusual. If there is anything important, I will review this with you,  otherwise it is considered normal test values.  If you have further questions,  please do not hesitate to contact me at the office or via My Chart.  <><><><><><><><><><><><><><><><><><><><><><><><><><><><><><><><><> <><><><><><><><><><><><><><><><><><><><><><><><><><><><><><><><><>  -  Total Chol = 172  is Great   - But Triglycerides (     ) or fats in blood are too high                 (   Ideal or  Goal is less than 150  !  )    - Recommend avoid fried & greasy foods,  sweets / candy,   - Avoid white rice  (brown or wild rice or Quinoa is OK),   - Avoid white potatoes  (sweet potatoes are OK)   - Avoid anything made from white flour  - bagels, doughnuts, rolls, buns, biscuits, white and   wheat breads, pizza crust and traditional  pasta made of white flour & egg white  - (vegetarian pasta or spinach or wheat pasta is OK).    - Multi-grain bread is OK - like multi-grain flat bread or  sandwich thins.   - Avoid alcohol in excess.   - Exercise is also important. <><><><><><><><><><><><><><><><><><><><><><><><><><><><><><><><><>  -  TSH is slightly elevated which implies the Thyroid hormone is borderline low, but not enough to change meds at this point in time   - Will continue to monitor closely  <><><><><><><><><><><><><><><><><><><><><><><><><><><><><><><><><>  -  Testosterone  level is in the low normal  range <><><><><><><><><><><><><><><><><><><><><><><><><><><><><><><><><>  -  A1c - Normal - No Diabetes  <><><><><><><><><><><><><><><><><><><><><><><><><><><><><><><><><>  -  Vitamin D = 88 - Excellent- !   Please keep dose same  <><><><><><><><><><><><><><><><><><><><><><><><><><><><><><><><><>  -   PSA - Low  - Great - No prostate cancer <><><><><><><><><><><><><><><><><><><><><><><><><><><><><><><><><>  -   All Else - CBC - Kidneys - Electrolytes - Liver - Magnesium & Thyroid    - all  Normal / OK <><><><><><><><><><><><><><><><><><><><><><><><><><><><><><><><><> <><><><><><><><><><><><><><><><><><><><><><><><><><><><><><><><><>

## 2022-09-19 ENCOUNTER — Encounter: Payer: Self-pay | Admitting: Internal Medicine

## 2022-09-22 ENCOUNTER — Ambulatory Visit: Payer: Commercial Managed Care - PPO | Admitting: Podiatry

## 2022-10-18 ENCOUNTER — Ambulatory Visit: Payer: Commercial Managed Care - PPO | Admitting: Gastroenterology

## 2022-10-18 ENCOUNTER — Encounter: Payer: Self-pay | Admitting: Gastroenterology

## 2022-10-18 VITALS — BP 118/68 | HR 60 | Ht 69.0 in | Wt 175.6 lb

## 2022-10-18 DIAGNOSIS — K649 Unspecified hemorrhoids: Secondary | ICD-10-CM

## 2022-10-18 DIAGNOSIS — R1013 Epigastric pain: Secondary | ICD-10-CM

## 2022-10-18 DIAGNOSIS — R110 Nausea: Secondary | ICD-10-CM

## 2022-10-18 DIAGNOSIS — L290 Pruritus ani: Secondary | ICD-10-CM

## 2022-10-18 DIAGNOSIS — R109 Unspecified abdominal pain: Secondary | ICD-10-CM

## 2022-10-18 DIAGNOSIS — R12 Heartburn: Secondary | ICD-10-CM

## 2022-10-18 MED ORDER — SUCRALFATE 1 G PO TABS: 1.0000 g | ORAL_TABLET | Freq: Three times a day (TID) | ORAL | 0 refills | Status: DC

## 2022-10-18 NOTE — Progress Notes (Unsigned)
[place] VISIT   Primary Care Provider Unk Pinto, MD [address] [place] [address] [phone]  Referring Provider Unk Pinto, MD [address] [place],  [place] [address] [phone]  Patient Profile: Jay Brooks is a 61 y.o. male with a pmh significant for  The patient presents to the [facility] for an evaluation and management of problem(s) noted below:  Problem List No diagnosis found.  History of Present Illness    The patient does/does not take NSAIDs or BC/Goody Powder. Patient has/has not had an EGD. Patient has/has not had a Colonoscopy.  GI Review of Systems Positive as above Negative for  Pyrosis; Reflux; Regurgitation; Dysphagia; Odynophagia; Globus; Post-prandial cough; Nocturnal cough; Nasal regurgitation; Epigastric pain; Nausea; Vomiting; Hematemesis; Jaundice; Change in Appetite; Early satiety; Abdominal pain; Abdominal bloating; Eructation; Flatulence; Change in BM Frequency; Change in BM Consistency; Constipation; Diarrhea; Incontinence; Urgency; Tenesmus; Hematochezia; Melena  Review of Systems General: Denies fevers/chills/weight loss/night sweats HEENT: Denies oral lesions/sore throat/headaches/visual changes Cardiovascular: Denies chest pain/palpitations Pulmonary: Denies shortness of breath/cough Gastroenterological: See HPI Genitourinary: Denies darkened urine or hematuria Hematological: Denies easy bruising/bleeding Endocrine: Denies temperature intolerance Dermatological: Denies skin changes Psychological: Mood is stable Allergy & Immunology: Denies severe allergic reactions Musculoskeletal: Denies new arthralgias   Medications Current Outpatient Medications  Medication Sig Dispense Refill   albuterol (VENTOLIN HFA) 108 (90 Base) MCG/ACT inhaler 2 puffs     Azelastine HCl 137 MCG/SPRAY SOLN SMARTSIG:2 Spray(s) Both Nares Every Evening      bisoprolol-hydrochlorothiazide (ZIAC) 2.5-6.25 MG tablet TAKE 1 TABLET BY MOUTH EVERY DAY 30 tablet 2   calcium carbonate (TUMS EX) 750 MG chewable tablet Chew 1 tablet by mouth daily.     cholecalciferol (VITAMIN D) 25 MCG (1000 UNIT) tablet Take 1,000 Units by mouth daily.     clindamycin (CLEOCIN T) 1 % lotion Apply 1 application topically every evening.     famotidine (PEPCID) 20 MG tablet Take 20 mg by mouth daily.     fluticasone (FLONASE) 50 MCG/ACT nasal spray Place 2 sprays into both nostrils daily.      fluticasone (FLOVENT HFA) 110 MCG/ACT inhaler Inhale into the lungs 2 (two) times daily.     hydrocortisone cream 1 % Apply 1 application topically daily as needed for itching.     ipratropium (ATROVENT) 0.06 % nasal spray SMARTSIG:1 Spray(s) Both Nares Twice Daily PRN     ketotifen (ZADITOR) 0.025 % ophthalmic solution Uses prn 5 mL 0   loratadine (CLARITIN) 10 MG tablet Take 10 mg by mouth daily.     Methylcobalamin (B12) 5000 MCG SUBL Place under the tongue.     montelukast (SINGULAIR) 10 MG tablet TAKE 1 TABLET BY MOUTH DAILY FOR ALLERGIES 30 tablet 3   Olopatadine HCl 0.2 % SOLN Place 1 drop into both eyes daily as needed.     OVER THE COUNTER MEDICATION Take 2 capsules by mouth at bedtime. Serenagen otc supplement     RABEprazole (ACIPHEX) 20 MG tablet Take 1 tablet (20 mg total) by mouth 2 (two) times daily. 180 tablet 3   tamsulosin (FLOMAX) 0.4 MG CAPS capsule TAKE 1 CAPSULE AT BEDTIME FOR PROSTATE (Patient taking differently: Take 0.4 mg by mouth at bedtime.) 90 capsule 2   Testosterone (ANDROGEL PUMP) 20.25 MG/ACT (1.62%) GEL 2 pumps each arm daily, (4 pumps total) 75 g 4   triamcinolone cream (KENALOG) 0.1 % Apply 1 application topically 2 (two) times daily as needed for  itching.     Zinc 50 MG TABS Take 1 tablet Daily  0   No current facility-administered medications for this visit.    Allergies Allergies  Allergen Reactions   Allegra [Fexofenadine] Other (See  Comments)    Worsened insomnia   Benzoyl Peroxide     blisters   Decadron [Dexamethasone]     Reflux & Hiccups, GI upset   Doxycycline Nausea Only   Levaquin [Levofloxacin] Nausea And Vomiting    Patient states that after he took levaquin this past time he had nausea with vomiting    Histories Past Medical History:  Diagnosis Date   Allergy    Chronic cough    Chronic sinusitis    Diverticulitis 2018   Mild   Diverticulosis of colon (without mention of hemorrhage)    Early satiety    Esophageal motility disorder 12/2018   Family history of adverse reaction to anesthesia    mom has PONV   GERD (gastroesophageal reflux disease)    History of kidney stones    2017   History of left inguinal hernia    Hypertension    Insomnia    Lyme disease    QUESTIONABLE   Pulmonary nodules 10/2018   Scattered tiny subpleural pulmonary nodules at the peripheral right lung base, largest 3 mm   Right inguinal hernia 2017   Small fat containing right inguinal hernia   Sleep apnea    no CPAP   Sleep deficient    Testosterone deficiency    Vitamin D deficiency    Wears glasses    Past Surgical History:  Procedure Laterality Date   BRAVO [place] STUDY N/A 05/02/2019   Procedure: BRAVO [place] STUDY;  Surgeon: Milus Banister, MD;  Location: [facility];  Service: Endoscopy;  Laterality: N/A;   COLONOSCOPY  02/2011   ESOPHAGOGASTRODUODENOSCOPY (EGD) WITH PROPOFOL N/A 05/02/2019   Procedure: ESOPHAGOGASTRODUODENOSCOPY (EGD) WITH PROPOFOL;  Surgeon: Milus Banister, MD;  Location: [facility];  Service: Endoscopy;  Laterality: N/A;   HERNIA REPAIR     NASAL SEPTOPLASTY W/ TURBINOPLASTY Bilateral 04/21/2021   Procedure: NASAL SEPTOPLASTY WITH  BILATERAL TURBINATE REDUCTION;  Surgeon: Leta Baptist, MD;  Location: [facility] OR;  Service: ENT;  Laterality: Bilateral;   TOE SURGERY     INGROWN TOE NAIL, CHILDHOOD   UPPER GASTROINTESTINAL ENDOSCOPY     VASECTOMY     Social History   Socioeconomic History    Marital status: Married    Spouse name: Not on file   Number of children: 1   Years of education: Not on file   Highest education level: Not on file  Occupational History   Occupation: PILOT    Employer: [employer]  Tobacco Use   Smoking status: Never   Smokeless tobacco: Never  Vaping Use   Vaping Use: Never used  Substance and Sexual Activity   Alcohol use: Yes    Alcohol/week: 2.0 - 3.0 standard drinks of alcohol    Types: 2 - 3 Cans of beer per week   Drug use: No   Sexual activity: Not on file  Other Topics Concern   Not on file  Social History Narrative   Daily caffeine    Social Determinants of Health   Financial Resource Strain: Not on file  Food Insecurity: Not on file  Transportation Needs: Not on file  Physical Activity: Not on file  Stress: Not on file  Social Connections: Not on file  Intimate Partner Violence: Not on file  Family History  Problem Relation Age of Onset   Breast cancer Maternal Grandmother    Colon polyps Brother    Colon polyps Maternal Uncle    Cancer Mother 64       pancreatic   Colon cancer Neg Hx    Esophageal cancer Neg Hx    Stomach cancer Neg Hx    Ulcerative colitis Neg Hx    I have reviewed his medical, social, and family history in detail and updated the electronic medical record as necessary.    PHYSICAL EXAMINATION  There were no vitals taken for this visit. Wt Readings from Last 3 Encounters:  09/14/22 173 lb 12.8 oz (78.8 kg)  05/30/22 172 lb 3.2 oz (78.1 kg)  05/19/22 173 lb 3.2 oz (78.6 kg)   GEN: NAD, appears stated age, doesn't appear chronically ill PSYCH: Cooperative, without pressured speech EYE: Conjunctivae pink, sclerae anicteric ENT: MMM, without oral ulcers, no erythema or exudates noted NECK: Supple CV: RR without R/Gs  RESP: CTAB posteriorly, without wheezing GI: NABS, soft, NT/ND, without rebound or guarding, no HSM appreciated GU: DRE shows MSK/EXT: _ edema, no palmar erythema SKIN:  No jaundice, no spider angiomata, no concerning rashes NEURO:  Alert & Oriented x 3, no focal deficits, no evidence of asterixis   REVIEW OF DATA  I reviewed the following data at the time of this encounter:  GI Procedures and Studies  2022 Colonoscopy - Diverticulosis in the left colon. - Small internal hemorrhoids. - The examination was otherwise normal on direct and retroflexion views. - No polyps or cancers.  2023 EGD - Mild, non-specific distal gastritis. Biopsied to check for H. pylori. - A few small (4-31m) soft, fleshy, classic appearing fundic gland polyps in the proximal stomach. - The examination was otherwise normal.  Pathology Diagnosis Surgical [P], gastric - GASTRIC MUCOSA WITH CHANGES OF REACTIVE GASTROPATHY. - NO HELICOBACTER PYLORI IDENTIFIED.  Laboratory Studies  ***  Imaging Studies  2020 Barium Swallow IMPRESSION: Disruption of primary peristalsis on some swallows consistent with nonspecific esophageal motility disorder. Otherwise unremarkable.   ASSESSMENT  Mr. PDirris a 61y.o. male with a pmh significant for The patient is seen today for evaluation and management of:  No diagnosis found.  ***   PLAN  There are no diagnoses linked to this encounter.   No orders of the defined types were placed in this encounter.   New Prescriptions   No medications on file   Modified Medications   No medications on file    Planned Follow Up No follow-ups on file.   Total Time in Face-to-Face and in Coordination of Care for patient including independent/personal interpretation/review of prior testing, medical history, examination, medication adjustment, communicating results with the patient directly, and documentation within the EHR is ***.   GJustice Britain MD [facility] Office # [phone]

## 2022-10-18 NOTE — Patient Instructions (Addendum)
Alternate Equate Suppositories with Calmol -4 suppositories.   Let us know how your doing in a few weeks, if needed we can give prescription for Anusol Suppositories.   We have sent the following medications to your pharmacy for you to pick up at your convenience: Carafate    How to Take a Sitz Bath A sitz bath is a warm water bath that may be used to care for your rectum, genital area, or the area between your rectum and genitals (perineum). In a sitz bath, the water only comes up to your hips and covers your buttocks. A sitz bath may be done in a bathtub or with a portable sitz bath that fits over the toilet. Your health care provider may recommend a sitz bath to help: Relieve pain and discomfort after delivering a baby. Relieve pain and itching from hemorrhoids or anal fissures. Relieve pain after certain surgeries. Relax muscles that are sore or tight. How to take a sitz bath Take 2-4 sitz baths a day, or as many as told by your health care provider. Bathtub sitz bath To take a sitz bath in a bathtub: Partially fill a bathtub with warm water. The water should be deep enough to cover your hips and buttocks when you are sitting in the bathtub. Follow your health care provider's instructions if you are told to put medicine in the water. Sit in the water. Open the bathtub drain a little, and leave it open during your bath. Turn on the warm water again, enough to replace the water that is draining out. Keep the water running throughout your bath. This helps keep the water at the right level and temperature. Soak in the water for 15-20 minutes, or as long as told by your health care provider. When you are done, be careful when you stand up. You may feel dizzy. After the sitz bath, pat yourself dry. Do not rub your skin to dry it.  Over-the-toilet sitz bath To take a sitz bath with an over-the-toilet basin: Follow the manufacturer's instructions. Fill the basin with warm water. Follow  your health care provider's instructions if you were told to put medicine in the water. Sit on the seat. Make sure the water covers your buttocks and perineum. Soak in the water for 15-20 minutes, or as long as told by your health care provider. After the sitz bath, pat yourself dry. Do not rub your skin to dry it. Clean and dry the basin between uses. Discard the basin if it cracks, or according to the manufacturer's instructions.  Contact a health care provider if: Your pain or itching gets worse. Stop doing sitz baths if your symptoms get worse. You have new symptoms. Stop doing sitz baths until you talk with your health care provider. Summary A sitz bath is a warm water bath in which the water only comes up to your hips and covers your buttocks. Your health care provider may recommend a sitz bath to help relieve pain and discomfort after delivering a baby, relieve pain and itching from hemorrhoids or anal fissures, relieve pain after certain surgeries, or help to relax muscles that are sore or tight. Take 2-4 sitz baths a day, or as many as told by your health care provider. Soak in the water for 15-20 minutes. Stop doing sitz baths if your symptoms get worse. This information is not intended to replace advice given to you by your health care provider. Make sure you discuss any questions you have with your health care  provider. Document Revised: 01/25/2022 Document Reviewed: 01/25/2022 Elsevier Patient Education  [place].   Due to recent changes in healthcare laws, you may see the results of your imaging and laboratory studies on MyChart before your provider has had a chance to review them.  We understand that in some cases there may be results that are confusing or concerning to you. Not all laboratory results come back in the same time frame and the provider may be waiting for multiple results in order to interpret others.  Please give [place] 48 hours in order for your provider to  thoroughly review all the results before contacting the office for clarification of your results.   _______________________________________________________  If you are age 27 or older, your body mass index should be between 23-30. Your Body mass index is 25.93 kg/m. If this is out of the aforementioned range listed, please consider follow up with your Primary Care Provider.  If you are age 60 or younger, your body mass index should be between 19-25. Your Body mass index is 25.93 kg/m. If this is out of the aformentioned range listed, please consider follow up with your Primary Care Provider.   ________________________________________________________  The [place] [facility] providers would like to encourage you to use [facility] to communicate with providers for non-urgent requests or questions.  Due to long hold times on the telephone, sending your provider a message by [facility] may be a faster and more efficient way to get a response.  Please allow 48 business hours for a response.  Please remember that this is for non-urgent requests.  _______________________________________________________  Thank you for choosing me and [facility].  Dr. Rush Landmark

## 2022-10-19 ENCOUNTER — Encounter: Payer: Self-pay | Admitting: Gastroenterology

## 2022-10-20 DIAGNOSIS — R1013 Epigastric pain: Secondary | ICD-10-CM | POA: Insufficient documentation

## 2022-10-20 DIAGNOSIS — K649 Unspecified hemorrhoids: Secondary | ICD-10-CM | POA: Insufficient documentation

## 2022-10-20 DIAGNOSIS — R110 Nausea: Secondary | ICD-10-CM | POA: Insufficient documentation

## 2022-10-20 DIAGNOSIS — L290 Pruritus ani: Secondary | ICD-10-CM | POA: Insufficient documentation

## 2022-10-20 DIAGNOSIS — R12 Heartburn: Secondary | ICD-10-CM | POA: Insufficient documentation

## 2022-10-21 ENCOUNTER — Other Ambulatory Visit: Payer: Self-pay

## 2022-10-21 DIAGNOSIS — Z1211 Encounter for screening for malignant neoplasm of colon: Secondary | ICD-10-CM

## 2022-10-21 LAB — POC HEMOCCULT BLD/STL (HOME/3-CARD/SCREEN)
Card #2 Fecal Occult Blod, POC: NEGATIVE
Card #3 Fecal Occult Blood, POC: NEGATIVE
Fecal Occult Blood, POC: NEGATIVE

## 2022-10-24 ENCOUNTER — Encounter: Payer: Self-pay | Admitting: Internal Medicine

## 2022-10-24 DIAGNOSIS — Z1211 Encounter for screening for malignant neoplasm of colon: Secondary | ICD-10-CM | POA: Diagnosis not present

## 2022-10-24 DIAGNOSIS — Z1212 Encounter for screening for malignant neoplasm of rectum: Secondary | ICD-10-CM

## 2022-11-01 NOTE — Progress Notes (Deleted)
Future Appointments  Date Time Provider Department  11/02/2022  9:30 AM Unk Pinto, MD [facility]  12/19/2022  2:30 PM Darrol Jump, NP [facility]  12/20/2022  2:15 PM Frann Rider, NP [facility]  12/21/2022 10:30 AM Mansouraty, Telford Nab., MD [facility]  03/21/2023  2:30 PM Unk Pinto, MD [facility]  09/20/2023  2:00 PM Unk Pinto, MD [facility]    History of Present Illness:     Patient is a very nice 61 y.o. MWM  with  HTN, HLD, Prediabetes, Testosterone Deficiency  and Vitamin D Deficiency who present for f/u after treatment at an [facility] urgent care for sinusitis and still has persistent sx's.  Omn 123/23/2023, he was prescribes Amoxil 500 mg tid  x 10 days & Prednisone 20 mg bid x 5 days.     Medications  Current Outpatient Medications (Endocrine & Metabolic):    Testosterone (ANDROGEL PUMP) 20.25 MG/ACT (1.62%) GEL, 2 pumps each arm daily, (4 pumps total)  Current Outpatient Medications (Cardiovascular):    bisoprolol-hydrochlorothiazide (ZIAC) 2.5-6.25 MG tablet, TAKE 1 TABLET BY MOUTH EVERY DAY  Current Outpatient Medications (Respiratory):    albuterol (VENTOLIN HFA) 108 (90 Base) MCG/ACT inhaler, 2 puffs   Azelastine HCl 137 MCG/SPRAY SOLN, SMARTSIG:2 Spray(s) Both Nares Every Evening   fluticasone (FLONASE) 50 MCG/ACT nasal spray, Place 2 sprays into both nostrils daily.    fluticasone (FLOVENT HFA) 110 MCG/ACT inhaler, Inhale into the lungs 2 (two) times daily.   ipratropium (ATROVENT) 0.06 % nasal spray, SMARTSIG:1 Spray(s) Both Nares Twice Daily PRN   loratadine (CLARITIN) 10 MG tablet, Take 10 mg by mouth daily.   montelukast (SINGULAIR) 10 MG tablet, TAKE 1 TABLET BY MOUTH DAILY FOR ALLERGIES   Current Outpatient Medications (Hematological):    Methylcobalamin (B12) 5000 MCG SUBL, Place under the tongue.  Current Outpatient Medications (Other):    calcium carbonate (TUMS EX) 750 MG chewable tablet, Chew 1 tablet by mouth daily.    cholecalciferol (VITAMIN D) 25 MCG (1000 UNIT) tablet, Take 1,000 Units by mouth daily.   clindamycin (CLEOCIN T) 1 % lotion, Apply 1 application topically every evening.   famotidine (PEPCID) 20 MG tablet, Take 20 mg by mouth daily.   hydrocortisone cream 1 %, Apply 1 application topically daily as needed for itching.   ketotifen (ZADITOR) 0.025 % ophthalmic solution, Uses prn   Olopatadine HCl 0.2 % SOLN, Place 1 drop into both eyes daily as needed.   RABEprazole (ACIPHEX) 20 MG tablet, Take 1 tablet (20 mg total) by mouth 2 (two) times daily.   sucralfate (CARAFATE) 1 g tablet, Take 1 tablet (1 g total) by mouth 4 (four) times daily -  with meals and at bedtime.   tamsulosin (FLOMAX) 0.4 MG CAPS capsule, TAKE 1 CAPSULE AT BEDTIME FOR PROSTATE (Patient taking differently: Take 0.4 mg by mouth at bedtime.)   triamcinolone cream (KENALOG) 0.1 %, Apply 1 application topically 2 (two) times daily as needed for itching.   Zinc 50 MG TABS, Take 1 tablet Daily  Problem list He has Cough; GERD (gastroesophageal reflux disease); Chronic rhinitis; Testosterone Deficiency; Hyperlipidemia, mixed; Medication management; Labile hypertension; Abnormal glucose; Vitamin D deficiency; Prostatism; FHx: heart disease; Essential hypertension; Chronic insomnia; OSA (obstructive sleep apnea); Intolerance of continuous positive airway pressure (CPAP) ventilation; Status post nasal septoplasty; Encounter for [employer]) examination; Psychophysiological insomnia; Nausea; Epigastric pain; Functional heartburn; Rectal itching; and Hemorrhoids on their problem list.   Observations/Objective:  There were no vitals taken for this visit.  HEENT - WNL. Neck - supple.  Chest - Clear equal BS. Cor - Nl HS. RRR w/o sig MGR. PP 1(+). No edema. MS- FROM w/o deformities.  Gait Nl. Neuro -  Nl w/o focal abnormalities.   Assessment and Plan:      Follow Up Instructions:        I discussed the  assessment and treatment plan with the patient. The patient was provided an opportunity to ask questions and all were answered. The patient agreed with the plan and demonstrated an understanding of the instructions.       The patient was advised to call back or seek an in-person evaluation if the symptoms worsen or if the condition fails to improve as anticipated.    Kirtland Bouchard, MD

## 2022-11-02 ENCOUNTER — Ambulatory Visit: Payer: Commercial Managed Care - PPO | Admitting: Nurse Practitioner

## 2022-11-02 ENCOUNTER — Other Ambulatory Visit: Payer: Self-pay

## 2022-11-02 ENCOUNTER — Encounter: Payer: Self-pay | Admitting: Internal Medicine

## 2022-11-02 VITALS — BP 118/80 | HR 65 | Temp 97.6°F | Resp 17 | Ht 69.0 in | Wt 171.0 lb

## 2022-11-02 DIAGNOSIS — R059 Fever, unspecified: Secondary | ICD-10-CM

## 2022-11-02 DIAGNOSIS — R509 Fever, unspecified: Secondary | ICD-10-CM | POA: Diagnosis not present

## 2022-11-02 DIAGNOSIS — U071 COVID-19: Secondary | ICD-10-CM

## 2022-11-02 DIAGNOSIS — J0141 Acute recurrent pansinusitis: Secondary | ICD-10-CM

## 2022-11-02 LAB — POC INFLUENZA A&B (BINAX/QUICKVUE)
Influenza A, POC: NEGATIVE
Influenza B, POC: NEGATIVE

## 2022-11-02 LAB — POC COVID19 BINAXNOW: SARS Coronavirus 2 Ag: POSITIVE — AB

## 2022-11-02 MED ORDER — PROMETHAZINE-DM 6.25-15 MG/5ML PO SYRP: 5.0000 mL | ORAL_SOLUTION | Freq: Four times a day (QID) | ORAL | 1 refills | Status: DC | PRN

## 2022-11-02 MED ORDER — AZITHROMYCIN 250 MG PO TABS: ORAL_TABLET | ORAL | 1 refills | Status: DC

## 2022-11-02 NOTE — Patient Instructions (Signed)
COVID Take tylenol PRN temp 101+ Push hydration Regular ambulation or calf exercises exercises for clot prevention and 81 mg ASA unless contraindicated Sx supportive therapy suggested Follow up via mychart or telephone if needed Advised patient obtain O2 monitor; present to ED if persistently <90% or with severe dyspnea, CP, fever uncontrolled by tylenol, confusion, sudden decline Should remain in isolation until at least 5 days from onset of sx, 24-48 hours fever free without tylenol, sx such as cough are improved.

## 2022-11-02 NOTE — Progress Notes (Signed)
THIS ENCOUNTER IS A VIRTUAL VISIT DUE TO COVID-19 - PATIENT WAS NOT SEEN IN THE OFFICE.  PATIENT HAS CONSENTED TO VIRTUAL VISIT / TELEMEDICINE VISIT   Virtual Visit via telephone Note  I connected with  Jay Brooks on 11/02/2022 by telephone.  I verified that I am speaking with the correct person using two identifiers.    I discussed the limitations of evaluation and management by telemedicine and the availability of in person appointments. The patient expressed understanding and agreed to proceed.  History of Present Illness:  BP 118/80   Pulse 65   Temp 97.6 F (36.4 C)   Resp 17   Ht '5\' 9"'$  (1.753 m)   Wt 171 lb (77.6 kg)   SpO2 98%   BMI 25.25 kg/m  61 y.o. patient contacted office reporting URI sx . he tested positive by test at office parking lot. OV was conducted by telephone to minimize exposure. This patient was vaccinated for covid 19, last 06/2020     Sx began 7-10  days ago with Fever, productive cough of brown mucus, Congestion   Treatments tried so far: Urgent care Saturday- said had bronchitis and sinusitis and started on Amoxicillin and Prednisone  Exposures: unknown   Medications  Current Outpatient Medications (Endocrine & Metabolic):    Testosterone (ANDROGEL PUMP) 20.25 MG/ACT (1.62%) GEL, 2 pumps each arm daily, (4 pumps total)  Current Outpatient Medications (Cardiovascular):    bisoprolol-hydrochlorothiazide (ZIAC) 2.5-6.25 MG tablet, TAKE 1 TABLET BY MOUTH EVERY DAY  Current Outpatient Medications (Respiratory):    albuterol (VENTOLIN HFA) 108 (90 Base) MCG/ACT inhaler, 2 puffs   Azelastine HCl 137 MCG/SPRAY SOLN, SMARTSIG:2 Spray(s) Both Nares Every Evening   fluticasone (FLONASE) 50 MCG/ACT nasal spray, Place 2 sprays into both nostrils daily.    fluticasone (FLOVENT HFA) 110 MCG/ACT inhaler, Inhale into the lungs 2 (two) times daily.   loratadine (CLARITIN) 10 MG tablet, Take 10 mg by mouth daily.   montelukast (SINGULAIR) 10 MG tablet, TAKE  1 TABLET BY MOUTH DAILY FOR ALLERGIES   Current Outpatient Medications (Hematological):    Methylcobalamin (B12) 5000 MCG SUBL, Place under the tongue.  Current Outpatient Medications (Other):    calcium carbonate (TUMS EX) 750 MG chewable tablet, Chew 1 tablet by mouth daily.   cholecalciferol (VITAMIN D) 25 MCG (1000 UNIT) tablet, Take 1,000 Units by mouth daily.   clindamycin (CLEOCIN T) 1 % lotion, Apply 1 application topically every evening.   famotidine (PEPCID) 20 MG tablet, Take 20 mg by mouth daily.   hydrocortisone cream 1 %, Apply 1 application topically daily as needed for itching.   ketotifen (ZADITOR) 0.025 % ophthalmic solution, Uses prn   RABEprazole (ACIPHEX) 20 MG tablet, Take 1 tablet (20 mg total) by mouth 2 (two) times daily.   sucralfate (CARAFATE) 1 g tablet, Take 1 tablet (1 g total) by mouth 4 (four) times daily -  with meals and at bedtime.   tamsulosin (FLOMAX) 0.4 MG CAPS capsule, TAKE 1 CAPSULE AT BEDTIME FOR PROSTATE (Patient taking differently: Take 0.4 mg by mouth at bedtime.)   triamcinolone cream (KENALOG) 0.1 %, Apply 1 application topically 2 (two) times daily as needed for itching.   Zinc 50 MG TABS, Take 1 tablet Daily   Olopatadine HCl 0.2 % SOLN, Place 1 drop into both eyes daily as needed. (Patient not taking: Reported on 11/02/2022)  Allergies:  Allergies  Allergen Reactions   Allegra [Fexofenadine] Other (See Comments)    Worsened insomnia  Benzoyl Peroxide     blisters   Decadron [Dexamethasone]     Reflux & Hiccups, GI upset   Doxycycline Nausea Only   Levaquin [Levofloxacin] Nausea And Vomiting    Patient states that after he took levaquin this past time he had nausea with vomiting    Problem list He has Cough; GERD (gastroesophageal reflux disease); Chronic rhinitis; Testosterone Deficiency; Hyperlipidemia, mixed; Medication management; Labile hypertension; Abnormal glucose; Vitamin D deficiency; Prostatism; FHx: heart disease;  Essential hypertension; Chronic insomnia; OSA (obstructive sleep apnea); Intolerance of continuous positive airway pressure (CPAP) ventilation; Status post nasal septoplasty; Encounter for [employer]) examination; Psychophysiological insomnia; Nausea; Epigastric pain; Functional heartburn; Rectal itching; and Hemorrhoids on their problem list.   Social History:   reports that he has never smoked. He has never used smokeless tobacco. He reports current alcohol use of about 2.0 - 3.0 standard drinks of alcohol per week. He reports that he does not use drugs.  Observations/Objective:  General : Well sounding patient in no apparent distress HEENT: no hoarseness, no cough for duration of visit Lungs: speaks in complete sentences, no audible wheezing, no apparent distress Neurological: alert, oriented x 3 Psychiatric: pleasant, judgement appropriate   Assessment and Plan:  Covid 19 Covid 19 positive per rapid screening test in office parking lot Risk factors include: has Cough; GERD (gastroesophageal reflux disease); Chronic rhinitis; Testosterone Deficiency; Hyperlipidemia, mixed; Medication management; Labile hypertension; Abnormal glucose; Vitamin D deficiency; Prostatism; FHx: heart disease; Essential hypertension; Chronic insomnia; OSA (obstructive sleep apnea); Intolerance of continuous positive airway pressure (CPAP) ventilation; Status post nasal septoplasty; Encounter for [employer]) examination; Psychophysiological insomnia; Nausea; Epigastric pain; Functional heartburn; Rectal itching; and Hemorrhoids on their problem list.  Symptoms are: mild Immue support reviewed  Take tylenol PRN temp 101+ Push hydration Regular ambulation or calf exercises exercises for clot prevention and 81 mg ASA unless contraindicated Sx supportive therapy suggested Follow up via mychart or telephone if needed Advised patient obtain O2 monitor; present to ED  if persistently <90% or with severe dyspnea, CP, fever uncontrolled by tylenol, confusion, sudden decline Should remain in isolation until at least 5 days from onset of sx, 24-48 hours fever free without tylenol, sx such as cough are improved.   Woodley was seen today for cough.  Diagnoses and all orders for this visit:   Cough with fever -     POC Influenza A&B (Binax test)- negative -     POC COVID-19- positive  COVID Continue to push fluids. Continue  Vit C, Vit D, Zinc Unable to take steroids due to hiccups -     azithromycin (ZITHROMAX) 250 MG tablet; Take 2 tablets (500 mg) on  Day 1,  followed by 1 tablet (250 mg) once daily on Days 2 through 5. -     promethazine-dextromethorphan (PROMETHAZINE-DM) 6.25-15 MG/5ML syrup; Take 5 mLs by mouth 4 (four) times daily as needed for cough.      Follow Up Instructions:  I discussed the assessment and treatment plan with the patient. The patient was provided an opportunity to ask questions and all were answered. The patient agreed with the plan and demonstrated an understanding of the instructions.   The patient was advised to call back or seek an in-person evaluation if the symptoms worsen or if the condition fails to improve as anticipated.  I provided 20 minutes of non-face-to-face time during this encounter.   Alycia Rossetti, NP

## 2022-11-04 ENCOUNTER — Encounter: Payer: Self-pay | Admitting: Nurse Practitioner

## 2022-11-18 ENCOUNTER — Other Ambulatory Visit: Payer: Self-pay | Admitting: Gastroenterology

## 2022-11-25 ENCOUNTER — Encounter: Payer: Self-pay | Admitting: Nurse Practitioner

## 2022-11-25 ENCOUNTER — Other Ambulatory Visit: Payer: Self-pay | Admitting: Nurse Practitioner

## 2022-11-25 ENCOUNTER — Other Ambulatory Visit: Payer: Self-pay

## 2022-11-25 ENCOUNTER — Ambulatory Visit (INDEPENDENT_AMBULATORY_CARE_PROVIDER_SITE_OTHER): Payer: Commercial Managed Care - PPO | Admitting: Nurse Practitioner

## 2022-11-25 VITALS — BP 110/70 | HR 60 | Temp 98.0°F | Resp 17 | Ht 69.0 in | Wt 173.4 lb

## 2022-11-25 DIAGNOSIS — I10 Essential (primary) hypertension: Secondary | ICD-10-CM

## 2022-11-25 DIAGNOSIS — G4483 Primary cough headache: Secondary | ICD-10-CM | POA: Diagnosis not present

## 2022-11-25 DIAGNOSIS — J029 Acute pharyngitis, unspecified: Secondary | ICD-10-CM

## 2022-11-25 LAB — POC INFLUENZA A&B (BINAX/QUICKVUE)
Influenza A, POC: NEGATIVE
Influenza B, POC: NEGATIVE

## 2022-11-25 LAB — POC COVID19 BINAXNOW: SARS Coronavirus 2 Ag: NEGATIVE

## 2022-11-25 MED ORDER — BENZONATATE 100 MG PO CAPS: 100.0000 mg | ORAL_CAPSULE | Freq: Four times a day (QID) | ORAL | 1 refills | Status: DC | PRN

## 2022-11-25 NOTE — Patient Instructions (Signed)
Sore Throat When you have a sore throat, your throat may feel: Tender. Burning. Irritated. Scratchy. Painful when you swallow. Painful when you talk. Many things can cause a sore throat, such as: An infection. Allergies. Dry air. Smoke or pollution. Radiation treatment for cancer. Gastroesophageal reflux disease (GERD). A tumor. A sore throat can be the first sign of another sickness. It can happen with other problems, like: Coughing. Sneezing. Fever. Swelling of the glands in the neck. Most sore throats go away without treatment. Follow these instructions at home:     Medicines Take over-the-counter and prescription medicines only as told by your doctor. Children often get sore throats. Do not give your child aspirin. Use throat sprays to soothe your throat as told by your health care provider. Managing pain To help with pain: Sip warm liquids, such as broth, herbal tea, or warm water. Eat or drink cold or frozen liquids, such as frozen ice pops. Rinse your mouth (gargle) with a salt water mixture 3-4 times a day or as needed. To make salt water, dissolve -1 tsp (3-6 g) of salt in 1 cup (237 mL) of warm water. Do not swallow this mixture. Suck on hard candy or throat lozenges. Put a cool-mist humidifier in your bedroom at night. Sit in the bathroom with the door closed for 5-10 minutes while you run hot water in the shower. General instructions Do not smoke or use any products that contain nicotine or tobacco. If you need help quitting, ask your doctor. Get plenty of rest. Drink enough fluid to keep your pee (urine) pale yellow. Wash your hands often for at least 20 seconds with soap and water. If soap and water are not available, use hand sanitizer. Contact a doctor if: You have a fever for more than 2-3 days. You keep having symptoms for more than 2-3 days. Your throat does not get better in 7 days. You have a fever and your symptoms suddenly get worse. Your  child who is 3 months to 49 years old has a temperature of 102.32F (39C) or higher. Get help right away if: You have trouble breathing. You cannot swallow fluids, soft foods, or your spit. You have swelling in your throat or neck that gets worse. You feel like you may vomit (nauseous) and this feeling lasts a long time. You cannot stop vomiting. These symptoms may be an emergency. Get help right away. Call your local emergency services (911 in the [place] Do not wait to see if the symptoms will go away. Do not drive yourself to the hospital. Summary A sore throat is a painful, burning, irritated, or scratchy throat. Many things can cause a sore throat. Take over-the-counter medicines only as told by your doctor. Get plenty of rest. Drink enough fluid to keep your pee (urine) pale yellow. Contact a doctor if your symptoms get worse or your sore throat does not get better within 7 days. This information is not intended to replace advice given to you by your health care provider. Make sure you discuss any questions you have with your health care provider. Document Revised: 01/20/2021 Document Reviewed: 01/20/2021 Elsevier Patient Education  [employer].

## 2022-11-25 NOTE — Progress Notes (Signed)
Assessment and Plan:  Jay Brooks was seen today for cough.  Diagnoses and all orders for this visit:  Cough headache Has ongoing issues with cough, believes was slightly more frequent -     POC Influenza A&B (Binax test) -     POC COVID-19 -     benzonatate (TESSALON PERLES) 100 MG capsule; Take 1 capsule (100 mg total) by mouth every 6 (six) hours as needed for cough.  Essential hypertension - continue medications, DASH diet, exercise and monitor at home. Call if greater than 130/80.   Sore throat Use Tessalon Perles to try to decrease cough Use cepacol lozenges as needed Salt water gargles If develops fever, white patches on throat notify the office      Further disposition pending results of labs. Discussed med's effects and SE's.   Over 30 minutes of exam, counseling, chart review, and critical decision making was performed.   Future Appointments  Date Time Provider [place]  12/19/2022  2:30 PM Darrol Jump, NP [facility] None  12/20/2022  2:15 PM Frann Rider, NP [facility] None  12/21/2022 10:30 AM Mansouraty, Telford Nab., MD [facility]  03/21/2023  2:30 PM Unk Pinto, MD [facility] None  09/20/2023  2:00 PM Unk Pinto, MD [facility] None    ------------------------------------------------------------------------------------------------------------------   HPI BP 110/70   Pulse 60   Temp 98 F (36.7 C)   Resp 17   Wt 173 lb 6.4 oz (78.7 kg)   SpO2 98%   BMI 25.61 kg/m   61 y.o.male presents for sore throat and cough.  Pt was treated for Covid 11/02/22, could not take steroids due to hiccups when he takes the medication.  States he continues to have symptoms of red. Irritated sore throat.  Continues to have a dry cough.   Has a history of recurrent cough and has seen GI and is using Carafate, Pepcid and Aciphex.   BP is currently well controlled with Ziac 25/6.25 mg QD.  Denies chest pain and dizziness.  BP Readings from Last 3  Encounters:  11/25/22 110/70  11/02/22 118/80  10/18/22 118/68     Past Medical History:  Diagnosis Date   Allergy    Chronic cough    Chronic sinusitis    Diverticulitis 2018   Mild   Diverticulosis of colon (without mention of hemorrhage)    Early satiety    Esophageal motility disorder 12/2018   Family history of adverse reaction to anesthesia    mom has PONV   GERD (gastroesophageal reflux disease)    History of kidney stones    2017   History of left inguinal hernia    Hypertension    Insomnia    Lyme disease    QUESTIONABLE   Pulmonary nodules 10/2018   Scattered tiny subpleural pulmonary nodules at the peripheral right lung base, largest 3 mm   Right inguinal hernia 2017   Small fat containing right inguinal hernia   Sleep apnea    no CPAP   Sleep deficient    Testosterone deficiency    Vitamin D deficiency    Wears glasses      Allergies  Allergen Reactions   Allegra [Fexofenadine] Other (See Comments)    Worsened insomnia   Benzoyl Peroxide     blisters   Decadron [Dexamethasone]     Reflux & Hiccups, GI upset   Doxycycline Nausea Only   Levaquin [Levofloxacin] Nausea And Vomiting    Patient states that after he took levaquin this past time he  had nausea with vomiting    Current Outpatient Medications on File Prior to Visit  Medication Sig   albuterol (VENTOLIN HFA) 108 (90 Base) MCG/ACT inhaler 2 puffs   Azelastine HCl 137 MCG/SPRAY SOLN SMARTSIG:2 Spray(s) Both Nares Every Evening   azithromycin (ZITHROMAX) 250 MG tablet Take 2 tablets (500 mg) on  Day 1,  followed by 1 tablet (250 mg) once daily on Days 2 through 5.   bisoprolol-hydrochlorothiazide (ZIAC) 2.5-6.25 MG tablet TAKE 1 TABLET BY MOUTH EVERY DAY   calcium carbonate (TUMS EX) 750 MG chewable tablet Chew 1 tablet by mouth daily.   cholecalciferol (VITAMIN D) 25 MCG (1000 UNIT) tablet Take 1,000 Units by mouth daily.   clindamycin (CLEOCIN T) 1 % lotion Apply 1 application topically  every evening.   famotidine (PEPCID) 20 MG tablet Take 20 mg by mouth daily.   fluticasone (FLONASE) 50 MCG/ACT nasal spray Place 2 sprays into both nostrils daily.    fluticasone (FLOVENT HFA) 110 MCG/ACT inhaler Inhale into the lungs 2 (two) times daily.   hydrocortisone cream 1 % Apply 1 application topically daily as needed for itching.   ketotifen (ZADITOR) 0.025 % ophthalmic solution Uses prn   loratadine (CLARITIN) 10 MG tablet Take 10 mg by mouth daily.   Methylcobalamin (B12) 5000 MCG SUBL Place under the tongue.   montelukast (SINGULAIR) 10 MG tablet TAKE 1 TABLET BY MOUTH DAILY FOR ALLERGIES   Olopatadine HCl 0.2 % SOLN Place 1 drop into both eyes daily as needed. (Patient not taking: Reported on 11/02/2022)   promethazine-dextromethorphan (PROMETHAZINE-DM) 6.25-15 MG/5ML syrup Take 5 mLs by mouth 4 (four) times daily as needed for cough.   RABEprazole (ACIPHEX) 20 MG tablet Take 1 tablet (20 mg total) by mouth 2 (two) times daily.   sucralfate (CARAFATE) 1 g tablet TAKE 1 TABLET (1 G TOTAL) BY MOUTH 4 TIMES A DAY WITH MEALS AND AT BEDTIME   tamsulosin (FLOMAX) 0.4 MG CAPS capsule TAKE 1 CAPSULE AT BEDTIME FOR PROSTATE (Patient taking differently: Take 0.4 mg by mouth at bedtime.)   Testosterone (ANDROGEL PUMP) 20.25 MG/ACT (1.62%) GEL 2 pumps each arm daily, (4 pumps total)   triamcinolone cream (KENALOG) 0.1 % Apply 1 application topically 2 (two) times daily as needed for itching.   Zinc 50 MG TABS Take 1 tablet Daily   No current facility-administered medications on file prior to visit.    ROS: all negative except above.   Physical Exam:  BP 110/70   Pulse 60   Temp 98 F (36.7 C)   Resp 17   Wt 173 lb 6.4 oz (78.7 kg)   SpO2 98%   BMI 25.61 kg/m   General Appearance: Well nourished, in no apparent distress. Eyes: PERRLA, EOMs, conjunctiva no swelling or erythema Sinuses: No Frontal/maxillary tenderness ENT/Mouth: Ext aud canals clear, TMs without erythema,  bulging. No swelling or exudate on post pharynx.  Mild erythema of post pharynx, Hearing normal.  Neck: Supple, thyroid normal.  Respiratory: Respiratory effort normal, BS equal bilaterally without rales, rhonchi, wheezing or stridor.  Cardio: RRR with no MRGs. Brisk peripheral pulses without edema.  Abdomen: Soft, + BS.  Non tender, no guarding, rebound, hernias, masses. Lymphatics: Non tender without lymphadenopathy.  Musculoskeletal: Full ROM, 5/5 strength, normal gait.  Skin: Warm, dry without rashes, lesions, ecchymosis.  Neuro: Cranial nerves intact. Normal muscle tone, no cerebellar symptoms. Sensation intact.  Psych: Awake and oriented X 3, normal affect, Insight and Judgment appropriate.     Tylesha Gibeault  Mikki Santee, NP 11:15 AM [facility]

## 2022-12-19 ENCOUNTER — Ambulatory Visit (INDEPENDENT_AMBULATORY_CARE_PROVIDER_SITE_OTHER): Payer: Commercial Managed Care - PPO | Admitting: Nurse Practitioner

## 2022-12-19 ENCOUNTER — Encounter: Payer: Self-pay | Admitting: Nurse Practitioner

## 2022-12-19 VITALS — BP 122/78 | HR 64 | Temp 97.5°F | Ht 69.0 in | Wt 175.2 lb

## 2022-12-19 DIAGNOSIS — E782 Mixed hyperlipidemia: Secondary | ICD-10-CM | POA: Diagnosis not present

## 2022-12-19 DIAGNOSIS — K219 Gastro-esophageal reflux disease without esophagitis: Secondary | ICD-10-CM

## 2022-12-19 DIAGNOSIS — G4733 Obstructive sleep apnea (adult) (pediatric): Secondary | ICD-10-CM

## 2022-12-19 DIAGNOSIS — R7989 Other specified abnormal findings of blood chemistry: Secondary | ICD-10-CM

## 2022-12-19 DIAGNOSIS — F5104 Psychophysiologic insomnia: Secondary | ICD-10-CM

## 2022-12-19 DIAGNOSIS — R7309 Other abnormal glucose: Secondary | ICD-10-CM | POA: Diagnosis not present

## 2022-12-19 DIAGNOSIS — Z79899 Other long term (current) drug therapy: Secondary | ICD-10-CM

## 2022-12-19 DIAGNOSIS — I10 Essential (primary) hypertension: Secondary | ICD-10-CM | POA: Diagnosis not present

## 2022-12-19 DIAGNOSIS — J302 Other seasonal allergic rhinitis: Secondary | ICD-10-CM

## 2022-12-19 DIAGNOSIS — E559 Vitamin D deficiency, unspecified: Secondary | ICD-10-CM

## 2022-12-19 DIAGNOSIS — E663 Overweight: Secondary | ICD-10-CM | POA: Diagnosis not present

## 2022-12-19 DIAGNOSIS — E349 Endocrine disorder, unspecified: Secondary | ICD-10-CM

## 2022-12-19 NOTE — Patient Instructions (Signed)

## 2022-12-19 NOTE — Progress Notes (Signed)
FOLLOW UP  Assessment and Plan:   Hypertension Continue Bisoprolol-HCTZ  Discussed DASH (Dietary Approaches to Stop Hypertension) DASH diet is lower in sodium than a typical American diet. Cut back on foods that are high in saturated fat, cholesterol, and trans fats. Eat more whole-grain foods, fish, poultry, and nuts Remain active and exercise as tolerated daily.  Monitor BP at home-Call if greater than 130/80.  Check CMP/CBC  Hyperlipidemia At goal with lifestyle modifications - continue  Recommended diet heavy in fruits and veggies, omega 3's. Decrease consumption of animal meats, cheeses, and dairy products. Remain active and exercise as tolerated. Continue to monitor. Check lipids/TSH  Other abnormal glucose Education: Reviewed 'ABCs' of diabetes management  Discussed goals to be met and/or maintained include A1C (<7) Blood pressure (<130/80) Cholesterol (LDL <70) Continue Eye Exam yearly  Continue Dental Exam Q6 mo Discussed dietary recommendations Discussed Physical Activity recommendations Foot exam UTD Check A1C   BMI 25 Discussed appropriate BMI Diet modification. Physical activity. Encouraged/praised to build confidence.   Vitamin D Def Continue supplement for goal of 60-100 Monitor vitamin D levels  Testosterone deficiency Continue Zinc supplementation, testosterone, tamsulosin Monitor testosterone levels  Insomnia Preferring to avoid Rx meds at this time Currently on 1.25 mg Melatonin Discussed good sleep hygiene - complete in home sleep study by [facility]  Establish bed and wake times. Sleep restriction-only sleep estimated hrs sleep. Bed only for sex and sleep, only sleep when sleepy, out of bed if anxious (stimulus control). Reviewed relaxation techniques, mindful meditations. Expected sleep duration. Addressed worries about not sleeping.   OSA  Followed by Dr, Dohmeier Could not tolerate CPAP Underwent nasal septoplasty bilaterally  04/2021 In home sleep study scheduled for 12/20/22  Seasonal allergies Continue Montelukast, Claritin and inhalers Avoid triggers  GERD Continue sucralfate, rabeprazole No suspected reflux complications (Barret/stricture). Lifestyle modification:  wt loss, avoid meals 2-3h before bedtime. Consider eliminating food triggers:  chocolate, caffeine, EtOH, acid/spicy food.  Elevated TSH No diagnosis of hypothyroidism Monitor levels  Orders Placed This Encounter  Procedures   CBC with Differential/Platelet   COMPLETE METABOLIC PANEL WITH GFR   Lipid panel   TSH     Notify office for further evaluation and treatment, questions or concerns if any reported s/s fail to improve.   The patient was advised to call back or seek an in-person evaluation if any symptoms worsen or if the condition fails to improve as anticipated.   Further disposition pending results of labs. Discussed med's effects and SE's.    I discussed the assessment and treatment plan with the patient. The patient was provided an opportunity to ask questions and all were answered. The patient agreed with the plan and demonstrated an understanding of the instructions.  Discussed med's effects and SE's. Screening labs and tests as requested with regular follow-up as recommended.  I provided 20 minutes of face-to-face time during this encounter including counseling, chart review, and critical decision making was preformed.   Future Appointments  Date Time Provider [place]  12/21/2022 10:30 AM Mansouraty, Telford Nab., MD [facility]  03/21/2023  2:30 PM Unk Pinto, MD [facility] None  09/20/2023  2:00 PM Unk Pinto, MD [facility] None    ----------------------------------------------------------------------------------------------------------------------  HPI 62 y.o. male  presents for 3 month follow up on hypertension, cholesterol, glucose management, weight, hypogonadism and vitamin D  deficiency.   Overall he reports feeling well today.  He has no concerns to report at this time.  He is on Aciphex for  GERD but continues to have symptoms and also takes [place]. Is being evaluated by GI.   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 Bravo test per Dr Ardis Hughs did not reveal significant acid reflux and did not feel his cough was related to GERD. He also was seen by ENT, Dr Carol Ada w/o any ENT pathology identified. Allergy Evaluation by Dr Orvil Feil was likewise unrevealing. Patient reports PFT's per Dr Melvyn Novas did not reveal Asthma and empiric treatment with MDI's did not improve his cough.  Patient has been out of work per Dr Melvyn Novas & on Disability... since last Oct 2019 for Allergic Rhinitis & 'Upper Airway Cough Syndrome'..]] He is currently being evaluated by GI for persistent cough which occurs more when stomach is empty.   Patient has  Moderate OSA and is followed by Dr Brett Fairy and recommended Auto CPAP, however he was unable to tolerate. He has undergone bilateral nasal septoplasty and was fit with an oral device. He has undergone 3 separate sleep studies. He is undergoing adjustments with his oral device. He is not snoring when wearing the device.   He is now scheduled for an in home sleep study through his work/company from [facility]  Has this scheduled for 12/20/22.  He is taking Melatonin 1.25 mg with some effectiveness.  Now getting 5-7 hours of sleep instead of 4 hours.    He does use Ventolin rescue inhaler as needed and Flovent daily for cough. He does use claritin and Singulair daily for seasonal allergies.   BMI is Body mass index is 25.87 kg/m., he has been working on diet and exercise. Wt Readings from Last 3 Encounters:  12/19/22 175 lb 3.2 oz (79.5 kg)  11/25/22 173 lb 6.4 oz (78.7 kg)  11/02/22 171 lb (77.6 kg)    His blood pressure has been controlled at home running in 110-130/70's, today their BP is BP: 122/78 . He has stopped his Olmesartan as it was causing him to cough and was feeling light headed.  BP Readings from Last 3 Encounters:  12/19/22 122/78  11/25/22 110/70  11/02/22 118/80      He does not workout. He denies chest pain, shortness of breath, dizziness.   He is not on cholesterol medication. His cholesterol is not at goal. He has been  The cholesterol last visit was:   Lab Results  Component Value Date   CHOL 172 09/14/2022   HDL 48 09/14/2022   LDLCALC 99 09/14/2022   TRIG 156 (H) 09/14/2022   CHOLHDL 3.6 09/14/2022    He has not been working on diet and exercise for glucose management, and denies increased appetite, nausea, paresthesia of the feet, polydipsia and polyuria. Last A1C in the office was:  Lab Results  Component Value Date   HGBA1C 5.3 09/14/2022   Patient is on Vitamin D supplement but was at goal of 60-100:  Lab Results  Component Value Date   VD25OH 77 09/14/2022     He has a history of testosterone deficiency and he does take Zinc 50 mg daily Lab Results  Component Value Date   TESTOSTERONE 263 09/14/2022     Current Medications:  Current Outpatient Medications on File Prior to Visit  Medication Sig   albuterol (VENTOLIN HFA) 108 (90 Base) MCG/ACT inhaler 2 puffs   Azelastine HCl 137 MCG/SPRAY SOLN SMARTSIG:2 Spray(s)  Both Nares Every Evening   bisoprolol-hydrochlorothiazide (ZIAC) 2.5-6.25 MG tablet TAKE 1 TABLET BY MOUTH EVERY DAY   calcium carbonate (TUMS EX) 750 MG chewable tablet Chew 1 tablet by mouth daily.   cholecalciferol (VITAMIN D) 25 MCG (1000 UNIT) tablet Take 1,000 Units by mouth daily.   clindamycin (CLEOCIN T) 1 % lotion Apply 1 application topically every evening.   famotidine (PEPCID) 20 MG tablet Take 20 mg by mouth daily.   fluticasone (FLONASE) 50 MCG/ACT nasal spray Place 2 sprays into both nostrils daily.    fluticasone  (FLOVENT HFA) 110 MCG/ACT inhaler Inhale into the lungs 2 (two) times daily.   hydrocortisone cream 1 % Apply 1 application topically daily as needed for itching.   ketotifen (ZADITOR) 0.025 % ophthalmic solution Uses prn   loratadine (CLARITIN) 10 MG tablet Take 10 mg by mouth daily.   Methylcobalamin (B12) 5000 MCG SUBL Place under the tongue.   montelukast (SINGULAIR) 10 MG tablet TAKE 1 TABLET BY MOUTH DAILY FOR ALLERGIES   Olopatadine HCl 0.2 % SOLN Place 1 drop into both eyes daily as needed.   RABEprazole (ACIPHEX) 20 MG tablet Take 1 tablet (20 mg total) by mouth 2 (two) times daily.   sucralfate (CARAFATE) 1 g tablet TAKE 1 TABLET (1 G TOTAL) BY MOUTH 4 TIMES A DAY WITH MEALS AND AT BEDTIME   triamcinolone cream (KENALOG) 0.1 % Apply 1 application topically 2 (two) times daily as needed for itching.   Vibegron (GEMTESA) 75 MG TABS Take by mouth daily.   azithromycin (ZITHROMAX) 250 MG tablet Take 2 tablets (500 mg) on  Day 1,  followed by 1 tablet (250 mg) once daily on Days 2 through 5.   benzonatate (TESSALON PERLES) 100 MG capsule Take 1 capsule (100 mg total) by mouth every 6 (six) hours as needed for cough. (Patient not taking: Reported on 12/19/2022)   promethazine-dextromethorphan (PROMETHAZINE-DM) 6.25-15 MG/5ML syrup Take 5 mLs by mouth 4 (four) times daily as needed for cough. (Patient not taking: Reported on 11/25/2022)   tamsulosin (FLOMAX) 0.4 MG CAPS capsule TAKE 1 CAPSULE AT BEDTIME FOR PROSTATE (Patient not taking: Reported on 12/19/2022)   Testosterone (ANDROGEL PUMP) 20.25 MG/ACT (1.62%) GEL 2 pumps each arm daily, (4 pumps total) (Patient not taking: Reported on 12/19/2022)   Zinc 50 MG TABS Take 1 tablet Daily (Patient not taking: Reported on 12/19/2022)   No current facility-administered medications on file prior to visit.     Allergies:  Allergies  Allergen Reactions   Allegra [Fexofenadine] Other (See Comments)    Worsened insomnia   Benzoyl Peroxide      blisters   Decadron [Dexamethasone]     Reflux & Hiccups, GI upset   Doxycycline Nausea Only   Levaquin [Levofloxacin] Nausea And Vomiting    Patient states that after he took levaquin this past time he had nausea with vomiting     Medical History:  Past Medical History:  Diagnosis Date   Allergy    Chronic cough    Chronic sinusitis    Diverticulitis 2018   Mild   Diverticulosis of colon (without mention of hemorrhage)    Early satiety    Esophageal motility disorder 12/2018   Family history of adverse reaction to anesthesia    mom has PONV   GERD (gastroesophageal reflux disease)    History of kidney stones    2017   History of left inguinal hernia    Hypertension    Insomnia  Lyme disease    QUESTIONABLE   Pulmonary nodules 10/2018   Scattered tiny subpleural pulmonary nodules at the peripheral right lung base, largest 3 mm   Right inguinal hernia 2017   Small fat containing right inguinal hernia   Sleep apnea    no CPAP   Sleep deficient    Testosterone deficiency    Vitamin D deficiency    Wears glasses    Family history- Reviewed and unchanged Social history- Reviewed and unchanged   Review of Systems:  Review of Systems  Constitutional:  Negative for malaise/fatigue and weight loss.  HENT:  Negative for hearing loss and tinnitus.   Eyes:  Negative for blurred vision and double vision.  Respiratory:  Positive for cough (chronic). Negative for hemoptysis, sputum production, shortness of breath and wheezing.   Cardiovascular:  Negative for chest pain, palpitations, orthopnea, claudication and leg swelling.  Gastrointestinal:  Negative for abdominal pain, blood in stool, constipation, diarrhea, heartburn, melena, nausea and vomiting.  Genitourinary: Negative.   Musculoskeletal:  Negative for joint pain and myalgias.  Skin:  Negative for rash.  Neurological:  Positive for dizziness. Negative for tingling, sensory change, weakness and headaches.   Endo/Heme/Allergies:  Negative for polydipsia.  Psychiatric/Behavioral:  Negative for depression, hallucinations, memory loss, substance abuse and suicidal ideas. The patient has insomnia. The patient is not nervous/anxious.   All other systems reviewed and are negative.   Physical Exam: BP 122/78   Pulse 64   Temp (!) 97.5 F (36.4 C)   Ht 5' 9"$  (1.753 m)   Wt 175 lb 3.2 oz (79.5 kg)   SpO2 98%   BMI 25.87 kg/m  Wt Readings from Last 3 Encounters:  12/19/22 175 lb 3.2 oz (79.5 kg)  11/25/22 173 lb 6.4 oz (78.7 kg)  11/02/22 171 lb (77.6 kg)   General Appearance: Well nourished, in no apparent distress. Eyes: PERRLA, EOMs, conjunctiva no swelling or erythema Sinuses: No Frontal/maxillary tenderness ENT/Mouth: Ext aud canals clear, TMs without erythema, bulging. No erythema, swelling, or exudate on post pharynx.  Tonsils not swollen or erythematous. Hearing normal.  Neck: Supple, thyroid normal.  Respiratory: Respiratory effort normal, BS equal bilaterally without rales, rhonchi, wheezing or stridor. Does have dry cough Cardio: RRR with no MRGs. Brisk peripheral pulses without edema.  Abdomen: Soft, + BS.  Non tender, no guarding, rebound, hernias, masses. Lymphatics: Non tender without lymphadenopathy.  Musculoskeletal: Full ROM, 5/5 strength, Normal gait Skin: Warm, dry without rashes, lesions, ecchymosis.  Neuro: Cranial nerves intact. No cerebellar symptoms.  Psych: Awake and oriented X 3, normal affect, Insight and Judgment appropriate.    Darrol Jump, NP 3:17 PM [facility]

## 2022-12-20 ENCOUNTER — Encounter: Payer: Self-pay | Admitting: Nurse Practitioner

## 2022-12-20 ENCOUNTER — Ambulatory Visit: Payer: Commercial Managed Care - PPO | Admitting: Adult Health

## 2022-12-20 LAB — LIPID PANEL
Cholesterol: 168 mg/dL (ref <200)
HDL: 41 mg/dL (ref >=40)
LDL Cholesterol (Calc): 92 mg/dL (calc)
Non-HDL Cholesterol (Calc): 127 mg/dL (calc) (ref <130)
Total CHOL/HDL Ratio: 4.1 (calc) (ref <5.0)
Triglycerides: 268 mg/dL — ABNORMAL HIGH (ref <150)

## 2022-12-20 LAB — CBC WITH DIFFERENTIAL/PLATELET
Absolute Monocytes: 696 cells/uL (ref 200–950)
Basophils Absolute: 29 cells/uL (ref 0–200)
Basophils Relative: 0.5 %
Eosinophils Absolute: 70 cells/uL (ref 15–500)
Eosinophils Relative: 1.2 %
HCT: 41.8 % (ref 38.5–50.0)
Hemoglobin: 14.8 g/dL (ref 13.2–17.1)
Lymphs Abs: 1572 cells/uL (ref 850–3900)
MCH: 32.2 pg (ref 27.0–33.0)
MCHC: 35.4 g/dL (ref 32.0–36.0)
MCV: 90.9 fL (ref 80.0–100.0)
MPV: 9.7 fL (ref 7.5–12.5)
Monocytes Relative: 12 %
Neutro Abs: 3434 cells/uL (ref 1500–7800)
Neutrophils Relative %: 59.2 %
Platelets: 203 10*3/uL (ref 140–400)
RBC: 4.6 10*6/uL (ref 4.20–5.80)
RDW: 12.9 % (ref 11.0–15.0)
Total Lymphocyte: 27.1 %
WBC: 5.8 10*3/uL (ref 3.8–10.8)

## 2022-12-20 LAB — COMPLETE METABOLIC PANEL WITH GFR
AG Ratio: 1.4 (calc) (ref 1.0–2.5)
ALT: 29 U/L (ref 9–46)
AST: 32 U/L (ref 10–35)
Albumin: 4.4 g/dL (ref 3.6–5.1)
Alkaline phosphatase (APISO): 63 U/L (ref 35–144)
BUN: 19 mg/dL (ref 7–25)
CO2: 25 mmol/L (ref 20–32)
Calcium: 9.2 mg/dL (ref 8.6–10.3)
Chloride: 104 mmol/L (ref 98–110)
Creat: 1.01 mg/dL (ref 0.70–1.35)
Globulin: 3.1 g/dL (calc) (ref 1.9–3.7)
Glucose, Bld: 83 mg/dL (ref 65–99)
Potassium: 4 mmol/L (ref 3.5–5.3)
Sodium: 140 mmol/L (ref 135–146)
Total Bilirubin: 0.5 mg/dL (ref 0.2–1.2)
Total Protein: 7.5 g/dL (ref 6.1–8.1)
eGFR: 84 mL/min/{1.73_m2} (ref >=60)

## 2022-12-20 LAB — TSH: TSH: 4.02 mIU/L (ref 0.40–4.50)

## 2022-12-21 ENCOUNTER — Ambulatory Visit: Payer: Commercial Managed Care - PPO | Admitting: Gastroenterology

## 2022-12-21 ENCOUNTER — Encounter: Payer: Self-pay | Admitting: Gastroenterology

## 2022-12-21 VITALS — BP 120/68 | HR 55 | Ht 69.0 in | Wt 172.0 lb

## 2022-12-21 DIAGNOSIS — K219 Gastro-esophageal reflux disease without esophagitis: Secondary | ICD-10-CM | POA: Diagnosis not present

## 2022-12-21 DIAGNOSIS — K648 Other hemorrhoids: Secondary | ICD-10-CM

## 2022-12-21 NOTE — Patient Instructions (Addendum)
Send my chart message in 6-8 weeks to let us know if you would like to proceed with hemorrhoid banding. Please see handout.   Try to decrease your Carafate to twice daily.   Crush 1 tablet and dissolve in 26m of warm water, mix well to create slurry and drink twice daily.   Follow up in 3 months- office to contact you to schedule.   _______________________________________________________  If your blood pressure at your visit was 140/90 or greater, please contact your primary care physician to follow up on this.  _______________________________________________________  If you are age 4280or older, your body mass index should be between 23-30. Your Body mass index is 25.4 kg/m. If this is out of the aforementioned range listed, please consider follow up with your Primary Care Provider.  If you are age 4210or younger, your body mass index should be between 19-25. Your Body mass index is 25.4 kg/m. If this is out of the aformentioned range listed, please consider follow up with your Primary Care Provider.   ________________________________________________________  The [facility] providers would like to encourage you to use [facility] communicate with providers for non-urgent requests or questions.  Due to long hold times on the telephone, sending your provider a message by [facility] be a faster and more efficient way to get a response.  Please allow 48 business hours for a response.  Please remember that this is for non-urgent requests.  _______________________________________________________  Thank you for choosing me and [facility].  Dr. MRush Landmark

## 2022-12-21 NOTE — Progress Notes (Unsigned)
[place] VISIT   Primary Care Provider Unk Pinto, MD [address] [place] [address] [phone]  Patient Profile: Jay Brooks is a 62 y.o. male with a pmh significant for hypertension, nephrolithiasis, OSA, chronic cough, GERD (though Bravo testing suggested less likely true acid related), diverticulosis, hemorrhoids.  The patient presents to the [facility] for an evaluation and management of problem(s) noted below:  Problem List No diagnosis found.   History of Present Illness Please see prior notes for full details of HPI.  Interval History This is a patient who has followed with Dr. Ardis Hughs and with NP [facility] over the course of the last few years.  The patient has been dealing with increasing issues of rectal itching over the last few months.  He is using equate suppositories for a period of 3 times daily and then to twice daily and then stopped and then has had to restart them because of the significant rectal itching.  He is used preparation H for an extensive period of time nightly.  Tucks pads have helped as well as lidocaine jelly at times.  He has not noted any significant bleeding however.  He has never tried steroid Anusol suppositories.  He is hopeful that something can make a difference for this.  In addition, the patient describes getting episodes of nausea as well as upset stomach on a regular daily basis.  He continues to take his PPI which she has been on for years but he also takes 5-8 times per day to try and settle his stomach.  It is more of a burning discomfort that occurs before eating and is more of a hunger pain.  After he eats this does improve his issues.  He will take his Tums normally in the morning and hopefully not have to take again until evening time.  His weight has been increasing.  Review of his previous Bravo testing from a few years ago had actually suggested that the  patient did not have true overt acid reflux based on DeMeester scoring (however he was taking PPI per his report).  He has noticed some increasing belching issues.  He is wondering if things will ever get better.  His chronic cough remains an issue even with his PPI is Marcello Moores and his Zantac and his other medications.  GI Review of Systems Positive as above Negative for dysphagia, odynophagia, alteration of bowel habits, melena, hematochezia  Review of Systems General: Denies fevers/chills/weight loss unintentionally Cardiovascular: Denies chest pain Pulmonary: Denies shortness of breath Gastroenterological: See HPI Genitourinary: Denies darkened urine Hematological: Denies easy bruising/bleeding Dermatological: Denies jaundice Psychological: Mood is stable   Medications Current Outpatient Medications  Medication Sig Dispense Refill   albuterol (VENTOLIN HFA) 108 (90 Base) MCG/ACT inhaler 2 puffs     Azelastine HCl 137 MCG/SPRAY SOLN SMARTSIG:2 Spray(s) Both Nares Every Evening     bisoprolol-hydrochlorothiazide (ZIAC) 2.5-6.25 MG tablet TAKE 1 TABLET BY MOUTH EVERY DAY 30 tablet 2   calcium carbonate (TUMS EX) 750 MG chewable tablet Chew 1 tablet by mouth daily.     cholecalciferol (VITAMIN D) 25 MCG (1000 UNIT) tablet Take 1,000 Units by mouth daily.     clindamycin (CLEOCIN T) 1 % lotion Apply 1 application topically every evening.     famotidine (PEPCID) 20 MG tablet Take 20 mg by mouth daily.     fluticasone (FLONASE) 50 MCG/ACT nasal spray Place 2 sprays into both nostrils daily.  fluticasone (FLOVENT HFA) 110 MCG/ACT inhaler Inhale into the lungs 2 (two) times daily.     hydrocortisone cream 1 % Apply 1 application topically daily as needed for itching.     ketotifen (ZADITOR) 0.025 % ophthalmic solution Uses prn 5 mL 0   loratadine (CLARITIN) 10 MG tablet Take 10 mg by mouth daily.     Methylcobalamin (B12) 5000 MCG SUBL Place under the tongue.     montelukast (SINGULAIR)  10 MG tablet TAKE 1 TABLET BY MOUTH DAILY FOR ALLERGIES 30 tablet 3   Olopatadine HCl 0.2 % SOLN Place 1 drop into both eyes daily as needed.     RABEprazole (ACIPHEX) 20 MG tablet Take 1 tablet (20 mg total) by mouth 2 (two) times daily. 180 tablet 3   sucralfate (CARAFATE) 1 g tablet TAKE 1 TABLET (1 G TOTAL) BY MOUTH 4 TIMES A DAY WITH MEALS AND AT BEDTIME 90 tablet 0   tamsulosin (FLOMAX) 0.4 MG CAPS capsule TAKE 1 CAPSULE AT BEDTIME FOR PROSTATE 90 capsule 2   Testosterone (ANDROGEL PUMP) 20.25 MG/ACT (1.62%) GEL 2 pumps each arm daily, (4 pumps total) 75 g 4   triamcinolone cream (KENALOG) 0.1 % Apply 1 application topically 2 (two) times daily as needed for itching.     Vibegron (GEMTESA) 75 MG TABS Take by mouth daily.     benzonatate (TESSALON PERLES) 100 MG capsule Take 1 capsule (100 mg total) by mouth every 6 (six) hours as needed for cough. (Patient not taking: Reported on 12/19/2022) 30 capsule 1   promethazine-dextromethorphan (PROMETHAZINE-DM) 6.25-15 MG/5ML syrup Take 5 mLs by mouth 4 (four) times daily as needed for cough. (Patient not taking: Reported on 11/25/2022) 240 mL 1   Zinc 50 MG TABS Take 1 tablet Daily (Patient not taking: Reported on 12/19/2022)  0   No current facility-administered medications for this visit.    Allergies Allergies  Allergen Reactions   Allegra [Fexofenadine] Other (See Comments)    Worsened insomnia   Benzoyl Peroxide     blisters   Decadron [Dexamethasone]     Reflux & Hiccups, GI upset   Doxycycline Nausea Only   Levaquin [Levofloxacin] Nausea And Vomiting    Patient states that after he took levaquin this past time he had nausea with vomiting    Histories Past Medical History:  Diagnosis Date   Allergy    Chronic cough    Chronic sinusitis    Diverticulitis 2018   Mild   Diverticulosis of colon (without mention of hemorrhage)    Early satiety    Esophageal motility disorder 12/2018   Family history of adverse reaction to  anesthesia    mom has PONV   GERD (gastroesophageal reflux disease)    History of kidney stones    2017   History of left inguinal hernia    Hypertension    Insomnia    Lyme disease    QUESTIONABLE   Pulmonary nodules 10/2018   Scattered tiny subpleural pulmonary nodules at the peripheral right lung base, largest 3 mm   Right inguinal hernia 2017   Small fat containing right inguinal hernia   Sleep apnea    no CPAP   Sleep deficient    Testosterone deficiency    Vitamin D deficiency    Wears glasses    Past Surgical History:  Procedure Laterality Date   BRAVO [place] STUDY N/A 05/02/2019   Procedure: BRAVO [place] STUDY;  Surgeon: Milus Banister, MD;  Location: [facility];  Service: Endoscopy;  Laterality: N/A;   COLONOSCOPY  02/2011   ESOPHAGOGASTRODUODENOSCOPY (EGD) WITH PROPOFOL N/A 05/02/2019   Procedure: ESOPHAGOGASTRODUODENOSCOPY (EGD) WITH PROPOFOL;  Surgeon: Milus Banister, MD;  Location: [facility];  Service: Endoscopy;  Laterality: N/A;   HERNIA REPAIR     NASAL SEPTOPLASTY W/ TURBINOPLASTY Bilateral 04/21/2021   Procedure: NASAL SEPTOPLASTY WITH  BILATERAL TURBINATE REDUCTION;  Surgeon: Leta Baptist, MD;  Location: [facility] OR;  Service: ENT;  Laterality: Bilateral;   TOE SURGERY     INGROWN TOE NAIL, CHILDHOOD   UPPER GASTROINTESTINAL ENDOSCOPY     VASECTOMY     Social History   Socioeconomic History   Marital status: Married    Spouse name: Not on file   Number of children: 1   Years of education: Not on file   Highest education level: Not on file  Occupational History   Occupation: PILOT    Employer: [employer]  Tobacco Use   Smoking status: Never   Smokeless tobacco: Never  Vaping Use   Vaping Use: Never used  Substance and Sexual Activity   Alcohol use: Yes    Alcohol/week: 2.0 - 3.0 standard drinks of alcohol    Types: 2 - 3 Cans of beer per week   Drug use: No   Sexual activity: Not on file  Other Topics Concern   Not on file  Social History  Narrative   Daily caffeine    Social Determinants of Health   Financial Resource Strain: Not on file  Food Insecurity: Not on file  Transportation Needs: Not on file  Physical Activity: Not on file  Stress: Not on file  Social Connections: Not on file  Intimate Partner Violence: Not on file   Family History  Problem Relation Age of Onset   Cancer Mother 55       pancreatic   Colon polyps Brother    Colon polyps Maternal Uncle    Breast cancer Maternal Grandmother    Colon cancer Neg Hx    Esophageal cancer Neg Hx    Stomach cancer Neg Hx    Ulcerative colitis Neg Hx    Inflammatory bowel disease Neg Hx    Liver disease Neg Hx    Pancreatic cancer Neg Hx    Rectal cancer Neg Hx    I have reviewed his medical, social, and family history in detail and updated the electronic medical record as necessary.    PHYSICAL EXAMINATION  BP 120/68   Pulse (!) 55   Ht 5' 9"$  (1.753 m)   Wt 172 lb (78 kg)   BMI 25.40 kg/m  Wt Readings from Last 3 Encounters:  12/21/22 172 lb (78 kg)  12/19/22 175 lb 3.2 oz (79.5 kg)  11/25/22 173 lb 6.4 oz (78.7 kg)  GEN: NAD, appears stated age, doesn't appear chronically ill PSYCH: Cooperative, without pressured speech EYE: Conjunctivae pink, sclerae anicteric ENT: MMM CV: Nontachycardic RESP: No audible wheezing GI: NABS, soft, protuberant abdomen, NT/ND, without rebound or guarding GU: DRE shows evidence of small external skin tag in, internal hemorrhoids palpated with small brown stool in the vault and normal perineal descent MSK/EXT: No lower extremity edema SKIN: No jaundice NEURO:  Alert & Oriented x 3, no focal deficits   REVIEW OF DATA  I reviewed the following data at the time of this encounter:  GI Procedures and Studies  2022 Colonoscopy - Diverticulosis in the left colon. - Small internal hemorrhoids. - The examination was otherwise normal on direct  and retroflexion views. - No polyps or cancers.  2023 EGD - Mild,  non-specific distal gastritis. Biopsied to check for H. pylori. - A few small (4-54m) soft, fleshy, classic appearing fundic gland polyps in the proximal stomach. - The examination was otherwise normal.  Pathology Diagnosis Surgical [P], gastric - GASTRIC MUCOSA WITH CHANGES OF REACTIVE GASTROPATHY. - NO HELICOBACTER PYLORI IDENTIFIED.  Laboratory Studies  Reviewed those in epic  Imaging Studies  2020 Barium Swallow IMPRESSION: Disruption of primary peristalsis on some swallows consistent with nonspecific esophageal motility disorder. Otherwise unremarkable.   ASSESSMENT  Mr. PHisleis a 62y.o. male with a pmh significant for hypertension, nephrolithiasis, OSA, chronic cough, GERD (though Bravo testing suggested less likely true acid related), diverticulosis, hemorrhoids.  The patient is seen today for evaluation and management of:  No diagnosis found.  The patient is hemodynamically stable.  From a GI perspective, he seems to be experiencing significant issues with his internal hemorrhoids.  I think it is reasonable for uKoreato try true Anusol suppositories to see if that may be helpful for him.  Even though rectal itching in and of itself is not a typical indication for absolute need for hemorrhoidal banding, it seems that he has had some improvement with the therapy that would be required for uKoreato see if the hemorrhoids are actually the issue at play.  We will see how he does with Anusol suppositories and addition of Calmol-4 to his regimen.  Depending on how he is doing in follow-up, we will consider referral to one of my colleagues who can consider hemorrhoidal banding.  In regards to the patient's epigastric discomfort and "hunger pains" is not completely defined by me as of yet what is going on here.  He had more likely signs of functional heartburn based on his Bravo testing but pyrosis symptoms are not present currently this is more epigastric pain.  We are going to try to add  Carafate for a period of time for the next few weeks to see how he does.  We may consider transition of his PPI for a one-time change to see if that makes any difference.  If at follow-up he is still experiencing issues we will plan to repeat at least an abdominal ultrasound and consider cross-sectional imaging as it has been years since he has had any imaging.  Will do some blood work as well if things persist.  All patient questions were answered to the best of my ability, and the patient agrees to the aforementioned plan of action with follow-up as indicated.   PLAN  Continue stool softeners Continue fiber supplementation May use Preparation H as needed Alternate equate/Calmol-4 suppositories In a few weeks if still having issues we can send in Anusol suppositories Hemorrhoidal information given Initiate Carafate 2-4 times daily to see if that makes any difference in regards to symptoms and decrease Tums usage May consider PPI transition Keep in mind Bravo findings more consistent with functional heartburn so may need a TCA at some point Abdominal ultrasound to be considered for CT cross-sectional imaging if abdominal discomfort persists at follow-up   No orders of the defined types were placed in this encounter.   New Prescriptions   No medications on file   Modified Medications   No medications on file    Planned Follow Up No follow-ups on file.   Total Time in Face-to-Face and in Coordination of Care for patient including independent/personal interpretation/review of prior testing, medical history, examination, medication  adjustment, communicating results with the patient directly, and documentation within the EHR is 30 minutes.   Justice Britain, MD [facility] # [id number]

## 2022-12-25 ENCOUNTER — Encounter: Payer: Self-pay | Admitting: Gastroenterology

## 2023-01-25 ENCOUNTER — Ambulatory Visit (INDEPENDENT_AMBULATORY_CARE_PROVIDER_SITE_OTHER): Payer: Commercial Managed Care - PPO | Admitting: Nurse Practitioner

## 2023-01-25 ENCOUNTER — Encounter: Payer: Self-pay | Admitting: Nurse Practitioner

## 2023-01-25 VITALS — BP 118/76 | HR 64 | Temp 97.9°F | Ht 69.0 in | Wt 174.2 lb

## 2023-01-25 DIAGNOSIS — K5792 Diverticulitis of intestine, part unspecified, without perforation or abscess without bleeding: Secondary | ICD-10-CM | POA: Diagnosis not present

## 2023-01-25 DIAGNOSIS — R6883 Chills (without fever): Secondary | ICD-10-CM | POA: Diagnosis not present

## 2023-01-25 LAB — CBC WITH DIFFERENTIAL/PLATELET
Absolute Monocytes: 1070 cells/uL — ABNORMAL HIGH (ref 200–950)
Basophils Absolute: 23 cells/uL (ref 0–200)
Basophils Relative: 0.3 %
Eosinophils Absolute: 46 cells/uL (ref 15–500)
Eosinophils Relative: 0.6 %
HCT: 40.9 % (ref 38.5–50.0)
Hemoglobin: 14 g/dL (ref 13.2–17.1)
Lymphs Abs: 1109 cells/uL (ref 850–3900)
MCH: 31.5 pg (ref 27.0–33.0)
MCHC: 34.2 g/dL (ref 32.0–36.0)
MCV: 91.9 fL (ref 80.0–100.0)
MPV: 9.8 fL (ref 7.5–12.5)
Monocytes Relative: 13.9 %
Neutro Abs: 5452 cells/uL (ref 1500–7800)
Neutrophils Relative %: 70.8 %
Platelets: 166 10*3/uL (ref 140–400)
RBC: 4.45 10*6/uL (ref 4.20–5.80)
RDW: 12.7 % (ref 11.0–15.0)
Total Lymphocyte: 14.4 %
WBC: 7.7 10*3/uL (ref 3.8–10.8)

## 2023-01-25 MED ORDER — METRONIDAZOLE 500 MG PO TABS: 500.0000 mg | ORAL_TABLET | Freq: Four times a day (QID) | ORAL | 0 refills | Status: DC

## 2023-01-25 MED ORDER — AMOXICILLIN-POT CLAVULANATE 875-125 MG PO TABS: 1.0000 | ORAL_TABLET | Freq: Two times a day (BID) | ORAL | 0 refills | Status: DC

## 2023-01-25 NOTE — Progress Notes (Signed)
Assessment and Plan:  Jay Brooks was seen today for an episodic visit.  Diagnoses and all order for this visit:  Diverticulitis/Chills Continue to monitor for increase in N/V, abdominal pain, fever, chills, blood in stool. May take OTC Tylenol as directed as needed for pain or fever. Clear liquid diet/rest/push fluids Contact office or report to ER if s/s fail to improve.  - CBC with Differential/Platelet - amoxicillin-clavulanate (AUGMENTIN) 875-125 MG tablet; Take 1 tablet by mouth 2 (two) times daily. 7 days  Dispense: 14 tablet; Refill: 0 - metroNIDAZOLE (FLAGYL) 500 MG tablet; Take 1 tablet (500 mg total) by mouth 4 (four) times daily.  Dispense: 28 tablet; Refill: 0   Notify office for further evaluation and treatment, questions or concerns if s/s fail to improve. The risks and benefits of my recommendations, as well as other treatment options were discussed with the patient today. Questions were answered.  Further disposition pending results of labs. Discussed med's effects and SE's.    Over 15 minutes of exam, counseling, chart review, and critical decision making was performed.   Future Appointments  Date Time Provider [place]  03/21/2023  2:30 PM Unk Pinto, MD [facility] None  09/20/2023  2:00 PM Unk Pinto, MD [facility] None    ------------------------------------------------------------------------------------------------------------------   HPI BP 118/76   Pulse 64   Temp 97.9 F (36.6 C)   Ht 5\' 9"  (1.753 m)   Wt 174 lb 3.2 oz (79 kg)   SpO2 99%   BMI 25.72 kg/m    Patient complains of diverticular disease. Diverticular disease has been diagnosed by colonoscopy. The patient has had no prior episodes since 2018. Symptoms have been present for approximately 3 days. The symptoms are unchanged. Patient has not required hospitalization for diverticulitis. Patient has not required abscess drainage. Patient has not required surgery with  end colostomy.  The patient admits to abdominal pain, located in the epigastrium, in the RLQ, in the LLQ, and in the left flank. The pain is described as aching, burning, colicky, cramping, hot, sharp, shooting, and stabbing, and is 5/10 in intensity.  Past Medical History:  Diagnosis Date   Allergy    Chronic cough    Chronic sinusitis    Diverticulitis 2018   Mild   Diverticulosis of colon (without mention of hemorrhage)    Early satiety    Esophageal motility disorder 12/2018   Family history of adverse reaction to anesthesia    mom has PONV   GERD (gastroesophageal reflux disease)    History of kidney stones    2017   History of left inguinal hernia    Hypertension    Insomnia    Lyme disease    QUESTIONABLE   Pulmonary nodules 10/2018   Scattered tiny subpleural pulmonary nodules at the peripheral right lung base, largest 3 mm   Right inguinal hernia 2017   Small fat containing right inguinal hernia   Sleep apnea    no CPAP   Sleep deficient    Testosterone deficiency    Vitamin D deficiency    Wears glasses      Allergies  Allergen Reactions   Allegra [Fexofenadine] Other (See Comments)    Worsened insomnia   Benzoyl Peroxide     blisters   Decadron [Dexamethasone]     Reflux & Hiccups, GI upset   Doxycycline Nausea Only   Levaquin [Levofloxacin] Nausea And Vomiting    Patient states that after he took levaquin this past time he had nausea with  vomiting    Current Outpatient Medications on File Prior to Visit  Medication Sig   albuterol (VENTOLIN HFA) 108 (90 Base) MCG/ACT inhaler 2 puffs   Azelastine HCl 137 MCG/SPRAY SOLN SMARTSIG:2 Spray(s) Both Nares Every Evening   bisoprolol-hydrochlorothiazide (ZIAC) 2.5-6.25 MG tablet TAKE 1 TABLET BY MOUTH EVERY DAY   calcium carbonate (TUMS EX) 750 MG chewable tablet Chew 1 tablet by mouth daily.   cholecalciferol (VITAMIN D) 25 MCG (1000 UNIT) tablet Take 1,000 Units by mouth daily.   clindamycin (CLEOCIN T) 1 %  lotion Apply 1 application topically every evening.   famotidine (PEPCID) 20 MG tablet Take 20 mg by mouth daily.   fluticasone (FLONASE) 50 MCG/ACT nasal spray Place 2 sprays into both nostrils daily.    fluticasone (FLOVENT HFA) 110 MCG/ACT inhaler Inhale into the lungs 2 (two) times daily.   hydrocortisone cream 1 % Apply 1 application topically daily as needed for itching.   loratadine (CLARITIN) 10 MG tablet Take 10 mg by mouth daily.   montelukast (SINGULAIR) 10 MG tablet TAKE 1 TABLET BY MOUTH DAILY FOR ALLERGIES   Olopatadine HCl 0.2 % SOLN Place 1 drop into both eyes daily as needed.   RABEprazole (ACIPHEX) 20 MG tablet Take 1 tablet (20 mg total) by mouth 2 (two) times daily.   tamsulosin (FLOMAX) 0.4 MG CAPS capsule TAKE 1 CAPSULE AT BEDTIME FOR PROSTATE   triamcinolone cream (KENALOG) 0.1 % Apply 1 application topically 2 (two) times daily as needed for itching.   Zinc 50 MG TABS Take 1 tablet Daily   benzonatate (TESSALON PERLES) 100 MG capsule Take 1 capsule (100 mg total) by mouth every 6 (six) hours as needed for cough. (Patient not taking: Reported on 01/25/2023)   ketotifen (ZADITOR) 0.025 % ophthalmic solution Uses prn (Patient not taking: Reported on 01/25/2023)   Methylcobalamin (B12) 5000 MCG SUBL Place under the tongue. (Patient not taking: Reported on 01/25/2023)   promethazine-dextromethorphan (PROMETHAZINE-DM) 6.25-15 MG/5ML syrup Take 5 mLs by mouth 4 (four) times daily as needed for cough. (Patient not taking: Reported on 01/25/2023)   sucralfate (CARAFATE) 1 g tablet TAKE 1 TABLET (1 G TOTAL) BY MOUTH 4 TIMES A DAY WITH MEALS AND AT BEDTIME (Patient not taking: Reported on 01/25/2023)   Testosterone (ANDROGEL PUMP) 20.25 MG/ACT (1.62%) GEL 2 pumps each arm daily, (4 pumps total) (Patient not taking: Reported on 01/25/2023)   Vibegron (GEMTESA) 75 MG TABS Take by mouth daily. (Patient not taking: Reported on 01/25/2023)   No current facility-administered medications on file  prior to visit.    ROS: all negative except what is noted in the HPI.   Physical Exam:  BP 118/76   Pulse 64   Temp 97.9 F (36.6 C)   Ht 5\' 9"  (1.753 m)   Wt 174 lb 3.2 oz (79 kg)   SpO2 99%   BMI 25.72 kg/m   General Appearance: NAD.  Awake, conversant and cooperative. Eyes: PERRLA, EOMs intact.  Sclera white.  Conjunctiva without erythema. Sinuses: No frontal/maxillary tenderness.  No nasal discharge. Nares patent.  ENT/Mouth: Ext aud canals clear.  Bilateral TMs w/DOL and without erythema or bulging. Hearing intact.  Posterior pharynx without swelling or exudate.  Tonsils without swelling or erythema.  Neck: Supple.  No masses, nodules or thyromegaly. Respiratory: Effort is regular with non-labored breathing. Breath sounds are equal bilaterally without rales, rhonchi, wheezing or stridor.  Cardio: RRR with no MRGs. Brisk peripheral pulses without edema.  Abdomen: Hyperactive BS in all  four quadrants.  Soft and tender to LLQ. No hernias or masses. Lymphatics: Non tender without lymphadenopathy.  Musculoskeletal: Full ROM, 5/5 strength, normal ambulation.  No clubbing or cyanosis. Skin: Appropriate color for ethnicity. Warm without rashes, lesions, ecchymosis, ulcers.  Neuro: CN II-XII grossly normal. Normal muscle tone without cerebellar symptoms and intact sensation.   Psych: AO X 3,  appropriate mood and affect, insight and judgment.     Darrol Jump, NP 12:10 PM [facility]

## 2023-01-25 NOTE — Patient Instructions (Signed)
Diverticulitis  Diverticulitis is when small pouches in your colon get infected or swollen. This causes pain in your belly (abdomen) and watery poop (diarrhea). The small pouches are called diverticula. They may form if you have a condition called diverticulosis. What are the causes? You may get this condition if poop (stool) gets trapped in the pouches in your colon. The poop lets germs (bacteria) grow. This causes an infection. What increases the risk? You are more likely to get this condition if you have small pouches in your colon. You are also more likely to get it if: You are overweight or very overweight (obese). You do not exercise enough. You drink alcohol. You smoke. You eat a lot of red meat, like beef, pork, or lamb. You do not eat enough fiber. You are older than 64 years of age. What are the signs or symptoms? Pain in your belly. Pain is often on the left side, but it may be felt in other spots too. Fever and chills. Feeling like you may vomit. Vomiting. Having cramps. Feeling full. Changes in how often you poop. Blood in your poop. How is this treated? Most cases are treated at home. You may be told to: Take over-the-counter pain medicines. Only eat and drink clear liquids. Take antibiotics. Rest. Very bad cases may need to be treated at a hospital. Treatment may include: Not eating or drinking. Taking pain medicines. Getting antibiotics through an IV tube. Getting fluid and food through an IV tube. Having surgery. When you are feeling better, you may need to have a test to look at your colon (colonoscopy). Follow these instructions at home: Medicines Take over-the-counter and prescription medicines only as told by your doctor. These include: Fiber pills. Probiotics. Medicines to make your poop soft (stool softeners). If you were prescribed antibiotics, take them as told by your doctor. Do not stop taking them even if you start to feel better. Ask your  doctor if you should avoid driving or using machines while you are taking your medicine. Eating and drinking  Follow the diet told by your doctor. You may need to only eat and drink liquids. When you feel better, you may be able to eat more foods. You may also be told to eat a lot of fiber. Fiber helps you poop. Foods with fiber include berries, beans, lentils, and green vegetables. Try not to eat red meat. General instructions Do not smoke or use any products that contain nicotine or tobacco. If you need help quitting, ask your doctor. Exercise 3 or more times a week. Try to go for 30 minutes each time. Exercise enough to sweat and make your heart beat faster. Contact a doctor if: Your pain gets worse. You are not pooping like normal. Your symptoms do not get better. Your symptoms get worse very fast. You have a fever. You vomit more than one time. You have poop that is: Bloody. Black. Tarry. This information is not intended to replace advice given to you by your health care provider. Make sure you discuss any questions you have with your health care provider. Document Revised: 07/21/2022 Document Reviewed: 07/21/2022 Elsevier Patient Education  2023 [employer]  

## 2023-02-23 ENCOUNTER — Other Ambulatory Visit: Payer: Self-pay | Admitting: Nurse Practitioner

## 2023-02-23 DIAGNOSIS — J302 Other seasonal allergic rhinitis: Secondary | ICD-10-CM

## 2023-02-23 DIAGNOSIS — I10 Essential (primary) hypertension: Secondary | ICD-10-CM

## 2023-02-27 ENCOUNTER — Ambulatory Visit: Payer: Commercial Managed Care - PPO | Admitting: Podiatry

## 2023-02-27 ENCOUNTER — Encounter: Payer: Self-pay | Admitting: Podiatry

## 2023-02-27 DIAGNOSIS — L600 Ingrowing nail: Secondary | ICD-10-CM

## 2023-02-27 DIAGNOSIS — Q828 Other specified congenital malformations of skin: Secondary | ICD-10-CM | POA: Diagnosis not present

## 2023-02-28 ENCOUNTER — Ambulatory Visit: Payer: Commercial Managed Care - PPO | Admitting: Nurse Practitioner

## 2023-02-28 NOTE — Progress Notes (Signed)
Subjective:   Patient ID: Jay Brooks, male   DOB: 62 y.o.   MRN: [id number]   HPI Patient presents with several problems with 1 being a painful callus ball of the right foot and concerns about discoloration of the left big toenail.  Patient's not been seen for a while and states the right foot does get sore with ambulation   ROS      Objective:  Physical Exam  Neuro ocular status intact with patient found to have significant keratotic lesion subsecond metatarsal right that has lucent core painful when pressed and has discoloration of the left big toenail     Assessment:  Probability for porokeratotic lesion right possibility for structural involvement of the metatarsal along with nail damage left big toe     Plan:  H&P reviewed both conditions and I went ahead courtesy debridement of the lesion padded discussed orthotics or possible elevating osteotomy which may be necessary and do not recommend treatment of the nail at this time.  Education also about nail removal which may at 1 point be necessary and would have to be a permanent procedure given

## 2023-03-21 ENCOUNTER — Encounter: Payer: Self-pay | Admitting: Internal Medicine

## 2023-03-21 ENCOUNTER — Ambulatory Visit (INDEPENDENT_AMBULATORY_CARE_PROVIDER_SITE_OTHER): Payer: Commercial Managed Care - PPO | Admitting: Internal Medicine

## 2023-03-21 VITALS — BP 118/70 | HR 62 | Temp 97.9°F | Resp 16 | Ht 69.0 in | Wt 173.4 lb

## 2023-03-21 DIAGNOSIS — E782 Mixed hyperlipidemia: Secondary | ICD-10-CM

## 2023-03-21 DIAGNOSIS — R7309 Other abnormal glucose: Secondary | ICD-10-CM | POA: Diagnosis not present

## 2023-03-21 DIAGNOSIS — Z79899 Other long term (current) drug therapy: Secondary | ICD-10-CM

## 2023-03-21 DIAGNOSIS — E349 Endocrine disorder, unspecified: Secondary | ICD-10-CM

## 2023-03-21 DIAGNOSIS — E538 Deficiency of other specified B group vitamins: Secondary | ICD-10-CM

## 2023-03-21 DIAGNOSIS — E559 Vitamin D deficiency, unspecified: Secondary | ICD-10-CM | POA: Diagnosis not present

## 2023-03-21 DIAGNOSIS — I10 Essential (primary) hypertension: Secondary | ICD-10-CM | POA: Diagnosis not present

## 2023-03-21 LAB — CBC WITH DIFFERENTIAL/PLATELET
Eosinophils Relative: 1 %
Hemoglobin: 14.7 g/dL (ref 13.2–17.1)
Neutro Abs: 3136 cells/uL (ref 1500–7800)
Total Lymphocyte: 26.8 %

## 2023-03-21 NOTE — Patient Instructions (Signed)

## 2023-03-21 NOTE — Progress Notes (Signed)
Future Appointments  Date Time Provider Department  03/21/2023                      6 mo ov  2:30 PM Lucky Cowboy, MD [facility]  09/20/2023                    cpe  2:00 PM Lucky Cowboy, MD [facility]    History of Present Illness:       This very nice 62 y.o. MWM presents for 3 month follow up with HTN, HLD, Pre-Diabetes and Vitamin D Deficiency.         Patient is treated for HTN  (2005)   & BP has been controlled at home. Today's BP is at goal - 118/70. Patient has had no complaints of any cardiac type chest pain, palpitations, dyspnea / orthopnea / PND, dizziness, claudication, or dependent edema.        Hyperlipidemia is controlled with diet & meds. Patient denies myalgias or other med SE's. Last Lipids were  Lab Results  Component Value Date   CHOL 168 12/19/2022   HDL 41 12/19/2022   LDLCALC 92 12/19/2022   TRIG 268 (H) 12/19/2022   CHOLHDL 4.1 12/19/2022      Also, the patient has history of PreDiabetes  /Insulin Resistance (A1c 5.0% /elevated Insulin "55" /2014) and has had no symptoms of reactive hypoglycemia, diabetic polys, paresthesias or visual blurring.  Last A1c was   Lab Results  Component Value Date   HGBA1C 5.3 09/14/2022                                                           Patient has Testosterone Deficiency ("150" /2010) and  is on replacement by injection with improved stamina & sense of well being.                                                       Further, the patient also has history of Vitamin D Deficiency  ("32" /2009) and supplements vitamin D . Last vitamin D was at goal :  Lab Results  Component Value Date   VD25OH 88 09/14/2022     Current Outpatient Medications  Medication Instructions   amoxicillin (AMOXIL) 500 mg, Oral, 3 times daily   atenolol (TENORMIN) 25 MG tablet Oral   Azelastine HCl 137 MCG/SPRAY SOLN SMARTSIG:2 Spray(s) Both Nares Every Evening   bisoprolol (ZEBETA) 10 MG tablet     bisoprolol-hctz2.5-6.25 MG tablet 1 tablet, Oral, Daily   calcium carbonate (TUMS EX) 750 MG chewable tablet 1 tablet, Oral, Daily   chlorpheniramine  4 MG tablet Oral   cholecalciferol (VITAMIN D3) 1,000 Units, Oral, Daily   clindamycin (CLEOCIN T) 1 % lotion 1 application , Topical, Every evening   famotidine (PEPCID) 20 mg, Oral, Daily   FLONASE  nasal spray 2 sprays, Each Nare, Daily   FLOVENT HFA) 110 MCG/ACT inhaler Inhalation, 2 times daily   gabapentin  300 MG capsule Oral   hydrocortisone cream 1 % 1 application , Topical, Daily PRN   ipratropium  0.02 % neb soln  loratadine   Daily   Methylcobalamin (B12) 5000 MCG SL Place under the tongue.   montelukast 10 MG tablet TAKE 1 TABLET DAILY    Olopatadine HCl 0.2 % SOLN 1 drop, Both Eyes, Daily PRN   PULMICORT FLEXHALER  1 puff, Inhalation, 2 times daily   RABEprazole (ACIPHEX) 20 mg 2 times daily   tamsulosin (FLOMAX) 0.4 MG CAPS capsule TAKE 1 CAPSULE AT BEDTIME FOR PROSTATE   triamcinolone cream  0.1 % 1 application , Topical, 2 times daily PRN   XIIDRA 5 % SOLN 1 drop, Ophthalmic, 2 times daily   Zinc 50 MG TABS Take 1 tablet Daily     Allergies  Allergen Reactions   Allegra [Fexofenadine] Other (See Comments)    Worsened insomnia   Benzoyl Peroxide     blisters   Decadron [Dexamethasone]     Reflux & Hiccups, GI upset   Doxycycline Nausea Only   Levaquin [Levofloxacin] Nausea And Vomiting    Patient states that after he took levaquin this past time he had nausea with vomiting     PMHx:   Past Medical History:  Diagnosis Date   Allergy    Chronic cough    Chronic sinusitis    Diverticulitis 2018   Mild   Diverticulosis of colon (without mention of hemorrhage)    Early satiety    Esophageal motility disorder 12/2018   Family history of adverse reaction to anesthesia    mom has PONV   GERD (gastroesophageal reflux disease)    History of kidney stones    2017   History of left inguinal hernia     Hypertension    Insomnia    Lyme disease    QUESTIONABLE   Pulmonary nodules 10/2018   Scattered tiny subpleural pulmonary nodules at the peripheral right lung base, largest 3 mm   Right inguinal hernia 2017   Small fat containing right inguinal hernia   Sleep apnea    no CPAP   Sleep deficient    Testosterone deficiency    Vitamin D deficiency    Wears glasses      Immunization History  Administered Date(s) Administered   Influenza Inj Mdck Quad  08/14/2019, 09/07/2020   Influenza Split 08/07/2012, 09/25/2014   Influenza, High Dose  09/05/2018   Influenza, Seasonal 09/25/2015   Influenza,inj,Quad  09/14/2021, 09/14/2022   Influenza,inj,quad 09/15/2016   PFIZER SARS-COV-2 Vacc 04/17/2020, 05/08/2020, 06/23/2020   PPD Test 09/07/2020, 09/14/2021, 09/14/2022   Td 09/07/2020   Tdap 07/08/2010   Zoster Recombinat (Shingrix) 10/14/2021, 03/11/2022     Past Surgical History:  Procedure Laterality Date   BRAVO PH STUDY N/A 05/02/2019   Procedure: BRAVO PH STUDY;  Surgeon: Rachael Fee, MD;  Location: [facility];  Service: Endoscopy;  Laterality: N/A;   COLONOSCOPY  02/2011   ESOPHAGOGASTRODUODENOSCOPY (EGD) WITH PROPOFOL N/A 05/02/2019   Procedure: ESOPHAGOGASTRODUODENOSCOPY (EGD) WITH PROPOFOL;  Surgeon: Rachael Fee, MD;  Location: [facility];  Service: Endoscopy;  Laterality: N/A;   HERNIA REPAIR     NASAL SEPTOPLASTY W/ TURBINOPLASTY Bilateral 04/21/2021   Procedure: NASAL SEPTOPLASTY WITH  BILATERAL TURBINATE REDUCTION;  Surgeon: Newman Pies, MD;  Location: [facility];  Service: ENT;  Laterality: Bilateral;   TOE SURGERY     INGROWN TOE NAIL, CHILDHOOD   UPPER GASTROINTESTINAL ENDOSCOPY     VASECTOMY    789   FHx:    Reviewed / unchanged   SHx:    Reviewed / unchanged    Systems  Review:  Constitutional: Denies fever, chills, wt changes, headaches, insomnia, fatigue, night sweats, change in appetite. Eyes: Denies redness, blurred vision, diplopia,  discharge, itchy, watery eyes.  ENT: Denies discharge, congestion, post nasal drip, epistaxis, sore throat, earache, hearing loss, dental pain, tinnitus, vertigo, sinus pain, snoring.  CV: Denies chest pain, palpitations, irregular heartbeat, syncope, dyspnea, diaphoresis, orthopnea, PND, claudication or edema. Respiratory: denies cough, dyspnea, DOE, pleurisy, hoarseness, laryngitis, wheezing.  Gastrointestinal: Denies dysphagia, odynophagia, heartburn, reflux, water brash, abdominal pain or cramps, nausea, vomiting, bloating, diarrhea, constipation, hematemesis, melena, hematochezia  or hemorrhoids. Genitourinary: Denies dysuria, frequency, urgency, nocturia, hesitancy, discharge, hematuria or flank pain. Musculoskeletal: Denies arthralgias, myalgias, stiffness, jt. swelling, pain, limping or strain/sprain.  Skin: Denies pruritus, rash, hives, warts, acne, eczema or change in skin lesion(s). Neuro: No weakness, tremor, incoordination, spasms, paresthesia or pain. Psychiatric: Denies confusion, memory loss or sensory loss. Endo: Denies change in weight, skin or hair change.  Heme/Lymph: No excessive bleeding, bruising or enlarged lymph nodes.   Physical Exam  BP 118/70   Pulse 62   Temp 97.9 F (36.6 C)   Resp 16   Ht 5\' 9"  (1.753 m)   Wt 173 lb 6.4 oz (78.7 kg)   SpO2 97%   BMI 25.61 kg/m   Appears  well nourished, well groomed  and in no distress.  Eyes: PERRLA, EOMs, conjunctiva no swelling or erythema. Sinuses: No frontal/maxillary tenderness ENT/Mouth: EAC's clear, TM's nl w/o erythema, bulging. Nares clear w/o erythema, swelling, exudates. Oropharynx clear without erythema or exudates. Oral hygiene is good. Tongue normal, non obstructing. Hearing intact.  Neck: Supple. Thyroid not palpable. Car 2+/2+ without bruits, nodes or JVD. Chest: Respirations nl with BS clear & equal w/o rales, rhonchi, wheezing or stridor.  Cor: Heart sounds normal w/ regular rate and rhythm without  sig. murmurs, gallops, clicks or rubs. Peripheral pulses normal and equal  without edema.  Abdomen: Soft & bowel sounds normal. Non-tender w/o guarding, rebound, hernias, masses or organomegaly.  Lymphatics: Unremarkable.  Musculoskeletal: Full ROM all peripheral extremities, joint stability, 5/5 strength and normal gait.  Skin: Warm, dry without exposed rashes, lesions or ecchymosis apparent.  Neuro: Cranial nerves intact, reflexes equal bilaterally. Sensory-motor testing grossly intact. Tendon reflexes grossly intact.  Pysch: Alert & oriented x 3.  Insight and judgement nl & appropriate. No ideations.   Assessment and Plan:  1. Essential hypertension  - Continue medication, monitor blood pressure at home.  - Continue DASH diet.  Reminder to go to the ER if any CP,  SOB, nausea, dizziness, severe HA, changes vision/speech.    - CBC with Differential/Platelet - COMPLETE METABOLIC PANEL WITH GFR - Magnesium - TSH  2. Hyperlipidemia, mixed  - Continue diet/meds, exercise,& lifestyle modifications.  - Continue monitor periodic cholesterol/liver & renal functions      - Lipid panel - TSH  3. Abnormal glucose  - Continue diet, exercise  - Lifestyle modifications.  - Monitor appropriate labs    - Hemoglobin A1c - Insulin, random  4. Vitamin D deficiency  - Continue supplementation   - VITAMIN D 25 Hydroxy   5. Vitamin B12 deficiency  - Vitamin B12  6. Testosterone deficiency  - Testosterone  7. Medication management  - CBC with Differential/Platelet - COMPLETE METABOLIC PANEL WITH GFR - Magnesium - Lipid panel - TSH - Hemoglobin A1c - Insulin, random - VITAMIN D 25 Hydroxy - Testosterone          Discussed  regular exercise, BP monitoring, weight  control to achieve/maintain BMI less than 25 and discussed med and SE's. Recommended labs to assess /monitor clinical status .  I discussed the assessment and treatment plan with the patient. The patient was  provided an opportunity to ask questions and all were answered. The patient agreed with the plan and demonstrated an understanding of the instructions.  I provided over 30 minutes of exam, counseling, chart review and  complex critical decision making.        The patient was advised to call back or seek an in-person evaluation if the symptoms worsen or if the condition fails to improve as anticipated.   Marinus Maw, MD

## 2023-03-22 ENCOUNTER — Other Ambulatory Visit: Payer: Self-pay | Admitting: Internal Medicine

## 2023-03-22 LAB — COMPLETE METABOLIC PANEL WITH GFR
AG Ratio: 1.6 (calc) (ref 1.0–2.5)
ALT: 21 U/L (ref 9–46)
AST: 26 U/L (ref 10–35)
Albumin: 4.7 g/dL (ref 3.6–5.1)
Alkaline phosphatase (APISO): 61 U/L (ref 35–144)
BUN: 18 mg/dL (ref 7–25)
CO2: 26 mmol/L (ref 20–32)
Calcium: 9.4 mg/dL (ref 8.6–10.3)
Chloride: 100 mmol/L (ref 98–110)
Creat: 0.99 mg/dL (ref 0.70–1.35)
Globulin: 2.9 g/dL (calc) (ref 1.9–3.7)
Glucose, Bld: 94 mg/dL (ref 65–99)
Potassium: 4.1 mmol/L (ref 3.5–5.3)
Sodium: 138 mmol/L (ref 135–146)
Total Bilirubin: 0.6 mg/dL (ref 0.2–1.2)
Total Protein: 7.6 g/dL (ref 6.1–8.1)
eGFR: 85 mL/min/{1.73_m2} (ref >=60)

## 2023-03-22 LAB — TESTOSTERONE: Testosterone: 175 ng/dL — ABNORMAL LOW (ref 250–827)

## 2023-03-22 LAB — HEMOGLOBIN A1C
Hgb A1c MFr Bld: 5.5 % of total Hgb (ref <5.7)
Mean Plasma Glucose: 111 mg/dL
eAG (mmol/L): 6.2 mmol/L

## 2023-03-22 LAB — CBC WITH DIFFERENTIAL/PLATELET
Absolute Monocytes: 598 cells/uL (ref 200–950)
Basophils Absolute: 21 cells/uL (ref 0–200)
Basophils Relative: 0.4 %
Eosinophils Absolute: 52 cells/uL (ref 15–500)
HCT: 42.4 % (ref 38.5–50.0)
Lymphs Abs: 1394 cells/uL (ref 850–3900)
MCH: 31.3 pg (ref 27.0–33.0)
MCHC: 34.7 g/dL (ref 32.0–36.0)
MCV: 90.2 fL (ref 80.0–100.0)
MPV: 9.6 fL (ref 7.5–12.5)
Monocytes Relative: 11.5 %
Neutrophils Relative %: 60.3 %
Platelets: 187 10*3/uL (ref 140–400)
RBC: 4.7 10*6/uL (ref 4.20–5.80)
RDW: 12.4 % (ref 11.0–15.0)
WBC: 5.2 10*3/uL (ref 3.8–10.8)

## 2023-03-22 LAB — LIPID PANEL
Cholesterol: 180 mg/dL (ref <200)
HDL: 39 mg/dL — ABNORMAL LOW (ref >=40)
LDL Cholesterol (Calc): 89 mg/dL (calc)
Non-HDL Cholesterol (Calc): 141 mg/dL (calc) — ABNORMAL HIGH (ref <130)
Total CHOL/HDL Ratio: 4.6 (calc) (ref <5.0)
Triglycerides: 387 mg/dL — ABNORMAL HIGH (ref <150)

## 2023-03-22 LAB — INSULIN, RANDOM: Insulin: 22.3 u[IU]/mL — ABNORMAL HIGH

## 2023-03-22 LAB — MAGNESIUM: Magnesium: 2.1 mg/dL (ref 1.5–2.5)

## 2023-03-22 LAB — VITAMIN B12: Vitamin B-12: 459 pg/mL (ref 200–1100)

## 2023-03-22 LAB — TSH: TSH: 2.86 mIU/L (ref 0.40–4.50)

## 2023-03-22 LAB — VITAMIN D 25 HYDROXY (VIT D DEFICIENCY, FRACTURES): Vit D, 25-Hydroxy: 83 ng/mL (ref 30–100)

## 2023-03-22 NOTE — Progress Notes (Signed)
^<^<^<^<^<^<^<^<^<^<^<^<^<^<^<^<^<^<^<^<^<^<^<^<^<^<^<^<^<^<^<^<^<^<^<^<^ ^>^>^>^>^>^>^>^>^>^>^>>^>^>^>^>^>^>^>^>^>^>^>^>^>^>^>^>^>^>^>^>^>^>^>^>^>  -  Test results slightly outside the reference range are not unusual. If there is anything important, I will review this with you,  otherwise it is considered normal test values.  If you have further questions,  please do not hesitate to contact me at the office or via My Chart.   ^<^<^<^<^<^<^<^<^<^<^<^<^<^<^<^<^<^<^<^<^<^<^<^<^<^<^<^<^<^<^<^<^<^<^<^<^ ^>^>^>^>^>^>^>^>^>^>^>^>^>^>^>^>^>^>^>^>^>^>^>^>^>^>^>^>^>^>^>^>^>^>^>^>^  - Testosterone level - Low    -  Recommend take Zinc 50 mg tab which may help raise                                                                       Testosterone levels naturally   -  Also exercise 20-30 minutes  2 x /day will help raise Testosterone levels  -  Losing weight helps raise Testosterone levels as                                     fat tissue metabolizes Testosterone into Estrogen !   ^>^>^>^>^>^>^>^>^>^>^>^>^>^>^>^>^>^>^>^>^>^>^>^>^>^>^>^>^>^>^>^>^>^>^>^>^ ^>^>^>^>^>^>^>^>^>^>^>^>^>^>^>^>^>^>^>^>^>^>^>^>^>^>^>^>^>^>^>^>^>^>^>^>^  - Chol = 180  &  LDL = 89  - Both  Excellent   - Very low risk for Heart Attack  / Stroke   - But , Triglycerides ( = 387 ) or fats in blood are too high                 (   Ideal or  Goal is less than 150  !  )    - Recommend avoid fried & greasy foods,  sweets / candy,   - Avoid white rice  (brown or wild rice or Quinoa is OK),   - Avoid white potatoes  (sweet potatoes are OK)   - Avoid anything made from white flour  - bagels, doughnuts, rolls, buns, biscuits, white and   wheat breads, pizza crust and traditional  pasta made of white flour & egg white  - (vegetarian pasta or spinach or wheat pasta is OK).    - Multi-grain bread is OK - like multi-grain flat bread or  sandwich thins.   - Avoid alcohol in excess.   - Exercise is also  important. ^>^>^>^>^>^>^>^>^>^>^>^>^>^>^>^>^>^>^>^>^>^>^>^>^>^>^>^>^>^>^>^>^>^>^>^>^ ^>^>^>^>^>^>^>^>^>^>^>^>^>^>^>^>^>^>^>^>^>^>^>^>^>^>^>^>^>^>^>^>^>^>^>^>^  - Vitamin D = 834 - Excellent  - Please keep dose same   ^>^>^>^>^>^>^>^>^>^>^>^>^>^>^>^>^>^>^>^>^>^>^>^>^>^>^>^>^>^>^>^>^>^>^>^>^  - A1c - Normal - No Diabetes  - Great !  ^>^>^>^>^>^>^>^>^>^>^>^>^>^>^>^>^>^>^>^>^>^>^>^>^>^>^>^>^>^>^>^>^>^>^>^>^  -  All Else - CBC - Kidneys - Electrolytes - Liver - Magnesium & Thyroid    - all  Normal / OK ^>^>^>^>^>^>^>^>^>^>^>^>^>^>^>^>^>^>^>^>^>^>^>^>^>^>^>^>^>^>^>^>^>^>^>^>^ ^>^>^>^>^>^>^>^>^>^>^>^>^>^>^>^>^>^>^>^>^>^>^>^>^>^>^>^>^>^>^>^>^>^>^>^>^

## 2023-03-31 ENCOUNTER — Other Ambulatory Visit: Payer: Self-pay | Admitting: Internal Medicine

## 2023-03-31 DIAGNOSIS — J4520 Mild intermittent asthma, uncomplicated: Secondary | ICD-10-CM

## 2023-04-05 ENCOUNTER — Other Ambulatory Visit: Payer: Self-pay | Admitting: Internal Medicine

## 2023-04-05 DIAGNOSIS — J4520 Mild intermittent asthma, uncomplicated: Secondary | ICD-10-CM

## 2023-04-11 ENCOUNTER — Encounter: Payer: Commercial Managed Care - PPO | Admitting: Internal Medicine

## 2023-04-13 NOTE — Progress Notes (Deleted)
Assessment and Plan:  There are no diagnoses linked to this encounter.    Further disposition pending results of labs. Discussed med's effects and SE's.   Over 30 minutes of exam, counseling, chart review, and critical decision making was performed.   Future Appointments  Date Time Provider Department Center  04/17/2023 11:30 AM Raynelle Dick, NP [facility] None  07/04/2023  2:30 PM Adela Glimpse, NP [facility] None  10/10/2023  2:00 PM Adela Glimpse, NP [facility] None  01/15/2024  2:30 PM Cranford, Archie Patten, NP [facility] None    ------------------------------------------------------------------------------------------------------------------   HPI There were no vitals taken for this visit. 62 y.o.male presents for  Past Medical History:  Diagnosis Date   Allergy    Chronic cough    Chronic sinusitis    Diverticulitis 2018   Mild   Diverticulosis of colon (without mention of hemorrhage)    Early satiety    Esophageal motility disorder 12/2018   Family history of adverse reaction to anesthesia    mom has PONV   GERD (gastroesophageal reflux disease)    History of kidney stones    2017   History of left inguinal hernia    Hypertension    Insomnia    Lyme disease    QUESTIONABLE   Pulmonary nodules 10/2018   Scattered tiny subpleural pulmonary nodules at the peripheral right lung base, largest 3 mm   Right inguinal hernia 2017   Small fat containing right inguinal hernia   Sleep apnea    no CPAP   Sleep deficient    Testosterone deficiency    Vitamin D deficiency    Wears glasses      Allergies  Allergen Reactions   Allegra [Fexofenadine] Other (See Comments)    Worsened insomnia   Benzoyl Peroxide     blisters   Decadron [Dexamethasone]     Reflux & Hiccups, GI upset   Doxycycline Nausea Only   Levaquin [Levofloxacin] Nausea And Vomiting    Patient states that after he took levaquin this past time he had nausea with vomiting    Current Outpatient  Medications on File Prior to Visit  Medication Sig   Azelastine HCl 137 MCG/SPRAY SOLN SMARTSIG:2 Spray(s) Both Nares Every Evening   bisoprolol-hydrochlorothiazide (ZIAC) 2.5-6.25 MG tablet TAKE 1 TABLET BY MOUTH EVERY DAY   calcium carbonate (TUMS EX) 750 MG chewable tablet Chew 1 tablet by mouth daily.   chlorpheniramine (CHLOR-TRIMETON) 4 MG tablet Take by mouth.   cholecalciferol (VITAMIN D) 25 MCG (1000 UNIT) tablet Take 1,000 Units by mouth daily.   clindamycin (CLEOCIN T) 1 % lotion Apply 1 application topically every evening.   famotidine (PEPCID) 20 MG tablet Take 20 mg by mouth daily.   fluticasone (FLONASE) 50 MCG/ACT nasal spray Place 2 sprays into both nostrils daily.    fluticasone (FLOVENT HFA) 110 MCG/ACT inhaler Inhale into the lungs 2 (two) times daily.   hydrocortisone cream 1 % Apply 1 application topically daily as needed for itching.   loratadine (CLARITIN) 10 MG tablet Take 10 mg by mouth daily.   montelukast (SINGULAIR) 10 MG tablet TAKE 1 TABLET BY MOUTH DAILY FOR ALLERGIES   Olopatadine HCl 0.2 % SOLN Place 1 drop into both eyes daily as needed.   PULMICORT FLEXHALER 180 MCG/ACT inhaler Inhale 1 puff into the lungs 2 (two) times daily.   RABEprazole (ACIPHEX) 20 MG tablet Take 1 tablet (20 mg total) by mouth 2 (two) times daily.   tamsulosin (FLOMAX) 0.4 MG CAPS capsule TAKE  1 CAPSULE AT BEDTIME FOR PROSTATE   triamcinolone cream (KENALOG) 0.1 % Apply 1 application topically 2 (two) times daily as needed for itching.   Zinc 50 MG TABS Take 1 tablet Daily   No current facility-administered medications on file prior to visit.    ROS: all negative except above.   Physical Exam:  There were no vitals taken for this visit.  General Appearance: Well nourished, in no apparent distress. Eyes: PERRLA, EOMs, conjunctiva no swelling or erythema Sinuses: No Frontal/maxillary tenderness ENT/Mouth: Ext aud canals clear, TMs without erythema, bulging. No erythema,  swelling, or exudate on post pharynx.  Tonsils not swollen or erythematous. Hearing normal.  Neck: Supple, thyroid normal.  Respiratory: Respiratory effort normal, BS equal bilaterally without rales, rhonchi, wheezing or stridor.  Cardio: RRR with no MRGs. Brisk peripheral pulses without edema.  Abdomen: Soft, + BS.  Non tender, no guarding, rebound, hernias, masses. Lymphatics: Non tender without lymphadenopathy.  Musculoskeletal: Full ROM, 5/5 strength, normal gait.  Skin: Warm, dry without rashes, lesions, ecchymosis.  Neuro: Cranial nerves intact. Normal muscle tone, no cerebellar symptoms. Sensation intact.  Psych: Awake and oriented X 3, normal affect, Insight and Judgment appropriate.     Raynelle Dick, NP 1:40 PM [facility]

## 2023-04-15 ENCOUNTER — Other Ambulatory Visit: Payer: Self-pay | Admitting: Nurse Practitioner

## 2023-04-15 DIAGNOSIS — I10 Essential (primary) hypertension: Secondary | ICD-10-CM

## 2023-04-17 ENCOUNTER — Ambulatory Visit: Payer: Commercial Managed Care - PPO | Admitting: Nurse Practitioner

## 2023-04-26 NOTE — Progress Notes (Unsigned)
Assessment and Plan:  There are no diagnoses linked to this encounter.    Further disposition pending results of labs. Discussed med's effects and SE's.   Over 30 minutes of exam, counseling, chart review, and critical decision making was performed.   Future Appointments  Date Time Provider Department Center  04/27/2023  2:00 PM Raynelle Dick, NP [facility] None  07/04/2023  2:30 PM Adela Glimpse, NP [facility] None  10/10/2023  2:00 PM Adela Glimpse, NP [facility] None  01/15/2024  2:30 PM Cranford, Archie Patten, NP [facility] None    ------------------------------------------------------------------------------------------------------------------   HPI There were no vitals taken for this visit. 62 y.o.male presents for  Past Medical History:  Diagnosis Date   Allergy    Chronic cough    Chronic sinusitis    Diverticulitis 2018   Mild   Diverticulosis of colon (without mention of hemorrhage)    Early satiety    Esophageal motility disorder 12/2018   Family history of adverse reaction to anesthesia    mom has PONV   GERD (gastroesophageal reflux disease)    History of kidney stones    2017   History of left inguinal hernia    Hypertension    Insomnia    Lyme disease    QUESTIONABLE   Pulmonary nodules 10/2018   Scattered tiny subpleural pulmonary nodules at the peripheral right lung base, largest 3 mm   Right inguinal hernia 2017   Small fat containing right inguinal hernia   Sleep apnea    no CPAP   Sleep deficient    Testosterone deficiency    Vitamin D deficiency    Wears glasses      Allergies  Allergen Reactions   Allegra [Fexofenadine] Other (See Comments)    Worsened insomnia   Benzoyl Peroxide     blisters   Decadron [Dexamethasone]     Reflux & Hiccups, GI upset   Doxycycline Nausea Only   Levaquin [Levofloxacin] Nausea And Vomiting    Patient states that after he took levaquin this past time he had nausea with vomiting    Current Outpatient  Medications on File Prior to Visit  Medication Sig   Azelastine HCl 137 MCG/SPRAY SOLN SMARTSIG:2 Spray(s) Both Nares Every Evening   bisoprolol-hydrochlorothiazide (ZIAC) 2.5-6.25 MG tablet Take  1 tablet  Daily  for BP                                                                 /                                                                   TAKE                                         BY  MOUTH   calcium carbonate (TUMS EX) 750 MG chewable tablet Chew 1 tablet by mouth daily.   chlorpheniramine (CHLOR-TRIMETON) 4 MG tablet Take by mouth.   cholecalciferol (VITAMIN D) 25 MCG (1000 UNIT) tablet Take 1,000 Units by mouth daily.   clindamycin (CLEOCIN T) 1 % lotion Apply 1 application topically every evening.   famotidine (PEPCID) 20 MG tablet Take 20 mg by mouth daily.   fluticasone (FLONASE) 50 MCG/ACT nasal spray Place 2 sprays into both nostrils daily.    fluticasone (FLOVENT HFA) 110 MCG/ACT inhaler Inhale into the lungs 2 (two) times daily.   hydrocortisone cream 1 % Apply 1 application topically daily as needed for itching.   loratadine (CLARITIN) 10 MG tablet Take 10 mg by mouth daily.   montelukast (SINGULAIR) 10 MG tablet TAKE 1 TABLET BY MOUTH DAILY FOR ALLERGIES   Olopatadine HCl 0.2 % SOLN Place 1 drop into both eyes daily as needed.   PULMICORT FLEXHALER 180 MCG/ACT inhaler Inhale 1 puff into the lungs 2 (two) times daily.   RABEprazole (ACIPHEX) 20 MG tablet Take 1 tablet (20 mg total) by mouth 2 (two) times daily.   tamsulosin (FLOMAX) 0.4 MG CAPS capsule TAKE 1 CAPSULE AT BEDTIME FOR PROSTATE   triamcinolone cream (KENALOG) 0.1 % Apply 1 application topically 2 (two) times daily as needed for itching.   Zinc 50 MG TABS Take 1 tablet Daily   No current facility-administered medications on file prior to visit.    ROS: all negative except above.   Physical Exam:  There were no vitals taken for this visit.  General  Appearance: Well nourished, in no apparent distress. Eyes: PERRLA, EOMs, conjunctiva no swelling or erythema Sinuses: No Frontal/maxillary tenderness ENT/Mouth: Ext aud canals clear, TMs without erythema, bulging. No erythema, swelling, or exudate on post pharynx.  Tonsils not swollen or erythematous. Hearing normal.  Neck: Supple, thyroid normal.  Respiratory: Respiratory effort normal, BS equal bilaterally without rales, rhonchi, wheezing or stridor.  Cardio: RRR with no MRGs. Brisk peripheral pulses without edema.  Abdomen: Soft, + BS.  Non tender, no guarding, rebound, hernias, masses. Lymphatics: Non tender without lymphadenopathy.  Musculoskeletal: Full ROM, 5/5 strength, normal gait.  Skin: Warm, dry without rashes, lesions, ecchymosis.  Neuro: Cranial nerves intact. Normal muscle tone, no cerebellar symptoms. Sensation intact.  Psych: Awake and oriented X 3, normal affect, Insight and Judgment appropriate.     Raynelle Dick, NP 1:44 PM [facility]

## 2023-04-27 ENCOUNTER — Encounter: Payer: Self-pay | Admitting: Nurse Practitioner

## 2023-04-27 ENCOUNTER — Ambulatory Visit (INDEPENDENT_AMBULATORY_CARE_PROVIDER_SITE_OTHER): Payer: Commercial Managed Care - PPO | Admitting: Nurse Practitioner

## 2023-04-27 VITALS — BP 122/74 | HR 63 | Temp 97.5°F | Ht 69.0 in | Wt 170.4 lb

## 2023-04-27 DIAGNOSIS — J4520 Mild intermittent asthma, uncomplicated: Secondary | ICD-10-CM | POA: Diagnosis not present

## 2023-04-27 DIAGNOSIS — I10 Essential (primary) hypertension: Secondary | ICD-10-CM

## 2023-04-27 DIAGNOSIS — R053 Chronic cough: Secondary | ICD-10-CM

## 2023-04-27 NOTE — Patient Instructions (Signed)
Pulmonary Function Tests Pulmonary function tests (PFTs) are breathing tests that are used to: Measure how well your lungs work. Find out what is causing your lung problems. Find the best treatment for you. You may have PFTs: If you have a condition that affects your lungs, such as asthma or chronic obstructive pulmonary disease (COPD). To watch for changes in your lung function over time if you have a long-term (chronic) lung disease. If you are an Nature conservation officer. PFTs check the effects of being exposed to chemicals over a long period of time. To check lung function: Before having surgery or other procedures. If you smoke. To check if prescribed medicines or treatments are helping your lungs. Tell a health care provider about: Any allergies you have. All medicines you are taking, including inhaler or nebulizer medicines, vitamins, herbs, eye drops, creams, and over-the-counter medicines. Any bleeding problems you have. Any surgeries you have had, especially recent surgery of the eye, abdomen, or chest. These can make PFTs difficult or unsafe. Any medical conditions you have, including chest pain or heart problems, tuberculosis, or respiratory infections such as pneumonia, a cold, or the flu. Any fear of being in closed spaces (claustrophobia). Some of your tests may be in a closed space. What are the risks? Your health care provider will talk with you about risks. These may include: Feeling light-headed due to fast, deep breathing known as overbreathing or hyperventilation. An asthma attack from deep breathing. What happens before the test? Take over-the-counter and prescription medicines only as told by your health care provider. If you take inhaler or nebulizer medicines, ask your health care provider which medicines you should take on the day of your testing. Some inhaler medicines may interfere with PFTs if they are taken shortly before the tests. Follow instructions from your  health care provider about what you may eat and drink. These may include: Avoiding eating large meals. Avoiding using caffeine before the testing. Not drinkingalcohol for up to 4 hours before the test. Do not use any products that contain nicotine or tobacco for up to 4 hours before your test. These products include cigarettes, chewing tobacco, and vaping devices, such as e-cigarettes. These can affect your test results. If you need help quitting, ask your health care provider. Wear comfortable clothing that will not get in the way of your breathing. Avoid exercise that takes a lot of effort (strenuous exercise) for at least 30 minutes before the test. What happens during the test?  You will be given: A soft noseclip to wear. This allows all of your breaths to go through your mouth instead of your nose. A germ-free (sterile) mouthpiece. It will be attached to a spirometer machine that measures your breathing. You will be asked to do breathing exercises. The exercises will be done by breathing in (inhaling) and breathing out (exhaling). You may have to repeat the exercises many times before the testing is complete. You will need to follow instructions exactly as told to get accurate results. Make sure to blow as hard and as fast as you can when you are told to do so. You may be given a medicine called a bronchodilator. This makes the small air passages in your lungs larger so you can breathe easier. The tests will be repeated after the medicine takes effect. You will be watched for any problems, such as feeling faint or dizzy, or having trouble breathing. The procedure may vary among health care providers and hospitals. What can I expect after  the test? Your results will be compared with the expected lung function of someone with healthy lungs who is similar to you in several ways. These ways include age, sex, height, weight, and race or ethnicity. This is done to show how your lung function  compares with normal lung function (percent predicted). The percent predicted helps your health care provider know if your lung function is normal or not. If you have had PFTs done before, your health care provider will compare your current results with past results. This shows if your lung function is better, worse, or the same as before. It is up to you to get the results of your procedure. Ask your health care provider, or the department that is doing the procedure, when your results will be ready. After you get your results, talk with your health care provider about treatment options, if necessary. This information is not intended to replace advice given to you by your health care provider. Make sure you discuss any questions you have with your health care provider. Document Revised: 05/16/2022 Document Reviewed: 05/16/2022 Elsevier Patient Education  2024 [employer].

## 2023-05-01 ENCOUNTER — Other Ambulatory Visit: Payer: Self-pay | Admitting: Gastroenterology

## 2023-05-24 NOTE — Progress Notes (Signed)
Assessment and Plan:  Jay Brooks was seen today for acute visit.  Diagnoses and all orders for this visit:  Essential hypertension - continue medications, DASH diet, exercise and monitor at home. Call if greater than 130/80.   Overweight with body mass index (BMI) 25.0-29.9 Recommended diet heavy in fruits and veggies and low in animal meats, cheeses, and dairy products, appropriate calorie intake Patient will work on decrease saturated fats and simple carbs.  Increase lean protein and exercise Follow up at next visit       Further disposition pending results of labs. Discussed med's effects and SE's.   Over 30 minutes of exam, counseling, chart review, and critical decision making was performed.   Future Appointments  Date Time Provider Department Center  07/04/2023  2:30 PM Adela Glimpse, NP [facility] None  10/10/2023  2:00 PM Adela Glimpse, NP [facility] None  01/15/2024  2:30 PM Cranford, Archie Patten, NP [facility] None    ------------------------------------------------------------------------------------------------------------------   HPI BP 112/70   Pulse 69   Temp 97.7 F (36.5 C)   Ht 5\' 9"  (1.753 m)   Wt 171 lb 12.8 oz (77.9 kg)   SpO2 98%   BMI 25.37 kg/m  62 y.o.male presents for Blood pressure evaluation form for work   Currently on Ziac 2.5/6.25 mg every day.  Denies headaches, chest pain, shortness of breath and dizziness BP Readings from Last 3 Encounters:  05/26/23 112/70  04/27/23 122/74  03/21/23 118/70   BMI is Body mass index is 25.37 kg/m., he has been working on diet and exercise. Wt Readings from Last 3 Encounters:  05/26/23 171 lb 12.8 oz (77.9 kg)  04/27/23 170 lb 6.4 oz (77.3 kg)  03/21/23 173 lb 6.4 oz (78.7 kg)     Past Medical History:  Diagnosis Date   Allergy    Chronic cough    Chronic sinusitis    Diverticulitis 2018   Mild   Diverticulosis of colon (without mention of hemorrhage)    Early satiety    Esophageal motility  disorder 12/2018   Family history of adverse reaction to anesthesia    mom has PONV   GERD (gastroesophageal reflux disease)    History of kidney stones    2017   History of left inguinal hernia    Hypertension    Insomnia    Lyme disease    QUESTIONABLE   Pulmonary nodules 10/2018   Scattered tiny subpleural pulmonary nodules at the peripheral right lung base, largest 3 mm   Right inguinal hernia 2017   Small fat containing right inguinal hernia   Sleep apnea    no CPAP   Sleep deficient    Testosterone deficiency    Vitamin D deficiency    Wears glasses      Allergies  Allergen Reactions   Allegra [Fexofenadine] Other (See Comments)    Worsened insomnia   Benzoyl Peroxide     blisters   Decadron [Dexamethasone]     Reflux & Hiccups, GI upset   Doxycycline Nausea Only   Levaquin [Levofloxacin] Nausea And Vomiting    Patient states that after he took levaquin this past time he had nausea with vomiting    Current Outpatient Medications on File Prior to Visit  Medication Sig   albuterol (VENTOLIN HFA) 108 (90 Base) MCG/ACT inhaler Inhale into the lungs every 6 (six) hours as needed for wheezing or shortness of breath.   Azelastine HCl 137 MCG/SPRAY SOLN SMARTSIG:2 Spray(s) Both Nares Every Evening   bisoprolol-hydrochlorothiazide [facility])  2.5-6.25 MG tablet Take  1 tablet  Daily  for BP                                                                 /                                                                   TAKE                                         BY                                                 MOUTH   calcium carbonate (TUMS EX) 750 MG chewable tablet Chew 1 tablet by mouth daily.   cholecalciferol (VITAMIN D) 25 MCG (1000 UNIT) tablet Take 1,000 Units by mouth daily. 10,000 units   clindamycin (CLEOCIN T) 1 % lotion Apply 1 application topically every evening.   famotidine (PEPCID) 20 MG tablet Take 20 mg by mouth daily.   fluticasone (FLONASE) 50 MCG/ACT  nasal spray Place 2 sprays into both nostrils daily.    hydrocortisone cream 1 % Apply 1 application topically daily as needed for itching.   loratadine (CLARITIN) 10 MG tablet Take 10 mg by mouth daily.   montelukast (SINGULAIR) 10 MG tablet TAKE 1 TABLET BY MOUTH DAILY FOR ALLERGIES   Olopatadine HCl 0.2 % SOLN Place 1 drop into both eyes daily as needed.   PULMICORT FLEXHALER 180 MCG/ACT inhaler Inhale 1 puff into the lungs 2 (two) times daily.   RABEprazole (ACIPHEX) 20 MG tablet Take 1 tablet by mouth twice daily   tamsulosin (FLOMAX) 0.4 MG CAPS capsule TAKE 1 CAPSULE AT BEDTIME FOR PROSTATE   triamcinolone cream (KENALOG) 0.1 % Apply 1 application topically 2 (two) times daily as needed for itching.   Zinc 50 MG TABS Take 1 tablet Daily   chlorpheniramine (CHLOR-TRIMETON) 4 MG tablet Take by mouth. (Patient not taking: Reported on 04/27/2023)   fluticasone (FLOVENT HFA) 110 MCG/ACT inhaler Inhale into the lungs 2 (two) times daily. (Patient not taking: Reported on 05/26/2023)   No current facility-administered medications on file prior to visit.    ROS: all negative except above.   Physical Exam:  BP 112/70   Pulse 69   Temp 97.7 F (36.5 C)   Ht 5\' 9"  (1.753 m)   Wt 171 lb 12.8 oz (77.9 kg)   SpO2 98%   BMI 25.37 kg/m   General Appearance: Well nourished, in no apparent distress. Eyes: PERRLA, EOMs, conjunctiva no swelling or erythema Sinuses: No Frontal/maxillary tenderness ENT/Mouth: Ext aud canals clear, TMs without erythema, bulging. No erythema, swelling, or exudate on post pharynx.  Tonsils not swollen or erythematous. Hearing normal.  Neck: Supple, thyroid normal.  Respiratory:  Respiratory effort normal, BS equal bilaterally without rales, rhonchi, wheezing or stridor.  Cardio: RRR with no MRGs. Brisk peripheral pulses without edema.  Abdomen: Soft, + BS.  Non tender, no guarding, rebound, hernias, masses. Lymphatics: Non tender without lymphadenopathy.   Musculoskeletal: Full ROM, 5/5 strength, normal gait.  Skin: Warm, dry without rashes, lesions, ecchymosis.  Neuro: Cranial nerves intact. Normal muscle tone, no cerebellar symptoms. Sensation intact.  Psych: Awake and oriented X 3, normal affect, Insight and Judgment appropriate.     Raynelle Dick, NP 11:16 AM [facility]

## 2023-05-26 ENCOUNTER — Ambulatory Visit (INDEPENDENT_AMBULATORY_CARE_PROVIDER_SITE_OTHER): Payer: Commercial Managed Care - PPO | Admitting: Nurse Practitioner

## 2023-05-26 ENCOUNTER — Encounter: Payer: Self-pay | Admitting: Nurse Practitioner

## 2023-05-26 VITALS — BP 112/70 | HR 69 | Temp 97.7°F | Ht 69.0 in | Wt 171.8 lb

## 2023-05-26 DIAGNOSIS — E663 Overweight: Secondary | ICD-10-CM

## 2023-05-26 DIAGNOSIS — I10 Essential (primary) hypertension: Secondary | ICD-10-CM

## 2023-05-26 NOTE — Patient Instructions (Signed)

## 2023-05-29 ENCOUNTER — Telehealth: Payer: Self-pay | Admitting: Nurse Practitioner

## 2023-05-29 DIAGNOSIS — J302 Other seasonal allergic rhinitis: Secondary | ICD-10-CM

## 2023-05-29 MED ORDER — MONTELUKAST SODIUM 10 MG PO TABS
ORAL_TABLET | ORAL | 2 refills | Status: AC
Start: 2023-05-29 — End: ?

## 2023-05-29 NOTE — Addendum Note (Signed)
Addended by: Dionicio Stall on: 05/29/2023 04:54 PM   Modules accepted: Orders

## 2023-05-29 NOTE — Telephone Encounter (Signed)
Patient states that he will be going out of town for awhile and would like to know if you will send in a 90 day supply of Singular into [facility] in [place]?

## 2023-06-13 ENCOUNTER — Telehealth: Payer: Self-pay | Admitting: Gastroenterology

## 2023-06-13 NOTE — Telephone Encounter (Signed)
Inbound call from patient stating he needed to make an appointment to get a refill for RABEprazole . Patient was scheduled for 11/6 at 1:50

## 2023-06-14 ENCOUNTER — Other Ambulatory Visit: Payer: Self-pay

## 2023-06-14 MED ORDER — RABEPRAZOLE SODIUM 20 MG PO TBEC: 20.0000 mg | DELAYED_RELEASE_TABLET | Freq: Two times a day (BID) | ORAL | 0 refills | Status: DC

## 2023-06-14 NOTE — Telephone Encounter (Signed)
Refill for patient has been sent to pharmacy.

## 2023-06-14 NOTE — Telephone Encounter (Signed)
Refill sent.

## 2023-06-17 ENCOUNTER — Other Ambulatory Visit: Payer: Self-pay | Admitting: Gastroenterology

## 2023-06-28 ENCOUNTER — Other Ambulatory Visit: Payer: Self-pay | Admitting: Gastroenterology

## 2023-07-04 ENCOUNTER — Encounter: Payer: Self-pay | Admitting: Nurse Practitioner

## 2023-07-04 ENCOUNTER — Ambulatory Visit (INDEPENDENT_AMBULATORY_CARE_PROVIDER_SITE_OTHER): Payer: Commercial Managed Care - PPO | Admitting: Nurse Practitioner

## 2023-07-04 VITALS — BP 120/76 | HR 60 | Temp 97.6°F | Ht 69.0 in | Wt 175.0 lb

## 2023-07-04 DIAGNOSIS — E291 Testicular hypofunction: Secondary | ICD-10-CM

## 2023-07-04 DIAGNOSIS — Z79899 Other long term (current) drug therapy: Secondary | ICD-10-CM

## 2023-07-04 DIAGNOSIS — F5104 Psychophysiologic insomnia: Secondary | ICD-10-CM

## 2023-07-04 DIAGNOSIS — J302 Other seasonal allergic rhinitis: Secondary | ICD-10-CM

## 2023-07-04 DIAGNOSIS — E782 Mixed hyperlipidemia: Secondary | ICD-10-CM | POA: Diagnosis not present

## 2023-07-04 DIAGNOSIS — E663 Overweight: Secondary | ICD-10-CM | POA: Diagnosis not present

## 2023-07-04 DIAGNOSIS — K219 Gastro-esophageal reflux disease without esophagitis: Secondary | ICD-10-CM

## 2023-07-04 DIAGNOSIS — R7309 Other abnormal glucose: Secondary | ICD-10-CM | POA: Diagnosis not present

## 2023-07-04 DIAGNOSIS — R7989 Other specified abnormal findings of blood chemistry: Secondary | ICD-10-CM

## 2023-07-04 DIAGNOSIS — I10 Essential (primary) hypertension: Secondary | ICD-10-CM

## 2023-07-04 DIAGNOSIS — E559 Vitamin D deficiency, unspecified: Secondary | ICD-10-CM

## 2023-07-04 DIAGNOSIS — G4733 Obstructive sleep apnea (adult) (pediatric): Secondary | ICD-10-CM

## 2023-07-04 NOTE — Patient Instructions (Signed)

## 2023-07-04 NOTE — Progress Notes (Signed)
FOLLOW UP  Assessment and Plan:   Hypertension Continue Bisoprolol-HCTZ  Discussed DASH (Dietary Approaches to Stop Hypertension) DASH diet is lower in sodium than a typical American diet. Cut back on foods that are high in saturated fat, cholesterol, and trans fats. Eat more whole-grain foods, fish, poultry, and nuts Remain active and exercise as tolerated daily.  Monitor BP at home-Call if greater than 130/80.  Check CMP/CBC  Hyperlipidemia At goal with lifestyle modifications - continue  Recommended diet heavy in fruits and veggies, omega 3's. Decrease consumption of animal meats, cheeses, and dairy products. Remain active and exercise as tolerated. Continue to monitor. Check lipids/TSH  Other abnormal glucose Education: Reviewed 'ABCs' of diabetes management  Discussed goals to be met and/or maintained include A1C (<7) Blood pressure (<130/80) Cholesterol (LDL <70) Continue Eye Exam yearly  Continue Dental Exam Q6 mo Discussed dietary recommendations Discussed Physical Activity recommendations Foot exam UTD Check A1C  BMI 25 Discussed appropriate BMI Diet modification. Physical activity. Encouraged/praised to build confidence.  Vitamin D Def Continue supplement for goal of 60-100 Monitor vitamin D levels  Testosterone deficiency Continue Zinc supplementation, testosterone, tamsulosin Monitor testosterone levels  Insomnia Preferring to avoid Rx meds at this time Currently on 1.25 mg Melatonin Discussed good sleep hygiene - complete in home sleep study by [facility]  Establish bed and wake times. Sleep restriction-only sleep estimated hrs sleep. Bed only for sex and sleep, only sleep when sleepy, out of bed if anxious (stimulus control). Reviewed relaxation techniques, mindful meditations. Expected sleep duration. Addressed worries about not sleeping.   OSA  Followed by Dr, Dohmeier Could not tolerate CPAP Underwent nasal septoplasty bilaterally  04/2021 In home sleep study scheduled for 12/20/22  Seasonal allergies Continue Montelukast, Claritin and inhalers Avoid triggers  GERD Continue sucralfate, rabeprazole No suspected reflux complications (Barret/stricture). Lifestyle modification:  wt loss, avoid meals 2-3h before bedtime. Consider eliminating food triggers:  chocolate, caffeine, EtOH, acid/spicy food.  Elevated TSH No diagnosis of hypothyroidism Monitor levels  Orders Placed This Encounter  Procedures   CBC with Differential/Platelet   COMPLETE METABOLIC PANEL WITH GFR   Lipid panel   Hemoglobin A1c   Notify office for further evaluation and treatment, questions or concerns if any reported s/s fail to improve.   The patient was advised to call back or seek an in-person evaluation if any symptoms worsen or if the condition fails to improve as anticipated.   Further disposition pending results of labs. Discussed med's effects and SE's.    I discussed the assessment and treatment plan with the patient. The patient was provided an opportunity to ask questions and all were answered. The patient agreed with the plan and demonstrated an understanding of the instructions.  Discussed med's effects and SE's. Screening labs and tests as requested with regular follow-up as recommended.  I provided 30 minutes of face-to-face time during this encounter including counseling, chart review, and critical decision making was preformed.   Future Appointments  Date Time Provider Department Center  09/13/2023  1:50 PM Mansouraty, Netty Starring., MD [facility]  10/10/2023  2:00 PM Adela Glimpse, NP [facility] None  01/15/2024  2:30 PM Jenille Laszlo, Archie Patten, NP [facility] None    ----------------------------------------------------------------------------------------------------------------------  HPI 62 y.o. male  presents for 3 month follow up on hypertension, cholesterol, glucose management, weight, hypogonadism and vitamin D  deficiency.   Overall he reports feeling well today.  He has no concerns to report at this time.  He is on Aciphex for GERD but  continues to have symptoms and also takes [place]. Is being evaluated by GI.   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 Bravo test per Dr Christella Hartigan did not reveal significant acid reflux and did not feel his cough was related to GERD. He also was seen by ENT, Dr Harriette Ohara w/o any ENT pathology identified. Allergy Evaluation by Dr Barnetta Chapel was likewise unrevealing. Patient reports PFT's per Dr Sherene Sires did not reveal Asthma and empiric treatment with MDI's did not improve his cough.  Patient has been out of work per Dr Sherene Sires & on Disability... since last Oct 2019 for Allergic Rhinitis & 'Upper Airway Cough Syndrome'..]] He is currently being evaluated by GI for persistent cough which occurs more when stomach is empty.   Patient has  Moderate OSA and is followed by Dr Vickey Huger and recommended Auto CPAP, however he was unable to tolerate. He has undergone bilateral nasal septoplasty and was fit with an oral device. He has undergone 3 separate sleep studies. He is undergoing adjustments with his oral device. He is not snoring when wearing the device.   He is now scheduled for an in home sleep study through his work/company from [facility]  Has this scheduled for 12/20/22.  He is taking Melatonin 1.25 mg with some effectiveness.  Now getting 5-7 hours of sleep instead of 4 hours.    He does use Ventolin rescue inhaler as needed and Flovent daily for cough. He does use claritin and Singulair daily for seasonal allergies.   BMI is Body mass index is 25.84 kg/m., he has been working on diet and exercise. Wt Readings from Last 3 Encounters:  07/04/23 175 lb (79.4 kg)  05/26/23 171 lb 12.8 oz (77.9 kg)  04/27/23 170 lb 6.4 oz (77.3 kg)    His blood pressure has been controlled at home running in 110-130/70's, today their BP is BP: 120/76 . He has stopped his Olmesartan as it was causing him to cough and was feeling light headed.  BP Readings from Last 3 Encounters:  07/04/23 120/76  05/26/23 112/70  04/27/23 122/74      He does not workout. He denies chest pain, shortness of breath, dizziness.   He is not on cholesterol medication. His cholesterol is not at goal. He has been  The cholesterol last visit was:   Lab Results  Component Value Date   CHOL 180 03/21/2023   HDL 39 (L) 03/21/2023   LDLCALC 89 03/21/2023   TRIG 387 (H) 03/21/2023   CHOLHDL 4.6 03/21/2023    He has not been working on diet and exercise for glucose management, and denies increased appetite, nausea, paresthesia of the feet, polydipsia and polyuria. Last A1C in the office was:  Lab Results  Component Value Date   HGBA1C 5.5 03/21/2023   Patient is on Vitamin D supplement but was at goal of 60-100:  Lab Results  Component Value Date   VD25OH 83 03/21/2023     He has a history of testosterone deficiency and he does take Zinc 50 mg daily Lab Results  Component Value Date   TESTOSTERONE 175 (L) 03/21/2023     Current Medications:  Current Outpatient Medications on File Prior to Visit  Medication Sig   albuterol (VENTOLIN HFA) 108 (90 Base) MCG/ACT inhaler Inhale into the lungs every 6 (six) hours as needed for  wheezing or shortness of breath.   bisoprolol-hydrochlorothiazide (ZIAC) 2.5-6.25 MG tablet Take  1 tablet  Daily  for BP                                                                 /                                                                   TAKE                                         BY                                                 MOUTH   calcium carbonate (TUMS EX) 750 MG chewable tablet Chew 1 tablet by mouth daily.   cholecalciferol (VITAMIN D) 25 MCG (1000 UNIT) tablet Take 1,000 Units by mouth daily. 10,000 units    clindamycin (CLEOCIN T) 1 % lotion Apply 1 application topically every evening.   cycloSPORINE (RESTASIS OP) Apply to eye 2 (two) times daily.   famotidine (PEPCID) 20 MG tablet Take 20 mg by mouth daily.   fluticasone (FLONASE) 50 MCG/ACT nasal spray Place 2 sprays into both nostrils daily.    hydrocortisone cream 1 % Apply 1 application topically daily as needed for itching.   loratadine (CLARITIN) 10 MG tablet Take 10 mg by mouth daily.   montelukast (SINGULAIR) 10 MG tablet TAKE 1 TABLET BY MOUTH DAILY FOR ALLERGIES   Olopatadine HCl 0.2 % SOLN Place 1 drop into both eyes daily as needed.   PULMICORT FLEXHALER 180 MCG/ACT inhaler Inhale 1 puff into the lungs 2 (two) times daily.   RABEprazole (ACIPHEX) 20 MG tablet TAKE 1 TABLET BY MOUTH TWICE A DAY   tamsulosin (FLOMAX) 0.4 MG CAPS capsule TAKE 1 CAPSULE AT BEDTIME FOR PROSTATE   triamcinolone cream (KENALOG) 0.1 % Apply 1 application topically 2 (two) times daily as needed for itching.   Vibegron (GEMTESA) 75 MG TABS Take by mouth daily.   Zinc 50 MG TABS Take 1 tablet Daily   Azelastine HCl 137 MCG/SPRAY SOLN SMARTSIG:2 Spray(s) Both Nares Every Evening (Patient not taking: Reported on 07/04/2023)   No current facility-administered medications on file prior to visit.     Allergies:  Allergies  Allergen Reactions   Allegra [Fexofenadine] Other (See Comments)    Worsened insomnia   Benzoyl Peroxide     blisters   Decadron [Dexamethasone]     Reflux & Hiccups, GI upset   Doxycycline Nausea Only   Levaquin [Levofloxacin] Nausea And Vomiting    Patient states that after he took levaquin this past time he had nausea with vomiting     Medical History:  Past Medical History:  Diagnosis Date   Allergy  Chronic cough    Chronic sinusitis    Diverticulitis 2018   Mild   Diverticulosis of colon (without mention of hemorrhage)    Early satiety    Esophageal motility disorder 12/2018   Family history of adverse reaction to  anesthesia    mom has PONV   GERD (gastroesophageal reflux disease)    History of kidney stones    2017   History of left inguinal hernia    Hypertension    Insomnia    Lyme disease    QUESTIONABLE   Pulmonary nodules 10/2018   Scattered tiny subpleural pulmonary nodules at the peripheral right lung base, largest 3 mm   Right inguinal hernia 2017   Small fat containing right inguinal hernia   Sleep apnea    no CPAP   Sleep deficient    Testosterone deficiency    Vitamin D deficiency    Wears glasses    Family history- Reviewed and unchanged Social history- Reviewed and unchanged   Review of Systems:  Review of Systems  Constitutional:  Negative for malaise/fatigue and weight loss.  HENT:  Negative for hearing loss and tinnitus.   Eyes:  Negative for blurred vision and double vision.  Respiratory:  Positive for cough (chronic). Negative for hemoptysis, sputum production, shortness of breath and wheezing.   Cardiovascular:  Negative for chest pain, palpitations, orthopnea, claudication and leg swelling.  Gastrointestinal:  Negative for abdominal pain, blood in stool, constipation, diarrhea, heartburn, melena, nausea and vomiting.  Genitourinary: Negative.   Musculoskeletal:  Negative for joint pain and myalgias.  Skin:  Negative for rash.  Neurological:  Positive for dizziness. Negative for tingling, sensory change, weakness and headaches.  Endo/Heme/Allergies:  Negative for polydipsia.  Psychiatric/Behavioral:  Negative for depression, hallucinations, memory loss, substance abuse and suicidal ideas. The patient has insomnia. The patient is not nervous/anxious.   All other systems reviewed and are negative.   Physical Exam: BP 120/76   Pulse 60   Temp 97.6 F (36.4 C)   Ht 5\' 9"  (1.753 m)   Wt 175 lb (79.4 kg)   SpO2 97%   BMI 25.84 kg/m  Wt Readings from Last 3 Encounters:  07/04/23 175 lb (79.4 kg)  05/26/23 171 lb 12.8 oz (77.9 kg)  04/27/23 170 lb 6.4 oz (77.3  kg)   General Appearance: Well nourished, in no apparent distress. Eyes: PERRLA, EOMs, conjunctiva no swelling or erythema Sinuses: No Frontal/maxillary tenderness ENT/Mouth: Ext aud canals clear, TMs without erythema, bulging. No erythema, swelling, or exudate on post pharynx.  Tonsils not swollen or erythematous. Hearing normal.  Neck: Supple, thyroid normal.  Respiratory: Respiratory effort normal, BS equal bilaterally without rales, rhonchi, wheezing or stridor. Does have dry cough Cardio: RRR with no MRGs. Brisk peripheral pulses without edema.  Abdomen: Soft, + BS.  Non tender, no guarding, rebound, hernias, masses. Lymphatics: Non tender without lymphadenopathy.  Musculoskeletal: Full ROM, 5/5 strength, Normal gait Skin: Warm, dry without rashes, lesions, ecchymosis.  Neuro: Cranial nerves intact. No cerebellar symptoms.  Psych: Awake and oriented X 3, normal affect, Insight and Judgment appropriate.    Adela Glimpse, NP 3:00 PM [facility]

## 2023-07-05 LAB — CBC WITH DIFFERENTIAL/PLATELET
Absolute Monocytes: 545 {cells}/uL (ref 200–950)
Basophils Absolute: 30 cells/uL (ref 0–200)
Basophils Relative: 0.6 %
Eosinophils Absolute: 40 {cells}/uL (ref 15–500)
Eosinophils Relative: 0.8 %
HCT: 40.3 % (ref 38.5–50.0)
Hemoglobin: 14.1 g/dL (ref 13.2–17.1)
Lymphs Abs: 1315 {cells}/uL (ref 850–3900)
MCH: 32 pg (ref 27.0–33.0)
MCHC: 35 g/dL (ref 32.0–36.0)
MCV: 91.6 fL (ref 80.0–100.0)
MPV: 9.8 fL (ref 7.5–12.5)
Monocytes Relative: 10.9 %
Neutro Abs: 3070 {cells}/uL (ref 1500–7800)
Neutrophils Relative %: 61.4 %
Platelets: 171 10*3/uL (ref 140–400)
RBC: 4.4 10*6/uL (ref 4.20–5.80)
RDW: 12.6 % (ref 11.0–15.0)
Total Lymphocyte: 26.3 %
WBC: 5 10*3/uL (ref 3.8–10.8)

## 2023-07-05 LAB — LIPID PANEL
Cholesterol: 163 mg/dL (ref <200)
HDL: 39 mg/dL — ABNORMAL LOW (ref >=40)
LDL Cholesterol (Calc): 86 mg/dL
Non-HDL Cholesterol (Calc): 124 mg/dL (ref <130)
Total CHOL/HDL Ratio: 4.2 (calc) (ref <5.0)
Triglycerides: 286 mg/dL — ABNORMAL HIGH (ref <150)

## 2023-07-05 LAB — COMPLETE METABOLIC PANEL WITH GFR
AG Ratio: 1.7 (calc) (ref 1.0–2.5)
ALT: 23 U/L (ref 9–46)
AST: 26 U/L (ref 10–35)
Albumin: 4.4 g/dL (ref 3.6–5.1)
Alkaline phosphatase (APISO): 52 U/L (ref 35–144)
BUN: 18 mg/dL (ref 7–25)
CO2: 30 mmol/L (ref 20–32)
Calcium: 8.9 mg/dL (ref 8.6–10.3)
Chloride: 102 mmol/L (ref 98–110)
Creat: 1.21 mg/dL (ref 0.70–1.35)
Globulin: 2.6 g/dL (ref 1.9–3.7)
Glucose, Bld: 105 mg/dL — ABNORMAL HIGH (ref 65–99)
Potassium: 3.8 mmol/L (ref 3.5–5.3)
Sodium: 139 mmol/L (ref 135–146)
Total Bilirubin: 0.4 mg/dL (ref 0.2–1.2)
Total Protein: 7 g/dL (ref 6.1–8.1)
eGFR: 67 mL/min/{1.73_m2} (ref >=60)

## 2023-07-05 LAB — HEMOGLOBIN A1C
Hgb A1c MFr Bld: 5.3 %{Hb} (ref <5.7)
Mean Plasma Glucose: 105 mg/dL
eAG (mmol/L): 5.8 mmol/L

## 2023-07-28 ENCOUNTER — Encounter: Payer: Self-pay | Admitting: Internal Medicine

## 2023-07-29 ENCOUNTER — Other Ambulatory Visit: Payer: Self-pay | Admitting: Internal Medicine

## 2023-07-29 NOTE — Progress Notes (Signed)
  [id number] SID: [id number] NAME: Jay Brooks GENDER: Male COLLECTED: 05/20/2023 REPORTED DATE: 07/27/2023 Laboratory Report PHYSICAL MEASUREMENTS HEIGHT: 5\' 8"  WEIGHT: 173 BODY MASS INDEX (BMI): 26 BLOOD PRES. 1: 114/72 BLOOD PRES. 2: 114/74 BLOOD PRES. 3: 116/74 AVG BLOOD PRES.: 115/73 PULSE AT REST: 68 CARDIAC PROFILE CHOLESTEROL Result: 173 mg/dL HIGH DENSITY LIPOPROTEIN(HDL) Result: 38.5 mg/dL LOW DENSITY LIPOPROTEIN (LDL) Result: 97 mg/dL TRIGLYCERIDES Result: 440 mg/dL Copyright 1027 All Rights Reserved [facility] [address], [place] [address] Ivery Quale, PhD [facility]: [facility] CLIA #[id number] CAP [phone] Slip ID: [id number] SID: [id number] NAME: Jay Brooks GENDER: Male COLLECTED: 05/20/2023 REPORTED DATE: 07/27/2023 Laboratory Report CHOLESTEROL/HDL RATIO Result: 4.49 CHEMISTRIES GLUCOSE Result: 94 mg/dL HEMOGLOBIN S0F Result: 5.2 % BLOOD UREA NITROGEN (BUN) Result: 19 mg/dL Copyright 0932 All Rights Reserved [facility] [address], [place] [address] Ivery Quale, PhD [facility] CLIA #[id number] CAP [phone] Slip ID: [id number] SID: [id number] NAME: Jay Brooks GENDER: Male COLLECTED: 05/20/2023 REPORTED DATE: 07/27/2023 Laboratory Report CREATININE Result: 1.20 mg/dL ALKALINE PHOSPHATASE Result: 60 U/L TOTAL BILIRUBIN Result: 0.27 mg/dL SGOT (AST) Result: 29 U/L Copyright 2024 All Rights Reserved [facility] [address], [place] [address] Ivery Quale, PhD [facility] CLIA #[id number] CAP [phone] Slip ID: [id number] SID: [id number] NAME: Jay Brooks GENDER: Male COLLECTED: 05/20/2023 REPORTED DATE: 07/27/2023 Laboratory Report SGPT (ALT) Result: 22 U/L GAMMA GLUTAMYLTRANSFERASE Result: 40 U/L TOTAL PROTEIN Result: 7.2 g/dL ALBUMIN Result: 4.6 g/dL GLOBULIN Result: 2.6 g/dL Copyright 5277 All Rights Reserved [facility] [address], [place] [address] Ivery Quale, PhD [facility] CLIA #[id number] CAP [phone] Slip ID: [id number] SID: [id number] NAME: Jay Brooks GENDER: Male COLLECTED: 05/20/2023 REPORTED DATE: 07/27/2023 Laboratory Report SEROLOGY NT-PROBNP Result: 39 pg/mL URINALYSIS URINE TEMPERATURE Result: 97.0 F URN SPECIFIC GRAVITY Result: 1.020 URN CREATININE Result: 83.0 mg% Copyright 2024 All Rights Reserved [facility] [address], [place] [address] Ivery Quale, PhD [facility] CLIA #[id number] CAP [phone] Slip ID: [id number] SID: [id number] NAME: Jay Brooks GENDER: Male COLLECTED: 05/20/2023 REPORTED DATE: 07/27/2023 Laboratory Report URN GLUCOSE Result: 0.00 g/dL URN TOTAL PROTEIN Result: 0.0 mg/dL URN PROTEIN/CREATININE Result: 0.00 g/gCREA URN RED BLOOD COUNT Result: 0 HPF URN WHITE BLOOD COUNT Result: 0 HPF Copyright 2024 All Rights Reserved [facility] [address], [place] [address] Ivery Quale, PhD [facility] CLIA #[id number] CAP [phone] Slip ID: [id number] SID: [id number] NAME: Jay Brooks GENDER: Male COLLECTED: 05/20/2023 REPORTED DATE: 07/27/2023 Laboratory Report URN HYALINE CASTS Result: 0 LPF URN GRANULAR CASTS Result: 0 LPF URN BLOOD Result: Negative NICOTINE METABOLITES, URN Result: Negative SERUM ANTIGENS PANEL PROSTATE SPECIFIC ANTIGEN Result: 0.99 ng/mL Copyright 2024 All Rights Reserved [facility] [address], [place] [address] Ivery Quale, PhD [facility] CLIA [phone] CAP [phone]

## 2023-08-31 NOTE — Progress Notes (Deleted)
Assessment and Plan:  Jay Brooks was seen today for acute visit.  Diagnoses and all orders for this visit:  Chronic cough Improved with pulmicort, singulair and prn albuterol- currently controlled Continue to monitor  Essential hypertension - continue medications, DASH diet, exercise and monitor at home. Call if greater than 130/80.   Mild intermittent asthma without complication Continue meds Currently well controlled Monitor symptoms -     PFT PULM FXN SPIROMETRY (94010)       Further disposition pending results of labs. Discussed med's effects and SE's.   Over 30 minutes of exam, counseling, chart review, and critical decision making was performed.   Future Appointments  Date Time Provider Department Center  09/01/2023 11:30 AM Jay Dick, NP [facility] None  09/13/2023  1:50 PM Mansouraty, Netty Starring., MD [facility]  10/10/2023  2:00 PM Jay Glimpse, NP [facility] None  01/15/2024  2:30 PM Cranford, Archie Patten, NP [facility] None    ------------------------------------------------------------------------------------------------------------------   HPI There were no vitals taken for this visit.  62 y.o.male presents for pulmonary function testing for chronic cough. 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 Cough is currently controlled with singulair, pulmicort and uses albuterol as rescue..    BP is well with Ziac 2.5/6.25 mg every day.  Denies headaches, chest pain, shortness of breath and dizziness.  BP Readings from Last 3 Encounters:  07/04/23 120/76  05/26/23 112/70  04/27/23 122/74    BMI is There is no height or weight on file to calculate BMI., he has been working on diet and exercise. Wt Readings from Last 3 Encounters:  07/04/23 175 lb (79.4 kg)  05/26/23 171 lb 12.8 oz (77.9 kg)  04/27/23 170 lb 6.4 oz (77.3 kg)    Past Medical History:  Diagnosis Date   Allergy    Chronic cough    Chronic sinusitis    Diverticulitis  2018   Mild   Diverticulosis of colon (without mention of hemorrhage)    Early satiety    Esophageal motility disorder 12/2018   Family history of adverse reaction to anesthesia    mom has PONV   GERD (gastroesophageal reflux disease)    History of kidney stones    2017   History of left inguinal hernia    Hypertension    Insomnia    Lyme disease    QUESTIONABLE   Pulmonary nodules 10/2018   Scattered tiny subpleural pulmonary nodules at the peripheral right lung base, largest 3 mm   Right inguinal hernia 2017   Small fat containing right inguinal hernia   Sleep apnea    no CPAP   Sleep deficient    Testosterone deficiency    Vitamin D deficiency    Wears glasses      Allergies  Allergen Reactions   Allegra [Fexofenadine] Other (See Comments)    Worsened insomnia   Benzoyl Peroxide     blisters   Decadron [Dexamethasone]     Reflux & Hiccups, GI upset   Doxycycline Nausea Only   Levaquin [Levofloxacin] Nausea And Vomiting    Patient states that after he took levaquin this past time he had nausea with vomiting    Current Outpatient Medications on File Prior to Visit  Medication Sig   albuterol (VENTOLIN HFA) 108 (90 Base) MCG/ACT inhaler Inhale into the lungs every 6 (six) hours as needed for wheezing or shortness of breath.   Azelastine HCl 137 MCG/SPRAY SOLN SMARTSIG:2 Spray(s) Both Nares Every Evening (Patient not taking: Reported  on 07/04/2023)   bisoprolol-hydrochlorothiazide (ZIAC) 2.5-6.25 MG tablet Take  1 tablet  Daily  for BP                                                                 /                                                                   TAKE                                         BY                                                 MOUTH   calcium carbonate (TUMS EX) 750 MG chewable tablet Chew 1 tablet by mouth daily.   cholecalciferol (VITAMIN D) 25 MCG (1000 UNIT) tablet Take 1,000 Units by mouth daily. 10,000 units   clindamycin (CLEOCIN T)  1 % lotion Apply 1 application topically every evening.   cycloSPORINE (RESTASIS OP) Apply to eye 2 (two) times daily.   famotidine (PEPCID) 20 MG tablet Take 20 mg by mouth daily.   fluticasone (FLONASE) 50 MCG/ACT nasal spray Place 2 sprays into both nostrils daily.    hydrocortisone cream 1 % Apply 1 application topically daily as needed for itching.   loratadine (CLARITIN) 10 MG tablet Take 10 mg by mouth daily.   montelukast (SINGULAIR) 10 MG tablet TAKE 1 TABLET BY MOUTH DAILY FOR ALLERGIES   Olopatadine HCl 0.2 % SOLN Place 1 drop into both eyes daily as needed.   PULMICORT FLEXHALER 180 MCG/ACT inhaler Inhale 1 puff into the lungs 2 (two) times daily.   RABEprazole (ACIPHEX) 20 MG tablet TAKE 1 TABLET BY MOUTH TWICE A DAY   tamsulosin (FLOMAX) 0.4 MG CAPS capsule TAKE 1 CAPSULE AT BEDTIME FOR PROSTATE   triamcinolone cream (KENALOG) 0.1 % Apply 1 application topically 2 (two) times daily as needed for itching.   Vibegron (GEMTESA) 75 MG TABS Take by mouth daily.   Zinc 50 MG TABS Take 1 tablet Daily   No current facility-administered medications on file prior to visit.    ROS: all negative except above.   Physical Exam:  There were no vitals taken for this visit.  General Appearance: Well nourished, in no apparent distress. Eyes: PERRLA, EOMs, conjunctiva no swelling or erythema Neck: Supple, thyroid normal.  Respiratory: Respiratory effort normal, BS equal bilaterally without rales, rhonchi, wheezing or stridor.  Cardio: RRR with no MRGs. Brisk peripheral pulses without edema.  Abdomen: Soft, + BS.  Non tender, no guarding, rebound, hernias, masses. Lymphatics: Non tender without lymphadenopathy.  Musculoskeletal: Full ROM, 5/5 strength, normal gait.  Skin: Warm, dry without rashes, lesions, ecchymosis.  Neuro: Cranial nerves intact. Normal muscle tone, no cerebellar symptoms. Sensation  intact.  Psych: Awake and oriented X 3, normal affect, Insight and Judgment appropriate.    Pulmonary Functions Testing Results:   PEF 95% FEV1 95%        Jay Schrier Hollie Salk, NP 3:05 PM [facility]

## 2023-09-01 ENCOUNTER — Ambulatory Visit: Payer: Commercial Managed Care - PPO | Admitting: Nurse Practitioner

## 2023-09-01 DIAGNOSIS — R053 Chronic cough: Secondary | ICD-10-CM

## 2023-09-01 DIAGNOSIS — I10 Essential (primary) hypertension: Secondary | ICD-10-CM

## 2023-09-01 DIAGNOSIS — J4520 Mild intermittent asthma, uncomplicated: Secondary | ICD-10-CM

## 2023-09-05 ENCOUNTER — Encounter: Payer: Self-pay | Admitting: Nurse Practitioner

## 2023-09-05 ENCOUNTER — Other Ambulatory Visit: Payer: Self-pay | Admitting: Gastroenterology

## 2023-09-05 ENCOUNTER — Ambulatory Visit (INDEPENDENT_AMBULATORY_CARE_PROVIDER_SITE_OTHER): Payer: Commercial Managed Care - PPO | Admitting: Nurse Practitioner

## 2023-09-05 VITALS — BP 114/72 | HR 60 | Temp 97.5°F | Ht 69.0 in | Wt 173.4 lb

## 2023-09-05 DIAGNOSIS — R053 Chronic cough: Secondary | ICD-10-CM

## 2023-09-05 DIAGNOSIS — Z23 Encounter for immunization: Secondary | ICD-10-CM | POA: Diagnosis not present

## 2023-09-05 DIAGNOSIS — I10 Essential (primary) hypertension: Secondary | ICD-10-CM

## 2023-09-05 NOTE — Progress Notes (Signed)
Assessment and Plan:  Julienne was seen today for acute visit.  Diagnoses and all orders for this visit:  Essential hypertension - well controlled on Ziac 2.5/6.25 mg every day  - continue medications, DASH diet, exercise and monitor at home. Call if greater than 130/80.    Chronic cough Well controlled on current medications Continue meds and monitor symptoms  Flu vaccine need -     Fluzone Trivalent Flu Vaccine (Muli dose preparattion)       Further disposition pending results of labs. Discussed med's effects and SE's.   Over 30 minutes of exam, counseling, chart review, and critical decision making was performed.   Future Appointments  Date Time Provider Department Center  09/13/2023  1:50 PM Mansouraty, Netty Starring., MD [facility]  10/10/2023  2:00 PM Adela Glimpse, NP [facility] None  01/15/2024  2:30 PM Cranford, Archie Patten, NP [facility] None    ------------------------------------------------------------------------------------------------------------------   HPI BP 114/72   Pulse 60   Temp (!) 97.5 F (36.4 C)   Ht 5\' 9"  (1.753 m)   Wt 173 lb 6.4 oz (78.7 kg)   SpO2 98%   BMI 25.61 kg/m   62 y.o.male presents for BP evaluation and needing a form filled out for work.   BP is currently well controlled on ziac 2.5/6.25 mg every day  BP Readings from Last 10 Encounters:  09/05/23 114/72  07/04/23 120/76  05/26/23 112/70  04/27/23 122/74  03/21/23 118/70  01/25/23 118/76  12/21/22 120/68  12/19/22 122/78  11/25/22 110/70  11/02/22 118/80  Denies headaches, chest pain, shortness of breath and dizziness  BMI is Body mass index is 25.61 kg/m., he has been working on diet and exercise. Wt Readings from Last 3 Encounters:  09/05/23 173 lb 6.4 oz (78.7 kg)  07/04/23 175 lb (79.4 kg)  05/26/23 171 lb 12.8 oz (77.9 kg)   His cough is currently non existent- controlled on Pulmicort, pepcid, Flonase, singulair, azelastine and aciphex daily. Uses Albuterol  inhaler PRN- has not had to use recently.   Past Medical History:  Diagnosis Date   Allergy    Chronic cough    Chronic sinusitis    Diverticulitis 2018   Mild   Diverticulosis of colon (without mention of hemorrhage)    Early satiety    Esophageal motility disorder 12/2018   Family history of adverse reaction to anesthesia    mom has PONV   GERD (gastroesophageal reflux disease)    History of kidney stones    2017   History of left inguinal hernia    Hypertension    Insomnia    Lyme disease    QUESTIONABLE   Pulmonary nodules 10/2018   Scattered tiny subpleural pulmonary nodules at the peripheral right lung base, largest 3 mm   Right inguinal hernia 2017   Small fat containing right inguinal hernia   Sleep apnea    no CPAP   Sleep deficient    Testosterone deficiency    Vitamin D deficiency    Wears glasses      Allergies  Allergen Reactions   Allegra [Fexofenadine] Other (See Comments)    Worsened insomnia   Benzoyl Peroxide     blisters   Decadron [Dexamethasone]     Reflux & Hiccups, GI upset   Doxycycline Nausea Only   Levaquin [Levofloxacin] Nausea And Vomiting    Patient states that after he took levaquin this past time he had nausea with vomiting    Current Outpatient Medications on File Prior  to Visit  Medication Sig   albuterol (VENTOLIN HFA) 108 (90 Base) MCG/ACT inhaler Inhale into the lungs every 6 (six) hours as needed for wheezing or shortness of breath.   Azelastine HCl 137 MCG/SPRAY SOLN    bisoprolol-hydrochlorothiazide (ZIAC) 2.5-6.25 MG tablet Take  1 tablet  Daily  for BP                                                                 /                                                                   TAKE                                         BY                                                 MOUTH   calcium carbonate (TUMS EX) 750 MG chewable tablet Chew 1 tablet by mouth daily.   cholecalciferol (VITAMIN D) 25 MCG (1000 UNIT) tablet Take  1,000 Units by mouth daily. 10,000 units   clindamycin (CLEOCIN T) 1 % lotion Apply 1 application topically every evening.   cycloSPORINE (RESTASIS OP) Apply to eye 2 (two) times daily.   famotidine (PEPCID) 20 MG tablet Take 20 mg by mouth daily.   fluticasone (FLONASE) 50 MCG/ACT nasal spray Place 2 sprays into both nostrils daily.    hydrocortisone cream 1 % Apply 1 application topically daily as needed for itching.   loratadine (CLARITIN) 10 MG tablet Take 10 mg by mouth daily.   montelukast (SINGULAIR) 10 MG tablet TAKE 1 TABLET BY MOUTH DAILY FOR ALLERGIES   Olopatadine HCl 0.2 % SOLN Place 1 drop into both eyes daily as needed.   PULMICORT FLEXHALER 180 MCG/ACT inhaler Inhale 1 puff into the lungs 2 (two) times daily.   RABEprazole (ACIPHEX) 20 MG tablet TAKE 1 TABLET BY MOUTH TWICE A DAY   sucralfate (CARAFATE) 1 g tablet Take 1 g by mouth 4 (four) times daily. PRN   tamsulosin (FLOMAX) 0.4 MG CAPS capsule TAKE 1 CAPSULE AT BEDTIME FOR PROSTATE   triamcinolone cream (KENALOG) 0.1 % Apply 1 application topically 2 (two) times daily as needed for itching.   Vibegron (GEMTESA) 75 MG TABS Take by mouth daily.   Zinc 50 MG TABS Take 1 tablet Daily   No current facility-administered medications on file prior to visit.    ROS: all negative except above.   Physical Exam:  BP 114/72   Pulse 60   Temp (!) 97.5 F (36.4 C)   Ht 5\' 9"  (1.753 m)   Wt 173 lb 6.4 oz (78.7 kg)   SpO2 98%   BMI 25.61 kg/m   General Appearance: Well nourished, in  no apparent distress. Eyes: PERRLA, EOMs, conjunctiva no swelling or erythema Sinuses: No Frontal/maxillary tenderness ENT/Mouth: Ext aud canals clear, TMs without erythema, bulging. No erythema, swelling, or exudate on post pharynx.  Hearing normal.  Neck: Supple, thyroid normal.  Respiratory: Respiratory effort normal, BS equal bilaterally without rales, rhonchi, wheezing or stridor.  Cardio: RRR with no MRGs. Brisk peripheral pulses without  edema.  Abdomen: Soft, + BS.   Lymphatics: Non tender without lymphadenopathy.  Musculoskeletal: Full ROM, 5/5 strength, normal gait.  Skin: Warm, dry without rashes, lesions, ecchymosis.  Neuro: Cranial nerves intact. Normal muscle tone, no cerebellar symptoms. Sensation intact.  Psych: Awake and oriented X 3, normal affect, Insight and Judgment appropriate.     Raynelle Dick, NP 3:41 PM [facility]

## 2023-09-05 NOTE — Patient Instructions (Signed)
Hypertension, Adult High blood pressure (hypertension) is when the force of blood pumping through the arteries is too strong. The arteries are the blood vessels that carry blood from the heart throughout the body. Hypertension forces the heart to work harder to pump blood and may cause arteries to become narrow or stiff. Untreated or uncontrolled hypertension can lead to a heart attack, heart failure, a stroke, kidney disease, and other problems. A blood pressure reading consists of a higher number over a lower number. Ideally, your blood pressure should be below 120/80. The first ("top") number is called the systolic pressure. It is a measure of the pressure in your arteries as your heart beats. The second ("bottom") number is called the diastolic pressure. It is a measure of the pressure in your arteries as the heart relaxes. What are the causes? The exact cause of this condition is not known. There are some conditions that result in high blood pressure. What increases the risk? Certain factors may make you more likely to develop high blood pressure. Some of these risk factors are under your control, including: Smoking. Not getting enough exercise or physical activity. Being overweight. Having too much fat, sugar, calories, or salt (sodium) in your diet. Drinking too much alcohol. Other risk factors include: Having a personal history of heart disease, diabetes, high cholesterol, or kidney disease. Stress. Having a family history of high blood pressure and high cholesterol. Having obstructive sleep apnea. Age. The risk increases with age. What are the signs or symptoms? High blood pressure may not cause symptoms. Very high blood pressure (hypertensive crisis) may cause: Headache. Fast or irregular heartbeats (palpitations). Shortness of breath. Nosebleed. Nausea and vomiting. Vision changes. Severe chest pain, dizziness, and seizures. How is this diagnosed? This condition is diagnosed by  measuring your blood pressure while you are seated, with your arm resting on a flat surface, your legs uncrossed, and your feet flat on the floor. The cuff of the blood pressure monitor will be placed directly against the skin of your upper arm at the level of your heart. Blood pressure should be measured at least twice using the same arm. Certain conditions can cause a difference in blood pressure between your right and left arms. If you have a high blood pressure reading during one visit or you have normal blood pressure with other risk factors, you may be asked to: Return on a different day to have your blood pressure checked again. Monitor your blood pressure at home for 1 week or longer. If you are diagnosed with hypertension, you may have other blood or imaging tests to help your health care provider understand your overall risk for other conditions. How is this treated? This condition is treated by making healthy lifestyle changes, such as eating healthy foods, exercising more, and reducing your alcohol intake. You may be referred for counseling on a healthy diet and physical activity. Your health care provider may prescribe medicine if lifestyle changes are not enough to get your blood pressure under control and if: Your systolic blood pressure is above 130. Your diastolic blood pressure is above 80. Your personal target blood pressure may vary depending on your medical conditions, your age, and other factors. Follow these instructions at home: Eating and drinking  Eat a diet that is high in fiber and potassium, and low in sodium, added sugar, and fat. An example of this eating plan is called the DASH diet. DASH stands for Dietary Approaches to Stop Hypertension. To eat this way: Eat   plenty of fresh fruits and vegetables. Try to fill one half of your plate at each meal with fruits and vegetables. Eat whole grains, such as whole-wheat pasta, brown rice, or whole-grain bread. Fill about one  fourth of your plate with whole grains. Eat or drink low-fat dairy products, such as skim milk or low-fat yogurt. Avoid fatty cuts of meat, processed or cured meats, and poultry with skin. Fill about one fourth of your plate with lean proteins, such as fish, chicken without skin, beans, eggs, or tofu. Avoid pre-made and processed foods. These tend to be higher in sodium, added sugar, and fat. Reduce your daily sodium intake. Many people with hypertension should eat less than 1,500 mg of sodium a day. Do not drink alcohol if: Your health care provider tells you not to drink. You are pregnant, may be pregnant, or are planning to become pregnant. If you drink alcohol: Limit how much you have to: 0-1 drink a day for women. 0-2 drinks a day for men. Know how much alcohol is in your drink. In the [place] one drink equals one 12 oz bottle of beer (355 mL), one 5 oz glass of wine (148 mL), or one 1 oz glass of hard liquor (44 mL). Lifestyle  Work with your health care provider to maintain a healthy body weight or to lose weight. Ask what an ideal weight is for you. Get at least 30 minutes of exercise that causes your heart to beat faster (aerobic exercise) most days of the week. Activities may include walking, swimming, or biking. Include exercise to strengthen your muscles (resistance exercise), such as Pilates or lifting weights, as part of your weekly exercise routine. Try to do these types of exercises for 30 minutes at least 3 days a week. Do not use any products that contain nicotine or tobacco. These products include cigarettes, chewing tobacco, and vaping devices, such as e-cigarettes. If you need help quitting, ask your health care provider. Monitor your blood pressure at home as told by your health care provider. Keep all follow-up visits. This is important. Medicines Take over-the-counter and prescription medicines only as told by your health care provider. Follow directions carefully. Blood  pressure medicines must be taken as prescribed. Do not skip doses of blood pressure medicine. Doing this puts you at risk for problems and can make the medicine less effective. Ask your health care provider about side effects or reactions to medicines that you should watch for. Contact a health care provider if you: Think you are having a reaction to a medicine you are taking. Have headaches that keep coming back (recurring). Feel dizzy. Have swelling in your ankles. Have trouble with your vision. Get help right away if you: Develop a severe headache or confusion. Have unusual weakness or numbness. Feel faint. Have severe pain in your chest or abdomen. Vomit repeatedly. Have trouble breathing. These symptoms may be an emergency. Get help right away. Call 911. Do not wait to see if the symptoms will go away. Do not drive yourself to the hospital. Summary Hypertension is when the force of blood pumping through your arteries is too strong. If this condition is not controlled, it may put you at risk for serious complications. Your personal target blood pressure may vary depending on your medical conditions, your age, and other factors. For most people, a normal blood pressure is less than 120/80. Hypertension is treated with lifestyle changes, medicines, or a combination of both. Lifestyle changes include losing weight, eating a healthy,   low-sodium diet, exercising more, and limiting alcohol. This information is not intended to replace advice given to you by your health care provider. Make sure you discuss any questions you have with your health care provider. Document Revised: 08/31/2021 Document Reviewed: 08/31/2021 Elsevier Patient Education  2024 [employer]vier Inc.  

## 2023-09-12 NOTE — Progress Notes (Unsigned)
Assessment and Plan:  Jay Brooks was seen today for acute visit.  Diagnoses and all orders for this visit:  Chronic cough Improved with pulmicort, singulair and prn albuterol- currently controlled PFT normal Continue to monitor  Essential hypertension - continue medications, DASH diet, exercise and monitor at home. Call if greater than 130/80.   Mild intermittent asthma without complication Continue meds Currently well controlled Monitor symptoms Pulmonary function test normal  -     PFT PULM FXN SPIROMETRY (94010)       Further disposition pending results of labs. Discussed med's effects and SE's.   Over 30 minutes of exam, counseling, chart review, and critical decision making was performed.   Future Appointments  Date Time Provider Department Center  09/13/2023  1:50 PM Mansouraty, Netty Starring., MD [facility]  10/10/2023  2:00 PM Adela Glimpse, NP [facility] None  01/15/2024  2:30 PM Cranford, Archie Patten, NP [facility] None    ------------------------------------------------------------------------------------------------------------------   HPI BP 126/72   Pulse (!) 57   Temp (!) 97.3 F (36.3 C)   Ht 5\' 9"  (1.753 m)   Wt 172 lb 9.6 oz (78.3 kg)   SpO2 98%   BMI 25.49 kg/m   62 y.o.male presents for pulmonary function testing for chronic cough. 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 Cough is currently controlled with singulair, pulmicort and uses albuterol as rescue..    BP is well with Ziac 2.5/6.25 mg every day.  Denies headaches, chest pain, shortness of breath and dizziness.  BP Readings from Last 3 Encounters:  09/13/23 126/72  09/05/23 114/72  07/04/23 120/76    BMI is Body mass index is 25.49 kg/m., he has been working on diet and exercise. Wt Readings from Last 3 Encounters:  09/13/23 172 lb 9.6 oz (78.3 kg)  09/05/23 173 lb 6.4 oz (78.7 kg)  07/04/23 175 lb (79.4 kg)    Past Medical History:  Diagnosis Date   Allergy     Chronic cough    Chronic sinusitis    Diverticulitis 2018   Mild   Diverticulosis of colon (without mention of hemorrhage)    Early satiety    Esophageal motility disorder 12/2018   Family history of adverse reaction to anesthesia    mom has PONV   GERD (gastroesophageal reflux disease)    History of kidney stones    2017   History of left inguinal hernia    Hypertension    Insomnia    Lyme disease    QUESTIONABLE   Pulmonary nodules 10/2018   Scattered tiny subpleural pulmonary nodules at the peripheral right lung base, largest 3 mm   Right inguinal hernia 2017   Small fat containing right inguinal hernia   Sleep apnea    no CPAP   Sleep deficient    Testosterone deficiency    Vitamin D deficiency    Wears glasses      Allergies  Allergen Reactions   Allegra [Fexofenadine] Other (See Comments)    Worsened insomnia   Benzoyl Peroxide     blisters   Decadron [Dexamethasone]     Reflux & Hiccups, GI upset   Doxycycline Nausea Only   Levaquin [Levofloxacin] Nausea And Vomiting    Patient states that after he took levaquin this past time he had nausea with vomiting    Current Outpatient Medications on File Prior to Visit  Medication Sig   albuterol (VENTOLIN HFA) 108 (90 Base) MCG/ACT inhaler Inhale into the lungs every 6 (six) hours  as needed for wheezing or shortness of breath.   Azelastine HCl 137 MCG/SPRAY SOLN    bisoprolol-hydrochlorothiazide (ZIAC) 2.5-6.25 MG tablet Take  1 tablet  Daily  for BP                                                                 /                                                                   TAKE                                         BY                                                 MOUTH   calcium carbonate (TUMS EX) 750 MG chewable tablet Chew 1 tablet by mouth daily.   cholecalciferol (VITAMIN D) 25 MCG (1000 UNIT) tablet Take 1,000 Units by mouth daily. 10,000 units   clindamycin (CLEOCIN T) 1 % lotion Apply 1 application  topically every evening.   cycloSPORINE (RESTASIS OP) Apply to eye 2 (two) times daily.   famotidine (PEPCID) 20 MG tablet Take 20 mg by mouth daily.   fluticasone (FLONASE) 50 MCG/ACT nasal spray Place 2 sprays into both nostrils daily.    hydrocortisone cream 1 % Apply 1 application topically daily as needed for itching.   loratadine (CLARITIN) 10 MG tablet Take 10 mg by mouth daily.   montelukast (SINGULAIR) 10 MG tablet TAKE 1 TABLET BY MOUTH DAILY FOR ALLERGIES   Olopatadine HCl 0.2 % SOLN Place 1 drop into both eyes daily as needed.   PULMICORT FLEXHALER 180 MCG/ACT inhaler Inhale 1 puff into the lungs 2 (two) times daily.   RABEprazole (ACIPHEX) 20 MG tablet TAKE 1 TABLET BY MOUTH TWICE A DAY   sucralfate (CARAFATE) 1 g tablet Take 1 g by mouth 4 (four) times daily. PRN   tamsulosin (FLOMAX) 0.4 MG CAPS capsule TAKE 1 CAPSULE AT BEDTIME FOR PROSTATE   triamcinolone cream (KENALOG) 0.1 % Apply 1 application topically 2 (two) times daily as needed for itching.   Vibegron (GEMTESA) 75 MG TABS Take by mouth daily.   Zinc 50 MG TABS Take 1 tablet Daily   No current facility-administered medications on file prior to visit.    ROS: all negative except above.   Physical Exam:  BP 126/72   Pulse (!) 57   Temp (!) 97.3 F (36.3 C)   Ht 5\' 9"  (1.753 m)   Wt 172 lb 9.6 oz (78.3 kg)   SpO2 98%   BMI 25.49 kg/m   General Appearance: Well nourished, in no apparent distress. Eyes: PERRLA, EOMs, conjunctiva no swelling or erythema Neck: Supple, thyroid normal.  Respiratory: Respiratory effort normal, BS equal  bilaterally without rales, rhonchi, wheezing or stridor.  Cardio: RRR with no MRGs. Brisk peripheral pulses without edema.  Abdomen: Soft, + BS.  Non tender, no guarding, rebound, hernias, masses. Lymphatics: Non tender without lymphadenopathy.  Musculoskeletal: Full ROM, 5/5 strength, normal gait.  Skin: Warm, dry without rashes, lesions, ecchymosis.  Neuro: Cranial nerves  intact. Normal muscle tone, no cerebellar symptoms. Sensation intact.  Psych: Awake and oriented X 3, normal affect, Insight and Judgment appropriate.   Pulmonary Functions Testing Results:   FEV1/FVC 98% normal        Raynelle Dick, NP 12:05 PM [facility]

## 2023-09-13 ENCOUNTER — Ambulatory Visit (INDEPENDENT_AMBULATORY_CARE_PROVIDER_SITE_OTHER): Payer: Commercial Managed Care - PPO | Admitting: Nurse Practitioner

## 2023-09-13 ENCOUNTER — Encounter: Payer: Self-pay | Admitting: Nurse Practitioner

## 2023-09-13 ENCOUNTER — Ambulatory Visit: Payer: Commercial Managed Care - PPO | Admitting: Gastroenterology

## 2023-09-13 ENCOUNTER — Encounter: Payer: Self-pay | Admitting: Gastroenterology

## 2023-09-13 VITALS — BP 126/72 | HR 57 | Temp 97.3°F | Ht 69.0 in | Wt 172.6 lb

## 2023-09-13 VITALS — BP 110/70 | HR 68 | Ht 68.0 in | Wt 172.4 lb

## 2023-09-13 DIAGNOSIS — K649 Unspecified hemorrhoids: Secondary | ICD-10-CM

## 2023-09-13 DIAGNOSIS — R1013 Epigastric pain: Secondary | ICD-10-CM

## 2023-09-13 DIAGNOSIS — R053 Chronic cough: Secondary | ICD-10-CM | POA: Diagnosis not present

## 2023-09-13 DIAGNOSIS — R12 Heartburn: Secondary | ICD-10-CM

## 2023-09-13 DIAGNOSIS — I10 Essential (primary) hypertension: Secondary | ICD-10-CM

## 2023-09-13 MED ORDER — SUCRALFATE 1 G PO TABS: 1.0000 g | ORAL_TABLET | Freq: Four times a day (QID) | ORAL | 4 refills | Status: DC

## 2023-09-13 MED ORDER — RABEPRAZOLE SODIUM 20 MG PO TBEC: 20.0000 mg | DELAYED_RELEASE_TABLET | Freq: Two times a day (BID) | ORAL | 4 refills | Status: DC

## 2023-09-13 NOTE — Patient Instructions (Addendum)
We have sent the following medications to your pharmacy for you to pick up at your convenience: Aciphex, Carafate   You have been scheduled for an abdominal ultrasound at [facility] (1st floor of hospital) on 09/21/23 at 9:00 am . Please arrive 30 minutes prior to your appointment for registration. Make certain not to have anything to eat or drink 6 hours prior to your appointment. Should you need to reschedule your appointment, please contact radiology at [phone]. This test typically takes about 30 minutes to perform.  Follow-up in April/May 2025. Office will contact you to schedule   _______________________________________________________  If your blood pressure at your visit was 140/90 or greater, please contact your primary care physician to follow up on this.  _______________________________________________________  If you are age 55 or older, your body mass index should be between 23-30. Your Body mass index is 26.21 kg/m. If this is out of the aforementioned range listed, please consider follow up with your Primary Care Provider.  If you are age 16 or younger, your body mass index should be between 19-25. Your Body mass index is 26.21 kg/m. If this is out of the aformentioned range listed, please consider follow up with your Primary Care Provider.   ________________________________________________________  The [facility] providers would like to encourage you to use [facility] to communicate with providers for non-urgent requests or questions.  Due to long hold times on the telephone, sending your provider a message by [facility] may be a faster and more efficient way to get a response.  Please allow 48 business hours for a response.  Please remember that this is for non-urgent requests.  _______________________________________________________  Thank you for choosing me and [facility].  Dr. Meridee Score

## 2023-09-13 NOTE — Progress Notes (Signed)
GASTROENTEROLOGY OUTPATIENT CLINIC VISIT   Primary Care Provider Lucky Cowboy, MD [address] [place] [address] [phone]  Patient Profile: Jay Brooks is a 62 y.o. male with a pmh significant for hypertension, nephrolithiasis, OSA, chronic cough, GERD (though Bravo testing suggested less likely true acid related), diverticulosis, hemorrhoids.  The patient presents to the [facility] for an evaluation and management of problem(s) noted below:  Problem List 1. Pyrosis   2. Epigastric pain   3. Hemorrhoids, unspecified hemorrhoid type    Discussed the use of AI scribe software for clinical note transcription with the patient, who gave verbal consent to proceed.  History of Present Illness Please see prior notes for full details of HPI.  Interval History The patient presents for follow-up.  When last seen in clinic, the patient was dealing with ongoing rectal discomfort and irritation without bleeding.  We had recommended intermittent suppository use of Calmol-4 and Anusol as well as continued Preparation H.  Overall, he has been stable with his symptoms and uses suppositories only a few times a month.  He would like to have less irritation as he did when he was using suppository therapy more frequently though.  He has rare pyrosis symptoms breakthrough on his current regimen.  But at times, the pyrosis can be severe when it occurs.  He reports an episode of nocturnal heartburn last week, which was difficult to manage. The patient still uses Carafate intermittently, particularly when they experience a sensation of 'rumbling' in their stomach. They report taking Carafate about three times a day for two to three days each 1-2 weeks to help with symptoms.  Continuing PPI and Pepcid is not enough for the uneasiness that he has during these times.  TUMS also has to be used as well.  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   GI Review of Systems Positive as above Negative for odynophagia, dysphagia, melena, hematochezia   Review of Systems General: Denies fevers/chills/weight loss unintentionally Cardiovascular: Denies chest pain Pulmonary: Denies shortness of breath Gastroenterological: See HPI Genitourinary: Denies darkened urine Hematological: Denies easy bruising/bleeding Dermatological: Denies jaundice Psychological: Mood is stable   Medications Current Outpatient Medications  Medication Sig Dispense Refill   albuterol (VENTOLIN HFA) 108 (90 Base) MCG/ACT inhaler Inhale into the lungs every 6 (six) hours as needed for wheezing or shortness of breath.     Azelastine HCl 137 MCG/SPRAY SOLN      bisoprolol-hydrochlorothiazide (ZIAC) 2.5-6.25 MG tablet Take  1 tablet  Daily  for BP                                                                 /                                                                   TAKE  BY                                                 MOUTH 90 tablet 3   calcium carbonate (TUMS EX) 750 MG chewable tablet Chew 1 tablet by mouth daily.     cholecalciferol (VITAMIN D) 25 MCG (1000 UNIT) tablet Take 1,000 Units by mouth daily. 10,000 units     clindamycin (CLEOCIN T) 1 % lotion Apply 1 application topically every evening.     cycloSPORINE (RESTASIS OP) Apply to eye 2 (two) times daily.     famotidine (PEPCID) 20 MG tablet Take 20 mg by mouth daily.     fluticasone (FLONASE) 50 MCG/ACT nasal spray Place 2 sprays into both nostrils daily.      hydrocortisone cream 1 % Apply 1 application topically daily as needed for itching.     loratadine (CLARITIN) 10 MG tablet Take 10 mg by mouth daily.     montelukast (SINGULAIR) 10 MG tablet TAKE 1 TABLET BY MOUTH DAILY FOR ALLERGIES 90 tablet 2   Olopatadine HCl  0.2 % SOLN Place 1 drop into both eyes daily as needed.     PULMICORT FLEXHALER 180 MCG/ACT inhaler Inhale 1 puff into the lungs 2 (two) times daily.     tamsulosin (FLOMAX) 0.4 MG CAPS capsule TAKE 1 CAPSULE AT BEDTIME FOR PROSTATE 90 capsule 2   triamcinolone cream (KENALOG) 0.1 % Apply 1 application topically 2 (two) times daily as needed for itching.     Vibegron (GEMTESA) 75 MG TABS Take by mouth daily.     Zinc 50 MG TABS Take 1 tablet Daily  0   RABEprazole (ACIPHEX) 20 MG tablet Take 1 tablet (20 mg total) by mouth 2 (two) times daily. 180 tablet 4   sucralfate (CARAFATE) 1 g tablet Take 1 tablet (1 g total) by mouth 4 (four) times daily. PRN 180 tablet 4   No current facility-administered medications for this visit.    Allergies Allergies  Allergen Reactions   Allegra [Fexofenadine] Other (See Comments)    Worsened insomnia   Benzoyl Peroxide     blisters   Decadron [Dexamethasone]     Reflux & Hiccups, GI upset   Doxycycline Nausea Only   Levaquin [Levofloxacin] Nausea And Vomiting    Patient states that after he took levaquin this past time he had nausea with vomiting    Histories Past Medical History:  Diagnosis Date   Allergy    Chronic cough    Chronic sinusitis    Diverticulitis 2018   Mild   Diverticulosis of colon (without mention of hemorrhage)    Early satiety    Esophageal motility disorder 12/2018   Family history of adverse reaction to anesthesia    mom has PONV   GERD (gastroesophageal reflux disease)    History of kidney stones    2017   History of left inguinal hernia    Hypertension    Insomnia    Lyme disease    QUESTIONABLE   Pulmonary nodules 10/2018   Scattered tiny subpleural pulmonary nodules at the peripheral right lung base, largest 3 mm   Right inguinal hernia 2017   Small fat containing right inguinal hernia   Sleep apnea    no CPAP   Sleep deficient    Testosterone deficiency    Vitamin D deficiency  Wears glasses     Past Surgical History:  Procedure Laterality Date   BRAVO PH STUDY N/A 05/02/2019   Procedure: BRAVO PH STUDY;  Surgeon: Rachael Fee, MD;  Location: [facility];  Service: Endoscopy;  Laterality: N/A;   COLONOSCOPY  02/2011   ESOPHAGOGASTRODUODENOSCOPY (EGD) WITH PROPOFOL N/A 05/02/2019   Procedure: ESOPHAGOGASTRODUODENOSCOPY (EGD) WITH PROPOFOL;  Surgeon: Rachael Fee, MD;  Location: [facility];  Service: Endoscopy;  Laterality: N/A;   HERNIA REPAIR     NASAL SEPTOPLASTY W/ TURBINOPLASTY Bilateral 04/21/2021   Procedure: NASAL SEPTOPLASTY WITH  BILATERAL TURBINATE REDUCTION;  Surgeon: Newman Pies, MD;  Location: [facility];  Service: ENT;  Laterality: Bilateral;   TOE SURGERY     INGROWN TOE NAIL, CHILDHOOD   UPPER GASTROINTESTINAL ENDOSCOPY     VASECTOMY     Social History   Socioeconomic History   Marital status: Married    Spouse name: Not on file   Number of children: 1   Years of education: Not on file   Highest education level: Not on file  Occupational History   Occupation: PILOT    Employer: [employer]  Tobacco Use   Smoking status: Never   Smokeless tobacco: Never  Vaping Use   Vaping status: Never Used  Substance and Sexual Activity   Alcohol use: Yes    Alcohol/week: 2.0 - 3.0 standard drinks of alcohol    Types: 2 - 3 Cans of beer per week   Drug use: No   Sexual activity: Not on file  Other Topics Concern   Not on file  Social History Narrative   Daily caffeine    Social Determinants of Health   Financial Resource Strain: Not on file  Food Insecurity: Not on file  Transportation Needs: Not on file  Physical Activity: Not on file  Stress: Not on file  Social Connections: Unknown (03/22/2022)   Received from [facility]   Social Network    Social Network: Not on file  Intimate Partner Violence: Unknown (02/11/2022)   [facility] from [facility], [facility]   HITS    Physically Hurt: Not on file    Insult or Talk Down  To: Not on file    Threaten Physical Harm: Not on file    Scream or Curse: Not on file   Family History  Problem Relation Age of Onset   Cancer Mother 39       pancreatic   Colon polyps Brother    Colon polyps Maternal Uncle    Breast cancer Maternal Grandmother    Colon cancer Neg Hx    Esophageal cancer Neg Hx    Stomach cancer Neg Hx    Ulcerative colitis Neg Hx    Inflammatory bowel disease Neg Hx    Liver disease Neg Hx    Pancreatic cancer Neg Hx    Rectal cancer Neg Hx    I have reviewed his medical, social, and family history in detail and updated the electronic medical record as necessary.    PHYSICAL EXAMINATION  BP 110/70 (BP Location: Left Arm, Patient Position: Sitting, Cuff Size: Normal)   Pulse 68   Ht 5\' 8"  (1.727 m)   Wt 172 lb 6 oz (78.2 kg)   BMI 26.21 kg/m  Wt Readings from Last 3 Encounters:  09/13/23 172 lb 6 oz (78.2 kg)  09/13/23 172 lb 9.6 oz (78.3 kg)  09/05/23 173 lb 6.4 oz (78.7 kg)  GEN: NAD, appears stated age, doesn't  appear chronically ill PSYCH: Cooperative, without pressured speech EYE: Conjunctivae pink, sclerae anicteric ENT: MMM CV: Nontachycardic RESP: No audible wheezing GI: NABS, soft, protuberant abdomen, NT/ND, without rebound MSK/EXT: No lower extremity edema SKIN: No jaundice NEURO:  Alert & Oriented x 3, no focal deficits   REVIEW OF DATA  I reviewed the following data at the time of this encounter:  GI Procedures and Studies  Previously reviewed  Laboratory Studies  Reviewed those in epic  Imaging Studies  No new studies to review   ASSESSMENT  Mr. Nand is a 62 y.o. male with a pmh significant for hypertension, nephrolithiasis, OSA, chronic cough, GERD (though Bravo testing suggested less likely true acid related), diverticulosis, hemorrhoids.  The patient is seen today for evaluation and management of:  1. Pyrosis   2. Epigastric pain   3. Hemorrhoids, unspecified hemorrhoid type    The patient is  hemodynamically stable at this time.  Clinically, overall seems to be doing okay with the majority of his pyrosis symptoms but the reality is that his previous testing had actually not showing that he had evidence of true GERD and his DeMeester score was low.  This is more suggestive of esophageal hypersensitivity.  For now since he is still doing okay and we know that his most recent endoscopy was within the last year and a half we will not plan to repeat that though he has not been biopsied for eosinophilic esophagitis and that may be something we need to consider in the future.  For now he will continue his Aciphex twice daily with as needed Pepcid at bedtime.  His Carafate therapy for his abdominal upset stomach can be continued at short-term use for now but I do wonder whether there will be a role of considering repeat pH impedance testing at some point in the future.  In regards to his hemorrhoids he continues to use Preparation H with as needed suppository therapy and for now that is reasonable to help with his symptoms of itching and irritation.  Hemorrhoidal banding for the symptoms is not always considered to be ideal but I can discuss this further with one of my colleagues since he did have a significant improvement when he was on regular dosing of his Anusol and Calmol 4 suppositories that it may make sense to consider that if he wants in the future.  Certainly we would not pursue anything before he retires this coming year and I wish him the very best that he can reach out to [place] if there are other issues in the interim.  All patient questions were answered to the best of my ability, and the patient agrees to the aforementioned plan of action with follow-up as indicated.   PLAN  Continue stool softeners Continue fiber supplementation May use Preparation H ointment/cream as needed Alternate Calmol-4 suppositories/Preparation H suppositories as desired Query the role in the future of potential  hemorrhoidal therapy via banding due to patient having improvement with his symptoms and suppository therapy Continue Aciphex twice daily Continue Pepcid nightly May continue Carafate therapy 1-3 times daily a few times every few weeks Holding on PPI transition or PCAB transition Holding on TCA for functional heartburn/esophageal hypersensitivity Query repeat pH impedance testing in future Proceed with abdominal ultrasound for recurrent abdominal discomfort Consider cross-sectional imaging in future   Orders Placed This Encounter  Procedures   US Abdomen Complete    New Prescriptions   No medications on file   Modified Medications   Modified  Medication Previous Medication   RABEPRAZOLE (ACIPHEX) 20 MG TABLET RABEprazole (ACIPHEX) 20 MG tablet      Take 1 tablet (20 mg total) by mouth 2 (two) times daily.    TAKE 1 TABLET BY MOUTH TWICE A DAY   SUCRALFATE (CARAFATE) 1 G TABLET sucralfate (CARAFATE) 1 g tablet      Take 1 tablet (1 g total) by mouth 4 (four) times daily. PRN    Take 1 g by mouth 4 (four) times daily. PRN    Planned Follow Up No follow-ups on file.   Total Time in Face-to-Face and in Coordination of Care for patient including independent/personal interpretation/review of prior testing, medical history, examination, medication adjustment, communicating results with the patient directly, and documentation within the EHR is 25 minutes.   Corliss Parish, MD [facility] Office # [phone]

## 2023-09-16 ENCOUNTER — Encounter: Payer: Self-pay | Admitting: Gastroenterology

## 2023-09-16 DIAGNOSIS — R12 Heartburn: Secondary | ICD-10-CM | POA: Insufficient documentation

## 2023-09-20 ENCOUNTER — Encounter: Payer: Commercial Managed Care - PPO | Admitting: Internal Medicine

## 2023-09-20 ENCOUNTER — Encounter: Payer: Self-pay | Admitting: Internal Medicine

## 2023-09-21 ENCOUNTER — Ambulatory Visit (HOSPITAL_COMMUNITY)
Admission: RE | Admit: 2023-09-21 | Discharge: 2023-09-21 | Disposition: A | Discharge status: Home / Self Care | Payer: Commercial Managed Care - PPO | Source: Ambulatory Visit | Attending: Gastroenterology | Admitting: Gastroenterology

## 2023-09-21 DIAGNOSIS — K649 Unspecified hemorrhoids: Secondary | ICD-10-CM | POA: Insufficient documentation

## 2023-09-21 DIAGNOSIS — R1013 Epigastric pain: Secondary | ICD-10-CM | POA: Diagnosis present

## 2023-09-21 DIAGNOSIS — R12 Heartburn: Secondary | ICD-10-CM | POA: Diagnosis present

## 2023-10-06 ENCOUNTER — Other Ambulatory Visit: Payer: Self-pay | Admitting: Gastroenterology

## 2023-10-07 ENCOUNTER — Ambulatory Visit (HOSPITAL_COMMUNITY)
Admission: EM | Admit: 2023-10-07 | Discharge: 2023-10-07 | Disposition: A | Discharge status: Home / Self Care | Payer: Commercial Managed Care - PPO | Attending: Internal Medicine | Admitting: Internal Medicine

## 2023-10-07 ENCOUNTER — Encounter (HOSPITAL_COMMUNITY): Payer: Self-pay | Admitting: Emergency Medicine

## 2023-10-07 DIAGNOSIS — J028 Acute pharyngitis due to other specified organisms: Secondary | ICD-10-CM

## 2023-10-07 DIAGNOSIS — R051 Acute cough: Secondary | ICD-10-CM | POA: Diagnosis not present

## 2023-10-07 MED ORDER — BENZONATATE 100 MG PO CAPS
100.0000 mg | ORAL_CAPSULE | Freq: Three times a day (TID) | ORAL | 0 refills | Status: DC
Start: 2023-10-07 — End: 2023-10-30

## 2023-10-07 MED ORDER — PREDNISONE 10 MG PO TABS: 20.0000 mg | ORAL_TABLET | Freq: Every day | ORAL | 0 refills | Status: AC

## 2023-10-07 NOTE — Discharge Instructions (Addendum)
Viral upper respiratory infection. This does not require antibiotics. Can treat with the following:  Prednisone 20mg  daily for 3 days. Take in the morning.  Benzonatate 100mg  every 8 hours as needed for cough.  Rest and hydrate  Tylenol as needed for aches Return to urgent care or PCP if symptoms worsen or fail to resolve.

## 2023-10-07 NOTE — ED Triage Notes (Signed)
Pt c/o progressive cough, chest tightness and fatigue for 3 days

## 2023-10-07 NOTE — ED Provider Notes (Signed)
[facility]    CSN: [id number] Arrival date & time: 10/07/23  1637      History   Chief Complaint No chief complaint on file.   HPI Jay Brooks is a 62 y.o. male.   62 year old male who presents to urgent care with complaints of cough, fatigue and chest tightness for 3 days. The cough is not severe but feels like having to clear throat out. Also having some voice changing like nasal more but no congestion. Denies fevers or chills.      Past Medical History:  Diagnosis Date   Allergy    Chronic cough    Chronic sinusitis    Diverticulitis 2018   Mild   Diverticulosis of colon (without mention of hemorrhage)    Early satiety    Esophageal motility disorder 12/2018   Family history of adverse reaction to anesthesia    mom has PONV   GERD (gastroesophageal reflux disease)    History of kidney stones    2017   History of left inguinal hernia    Hypertension    Insomnia    Lyme disease    QUESTIONABLE   Pulmonary nodules 10/2018   Scattered tiny subpleural pulmonary nodules at the peripheral right lung base, largest 3 mm   Right inguinal hernia 2017   Small fat containing right inguinal hernia   Sleep apnea    no CPAP   Sleep deficient    Testosterone deficiency    Vitamin D deficiency    Wears glasses     Patient Active Problem List   Diagnosis Date Noted   Pyrosis 09/16/2023   Diverticulitis 01/25/2023   Nausea 10/20/2022   Epigastric pain 10/20/2022   Functional heartburn 10/20/2022   Rectal itching 10/20/2022   Hemorrhoids 10/20/2022   Psychophysiological insomnia 05/17/2022   Encounter for [employer] examination 03/07/2022   Intolerance of continuous positive airway pressure (CPAP) ventilation 05/19/2021   Status post nasal septoplasty 05/19/2021   OSA (obstructive sleep apnea) 12/21/2020   Chronic insomnia 06/08/2020   FHx: heart disease 08/13/2019   Essential hypertension 08/13/2019   Prostatism  12/08/2015   Abnormal glucose 12/01/2014   Vitamin D deficiency 12/01/2014   Hyperlipidemia, mixed 10/22/2014   Medication management 10/22/2014   Labile hypertension 10/22/2014   Testosterone Deficiency 08/20/2014   Cough 12/06/2012   GERD (gastroesophageal reflux disease) 12/06/2012   Chronic rhinitis 12/06/2012    Past Surgical History:  Procedure Laterality Date   BRAVO PH STUDY N/A 05/02/2019   Procedure: BRAVO PH STUDY;  Surgeon: Rachael Fee, MD;  Location: [facility];  Service: Endoscopy;  Laterality: N/A;   COLONOSCOPY  02/2011   ESOPHAGOGASTRODUODENOSCOPY (EGD) WITH PROPOFOL N/A 05/02/2019   Procedure: ESOPHAGOGASTRODUODENOSCOPY (EGD) WITH PROPOFOL;  Surgeon: Rachael Fee, MD;  Location: [facility];  Service: Endoscopy;  Laterality: N/A;   HERNIA REPAIR     NASAL SEPTOPLASTY W/ TURBINOPLASTY Bilateral 04/21/2021   Procedure: NASAL SEPTOPLASTY WITH  BILATERAL TURBINATE REDUCTION;  Surgeon: Newman Pies, MD;  Location: [facility];  Service: ENT;  Laterality: Bilateral;   TOE SURGERY     INGROWN TOE NAIL, CHILDHOOD   UPPER GASTROINTESTINAL ENDOSCOPY     VASECTOMY         Home Medications    Prior to Admission medications   Medication Sig Start Date End Date Taking? Authorizing Provider  albuterol (VENTOLIN HFA) 108 (90 Base) MCG/ACT inhaler Inhale into the lungs every 6 (six) hours as needed for wheezing or  shortness of breath.    [provider]  Azelastine HCl 137 MCG/SPRAY SOLN  07/02/21   [provider]  bisoprolol-hydrochlorothiazide (ZIAC) 2.5-6.25 MG tablet Take  1 tablet  Daily  for BP                                                                 /                                                                   TAKE                                         BY                                                 MOUTH 04/15/23   Lucky Cowboy, MD  calcium carbonate (TUMS EX) 750 MG chewable tablet Chew 1 tablet by mouth daily.    [provider]  cholecalciferol (VITAMIN D) 25 MCG (1000 UNIT) tablet Take 1,000 Units by mouth daily. 10,000 units    [provider]  clindamycin (CLEOCIN T) 1 % lotion Apply 1 application topically every evening.    [provider]  cycloSPORINE (RESTASIS OP) Apply to eye 2 (two) times daily.    [provider]  famotidine (PEPCID) 20 MG tablet Take 20 mg by mouth daily.    [provider]  fluticasone (FLONASE) 50 MCG/ACT nasal spray Place 2 sprays into both nostrils daily.     [provider]  hydrocortisone cream 1 % Apply 1 application topically daily as needed for itching.    [provider]  loratadine (CLARITIN) 10 MG tablet Take 10 mg by mouth daily.    [provider]  montelukast (SINGULAIR) 10 MG tablet TAKE 1 TABLET BY MOUTH DAILY FOR ALLERGIES 05/29/23   Adela Glimpse, NP  Olopatadine HCl 0.2 % SOLN Place 1 drop into both eyes daily as needed.    [provider]  PULMICORT FLEXHALER 180 MCG/ACT inhaler Inhale 1 puff into the lungs 2 (two) times daily. 01/30/23   [provider]  RABEprazole (ACIPHEX) 20 MG tablet Take 1 tablet (20 mg total) by mouth 2 (two) times daily. 09/13/23   Mansouraty, Netty Starring., MD  sucralfate (CARAFATE) 1 g tablet Take 1 tablet (1 g total) by mouth 4 (four) times daily. PRN 09/13/23   Mansouraty, Netty Starring., MD  tamsulosin (FLOMAX) 0.4 MG CAPS capsule TAKE 1 CAPSULE AT BEDTIME FOR PROSTATE 08/03/20   Judd Gaudier, NP  triamcinolone cream (KENALOG) 0.1 % Apply 1 application topically 2 (two) times daily as needed for itching. 03/25/19   [provider]  Vibegron (GEMTESA) 75 MG TABS Take by mouth daily.    [provider]  Zinc 50  MG TABS Take 1 tablet Daily 09/17/21   Lucky Cowboy, MD    Family History Family History  Problem Relation Age of Onset   Cancer Mother 53       pancreatic   Colon polyps Brother    Colon polyps Maternal Uncle    Breast  cancer Maternal Grandmother    Colon cancer Neg Hx    Esophageal cancer Neg Hx    Stomach cancer Neg Hx    Ulcerative colitis Neg Hx    Inflammatory bowel disease Neg Hx    Liver disease Neg Hx    Pancreatic cancer Neg Hx    Rectal cancer Neg Hx     Social History Social History   Tobacco Use   Smoking status: Never   Smokeless tobacco: Never  Vaping Use   Vaping status: Never Used  Substance Use Topics   Alcohol use: Yes    Alcohol/week: 2.0 - 3.0 standard drinks of alcohol    Types: 2 - 3 Cans of beer per week   Drug use: No     Allergies   Allegra [fexofenadine], Benzoyl peroxide, Decadron [dexamethasone], Doxycycline, and Levaquin [levofloxacin]   Review of Systems Review of Systems  Constitutional:  Positive for fatigue. Negative for chills and fever.  HENT:  Negative for ear pain and sore throat.   Eyes:  Negative for pain and visual disturbance.  Respiratory:  Positive for cough and chest tightness. Negative for shortness of breath.   Cardiovascular:  Negative for chest pain and palpitations.  Gastrointestinal:  Negative for abdominal pain and vomiting.  Genitourinary:  Negative for dysuria and hematuria.  Musculoskeletal:  Negative for arthralgias and back pain.  Skin:  Negative for color change and rash.  Neurological:  Negative for seizures and syncope.  All other systems reviewed and are negative.    Physical Exam Triage Vital Signs ED Triage Vitals  Encounter Vitals Group     BP 10/07/23 1717 137/83     Systolic BP Percentile --      Diastolic BP Percentile --      Pulse Rate 10/07/23 1717 61     Resp 10/07/23 1717 16     Temp 10/07/23 1717 97.6 F (36.4 C)     Temp Source 10/07/23 1717 Oral     SpO2 10/07/23 1717 97 %     Weight --      Height --      Head Circumference --      Peak Flow --      Pain Score 10/07/23 1716 3     Pain Loc --      Pain Education --      Exclude from Growth Chart --    No data found.  Updated Vital  Signs BP 137/83 (BP Location: Right Arm)   Pulse 61   Temp 97.6 F (36.4 C) (Oral)   Resp 16   SpO2 97%   Visual Acuity Right Eye Distance:   Left Eye Distance:   Bilateral Distance:    Right Eye Near:   Left Eye Near:    Bilateral Near:     Physical Exam Vitals and nursing note reviewed.  Constitutional:      General: He is not in acute distress.    Appearance: He is well-developed.  HENT:     Head: Normocephalic and atraumatic.     Right Ear: Tympanic membrane normal.     Left Ear: Tympanic membrane normal.     Nose: No congestion.  Mouth/Throat:     Mouth: Mucous membranes are moist.     Pharynx: Posterior oropharyngeal erythema (mild) present.  Eyes:     Conjunctiva/sclera: Conjunctivae normal.  Cardiovascular:     Rate and Rhythm: Normal rate and regular rhythm.     Heart sounds: No murmur heard. Pulmonary:     Effort: Pulmonary effort is normal. No respiratory distress.     Breath sounds: Normal breath sounds.  Abdominal:     Palpations: Abdomen is soft.     Tenderness: There is no abdominal tenderness.  Musculoskeletal:        General: No swelling.     Cervical back: Neck supple.  Skin:    General: Skin is warm and dry.     Capillary Refill: Capillary refill takes less than 2 seconds.  Neurological:     Mental Status: He is alert.  Psychiatric:        Mood and Affect: Mood normal.      UC Treatments / Results  Labs (all labs ordered are listed, but only abnormal results are displayed) Labs Reviewed - No data to display  EKG   Radiology No results found.  Procedures Procedures (including critical care time)  Medications Ordered in UC Medications - No data to display  Initial Impression / Assessment and Plan / UC Course  I have reviewed the triage vital signs and the nursing notes.  Pertinent labs & imaging results that were available during my care of the patient were reviewed by me and considered in my medical decision making (see  chart for details).     Acute cough  Acute pharyngitis due to other specified organisms   Viral upper respiratory infection. This does not require antibiotics. Can treat with the following:  Prednisone 20mg  daily for 3 days. Take in the morning.  Benzonatate 100mg  every 8 hours as needed for cough.  Rest and hydrate  Tylenol as needed for aches Return to urgent care or PCP if symptoms worsen or fail to resolve.    Final Clinical Impressions(s) / UC Diagnoses   Final diagnoses:  None   Discharge Instructions   None    ED Prescriptions   None    PDMP not reviewed this encounter.   Landis Martins, [place] 10/07/23 1737

## 2023-10-10 ENCOUNTER — Encounter: Payer: Commercial Managed Care - PPO | Admitting: Nurse Practitioner

## 2023-10-14 ENCOUNTER — Ambulatory Visit
Admission: EM | Admit: 2023-10-14 | Discharge: 2023-10-14 | Disposition: A | Discharge status: Home / Self Care | Payer: Commercial Managed Care - PPO | Attending: Emergency Medicine | Admitting: Emergency Medicine

## 2023-10-14 ENCOUNTER — Encounter: Payer: Self-pay | Admitting: Emergency Medicine

## 2023-10-14 DIAGNOSIS — J029 Acute pharyngitis, unspecified: Secondary | ICD-10-CM

## 2023-10-14 LAB — POCT RAPID STREP A (OFFICE): Rapid Strep A Screen: NEGATIVE

## 2023-10-14 MED ORDER — AZITHROMYCIN 250 MG PO TABS: 250.0000 mg | ORAL_TABLET | Freq: Every day | ORAL | 0 refills | Status: DC

## 2023-10-14 NOTE — ED Triage Notes (Signed)
Pt believes he has thrush on the back of his throat x 1 week.

## 2023-10-14 NOTE — Discharge Instructions (Addendum)
Rapid strep test is negative today for bacteria  On exam there is mild amount of redness to your throat and no signs of thrush which is a Shinita Mac fungus  As your symptoms began 10 days ago we will prophylactically place you on antibiotic for bacterial coverage to the upper airways, begin azithromycin as directed    You can take Tylenol and/or Ibuprofen as needed for fever reduction and pain relief.   For cough: honey 1/2 to 1 teaspoon (you can dilute the honey in water or another fluid).  You can also use guaifenesin and dextromethorphan for cough. You can use a humidifier for chest congestion and cough.  If you don't have a humidifier, you can sit in the bathroom with the hot shower running.      For sore throat: try warm salt water gargles, cepacol lozenges, throat spray, warm tea or water with lemon/honey, popsicles or ice, or OTC cold relief medicine for throat discomfort.   For congestion: take a daily anti-histamine like Zyrtec, Claritin, and a oral decongestant, such as pseudoephedrine.  You can also use Flonase 1-2 sprays in each nostril daily.   It is important to stay hydrated: drink plenty of fluids (water, gatorade/powerade/pedialyte, juices, or teas) to keep your throat moisturized and help further relieve irritation/discomfort.

## 2023-10-14 NOTE — ED Provider Notes (Signed)
Jay Brooks    CSN: [id number] Arrival date & time: 10/14/23  1432      History   Chief Complaint Chief Complaint  Patient presents with   Thrush    HPI Jay Brooks is a 62 y.o. male.   Patient presents for evaluation of a sore throat present for 7 days.  Initially experiencing cough and nasal congestion, evaluated in urgent care 3 days post illness, diagnosed with viral etiology and prescribed prednisone.  Concerns for possible thrush as he has experienced in the past and does use daily inhaler of Pulmicort due to asthma.  Denies worsening cough or congestion.  Denies shortness of breath or wheezing.  Able to tolerate food and liquids.  Past Medical History:  Diagnosis Date   Allergy    Chronic cough    Chronic sinusitis    Diverticulitis 2018   Mild   Diverticulosis of colon (without mention of hemorrhage)    Early satiety    Esophageal motility disorder 12/2018   Family history of adverse reaction to anesthesia    mom has PONV   GERD (gastroesophageal reflux disease)    History of kidney stones    2017   History of left inguinal hernia    Hypertension    Insomnia    Lyme disease    QUESTIONABLE   Pulmonary nodules 10/2018   Scattered tiny subpleural pulmonary nodules at the peripheral right lung base, largest 3 mm   Right inguinal hernia 2017   Small fat containing right inguinal hernia   Sleep apnea    no CPAP   Sleep deficient    Testosterone deficiency    Vitamin D deficiency    Wears glasses     Patient Active Problem List   Diagnosis Date Noted   Pyrosis 09/16/2023   Diverticulitis 01/25/2023   Nausea 10/20/2022   Epigastric pain 10/20/2022   Functional heartburn 10/20/2022   Rectal itching 10/20/2022   Hemorrhoids 10/20/2022   Psychophysiological insomnia 05/17/2022   Encounter for [employer] examination 03/07/2022   Intolerance of continuous positive airway pressure (CPAP) ventilation 05/19/2021    Status post nasal septoplasty 05/19/2021   OSA (obstructive sleep apnea) 12/21/2020   Chronic insomnia 06/08/2020   FHx: heart disease 08/13/2019   Essential hypertension 08/13/2019   Prostatism 12/08/2015   Abnormal glucose 12/01/2014   Vitamin D deficiency 12/01/2014   Hyperlipidemia, mixed 10/22/2014   Medication management 10/22/2014   Labile hypertension 10/22/2014   Testosterone Deficiency 08/20/2014   Cough 12/06/2012   GERD (gastroesophageal reflux disease) 12/06/2012   Chronic rhinitis 12/06/2012    Past Surgical History:  Procedure Laterality Date   BRAVO PH STUDY N/A 05/02/2019   Procedure: BRAVO PH STUDY;  Surgeon: Rachael Fee, MD;  Location: [facility];  Service: Endoscopy;  Laterality: N/A;   COLONOSCOPY  02/2011   ESOPHAGOGASTRODUODENOSCOPY (EGD) WITH PROPOFOL N/A 05/02/2019   Procedure: ESOPHAGOGASTRODUODENOSCOPY (EGD) WITH PROPOFOL;  Surgeon: Rachael Fee, MD;  Location: [facility];  Service: Endoscopy;  Laterality: N/A;   HERNIA REPAIR     NASAL SEPTOPLASTY W/ TURBINOPLASTY Bilateral 04/21/2021   Procedure: NASAL SEPTOPLASTY WITH  BILATERAL TURBINATE REDUCTION;  Surgeon: Newman Pies, MD;  Location: [facility];  Service: ENT;  Laterality: Bilateral;   TOE SURGERY     INGROWN TOE NAIL, CHILDHOOD   UPPER GASTROINTESTINAL ENDOSCOPY     VASECTOMY         Home Medications    Prior to Admission medications  Medication Sig Start Date End Date Taking? Authorizing Provider  azithromycin (ZITHROMAX) 250 MG tablet Take 1 tablet (250 mg total) by mouth daily. Take first 2 tablets together, then 1 every day until finished. 10/14/23  Yes Raaga Maeder R, NP  albuterol (VENTOLIN HFA) 108 (90 Base) MCG/ACT inhaler Inhale into the lungs every 6 (six) hours as needed for wheezing or shortness of breath.    [provider]  Azelastine HCl 137 MCG/SPRAY SOLN  07/02/21   [provider]  benzonatate (TESSALON) 100 MG capsule Take 1 capsule (100 mg total)  by mouth every 8 (eight) hours. 10/07/23   Shaquanta Harkless, Austin Miles, PA-C  bisoprolol-hydrochlorothiazide (ZIAC) 2.5-6.25 MG tablet Take  1 tablet  Daily  for BP                                                                 /                                                                   TAKE                                         BY                                                 MOUTH 04/15/23   Lucky Cowboy, MD  calcium carbonate (TUMS EX) 750 MG chewable tablet Chew 1 tablet by mouth daily.    [provider]  cholecalciferol (VITAMIN D) 25 MCG (1000 UNIT) tablet Take 1,000 Units by mouth daily. 10,000 units    [provider]  clindamycin (CLEOCIN T) 1 % lotion Apply 1 application topically every evening.    [provider]  cycloSPORINE (RESTASIS OP) Apply to eye 2 (two) times daily.    [provider]  famotidine (PEPCID) 20 MG tablet Take 20 mg by mouth daily.    [provider]  fluticasone (FLONASE) 50 MCG/ACT nasal spray Place 2 sprays into both nostrils daily.     [provider]  hydrocortisone cream 1 % Apply 1 application topically daily as needed for itching.    [provider]  loratadine (CLARITIN) 10 MG tablet Take 10 mg by mouth daily.    [provider]  montelukast (SINGULAIR) 10 MG tablet TAKE 1 TABLET BY MOUTH DAILY FOR ALLERGIES 05/29/23   Adela Glimpse, NP  Olopatadine HCl 0.2 % SOLN Place 1 drop into both eyes daily as needed.    [provider]  PULMICORT FLEXHALER 180 MCG/ACT inhaler Inhale 1 puff into the lungs 2 (two) times daily. 01/30/23   [provider]  RABEprazole (ACIPHEX) 20 MG tablet Take 1 tablet (20 mg total) by mouth 2 (two) times daily. 09/13/23   Mansouraty, Netty Starring., MD  sucralfate (CARAFATE) 1 g tablet TAKE 1 TABLET (1 G TOTAL) BY MOUTH 4 TIMES A DAY WITH MEALS AND AT BEDTIME 10/09/23   Mansouraty, Netty Starring., MD  tamsulosin (FLOMAX) 0.4 MG CAPS capsule TAKE 1  CAPSULE AT BEDTIME FOR PROSTATE 08/03/20   Judd Gaudier, NP  triamcinolone cream (KENALOG) 0.1 % Apply 1 application topically 2 (two) times daily as needed for itching. 03/25/19   [provider]  Vibegron (GEMTESA) 75 MG TABS Take by mouth daily.    [provider]  Zinc 50 MG TABS Take 1 tablet Daily 09/17/21   Lucky Cowboy, MD    Family History Family History  Problem Relation Age of Onset   Cancer Mother 58       pancreatic   Colon polyps Brother    Colon polyps Maternal Uncle    Breast cancer Maternal Grandmother    Colon cancer Neg Hx    Esophageal cancer Neg Hx    Stomach cancer Neg Hx    Ulcerative colitis Neg Hx    Inflammatory bowel disease Neg Hx    Liver disease Neg Hx    Pancreatic cancer Neg Hx    Rectal cancer Neg Hx     Social History Social History   Tobacco Use   Smoking status: Never   Smokeless tobacco: Never  Vaping Use   Vaping status: Never Used  Substance Use Topics   Alcohol use: Yes    Alcohol/week: 2.0 - 3.0 standard drinks of alcohol    Types: 2 - 3 Cans of beer per week   Drug use: No     Allergies   Allegra [fexofenadine], Benzoyl peroxide, Decadron [dexamethasone], Doxycycline, and Levaquin [levofloxacin]   Review of Systems Review of Systems   Physical Exam Triage Vital Signs ED Triage Vitals  Encounter Vitals Group     BP 10/14/23 1505 129/85     Systolic BP Percentile --      Diastolic BP Percentile --      Pulse Rate 10/14/23 1505 71     Resp 10/14/23 1505 16     Temp 10/14/23 1505 98.6 F (37 C)     Temp Source 10/14/23 1505 Oral     SpO2 10/14/23 1505 96 %     Weight --      Height --      Head Circumference --      Peak Flow --      Pain Score 10/14/23 1503 2     Pain Loc --      Pain Education --      Exclude from Growth Chart --    No data found.  Updated Vital Signs BP 129/85 (BP Location: Left Arm)   Pulse 71   Temp 98.6 F (37 C) (Oral)   Resp 16   SpO2 96%   Visual  Acuity Right Eye Distance:   Left Eye Distance:   Bilateral Distance:    Right Eye Near:   Left Eye Near:    Bilateral Near:     Physical Exam Constitutional:      Appearance: Normal appearance.  HENT:     Mouth/Throat:     Comments: Scant erythema noted to the oropharynx without tonsillar adenopathy or exudate, no signs of thrush noted Eyes:     Extraocular Movements: Extraocular movements intact.  Pulmonary:     Effort: Pulmonary effort is normal.  Neurological:     Mental Status: He is alert and oriented to person, place, and time.  Mental status is at baseline.      UC Treatments / Results  Labs (all labs ordered are listed, but only abnormal results are displayed) Labs Reviewed  POCT RAPID STREP A (OFFICE)    EKG   Radiology No results found.  Procedures Procedures (including critical care time)  Medications Ordered in UC Medications - No data to display  Initial Impression / Assessment and Plan / UC Course  I have reviewed the triage vital signs and the nursing notes.  Pertinent labs & imaging results that were available during my care of the patient were reviewed by me and considered in my medical decision making (see chart for details).  Acute Pharyngitis  Patient is in no signs of distress nor toxic appearing.  Vital signs are stable.  Low suspicion for pneumonia, pneumothorax or bronchitis and therefore will defer imaging.  Test negative.  Symptoms persisting for 10 days therefore providing bacterial coverage as azithromycin sent to pharmacy.  Declined prescription for viscous lidocaine and Magic mouthwash.May use additional over-the-counter medications as needed for supportive care.  May follow-up with urgent care as needed if symptoms persist or worsen.  Final Clinical Impressions(s) / UC Diagnoses   Final diagnoses:  Acute pharyngitis, unspecified etiology     Discharge Instructions      Rapid strep test is negative today for bacteria  On  exam there is mild amount of redness to your throat and no signs of thrush which is a Jesua Tamblyn fungus  As your symptoms began 10 days ago we will prophylactically place you on antibiotic for bacterial coverage to the upper airways, begin azithromycin as directed    You can take Tylenol and/or Ibuprofen as needed for fever reduction and pain relief.   For cough: honey 1/2 to 1 teaspoon (you can dilute the honey in water or another fluid).  You can also use guaifenesin and dextromethorphan for cough. You can use a humidifier for chest congestion and cough.  If you don't have a humidifier, you can sit in the bathroom with the hot shower running.      For sore throat: try warm salt water gargles, cepacol lozenges, throat spray, warm tea or water with lemon/honey, popsicles or ice, or OTC cold relief medicine for throat discomfort.   For congestion: take a daily anti-histamine like Zyrtec, Claritin, and a oral decongestant, such as pseudoephedrine.  You can also use Flonase 1-2 sprays in each nostril daily.   It is important to stay hydrated: drink plenty of fluids (water, gatorade/powerade/pedialyte, juices, or teas) to keep your throat moisturized and help further relieve irritation/discomfort.    ED Prescriptions     Medication Sig Dispense Auth. Provider   azithromycin (ZITHROMAX) 250 MG tablet Take 1 tablet (250 mg total) by mouth daily. Take first 2 tablets together, then 1 every day until finished. 6 tablet Valinda Hoar, NP      PDMP not reviewed this encounter.   Valinda Hoar, NP 10/14/23 1534

## 2023-10-30 ENCOUNTER — Other Ambulatory Visit: Payer: Self-pay

## 2023-10-30 ENCOUNTER — Ambulatory Visit (INDEPENDENT_AMBULATORY_CARE_PROVIDER_SITE_OTHER): Payer: Commercial Managed Care - PPO | Admitting: Internal Medicine

## 2023-10-30 ENCOUNTER — Encounter: Payer: Self-pay | Admitting: Internal Medicine

## 2023-10-30 VITALS — BP 126/72 | HR 87 | Temp 100.9°F | Ht 69.0 in | Wt 169.8 lb

## 2023-10-30 DIAGNOSIS — Z1152 Encounter for screening for COVID-19: Secondary | ICD-10-CM | POA: Diagnosis not present

## 2023-10-30 DIAGNOSIS — J069 Acute upper respiratory infection, unspecified: Secondary | ICD-10-CM | POA: Diagnosis not present

## 2023-10-30 DIAGNOSIS — J0410 Acute tracheitis without obstruction: Secondary | ICD-10-CM | POA: Diagnosis not present

## 2023-10-30 LAB — POC COVID19 BINAXNOW: SARS Coronavirus 2 Ag: NEGATIVE

## 2023-10-30 MED ORDER — AZITHROMYCIN 250 MG PO TABS: ORAL_TABLET | ORAL | 1 refills | Status: DC

## 2023-10-30 MED ORDER — PREDNISONE 10 MG PO TABS: ORAL_TABLET | ORAL | 0 refills | Status: DC

## 2023-10-30 NOTE — Progress Notes (Signed)
[facility]      ADULT   &   ADOLESCENT      INTERNAL MEDICINE  Lucky Cowboy, M.D.          Rance Muir, ANP        Adela Glimpse, FNP  [facility] [address]  [place], [place]. [address] Telephone [phone] Telefax [phone]  Future Appointments  Date Time Provider Department  01/15/2024           welcome to Medicare   2:30 PM Adela Glimpse, NP [facility]    History of Present Illness:      This very nice 62 y.o. MWM with HTN, HLD, Pre-Diabetes and Vitamin D Deficiency  presents with history of treated 1 month ago at an urgent care for a "viral syndrome " with a prednisone taper. Then 2 weeks ago was again treated at an urgent care for a pharyngitis with a Zpak. Now he presents with a 3-4 day prodrome of sore throat, cough, chills & low grade fever.  Current Outpatient Medications  Medication Instructions   Albuterol  HFA  inhaler Every 6 hours PRN   Azelastine SPRAY SOLN    bisoprolol-hctz 2.5-6.25 MG tablet Take  1 tablet  Daily     TUMS  750 MG tablet 1 tablet Daily   VITAMIN D 1,000 Units   Daily   CLEOCIN T 1 % lotion 1 application  Every evening   RESTASIS  2 times daily   Famotidine  20 mg Daily   FLONASE  nasal spray 2 sprays, Daily   hydrocortisone cream 1 % 1 application , Daily PRN   loratadine  10 mg Daily   montelukast 10 MG tablet TAKE 1 TABLET  DAILY    Olopatadine HCl 0.2 % SOLN 1 drop Daily PRN   PULMICORT FLEXHALER 180 inhaler 1 puff, 2 times daily   RABEprazole 20 mg  2 times daily   sucralfate  1 g tablet TAKE 1 TABLET 4 TIMES A DAY   tamsulosin 0.4 MG CAPS  TAKE 1 CAPSULE AT BEDTIME    triamcinolone cream 0.1 % 1 application  2 times daily PRN   Vibegron (GEMTESA) 75 MG TABS Daily   Zinc 50 MG TABS Take 1 tablet Daily     Allergies  Allergen Reactions   Allegra [Fexofenadine] Other (See Comments)    Worsened insomnia   Benzoyl Peroxide     blisters   Decadron [Dexamethasone]     Reflux & Hiccups, GI  upset   Doxycycline Nausea Only   Levaquin [Levofloxacin] Nausea And Vomiting    Patient states that after he took levaquin this past time he had nausea with vomiting     PMHx:   Past Medical History:  Diagnosis Date   Allergy    Chronic cough    Chronic sinusitis    Diverticulitis 2018   Mild   Diverticulosis of colon (without mention of hemorrhage)    Early satiety    Esophageal motility disorder 12/2018   Family history of adverse reaction to anesthesia    mom has PONV   GERD (gastroesophageal reflux disease)    History of kidney stones    2017   History of left inguinal hernia    Hypertension    Insomnia    Lyme disease    QUESTIONABLE   Pulmonary nodules 10/2018   Scattered tiny subpleural pulmonary nodules at the peripheral right lung base, largest 3 mm   Right inguinal hernia 2017  Small fat containing right inguinal hernia   Sleep apnea    no CPAP   Sleep deficient    Testosterone deficiency    Vitamin D deficiency    Wears glasses      Immunization History  Administered Date(s) Administered   Influenza Inj Mdck Quad  08/14/2019, 09/07/2020   Influenza Split 08/07/2012, 09/25/2014   Influenza, High Dose  09/05/2018   Influenza, Seasonal 09/25/2015   Influenza,inj,Quad  09/14/2021, 09/14/2022   Influenza,inj,quad 09/15/2016   PFIZER SARS-COV-2 Vacc 04/17/2020, 05/08/2020, 06/23/2020   PPD Test 09/07/2020, 09/14/2021, 09/14/2022   Td 09/07/2020   Tdap 07/08/2010   Zoster Recombinat (Shingrix) 10/14/2021, 03/11/2022     Past Surgical History:  Procedure Laterality Date   BRAVO PH STUDY N/A 05/02/2019   Procedure: BRAVO PH STUDY;  Surgeon: Rachael Fee, MD;  Location: [facility];  Service: Endoscopy;  Laterality: N/A;   COLONOSCOPY  02/2011   ESOPHAGOGASTRODUODENOSCOPY (EGD) WITH PROPOFOL N/A 05/02/2019   Procedure: ESOPHAGOGASTRODUODENOSCOPY (EGD) WITH PROPOFOL;  Surgeon: Rachael Fee, MD;  Location: [facility];  Service: Endoscopy;   Laterality: N/A;   HERNIA REPAIR     NASAL SEPTOPLASTY W/ TURBINOPLASTY Bilateral 04/21/2021   Procedure: NASAL SEPTOPLASTY WITH  BILATERAL TURBINATE REDUCTION;  Surgeon: Newman Pies, MD;  Location: [facility];  Service: ENT;  Laterality: Bilateral;   TOE SURGERY     INGROWN TOE NAIL, CHILDHOOD   UPPER GASTROINTESTINAL ENDOSCOPY     VASECTOMY       FHx:    Reviewed / unchanged   SHx:    Reviewed / unchanged    Systems Review:  Constitutional: Denies fever, chills, wt changes, headaches, insomnia, fatigue, night sweats, change in appetite. Eyes: Denies redness, blurred vision, diplopia, discharge, itchy, watery eyes.  ENT: Denies discharge,  post nasal drip, epistaxis, earache, hearing loss, dental pain, tinnitus, vertigo, sinus pain, snoring.  CV: Denies chest pain, palpitations, irregular heartbeat, syncope, dyspnea, diaphoresis, orthopnea, PND, claudication or edema. Respiratory: denies dyspnea, DOE, pleurisy,  wheezing.  Gastrointestinal: Denies dysphagia, odynophagia, heartburn, reflux, water brash, abdominal pain or cramps, nausea, vomiting, bloating, diarrhea, constipation, hematemesis, melena, hematochezia  or hemorrhoids. Genitourinary: Denies dysuria, frequency, urgency, nocturia, hesitancy, discharge, hematuria or flank pain. Musculoskeletal: Denies arthralgias, myalgias, stiffness, jt. swelling, pain, limping or strain/sprain.  Skin: Denies pruritus, rash, hives, warts, acne, eczema or change in skin lesion(s). Neuro: No weakness, tremor, incoordination, spasms, paresthesia or pain. Psychiatric: Denies confusion, memory loss or sensory loss. Endo: Denies change in weight, skin or hair change.  Heme/Lymph: No excessive bleeding, bruising or enlarged lymph nodes.   Physical Exam  BP 126/72   Pulse 87   Temp (!) 100.9 F (38.3 C)   Ht 5\' 9"  (1.753 m)   Wt 169 lb 12.8 oz (77 kg)   SpO2 98%   BMI 25.08 kg/m    Speech - sl hoarse.  Dry tracheal cough. No rash, cyanosis  stridor &in no respiratory distress.  Eyes: PERRLA, EOMs, conjunctiva no swelling or erythema. Sinuses: No frontal/maxillary tenderness ENT/Mouth: EAC's clear, TM's nl w/o erythema, bulging. Nares clear w/o erythema, swelling, exudates. Oropharynx clear with 1(+)  erythema & no  exudates. Oral hygiene is good. Tongue normal, non obstructing. Hearing intact.  Neck: Supple. Thyroid not palpable. Car 2+/2+ without bruits, nodes or JVD. Chest: Respirations nl with BS clear & equal w/o rales, rhonchi, wheezing or stridor.  Cor: Heart sounds normal w/ regular rate and rhythm without sig. murmurs, gallops, clicks or rubs. Peripheral pulses normal  and equal  without edema.  Abdomen: Soft & bowel sounds normal. Non-tender w/o guarding, rebound, hernias, masses or organomegaly.  Lymphatics: Unremarkable.  Musculoskeletal: Full ROM all peripheral extremities, joint stability, 5/5 strength and normal gait.  Skin: Warm, dry without exposed rashes, lesions or ecchymosis apparent.  Neuro: Cranial nerves intact, reflexes equal bilaterally. Sensory-motor testing grossly intact. Tendon reflexes grossly intact.  Pysch: Alert & oriented x 3.  Insight and judgement nl & appropriate. No ideations.   Assessment and Plan:   1. URI with cough and congestion (Primary)  - predniSONE  10 MG tablet;    1 tab 3 x day for 3 days, then 1 tab 2 x day for 3 days, then 1 tab 1 Daily     Dispense: 20 tablet; Refill: 0  2. Tracheitis  - predniSONE  10 MG tablet;    1 tab 3 x day for 3 days, then 1 tab 2 x day for 3 days, then 1 tab 1 Daily   Dispense: 20 tablet; Refill: 0  - azithromycin  250 MG tablet;  Take 2 tablets with Food on  Day 1, then 1 tablet Daily  Dispense: 6 each; Refill: 1  3. Encounter for screening for COVID-19  - POC COVID-19 - Negative        I discussed the assessment and treatment plan with the patient. The patient was provided an opportunity to ask questions and all were answered. The  patient agreed with the plan and demonstrated an understanding of the instructions.  I provided over 20 minutes of exam, counseling, chart review and  complex critical decision making.        The patient was advised to call back or seek an in-person evaluation if the symptoms worsen or if the condition fails to improve as anticipated.   Marinus Maw, MD

## 2023-11-06 NOTE — Progress Notes (Signed)
 Assessment and Plan:  RAMONA SLINGER was seen today for an episodic visit.  Diagnoses and all order for this visit:  1. URI with cough and congestion (Primary) Continue Z Pak - take in full. Start Promethazine  cough syrup.  Stay well hydrated to keep any mucus thin an d productive. Coughing can be cuased by several factors including   breathing in things that bother (irritate) your lungs.  Allergies.  Asthma.  Mucus that runs down the back of your throat (postnasal drip).  Smoking/smoke.  Acid backing up from the stomach into the tube that moves food from the mouth to the stomach (gastroesophageal reflux). A cough can linger for 3 weeks. Watch for any changes in your cough and contact office if noticed including blood, pus, pain, night sweats. Cover your mouth when you cough. If the air is dry, use a cool mist vaporizer or humidifier in your home. If your cough is worse at night, try using extra pillows to raise your head up higher while you sleep. Call 911 or report to ER if you start to have difficulty breathing.   - promethazine -dextromethorphan (PROMETHAZINE -DM) 6.25-15 MG/5ML syrup; Take 5 mLs by mouth 4 (four) times daily as needed for cough.  Dispense: 240 mL; Refill: 0 - CXR  Tracheitis Report to ER or contact 911 for any increase in difficulty breathing.   Notify office for further evaluation and treatment, questions or concerns if s/s fail to improve. The risks and benefits of my recommendations, as well as other treatment options were discussed with the patient today. Questions were answered.  Further disposition pending results of labs. Discussed med's effects and SE's.    Over 20 minutes of exam, counseling, chart review, and critical decision making was performed.   Future Appointments  Date Time Provider Department Center  02/19/2024 11:30 AM Laurice President, NP [facility] None  05/22/2024 11:00 AM Tonita Fallow, MD [facility] None  08/26/2024 11:00 AM  Laurice President, NP [facility] None    ------------------------------------------------------------------------------------------------------------------   HPI BP 122/76   Pulse 64   Temp 97.8 F (36.6 C)   Ht 5' 9 (1.753 m)   Wt 164 lb 9.6 oz (74.7 kg)   SpO2 99%   BMI 24.31 kg/m   Patient complains of symptoms of a URI, possible sinusitis. Symptoms include congestion, cough described as nonproductive, and sinus pressure. Onset of symptoms was 1 week ago, however upon review of chart patient was seen in ER 10/07/23 and 10/14/23 for similar issues.  Cough has since been unchanged since that time. Treatment to date: antibiotics, antihistamines, cough suppressants, decongestants, and nasal steroids.  Past Medical History:  Diagnosis Date   Allergy    Chronic cough    Chronic sinusitis    Diverticulitis 2018   Mild   Diverticulosis of colon (without mention of hemorrhage)    Early satiety    Esophageal motility disorder 12/2018   Family history of adverse reaction to anesthesia    mom has PONV   GERD (gastroesophageal reflux disease)    History of kidney stones    2017   History of left inguinal hernia    Hypertension    Insomnia    Lyme disease    QUESTIONABLE   Pulmonary nodules 10/2018   Scattered tiny subpleural pulmonary nodules at the peripheral right lung base, largest 3 mm   Right inguinal hernia 2017   Small fat containing right inguinal hernia   Sleep apnea    no CPAP   Sleep deficient  Testosterone  deficiency    Vitamin D  deficiency    Wears glasses      Allergies  Allergen Reactions   Allegra [Fexofenadine] Other (See Comments)    Worsened insomnia   Benzoyl Peroxide     blisters   Decadron  [Dexamethasone ]     Reflux & Hiccups, GI upset   Doxycycline  Nausea Only   Levaquin  [Levofloxacin ] Nausea And Vomiting    Patient states that after he took levaquin  this past time he had nausea with vomiting    Current Outpatient Medications on File Prior  to Visit  Medication Sig   albuterol  (VENTOLIN  HFA) 108 (90 Base) MCG/ACT inhaler Inhale into the lungs every 6 (six) hours as needed for wheezing or shortness of breath.   Azelastine  HCl 137 MCG/SPRAY SOLN    bisoprolol -hydrochlorothiazide  (ZIAC ) 2.5-6.25 MG tablet Take  1 tablet  Daily  for BP                                                                 /                                                                   TAKE                                         BY                                                 MOUTH   calcium carbonate (TUMS EX) 750 MG chewable tablet Chew 1 tablet by mouth daily.   cholecalciferol (VITAMIN D ) 25 MCG (1000 UNIT) tablet Take 1,000 Units by mouth daily. 10,000 units   clindamycin (CLEOCIN T) 1 % lotion Apply 1 application topically every evening.   cycloSPORINE (RESTASIS OP) Apply to eye 2 (two) times daily.   famotidine  (PEPCID ) 20 MG tablet Take 20 mg by mouth daily.   fluticasone  (FLONASE) 50 MCG/ACT nasal spray Place 2 sprays into both nostrils daily.    hydrocortisone  cream 1 % Apply 1 application topically daily as needed for itching.   loratadine (CLARITIN) 10 MG tablet Take 10 mg by mouth daily.   montelukast  (SINGULAIR ) 10 MG tablet TAKE 1 TABLET BY MOUTH DAILY FOR ALLERGIES   Olopatadine HCl 0.2 % SOLN Place 1 drop into both eyes daily as needed.   PULMICORT FLEXHALER 180 MCG/ACT inhaler Inhale 1 puff into the lungs 2 (two) times daily.   RABEprazole  (ACIPHEX ) 20 MG tablet Take 1 tablet (20 mg total) by mouth 2 (two) times daily.   sucralfate  (CARAFATE ) 1 g tablet TAKE 1 TABLET (1 G TOTAL) BY MOUTH 4 TIMES A DAY WITH MEALS AND AT BEDTIME   tamsulosin  (FLOMAX ) 0.4 MG CAPS capsule TAKE 1 CAPSULE AT BEDTIME FOR PROSTATE   triamcinolone cream (  KENALOG) 0.1 % Apply 1 application topically 2 (two) times daily as needed for itching.   Vibegron (GEMTESA) 75 MG TABS Take by mouth daily.   Zinc  50 MG TABS Take 1 tablet Daily   azithromycin  (ZITHROMAX )  250 MG tablet Take 2 tablets with Food on  Day 1, then 1 tablet Daily with Food for Sinusitis / Bronchitis   predniSONE  (DELTASONE ) 10 MG tablet 1 tab 3 x day for 3 days, then 1 tab 2 x day for 3 days, then 1 tab 1 Daily   No current facility-administered medications on file prior to visit.    ROS: all negative except what is noted in the HPI.   Physical Exam:  BP 122/76   Pulse 64   Temp 97.8 F (36.6 C)   Ht 5' 9 (1.753 m)   Wt 164 lb 9.6 oz (74.7 kg)   SpO2 99%   BMI 24.31 kg/m   General Appearance: NAD.  Awake, conversant and cooperative. Eyes: PERRLA, EOMs intact.  Sclera white.  Conjunctiva without erythema. Sinuses: Frontal/maxillary tenderness.  No nasal discharge. Nares patent.  ENT/Mouth: Ext aud canals clear.  Bilateral TMs w/DOL and without erythema or bulging. Hearing intact.  Posterior pharynx without swelling or exudate.  Tonsils without swelling or erythema.  Neck: Supple.  No masses, nodules or thyromegaly. Respiratory: Effort is regular with non-labored breathing. Breath sounds are equal bilaterally without rales, rhonchi, wheezing or stridor.  Cardio: RRR with no MRGs. Brisk peripheral pulses without edema.  Abdomen: Active BS in all four quadrants.  Soft and non-tender without guarding, rebound tenderness, hernias or masses. Lymphatics: Non tender without lymphadenopathy.  Musculoskeletal: Full ROM, 5/5 strength, normal ambulation.  No clubbing or cyanosis. Skin: Appropriate color for ethnicity. Warm without rashes, lesions, ecchymosis, ulcers.  Neuro: CN II-XII grossly normal. Normal muscle tone without cerebellar symptoms and intact sensation.   Psych: AO X 3,  appropriate mood and affect, insight and judgment.     BASCOM NECESSARY, NP 4:01 PM [facility]

## 2023-11-07 ENCOUNTER — Encounter: Payer: Self-pay | Admitting: Nurse Practitioner

## 2023-11-07 ENCOUNTER — Ambulatory Visit (INDEPENDENT_AMBULATORY_CARE_PROVIDER_SITE_OTHER): Payer: Commercial Managed Care - PPO | Admitting: Nurse Practitioner

## 2023-11-07 VITALS — BP 122/76 | HR 64 | Temp 97.8°F | Ht 69.0 in | Wt 164.6 lb

## 2023-11-07 DIAGNOSIS — J069 Acute upper respiratory infection, unspecified: Secondary | ICD-10-CM | POA: Diagnosis not present

## 2023-11-07 DIAGNOSIS — J0410 Acute tracheitis without obstruction: Secondary | ICD-10-CM

## 2023-11-07 MED ORDER — PROMETHAZINE-DM 6.25-15 MG/5ML PO SYRP: 5.0000 mL | ORAL_SOLUTION | Freq: Four times a day (QID) | ORAL | 0 refills | Status: DC | PRN

## 2023-11-07 NOTE — Patient Instructions (Signed)

## 2023-11-09 ENCOUNTER — Ambulatory Visit: Payer: Commercial Managed Care - PPO | Admitting: Nurse Practitioner

## 2023-11-16 NOTE — Progress Notes (Signed)
 Assessment and Plan:  Jay Brooks was seen today for cough.  Diagnoses and all orders for this visit:  Essential hypertension - continue medications, DASH diet, exercise and monitor at home. Call if greater than 130/80.   Acute cough Continue current medications If symptoms worsen notify the office      Further disposition pending results of labs. Discussed med's effects and SE's.   Over 30 minutes of exam, counseling, chart review, and critical decision making was performed.   Future Appointments  Date Time Provider Department Center  02/19/2024 11:30 AM Laurice President, NP [facility] None  05/22/2024 11:00 AM Tonita Fallow, MD [facility] None  08/26/2024 11:00 AM Laurice President, NP [facility] None    ------------------------------------------------------------------------------------------------------------------   HPI BP 130/80   Pulse (!) 53   Temp 97.9 F (36.6 C)   Resp 17   Ht 5' 9 (1.753 m)   Wt 166 lb 9.6 oz (75.6 kg)   SpO2 99%   BMI 24.60 kg/m   63 y.o.male presents for ongoing cough since 11/01/23.He does take albuterol  as needed, Azelastine  nasal spray, pulmicort inhaler daily, flonase daily, singulair  daily, claritin daily. Also uses Aciphex  and Pepcid  .  He completed a Z pack from 11/07/23 and prednisone  from 10/30/23. His symptoms are improving slightly, no further sinus congestion. Cough remains dry and is difficult to stop . No production of mucus.  Denies body aches, fever. Does feel ears pop occasionally   BP well controlled with Ziac  2.6/6.25 mg every day  BP Readings from Last 3 Encounters:  11/17/23 130/80  11/07/23 122/76  10/30/23 126/72  Denies headaches, chest pain, shortness of breath and dizziness   BMI is Body mass index is 24.6 kg/m., he has been working on diet and exercise. Wt Readings from Last 3 Encounters:  11/17/23 166 lb 9.6 oz (75.6 kg)  11/07/23 164 lb 9.6 oz (74.7 kg)  10/30/23 169 lb 12.8 oz (77 kg)         Past  Medical History:  Diagnosis Date   Allergy    Chronic cough    Chronic sinusitis    Diverticulitis 2018   Mild   Diverticulosis of colon (without mention of hemorrhage)    Early satiety    Esophageal motility disorder 12/2018   Family history of adverse reaction to anesthesia    mom has PONV   GERD (gastroesophageal reflux disease)    History of kidney stones    2017   History of left inguinal hernia    Hypertension    Insomnia    Lyme disease    QUESTIONABLE   Pulmonary nodules 10/2018   Scattered tiny subpleural pulmonary nodules at the peripheral right lung base, largest 3 mm   Right inguinal hernia 2017   Small fat containing right inguinal hernia   Sleep apnea    no CPAP   Sleep deficient    Testosterone  deficiency    Vitamin D  deficiency    Wears glasses      Allergies  Allergen Reactions   Allegra [Fexofenadine] Other (See Comments)    Worsened insomnia   Benzoyl Peroxide     blisters   Decadron  [Dexamethasone ]     Reflux & Hiccups, GI upset   Doxycycline  Nausea Only   Levaquin  [Levofloxacin ] Nausea And Vomiting    Patient states that after he took levaquin  this past time he had nausea with vomiting    Current Outpatient Medications on File Prior to Visit  Medication Sig   albuterol  (VENTOLIN  HFA) 108 (  90 Base) MCG/ACT inhaler Inhale into the lungs every 6 (six) hours as needed for wheezing or shortness of breath.   Azelastine  HCl 137 MCG/SPRAY SOLN    azithromycin  (ZITHROMAX ) 250 MG tablet Take 2 tablets with Food on  Day 1, then 1 tablet Daily with Food for Sinusitis / Bronchitis   bisoprolol -hydrochlorothiazide  (ZIAC ) 2.5-6.25 MG tablet Take  1 tablet  Daily  for BP                                                                 /                                                                   TAKE                                         BY                                                 MOUTH   calcium carbonate (TUMS EX) 750 MG chewable tablet Chew 1  tablet by mouth daily.   cholecalciferol (VITAMIN D ) 25 MCG (1000 UNIT) tablet Take 1,000 Units by mouth daily. 10,000 units   clindamycin (CLEOCIN T) 1 % lotion Apply 1 application topically every evening.   cycloSPORINE (RESTASIS OP) Apply to eye 2 (two) times daily.   famotidine  (PEPCID ) 20 MG tablet Take 20 mg by mouth daily.   fluticasone  (FLONASE) 50 MCG/ACT nasal spray Place 2 sprays into both nostrils daily.    hydrocortisone  cream 1 % Apply 1 application topically daily as needed for itching.   loratadine (CLARITIN) 10 MG tablet Take 10 mg by mouth daily.   montelukast  (SINGULAIR ) 10 MG tablet TAKE 1 TABLET BY MOUTH DAILY FOR ALLERGIES   Olopatadine HCl 0.2 % SOLN Place 1 drop into both eyes daily as needed.   promethazine -dextromethorphan (PROMETHAZINE -DM) 6.25-15 MG/5ML syrup Take 5 mLs by mouth 4 (four) times daily as needed for cough.   PULMICORT FLEXHALER 180 MCG/ACT inhaler Inhale 1 puff into the lungs 2 (two) times daily.   RABEprazole  (ACIPHEX ) 20 MG tablet Take 1 tablet (20 mg total) by mouth 2 (two) times daily.   sucralfate  (CARAFATE ) 1 g tablet TAKE 1 TABLET (1 G TOTAL) BY MOUTH 4 TIMES A DAY WITH MEALS AND AT BEDTIME   tamsulosin  (FLOMAX ) 0.4 MG CAPS capsule TAKE 1 CAPSULE AT BEDTIME FOR PROSTATE   triamcinolone cream (KENALOG) 0.1 % Apply 1 application topically 2 (two) times daily as needed for itching.   Vibegron (GEMTESA) 75 MG TABS Take by mouth daily.   Zinc  50 MG TABS Take 1 tablet Daily   No current facility-administered medications on file prior to visit.    ROS: all negative except above.   Physical Exam:  BP 130/80   Pulse (!) 53   Temp 97.9 F (36.6 C)   Resp 17   Ht 5' 9 (1.753 m)   Wt 166 lb 9.6 oz (75.6 kg)   SpO2 99%   BMI 24.60 kg/m   General Appearance: Well nourished, in no apparent distress. Eyes: PERRLA, EOMs, conjunctiva no swelling or erythema Sinuses: No Frontal/maxillary tenderness ENT/Mouth: Ext aud canals clear, TMs without  erythema, bulging. No erythema, swelling, or exudate on post pharynx.  Tonsils not swollen or erythematous. Hearing normal.  Neck: Supple, thyroid normal.  Respiratory: Respiratory effort normal, BS equal bilaterally without rales, rhonchi, wheezing or stridor.  Cardio: RRR with no MRGs. Brisk peripheral pulses without edema.  Abdomen: Soft, + BS.  Lymphatics: Non tender without lymphadenopathy.  Musculoskeletal: Full ROM, 5/5 strength, normal gait.  Skin: Warm, dry without rashes, lesions, ecchymosis.  Neuro: Cranial nerves intact. Normal muscle tone, no cerebellar symptoms. Sensation intact.  Psych: Awake and oriented X 3, normal affect, Insight and Judgment appropriate.     Safi Culotta E Warnell Rasnic, NP 11:02 AM [facility]

## 2023-11-17 ENCOUNTER — Ambulatory Visit (INDEPENDENT_AMBULATORY_CARE_PROVIDER_SITE_OTHER): Payer: Commercial Managed Care - PPO | Admitting: Nurse Practitioner

## 2023-11-17 ENCOUNTER — Encounter: Payer: Self-pay | Admitting: Nurse Practitioner

## 2023-11-17 ENCOUNTER — Other Ambulatory Visit: Payer: Self-pay

## 2023-11-17 VITALS — BP 130/80 | HR 53 | Temp 97.9°F | Resp 17 | Ht 69.0 in | Wt 166.6 lb

## 2023-11-17 DIAGNOSIS — R051 Acute cough: Secondary | ICD-10-CM | POA: Diagnosis not present

## 2023-11-17 DIAGNOSIS — I10 Essential (primary) hypertension: Secondary | ICD-10-CM

## 2023-11-17 NOTE — Patient Instructions (Signed)
 Continue all current medications  If symptoms worsen notify the office  Cough, Adult Coughing is a reflex that clears your throat and airways (respiratory system). It helps heal and protect your lungs. It is normal to cough from time to time. A cough that happens with other symptoms or that lasts a long time may be a sign of a condition that needs treatment. A short-term (acute) cough may only last 2-3 weeks. A long-term (chronic) cough may last 8 or more weeks. Coughing is often caused by: Diseases, such as: An infection of the respiratory system. Asthma or other heart or lung diseases. Gastroesophageal reflux. This is when acid comes back up from the stomach. Breathing in things that irritate your lungs. Allergies. Postnasal drip. This is when mucus runs down the back of your throat. Smoking. Some medicines. Follow these instructions at home: Medicines Take over-the-counter and prescription medicines only as told by your health care provider. Talk with your provider before you take cough medicine (cough suppressants). Eating and drinking Do not drink alcohol. Avoid caffeine. Drink enough fluid to keep your pee (urine) pale yellow. Lifestyle Avoid cigarette smoke. Do not use any products that contain nicotine or tobacco. These products include cigarettes, chewing tobacco, and vaping devices, such as e-cigarettes. If you need help quitting, ask your provider. Avoid things that make you cough. These may include perfumes, candles, cleaning products, or campfire smoke. General instructions  Watch for any changes to your cough. Tell your provider about them. Always cover your mouth when you cough. If the air is dry in your bedroom or home, use a cool mist vaporizer or humidifier. If your cough is worse at night, try to sleep in a semi-upright position. Rest as needed. Contact a health care provider if: You have new symptoms, or your symptoms get worse. You cough up pus. You have a  fever that does not go away or a cough that does not get better after 2-3 weeks. You cannot control your cough with medicine, and you are losing sleep. You have pain that gets worse or is not helped with medicine. You lose weight for no clear reason. You have night sweats. Get help right away if: You cough up blood. You have trouble breathing. Your heart is beating very fast. These symptoms may be an emergency. Get help right away. Call 911. Do not wait to see if the symptoms will go away. Do not drive yourself to the hospital. This information is not intended to replace advice given to you by your health care provider. Make sure you discuss any questions you have with your health care provider. Document Revised: 06/24/2022 Document Reviewed: 06/24/2022 Elsevier Patient Education  2024 [employer].

## 2023-11-27 ENCOUNTER — Encounter: Payer: Self-pay | Admitting: Nurse Practitioner

## 2023-11-28 ENCOUNTER — Ambulatory Visit
Admission: RE | Admit: 2023-11-28 | Discharge: 2023-11-28 | Disposition: A | Discharge status: Home / Self Care | Payer: Commercial Managed Care - PPO | Source: Ambulatory Visit | Attending: Nurse Practitioner | Admitting: Nurse Practitioner

## 2023-11-28 DIAGNOSIS — J069 Acute upper respiratory infection, unspecified: Secondary | ICD-10-CM

## 2023-12-04 ENCOUNTER — Encounter: Payer: Self-pay | Admitting: Nurse Practitioner

## 2023-12-06 ENCOUNTER — Other Ambulatory Visit: Payer: Self-pay | Admitting: Nurse Practitioner

## 2023-12-08 ENCOUNTER — Other Ambulatory Visit: Payer: Self-pay | Admitting: Gastroenterology

## 2023-12-27 ENCOUNTER — Other Ambulatory Visit: Payer: Self-pay | Admitting: Gastroenterology

## 2024-01-15 ENCOUNTER — Ambulatory Visit: Payer: Commercial Managed Care - PPO | Admitting: Nurse Practitioner

## 2024-02-14 ENCOUNTER — Telehealth: Payer: Self-pay | Admitting: Gastroenterology

## 2024-02-14 NOTE — Telephone Encounter (Signed)
 Inbound call from patient, wishing to be seen urgently due to a "lump" that he feels on his left side. He states he feels a bump and its painful and is not sure what it is. Patient states his mother died from colon cancer and he is anxious about what it could possibly be. Would like to speak to a nurse to further advise.

## 2024-02-15 NOTE — Telephone Encounter (Signed)
 Spoke with the pt and he tells me that he had a discomfort in the abd as well as a "lump". He tells me that he feels much better today and does not think that he needs anything further.  I did offer to make an appt to have on the books in case the discomfort returns.  He agrees and the appt has been made for 5/14 at 10 am.  He will call back if he has any further questions or concerns.

## 2024-02-16 ENCOUNTER — Ambulatory Visit: Admitting: Nurse Practitioner

## 2024-02-19 ENCOUNTER — Ambulatory Visit: Payer: Commercial Managed Care - PPO | Admitting: Nurse Practitioner

## 2024-02-26 ENCOUNTER — Ambulatory Visit: Payer: Commercial Managed Care - PPO | Admitting: Family Medicine

## 2024-03-06 DIAGNOSIS — R051 Acute cough: Secondary | ICD-10-CM | POA: Diagnosis not present

## 2024-03-06 DIAGNOSIS — J069 Acute upper respiratory infection, unspecified: Secondary | ICD-10-CM | POA: Diagnosis not present

## 2024-03-06 DIAGNOSIS — R0982 Postnasal drip: Secondary | ICD-10-CM | POA: Diagnosis not present

## 2024-03-15 DIAGNOSIS — I10 Essential (primary) hypertension: Secondary | ICD-10-CM | POA: Diagnosis not present

## 2024-03-15 DIAGNOSIS — J45909 Unspecified asthma, uncomplicated: Secondary | ICD-10-CM | POA: Diagnosis not present

## 2024-03-15 DIAGNOSIS — G4733 Obstructive sleep apnea (adult) (pediatric): Secondary | ICD-10-CM | POA: Diagnosis not present

## 2024-03-15 DIAGNOSIS — H409 Unspecified glaucoma: Secondary | ICD-10-CM | POA: Diagnosis not present

## 2024-03-15 DIAGNOSIS — Z0000 Encounter for general adult medical examination without abnormal findings: Secondary | ICD-10-CM | POA: Diagnosis not present

## 2024-03-15 DIAGNOSIS — K219 Gastro-esophageal reflux disease without esophagitis: Secondary | ICD-10-CM | POA: Diagnosis not present

## 2024-03-15 DIAGNOSIS — Z136 Encounter for screening for cardiovascular disorders: Secondary | ICD-10-CM | POA: Diagnosis not present

## 2024-03-19 DIAGNOSIS — J3089 Other allergic rhinitis: Secondary | ICD-10-CM | POA: Diagnosis not present

## 2024-03-19 DIAGNOSIS — J4530 Mild persistent asthma, uncomplicated: Secondary | ICD-10-CM | POA: Diagnosis not present

## 2024-03-19 DIAGNOSIS — H1045 Other chronic allergic conjunctivitis: Secondary | ICD-10-CM | POA: Diagnosis not present

## 2024-03-19 DIAGNOSIS — J301 Allergic rhinitis due to pollen: Secondary | ICD-10-CM | POA: Diagnosis not present

## 2024-03-20 ENCOUNTER — Ambulatory Visit: Admitting: Gastroenterology

## 2024-03-20 ENCOUNTER — Encounter: Payer: Self-pay | Admitting: Gastroenterology

## 2024-03-20 VITALS — BP 114/68 | HR 52 | Ht 69.0 in | Wt 171.0 lb

## 2024-03-20 DIAGNOSIS — K219 Gastro-esophageal reflux disease without esophagitis: Secondary | ICD-10-CM | POA: Diagnosis not present

## 2024-03-20 DIAGNOSIS — K5900 Constipation, unspecified: Secondary | ICD-10-CM

## 2024-03-20 DIAGNOSIS — K649 Unspecified hemorrhoids: Secondary | ICD-10-CM | POA: Diagnosis not present

## 2024-03-20 DIAGNOSIS — R12 Heartburn: Secondary | ICD-10-CM

## 2024-03-20 NOTE — Patient Instructions (Addendum)
 Start Miralax 1 capful daily in 8 ounces of liquid.  You have been scheduled for an endoscopy. Please follow written instructions given to you at your visit today.  If you use inhalers (even only as needed), please bring them with you on the day of your procedure.  If you take any of the following medications, they will need to be adjusted prior to your procedure:   DO NOT TAKE 7 DAYS PRIOR TO TEST- Trulicity (dulaglutide) Ozempic, Wegovy (semaglutide) Mounjaro (tirzepatide) Bydureon Bcise (exanatide extended release)  DO NOT TAKE 1 DAY PRIOR TO YOUR TEST Rybelsus (semaglutide) Adlyxin (lixisenatide) Victoza (liraglutide) Byetta (exanatide) _______________________________________________________  If your blood pressure at your visit was 140/90 or greater, please contact your primary care physician to follow up on this.  _______________________________________________________  If you are age 61 or older, your body mass index should be between 23-30. Your Body mass index is 25.25 kg/m. If this is out of the aforementioned range listed, please consider follow up with your Primary Care Provider.  If you are age 76 or younger, your body mass index should be between 19-25. Your Body mass index is 25.25 kg/m. If this is out of the aformentioned range listed, please consider follow up with your Primary Care Provider.   ________________________________________________________  The [facility] providers would like to encourage you to use MYCHART to communicate with providers for non-urgent requests or questions.  Due to long hold times on the telephone, sending your provider a message by [facility] may be a faster and more efficient way to get a response.  Please allow 48 business hours for a response.  Please remember that this is for non-urgent requests.  _______________________________________________________

## 2024-03-20 NOTE — Progress Notes (Unsigned)
 03/20/2024 Jay Brooks [id number] December 01, 1960   HISTORY OF PRESENT ILLNESS:  07/2021:  - Diverticulosis in the left colon. - Small internal hemorrhoids. - The examination was otherwise normal on direct and retroflexion views. - No polyps or cancers.  Past Medical History:  Diagnosis Date   Allergy    Chronic cough    Chronic sinusitis    Diverticulitis 2018   Mild   Diverticulosis of colon (without mention of hemorrhage)    Early satiety    Esophageal motility disorder 12/2018   Family history of adverse reaction to anesthesia    mom has PONV   GERD (gastroesophageal reflux disease)    History of kidney stones    2017   History of left inguinal hernia    Hypertension    Insomnia    Lyme disease    QUESTIONABLE   Pulmonary nodules 10/2018   Scattered tiny subpleural pulmonary nodules at the peripheral right lung base, largest 3 mm   Right inguinal hernia 2017   Small fat containing right inguinal hernia   Sleep apnea    CPAP   Sleep deficient    Testosterone  deficiency    Vitamin D  deficiency    Wears glasses    Past Surgical History:  Procedure Laterality Date   BRAVO PH STUDY N/A 05/02/2019   Procedure: BRAVO PH STUDY;  Surgeon: Janel Medford, MD;  Location: [facility];  Service: Endoscopy;  Laterality: N/A;   COLONOSCOPY  02/2011   ESOPHAGOGASTRODUODENOSCOPY (EGD) WITH PROPOFOL  N/A 05/02/2019   Procedure: ESOPHAGOGASTRODUODENOSCOPY (EGD) WITH PROPOFOL ;  Surgeon: Janel Medford, MD;  Location: [facility];  Service: Endoscopy;  Laterality: N/A;   HERNIA REPAIR     NASAL SEPTOPLASTY W/ TURBINOPLASTY Bilateral 04/21/2021   Procedure: NASAL SEPTOPLASTY WITH  BILATERAL TURBINATE REDUCTION;  Surgeon: Reynold Caves, MD;  Location: [facility];  Service: ENT;  Laterality: Bilateral;   TOE SURGERY     INGROWN TOE NAIL, CHILDHOOD   UPPER GASTROINTESTINAL ENDOSCOPY     VASECTOMY      reports that he has never smoked. He has never used smokeless tobacco. He reports  current alcohol use of about 2.0 - 3.0 standard drinks of alcohol per week. He reports that he does not use drugs. family history includes Breast cancer in his maternal grandmother; Cancer (age of onset: 107) in his mother; Colon polyps in his brother and maternal uncle. Allergies  Allergen Reactions   Allegra [Fexofenadine] Other (See Comments)    Worsened insomnia   Benzoyl Peroxide     blisters   Decadron  [Dexamethasone ]     Reflux & Hiccups, GI upset   Doxycycline  Nausea Only   Levaquin  [Levofloxacin ] Nausea And Vomiting    Patient states that after he took levaquin  this past time he had nausea with vomiting      Outpatient Encounter Medications as of 03/20/2024  Medication Sig   albuterol  (VENTOLIN  HFA) 108 (90 Base) MCG/ACT inhaler Inhale into the lungs every 6 (six) hours as needed for wheezing or shortness of breath.   Azelastine  HCl 137 MCG/SPRAY SOLN as needed.   bisoprolol -hydrochlorothiazide  (ZIAC ) 2.5-6.25 MG tablet Take  1 tablet  Daily  for BP                                                                 /  TAKE                                         BY                                                 MOUTH   calcium carbonate (TUMS EX) 750 MG chewable tablet Chew 1 tablet by mouth daily.   cholecalciferol (VITAMIN D ) 25 MCG (1000 UNIT) tablet Take 1,000 Units by mouth daily. 10,000 units   clindamycin (CLEOCIN T) 1 % lotion Apply 1 application  topically at bedtime as needed.   famotidine  (PEPCID ) 20 MG tablet Take 20 mg by mouth daily.   fluticasone  (FLONASE) 50 MCG/ACT nasal spray Place 2 sprays into both nostrils daily.    hydrocortisone cream 1 % Apply 1 application topically daily as needed for itching.   loratadine (CLARITIN) 10 MG tablet Take 10 mg by mouth daily.   montelukast  (SINGULAIR ) 10 MG tablet TAKE 1 TABLET BY MOUTH DAILY FOR ALLERGIES   Olopatadine HCl 0.2 % SOLN Place 1 drop into both eyes  daily as needed.   PULMICORT FLEXHALER 180 MCG/ACT inhaler Inhale 1 puff into the lungs 2 (two) times daily.   RABEprazole  (ACIPHEX ) 20 MG tablet TAKE 1 TABLET BY MOUTH TWICE A DAY   sucralfate  (CARAFATE ) 1 g tablet TAKE 1 TABLET (1 G TOTAL) BY MOUTH 4 TIMES A DAY WITH MEALS AND AT BEDTIME (Patient taking differently: daily as needed.)   tamsulosin  (FLOMAX ) 0.4 MG CAPS capsule TAKE 1 CAPSULE AT BEDTIME FOR PROSTATE   triamcinolone cream (KENALOG) 0.1 % Apply 1 application topically 2 (two) times daily as needed for itching.   Varenicline Tartrate (TYRVAYA) 0.03 MG/ACT SOLN Place 1 spray into the nose 2 (two) times daily.   Zinc  50 MG TABS Take 1 tablet Daily   promethazine -dextromethorphan (PROMETHAZINE -DM) 6.25-15 MG/5ML syrup Take 5 mLs by mouth 4 (four) times daily as needed for cough. (Patient not taking: Reported on 03/20/2024)   [DISCONTINUED] azithromycin  (ZITHROMAX ) 250 MG tablet Take 2 tablets with Food on  Day 1, then 1 tablet Daily with Food for Sinusitis / Bronchitis   [DISCONTINUED] cycloSPORINE (RESTASIS OP) Apply to eye 2 (two) times daily.   [DISCONTINUED] Vibegron (GEMTESA) 75 MG TABS Take by mouth daily.   No facility-administered encounter medications on file as of 03/20/2024.     REVIEW OF SYSTEMS  : All other systems reviewed and negative except where noted in the History of Present Illness.   PHYSICAL EXAM: BP 114/68   Pulse (!) 52   Ht 5\' 9"  (1.753 m)   Wt 171 lb (77.6 kg)   SpO2 98%   BMI 25.25 kg/m  General: Well developed white male in no acute distress Head: Normocephalic and atraumatic Eyes:  sclerae anicteric,conjunctive pink. Ears: Normal auditory acuity Neck: Supple, no masses.  Lungs: Clear throughout to auscultation Heart: Regular rate and rhythm Abdomen: Soft, nontender, non distended. No masses or hepatomegaly noted. Normal bowel sounds Rectal: *** Musculoskeletal: Symmetrical with no gross deformities  Skin: No lesions on visible  extremities Extremities: No edema  Neurological: Alert oriented x 4, grossly nonfocal Cervical Nodes:  No significant cervical adenopathy Psychological:  Alert and cooperative. Normal mood and affect  ASSESSMENT AND PLAN:  CC:  Vangie Genet, MD

## 2024-03-22 ENCOUNTER — Encounter: Payer: Self-pay | Admitting: Gastroenterology

## 2024-03-22 DIAGNOSIS — K5900 Constipation, unspecified: Secondary | ICD-10-CM | POA: Insufficient documentation

## 2024-03-23 NOTE — Progress Notes (Signed)
 Attending Physician's Attestation   I have reviewed the chart.   I agree with the Advanced Practitioner's note, impression, and recommendations with any updates as below. I think that we can proceed with repeat endoscopic evaluation.  If normal, then consider NERD and esophageal hypersensitivity and see if Voquenza can be considered for possible NERD and role of pH impedence testing as outlined.   Yong Henle, MD [facility] Advanced Endoscopy Office # [phone]

## 2024-03-25 DIAGNOSIS — M9904 Segmental and somatic dysfunction of sacral region: Secondary | ICD-10-CM | POA: Diagnosis not present

## 2024-03-25 DIAGNOSIS — M9901 Segmental and somatic dysfunction of cervical region: Secondary | ICD-10-CM | POA: Diagnosis not present

## 2024-03-25 DIAGNOSIS — M9903 Segmental and somatic dysfunction of lumbar region: Secondary | ICD-10-CM | POA: Diagnosis not present

## 2024-03-25 DIAGNOSIS — M9902 Segmental and somatic dysfunction of thoracic region: Secondary | ICD-10-CM | POA: Diagnosis not present

## 2024-03-26 ENCOUNTER — Telehealth: Payer: Self-pay | Admitting: Gastroenterology

## 2024-03-26 NOTE — Telephone Encounter (Signed)
 Patient requesting f/u call in regards to upcoming procedure. Please advise.

## 2024-03-26 NOTE — Telephone Encounter (Signed)
 Jay Brooks wants to know what is his part of EGD cost. I gave him the CPT code: 16109 to call his insurance. I will message our pre-cert people to see if they know more information. He just went on Medicare.

## 2024-03-27 ENCOUNTER — Encounter: Payer: Self-pay | Admitting: Gastroenterology

## 2024-03-27 ENCOUNTER — Ambulatory Visit (AMBULATORY_SURGERY_CENTER): Admitting: Gastroenterology

## 2024-03-27 VITALS — BP 104/58 | HR 55 | Temp 97.7°F | Resp 14 | Ht 69.0 in | Wt 171.0 lb

## 2024-03-27 DIAGNOSIS — K319 Disease of stomach and duodenum, unspecified: Secondary | ICD-10-CM | POA: Diagnosis not present

## 2024-03-27 DIAGNOSIS — K219 Gastro-esophageal reflux disease without esophagitis: Secondary | ICD-10-CM | POA: Diagnosis not present

## 2024-03-27 DIAGNOSIS — R12 Heartburn: Secondary | ICD-10-CM

## 2024-03-27 DIAGNOSIS — K317 Polyp of stomach and duodenum: Secondary | ICD-10-CM | POA: Diagnosis not present

## 2024-03-27 DIAGNOSIS — K2289 Other specified disease of esophagus: Secondary | ICD-10-CM

## 2024-03-27 DIAGNOSIS — K449 Diaphragmatic hernia without obstruction or gangrene: Secondary | ICD-10-CM

## 2024-03-27 MED ORDER — SODIUM CHLORIDE 0.9 % IV SOLN: 500.0000 mL | Freq: Once | INTRAVENOUS | Status: DC

## 2024-03-27 NOTE — Progress Notes (Signed)
 Called to room to assist during endoscopic procedure.  Patient ID and intended procedure confirmed with present staff. Received instructions for my participation in the procedure from the performing physician.

## 2024-03-27 NOTE — Progress Notes (Signed)
 GASTROENTEROLOGY PROCEDURE H&P NOTE   Primary Care Physician: Vangie Genet, MD  HPI: Jay Brooks is a 63 y.o. male who presents for EGD for evaluation of persisting GERD symptoms on PPI will need to consider if esophagitis present v if this is NERD or Esophageal hypersensitivity.  Past Medical History:  Diagnosis Date   Allergy    Chronic cough    Chronic sinusitis    Diverticulitis 2018   Mild   Diverticulosis of colon (without mention of hemorrhage)    Early satiety    Esophageal motility disorder 12/2018   Family history of adverse reaction to anesthesia    mom has PONV   GERD (gastroesophageal reflux disease)    History of kidney stones    2017   History of left inguinal hernia    Hypertension    Insomnia    Lyme disease    QUESTIONABLE   Pulmonary nodules 10/2018   Scattered tiny subpleural pulmonary nodules at the peripheral right lung base, largest 3 mm   Right inguinal hernia 2017   Small fat containing right inguinal hernia   Sleep apnea    CPAP   Sleep deficient    Testosterone  deficiency    Vitamin D  deficiency    Wears glasses    Past Surgical History:  Procedure Laterality Date   BRAVO PH STUDY N/A 05/02/2019   Procedure: BRAVO PH STUDY;  Surgeon: Janel Medford, MD;  Location: [facility];  Service: Endoscopy;  Laterality: N/A;   COLONOSCOPY  02/2011   ESOPHAGOGASTRODUODENOSCOPY (EGD) WITH PROPOFOL  N/A 05/02/2019   Procedure: ESOPHAGOGASTRODUODENOSCOPY (EGD) WITH PROPOFOL ;  Surgeon: Janel Medford, MD;  Location: [facility];  Service: Endoscopy;  Laterality: N/A;   HERNIA REPAIR     NASAL SEPTOPLASTY W/ TURBINOPLASTY Bilateral 04/21/2021   Procedure: NASAL SEPTOPLASTY WITH  BILATERAL TURBINATE REDUCTION;  Surgeon: Reynold Caves, MD;  Location: [facility];  Service: ENT;  Laterality: Bilateral;   TOE SURGERY     INGROWN TOE NAIL, CHILDHOOD   UPPER GASTROINTESTINAL ENDOSCOPY     VASECTOMY     Current Outpatient Medications  Medication Sig  Dispense Refill   albuterol  (VENTOLIN  HFA) 108 (90 Base) MCG/ACT inhaler Inhale into the lungs every 6 (six) hours as needed for wheezing or shortness of breath.     Azelastine  HCl 137 MCG/SPRAY SOLN as needed.     bisoprolol -hydrochlorothiazide  (ZIAC ) 2.5-6.25 MG tablet Take  1 tablet  Daily  for BP                                                                 /                                                                   TAKE                                         BY  MOUTH 90 tablet 3   calcium carbonate (TUMS EX) 750 MG chewable tablet Chew 1 tablet by mouth daily.     cholecalciferol (VITAMIN D ) 25 MCG (1000 UNIT) tablet Take 1,000 Units by mouth daily. 10,000 units     clindamycin (CLEOCIN T) 1 % lotion Apply 1 application  topically at bedtime as needed.     famotidine  (PEPCID ) 20 MG tablet Take 20 mg by mouth daily.     fluticasone  (FLONASE) 50 MCG/ACT nasal spray Place 2 sprays into both nostrils daily.      hydrocortisone cream 1 % Apply 1 application topically daily as needed for itching.     loratadine (CLARITIN) 10 MG tablet Take 10 mg by mouth daily.     montelukast  (SINGULAIR ) 10 MG tablet TAKE 1 TABLET BY MOUTH DAILY FOR ALLERGIES 90 tablet 2   Olopatadine HCl 0.2 % SOLN Place 1 drop into both eyes daily as needed.     promethazine -dextromethorphan (PROMETHAZINE -DM) 6.25-15 MG/5ML syrup Take 5 mLs by mouth 4 (four) times daily as needed for cough. (Patient not taking: Reported on 03/20/2024) 240 mL 0   PULMICORT FLEXHALER 180 MCG/ACT inhaler Inhale 1 puff into the lungs 2 (two) times daily.     RABEprazole  (ACIPHEX ) 20 MG tablet TAKE 1 TABLET BY MOUTH TWICE A DAY 180 tablet 4   sucralfate  (CARAFATE ) 1 g tablet TAKE 1 TABLET (1 G TOTAL) BY MOUTH 4 TIMES A DAY WITH MEALS AND AT BEDTIME (Patient taking differently: daily as needed.) 90 tablet 2   tamsulosin  (FLOMAX ) 0.4 MG CAPS capsule TAKE 1 CAPSULE AT BEDTIME FOR PROSTATE 90  capsule 2   triamcinolone cream (KENALOG) 0.1 % Apply 1 application topically 2 (two) times daily as needed for itching.     Varenicline Tartrate (TYRVAYA) 0.03 MG/ACT SOLN Place 1 spray into the nose 2 (two) times daily.     Zinc  50 MG TABS Take 1 tablet Daily  0   No current facility-administered medications for this visit.    Current Outpatient Medications:    albuterol  (VENTOLIN  HFA) 108 (90 Base) MCG/ACT inhaler, Inhale into the lungs every 6 (six) hours as needed for wheezing or shortness of breath., Disp: , Rfl:    Azelastine  HCl 137 MCG/SPRAY SOLN, as needed., Disp: , Rfl:    bisoprolol -hydrochlorothiazide  (ZIAC ) 2.5-6.25 MG tablet, Take  1 tablet  Daily  for BP                                                                 /                                                                   TAKE                                         BY  MOUTH, Disp: 90 tablet, Rfl: 3   calcium carbonate (TUMS EX) 750 MG chewable tablet, Chew 1 tablet by mouth daily., Disp: , Rfl:    cholecalciferol (VITAMIN D ) 25 MCG (1000 UNIT) tablet, Take 1,000 Units by mouth daily. 10,000 units, Disp: , Rfl:    clindamycin (CLEOCIN T) 1 % lotion, Apply 1 application  topically at bedtime as needed., Disp: , Rfl:    famotidine  (PEPCID ) 20 MG tablet, Take 20 mg by mouth daily., Disp: , Rfl:    fluticasone  (FLONASE) 50 MCG/ACT nasal spray, Place 2 sprays into both nostrils daily. , Disp: , Rfl:    hydrocortisone cream 1 %, Apply 1 application topically daily as needed for itching., Disp: , Rfl:    loratadine (CLARITIN) 10 MG tablet, Take 10 mg by mouth daily., Disp: , Rfl:    montelukast  (SINGULAIR ) 10 MG tablet, TAKE 1 TABLET BY MOUTH DAILY FOR ALLERGIES, Disp: 90 tablet, Rfl: 2   Olopatadine HCl 0.2 % SOLN, Place 1 drop into both eyes daily as needed., Disp: , Rfl:    promethazine -dextromethorphan (PROMETHAZINE -DM) 6.25-15 MG/5ML syrup, Take 5 mLs by mouth 4 (four)  times daily as needed for cough. (Patient not taking: Reported on 03/20/2024), Disp: 240 mL, Rfl: 0   PULMICORT FLEXHALER 180 MCG/ACT inhaler, Inhale 1 puff into the lungs 2 (two) times daily., Disp: , Rfl:    RABEprazole  (ACIPHEX ) 20 MG tablet, TAKE 1 TABLET BY MOUTH TWICE A DAY, Disp: 180 tablet, Rfl: 4   sucralfate  (CARAFATE ) 1 g tablet, TAKE 1 TABLET (1 G TOTAL) BY MOUTH 4 TIMES A DAY WITH MEALS AND AT BEDTIME (Patient taking differently: daily as needed.), Disp: 90 tablet, Rfl: 2   tamsulosin  (FLOMAX ) 0.4 MG CAPS capsule, TAKE 1 CAPSULE AT BEDTIME FOR PROSTATE, Disp: 90 capsule, Rfl: 2   triamcinolone cream (KENALOG) 0.1 %, Apply 1 application topically 2 (two) times daily as needed for itching., Disp: , Rfl:    Varenicline Tartrate (TYRVAYA) 0.03 MG/ACT SOLN, Place 1 spray into the nose 2 (two) times daily., Disp: , Rfl:    Zinc  50 MG TABS, Take 1 tablet Daily, Disp: , Rfl: 0 Allergies  Allergen Reactions   Allegra [Fexofenadine] Other (See Comments)    Worsened insomnia   Benzoyl Peroxide     blisters   Decadron  [Dexamethasone ]     Reflux & Hiccups, GI upset   Doxycycline  Nausea Only   Levaquin  [Levofloxacin ] Nausea And Vomiting    Patient states that after he took levaquin  this past time he had nausea with vomiting   Family History  Problem Relation Age of Onset   Cancer Mother 70       pancreatic   Colon polyps Brother    Breast cancer Maternal Grandmother    Colon polyps Maternal Uncle    Colon cancer Neg Hx    Esophageal cancer Neg Hx    Stomach cancer Neg Hx    Ulcerative colitis Neg Hx    Inflammatory bowel disease Neg Hx    Liver disease Neg Hx    Pancreatic cancer Neg Hx    Rectal cancer Neg Hx    Social History   Socioeconomic History   Marital status: Married    Spouse name: Not on file   Number of children: 1   Years of education: Not on file   Highest education level: Bachelor's degree (e.g., BA, AB, BS)  Occupational History   Occupation: PILOT     Employer: [employer]  Tobacco Use  Smoking status: Never   Smokeless tobacco: Never  Vaping Use   Vaping status: Never Used  Substance and Sexual Activity   Alcohol use: Yes    Alcohol/week: 2.0 - 3.0 standard drinks of alcohol    Types: 2 - 3 Cans of beer per week    Comment: rarely   Drug use: No   Sexual activity: Not on file  Other Topics Concern   Not on file  Social History Narrative   Daily caffeine    Social Drivers of Health   Financial Resource Strain: Low Risk  (02/14/2024)   Overall Financial Resource Strain (CARDIA)    Difficulty of Paying Living Expenses: Not hard at all  Food Insecurity: No Food Insecurity (02/14/2024)   Hunger Vital Sign    Worried About Running Out of Food in the Last Year: Never true    Ran Out of Food in the Last Year: Never true  Transportation Needs: No Transportation Needs (02/14/2024)   PRAPARE - Administrator, Civil Service (Medical): No    Lack of Transportation (Non-Medical): No  Physical Activity: Insufficiently Active (02/14/2024)   Exercise Vital Sign    Days of Exercise per Week: 2 days    Minutes of Exercise per Session: 30 min  Stress: No Stress Concern Present (02/14/2024)   [employer] of Occupational Health - Occupational Stress Questionnaire    Feeling of Stress : Not at all  Social Connections: Socially Integrated (02/14/2024)   Social Connection and Isolation Panel [NHANES]    Frequency of Communication with Friends and Family: Three times a week    Frequency of Social Gatherings with Friends and Family: Once a week    Attends Religious Services: More than 4 times per year    Active Member of [employer] or Organizations: Yes    Attends Engineer, structural: More than 4 times per year    Marital Status: Married  Catering manager Violence: Unknown (02/11/2022)   Received from [employer], [facility]   HITS    Physically Hurt: Not on file    Insult or Talk Down To: Not on file    Threaten Physical  Harm: Not on file    Scream or Curse: Not on file    Physical Exam: There were no vitals filed for this visit. There is no height or weight on file to calculate BMI. GEN: NAD EYE: Sclerae anicteric ENT: MMM CV: Non-tachycardic GI: Soft, NT/ND NEURO:  Alert & Oriented x 3  Lab Results: No results for input(s): "WBC", "HGB", "HCT", "PLT" in the last 72 hours. BMET No results for input(s): "NA", "K", "CL", "CO2", "GLUCOSE", "BUN", "CREATININE", "CALCIUM" in the last 72 hours. LFT No results for input(s): "PROT", "ALBUMIN", "AST", "ALT", "ALKPHOS", "BILITOT", "BILIDIR", "IBILI" in the last 72 hours. PT/INR No results for input(s): "LABPROT", "INR" in the last 72 hours.   Impression / Plan: This is a 63 y.o.male who presents for EGD for evaluation of persisting GERD symptoms on PPI will need to consider if esophagitis present v if this is NERD or Esophageal hypersensitivity.  The risks and benefits of endoscopic evaluation/treatment were discussed with the patient and/or family; these include but are not limited to the risk of perforation, infection, bleeding, missed lesions, lack of diagnosis, severe illness requiring hospitalization, as well as anesthesia and sedation related illnesses.  The patient's history has been reviewed, patient examined, no change in status, and deemed stable for procedure.  The patient and/or family is agreeable to  proceed.    Yong Henle, MD  [facility] Advanced Endoscopy Office # [phone]

## 2024-03-27 NOTE — Op Note (Signed)
  [facility] Patient Name: Jay Brooks Procedure Date: 03/27/2024 8:56 AM MRN: [id number] Endoscopist: Yong Henle , MD, [id number] Age: 63 Referring MD:  Date of Birth: December 10, 1960 Gender: Male Account #: [id number] Procedure:                Upper GI endoscopy Indications:              Heartburn, Suspected non-erosive esophageal reflux,                            Nausea Medicines:                Monitored Anesthesia Care Procedure:                Pre-Anesthesia Assessment:                           - Prior to the procedure, a History and Physical                            was performed, and patient medications and                            allergies were reviewed. The patient's tolerance of                            previous anesthesia was also reviewed. The risks                            and benefits of the procedure and the sedation                            options and risks were discussed with the patient.                            All questions were answered, and informed consent                            was obtained. Prior Anticoagulants: The patient has                            taken no anticoagulant or antiplatelet agents. ASA                            Grade Assessment: II - A patient with mild systemic                            disease. After reviewing the risks and benefits,                            the patient was deemed in satisfactory condition to                            undergo the procedure.  After obtaining informed consent, the endoscope was                            passed under direct vision. Throughout the                            procedure, the patient's blood pressure, pulse, and                            oxygen saturations were monitored continuously. The                            Olympus Scope SN [id number] was introduced through the                            mouth, and advanced to the second part  of duodenum.                            The upper GI endoscopy was accomplished without                            difficulty. The patient tolerated the procedure. Scope In: Scope Out: Findings:                 No gross lesions were noted in the entire                            esophagus. Biopsies were taken with a cold forceps                            for histology to rule out EOE/LE.                           The Z-line was regular and was found 38 cm from the                            incisors.                           A 2 cm hiatal hernia was present. This was only                            noted after patient began to hiccup during                            procedure.                           Multiple small semi-sessile polyps with no bleeding                            and no stigmata of recent bleeding were found in  the cardia, in the gastric fundus and in the                            gastric body.                           Patchy mildly erythematous mucosa without bleeding                            was found in the entire examined stomach. Biopsies                            were taken with a cold forceps for histology and                            Helicobacter pylori testing.                           No gross lesions were noted in the duodenal bulb,                            in the first portion of the duodenum and in the                            second portion of the duodenum. Biopsies were taken                            with a cold forceps for histology. Complications:            No immediate complications. Estimated Blood Loss:     Estimated blood loss was minimal. Impression:               - No gross lesions in the entire esophagus.                            Biopsied.                           - Z-line regular, 38 cm from the incisors.                           - 2 cm hiatal hernia noted after patient began to                             hiccup.                           - Multiple gastric polyps consistent with fundic                            gland polyps.                           - Erythematous mucosa in the stomach. Biopsied.                           -  No gross lesions in the duodenal bulb, in the                            first portion of the duodenum and in the second                            portion of the duodenum. Biopsied. Recommendation:           - The patient will be observed post-procedure,                            until all discharge criteria are met.                           - Discharge patient to home.                           - Patient has a contact number available for                            emergencies. The signs and symptoms of potential                            delayed complications were discussed with the                            patient. Return to normal activities tomorrow.                            Written discharge instructions were provided to the                            patient.                           - Resume previous diet.                           - Continue present medications.                           - After pathology returns, can discuss with patient                            further if symptoms of reflux even if not acid                            reflux are remaining, could consider transition                            from Aciphex  to Voquenza or nonerosive reflux                            disease treatment. If this is done and patient  continues to have symptoms could consider pH                            impedance testing to rule out esophageal                            hypersensitivity and functional heartburn.                           - In regards to persisting nausea symptoms would                            recommend a.m. cortisol evaluation in future (would                            not do today since he just  received sedation). May                            require us  to consider cross-sectional imaging to                            ensure no other abnormalities could be causing that                            symptom and treat symptomatically with antiemetics                            if needed in future (currently using Tums and is                            helpful when this symptom occurs).                           - The findings and recommendations were discussed                            with the patient.                           - The findings and recommendations were discussed                            with the patient's family. Yong Henle, MD 03/27/2024 9:21:46 AM

## 2024-03-27 NOTE — Patient Instructions (Signed)
 Discharge instructions given. Handout on Hiatal Hernia. Biopsies taken. Resume previous medications. YOU HAD AN ENDOSCOPIC PROCEDURE TODAY AT THE [facility]:   Refer to the procedure report that was given to you for any specific questions about what was found during the examination.  If the procedure report does not answer your questions, please call your gastroenterologist to clarify.  If you requested that your care partner not be given the details of your procedure findings, then the procedure report has been included in a sealed envelope for you to review at your convenience later.  YOU SHOULD EXPECT: Some feelings of bloating in the abdomen. Passage of more gas than usual.  Walking can help get rid of the air that was put into your GI tract during the procedure and reduce the bloating. If you had a lower endoscopy (such as a colonoscopy or flexible sigmoidoscopy) you may notice spotting of blood in your stool or on the toilet paper. If you underwent a bowel prep for your procedure, you may not have a normal bowel movement for a few days.  Please Note:  You might notice some irritation and congestion in your nose or some drainage.  This is from the oxygen used during your procedure.  There is no need for concern and it should clear up in a day or so.  SYMPTOMS TO REPORT IMMEDIATELY:   Following upper endoscopy (EGD)  Vomiting of blood or coffee ground material  New chest pain or pain under the shoulder blades  Painful or persistently difficult swallowing  New shortness of breath  Fever of 100F or higher  Black, tarry-looking stools  For urgent or emergent issues, a gastroenterologist can be reached at any hour by calling [phone]. Do not use MyChart messaging for urgent concerns.    DIET:  We do recommend a small meal at first, but then you may proceed to your regular diet.  Drink plenty of fluids but you should avoid alcoholic beverages for 24 hours.  ACTIVITY:   You should plan to take it easy for the rest of today and you should NOT DRIVE or use heavy machinery until tomorrow (because of the sedation medicines used during the test).    FOLLOW UP: Our staff will call the number listed on your records the next business day following your procedure.  We will call around 7:15- 8:00 am to check on you and address any questions or concerns that you may have regarding the information given to you following your procedure. If we do not reach you, we will leave a message.     If any biopsies were taken you will be contacted by phone or by letter within the next 1-3 weeks.  Please call us  at [phone] if you have not heard about the biopsies in 3 weeks.    SIGNATURES/CONFIDENTIALITY: You and/or your care partner have signed paperwork which will be entered into your electronic medical record.  These signatures attest to the fact that that the information above on your After Visit Summary has been reviewed and is understood.  Full responsibility of the confidentiality of this discharge information lies with you and/or your care-partner.

## 2024-03-27 NOTE — Progress Notes (Signed)
 Vss nad trans to pacu

## 2024-03-28 ENCOUNTER — Telehealth: Payer: Self-pay

## 2024-03-28 DIAGNOSIS — M9901 Segmental and somatic dysfunction of cervical region: Secondary | ICD-10-CM | POA: Diagnosis not present

## 2024-03-28 DIAGNOSIS — M9902 Segmental and somatic dysfunction of thoracic region: Secondary | ICD-10-CM | POA: Diagnosis not present

## 2024-03-28 DIAGNOSIS — M9904 Segmental and somatic dysfunction of sacral region: Secondary | ICD-10-CM | POA: Diagnosis not present

## 2024-03-28 DIAGNOSIS — M9903 Segmental and somatic dysfunction of lumbar region: Secondary | ICD-10-CM | POA: Diagnosis not present

## 2024-03-28 NOTE — Telephone Encounter (Signed)
  Follow up Call-     03/27/2024    8:39 AM 03/30/2022    9:06 AM 07/28/2021    8:41 AM  Call back number  Post procedure Call Back phone  # [phone]  Permission to leave phone message Yes Yes Yes     Patient questions:  Do you have a fever, pain , or abdominal swelling? No. Pain Score  0 *  Have you tolerated food without any problems? Yes.    Have you been able to return to your normal activities? Yes.    Do you have any questions about your discharge instructions: Diet   No. Medications  No. Follow up visit  No.  Do you have questions or concerns about your Care? No.  Actions: * If pain score is 4 or above: No action needed, pain <4.

## 2024-03-29 ENCOUNTER — Ambulatory Visit: Payer: Self-pay | Admitting: Gastroenterology

## 2024-03-29 LAB — SURGICAL PATHOLOGY

## 2024-04-02 DIAGNOSIS — M9901 Segmental and somatic dysfunction of cervical region: Secondary | ICD-10-CM | POA: Diagnosis not present

## 2024-04-02 DIAGNOSIS — M9902 Segmental and somatic dysfunction of thoracic region: Secondary | ICD-10-CM | POA: Diagnosis not present

## 2024-04-02 DIAGNOSIS — M9904 Segmental and somatic dysfunction of sacral region: Secondary | ICD-10-CM | POA: Diagnosis not present

## 2024-04-02 DIAGNOSIS — M9903 Segmental and somatic dysfunction of lumbar region: Secondary | ICD-10-CM | POA: Diagnosis not present

## 2024-04-04 DIAGNOSIS — M9902 Segmental and somatic dysfunction of thoracic region: Secondary | ICD-10-CM | POA: Diagnosis not present

## 2024-04-04 DIAGNOSIS — M9904 Segmental and somatic dysfunction of sacral region: Secondary | ICD-10-CM | POA: Diagnosis not present

## 2024-04-04 DIAGNOSIS — M9901 Segmental and somatic dysfunction of cervical region: Secondary | ICD-10-CM | POA: Diagnosis not present

## 2024-04-04 DIAGNOSIS — M9903 Segmental and somatic dysfunction of lumbar region: Secondary | ICD-10-CM | POA: Diagnosis not present

## 2024-04-09 DIAGNOSIS — M9902 Segmental and somatic dysfunction of thoracic region: Secondary | ICD-10-CM | POA: Diagnosis not present

## 2024-04-09 DIAGNOSIS — M9903 Segmental and somatic dysfunction of lumbar region: Secondary | ICD-10-CM | POA: Diagnosis not present

## 2024-04-09 DIAGNOSIS — M9904 Segmental and somatic dysfunction of sacral region: Secondary | ICD-10-CM | POA: Diagnosis not present

## 2024-04-09 DIAGNOSIS — M9901 Segmental and somatic dysfunction of cervical region: Secondary | ICD-10-CM | POA: Diagnosis not present

## 2024-04-18 ENCOUNTER — Encounter: Payer: Commercial Managed Care - PPO | Admitting: Internal Medicine

## 2024-04-18 DIAGNOSIS — M9904 Segmental and somatic dysfunction of sacral region: Secondary | ICD-10-CM | POA: Diagnosis not present

## 2024-04-18 DIAGNOSIS — M9901 Segmental and somatic dysfunction of cervical region: Secondary | ICD-10-CM | POA: Diagnosis not present

## 2024-04-18 DIAGNOSIS — M9902 Segmental and somatic dysfunction of thoracic region: Secondary | ICD-10-CM | POA: Diagnosis not present

## 2024-04-18 DIAGNOSIS — M9903 Segmental and somatic dysfunction of lumbar region: Secondary | ICD-10-CM | POA: Diagnosis not present

## 2024-04-23 DIAGNOSIS — M9901 Segmental and somatic dysfunction of cervical region: Secondary | ICD-10-CM | POA: Diagnosis not present

## 2024-04-23 DIAGNOSIS — M9904 Segmental and somatic dysfunction of sacral region: Secondary | ICD-10-CM | POA: Diagnosis not present

## 2024-04-23 DIAGNOSIS — M9902 Segmental and somatic dysfunction of thoracic region: Secondary | ICD-10-CM | POA: Diagnosis not present

## 2024-04-23 DIAGNOSIS — M9903 Segmental and somatic dysfunction of lumbar region: Secondary | ICD-10-CM | POA: Diagnosis not present

## 2024-04-25 DIAGNOSIS — M9904 Segmental and somatic dysfunction of sacral region: Secondary | ICD-10-CM | POA: Diagnosis not present

## 2024-04-25 DIAGNOSIS — M9901 Segmental and somatic dysfunction of cervical region: Secondary | ICD-10-CM | POA: Diagnosis not present

## 2024-04-25 DIAGNOSIS — M9902 Segmental and somatic dysfunction of thoracic region: Secondary | ICD-10-CM | POA: Diagnosis not present

## 2024-04-25 DIAGNOSIS — M9903 Segmental and somatic dysfunction of lumbar region: Secondary | ICD-10-CM | POA: Diagnosis not present

## 2024-04-30 DIAGNOSIS — M9904 Segmental and somatic dysfunction of sacral region: Secondary | ICD-10-CM | POA: Diagnosis not present

## 2024-04-30 DIAGNOSIS — M9901 Segmental and somatic dysfunction of cervical region: Secondary | ICD-10-CM | POA: Diagnosis not present

## 2024-04-30 DIAGNOSIS — M9903 Segmental and somatic dysfunction of lumbar region: Secondary | ICD-10-CM | POA: Diagnosis not present

## 2024-04-30 DIAGNOSIS — M9902 Segmental and somatic dysfunction of thoracic region: Secondary | ICD-10-CM | POA: Diagnosis not present

## 2024-05-02 DIAGNOSIS — M9902 Segmental and somatic dysfunction of thoracic region: Secondary | ICD-10-CM | POA: Diagnosis not present

## 2024-05-02 DIAGNOSIS — M9904 Segmental and somatic dysfunction of sacral region: Secondary | ICD-10-CM | POA: Diagnosis not present

## 2024-05-02 DIAGNOSIS — M9901 Segmental and somatic dysfunction of cervical region: Secondary | ICD-10-CM | POA: Diagnosis not present

## 2024-05-02 DIAGNOSIS — M9903 Segmental and somatic dysfunction of lumbar region: Secondary | ICD-10-CM | POA: Diagnosis not present

## 2024-05-03 DIAGNOSIS — H04123 Dry eye syndrome of bilateral lacrimal glands: Secondary | ICD-10-CM | POA: Diagnosis not present

## 2024-05-06 DIAGNOSIS — H04121 Dry eye syndrome of right lacrimal gland: Secondary | ICD-10-CM | POA: Diagnosis not present

## 2024-05-06 DIAGNOSIS — H04122 Dry eye syndrome of left lacrimal gland: Secondary | ICD-10-CM | POA: Diagnosis not present

## 2024-05-17 DIAGNOSIS — J069 Acute upper respiratory infection, unspecified: Secondary | ICD-10-CM | POA: Diagnosis not present

## 2024-05-22 ENCOUNTER — Encounter: Payer: Commercial Managed Care - PPO | Admitting: Internal Medicine

## 2024-05-29 ENCOUNTER — Institutional Professional Consult (permissible substitution): Admitting: Neurology

## 2024-05-29 DIAGNOSIS — K5792 Diverticulitis of intestine, part unspecified, without perforation or abscess without bleeding: Secondary | ICD-10-CM | POA: Diagnosis not present

## 2024-05-29 DIAGNOSIS — G4733 Obstructive sleep apnea (adult) (pediatric): Secondary | ICD-10-CM | POA: Diagnosis not present

## 2024-05-29 DIAGNOSIS — G4700 Insomnia, unspecified: Secondary | ICD-10-CM | POA: Diagnosis not present

## 2024-06-04 ENCOUNTER — Telehealth: Payer: Self-pay | Admitting: Gastroenterology

## 2024-06-04 DIAGNOSIS — K5792 Diverticulitis of intestine, part unspecified, without perforation or abscess without bleeding: Secondary | ICD-10-CM | POA: Diagnosis not present

## 2024-06-04 DIAGNOSIS — H6692 Otitis media, unspecified, left ear: Secondary | ICD-10-CM | POA: Diagnosis not present

## 2024-06-04 NOTE — Telephone Encounter (Signed)
 Patient requesting f/u call in regards to banding. Please advise.

## 2024-06-04 NOTE — Telephone Encounter (Signed)
 Dr Wilhelmenia ok to set up hemorrhoid banding for pt?

## 2024-06-04 NOTE — Telephone Encounter (Signed)
 Euna he can be scheduled see previous note- thank you

## 2024-06-04 NOTE — Telephone Encounter (Signed)
 Olam can you please set up pt with any provider that bands?

## 2024-06-04 NOTE — Telephone Encounter (Signed)
 Patient called and stated that he would like to schedule an appointment for a Hemorrhoid banding. Patient is requesting a call back. Please advise.

## 2024-06-04 NOTE — Telephone Encounter (Signed)
 Patient can be scheduled with any of the hemorrhoid banding providers. Let him know that they will do a formal anoscopy at the time of his visit to ensure safety and ability to perform the hemorrhoid banding. Thanks. GM

## 2024-06-06 ENCOUNTER — Ambulatory Visit (INDEPENDENT_AMBULATORY_CARE_PROVIDER_SITE_OTHER): Admitting: Otolaryngology

## 2024-06-18 ENCOUNTER — Ambulatory Visit: Admitting: Gastroenterology

## 2024-07-03 DIAGNOSIS — G4733 Obstructive sleep apnea (adult) (pediatric): Secondary | ICD-10-CM | POA: Diagnosis not present

## 2024-07-03 DIAGNOSIS — G4761 Periodic limb movement disorder: Secondary | ICD-10-CM | POA: Diagnosis not present

## 2024-07-17 DIAGNOSIS — L820 Inflamed seborrheic keratosis: Secondary | ICD-10-CM | POA: Diagnosis not present

## 2024-07-17 DIAGNOSIS — L821 Other seborrheic keratosis: Secondary | ICD-10-CM | POA: Diagnosis not present

## 2024-07-17 DIAGNOSIS — D171 Benign lipomatous neoplasm of skin and subcutaneous tissue of trunk: Secondary | ICD-10-CM | POA: Diagnosis not present

## 2024-07-17 DIAGNOSIS — D1801 Hemangioma of skin and subcutaneous tissue: Secondary | ICD-10-CM | POA: Diagnosis not present

## 2024-07-17 DIAGNOSIS — D2271 Melanocytic nevi of right lower limb, including hip: Secondary | ICD-10-CM | POA: Diagnosis not present

## 2024-07-17 DIAGNOSIS — L814 Other melanin hyperpigmentation: Secondary | ICD-10-CM | POA: Diagnosis not present

## 2024-07-17 DIAGNOSIS — D2262 Melanocytic nevi of left upper limb, including shoulder: Secondary | ICD-10-CM | POA: Diagnosis not present

## 2024-07-17 DIAGNOSIS — Z85828 Personal history of other malignant neoplasm of skin: Secondary | ICD-10-CM | POA: Diagnosis not present

## 2024-07-17 DIAGNOSIS — L570 Actinic keratosis: Secondary | ICD-10-CM | POA: Diagnosis not present

## 2024-07-17 DIAGNOSIS — D225 Melanocytic nevi of trunk: Secondary | ICD-10-CM | POA: Diagnosis not present

## 2024-07-23 DIAGNOSIS — M25562 Pain in left knee: Secondary | ICD-10-CM | POA: Diagnosis not present

## 2024-07-25 ENCOUNTER — Ambulatory Visit: Payer: Commercial Managed Care - PPO | Admitting: Nurse Practitioner

## 2024-07-31 ENCOUNTER — Encounter: Payer: Self-pay | Admitting: Gastroenterology

## 2024-07-31 ENCOUNTER — Ambulatory Visit: Admitting: Gastroenterology

## 2024-07-31 VITALS — BP 130/84 | HR 48 | Ht 69.0 in | Wt 168.2 lb

## 2024-07-31 DIAGNOSIS — K5909 Other constipation: Secondary | ICD-10-CM | POA: Diagnosis not present

## 2024-07-31 DIAGNOSIS — K641 Second degree hemorrhoids: Secondary | ICD-10-CM

## 2024-07-31 DIAGNOSIS — G4733 Obstructive sleep apnea (adult) (pediatric): Secondary | ICD-10-CM | POA: Diagnosis not present

## 2024-07-31 NOTE — Patient Instructions (Signed)
 _______________________________________________________  If your blood pressure at your visit was 140/90 or greater, please contact your primary care physician to follow up on this.  _______________________________________________________  If you are age 65 or older, your body mass index should be between 23-30. Your Body mass index is 24.84 kg/m. If this is out of the aforementioned range listed, please consider follow up with your Primary Care Provider.  If you are age 52 or younger, your body mass index should be between 19-25. Your Body mass index is 24.84 kg/m. If this is out of the aformentioned range listed, please consider follow up with your Primary Care Provider.   ________________________________________________________  The [facility] providers would like to encourage you to use MYCHART to communicate with providers for non-urgent requests or questions.  Due to long hold times on the telephone, sending your provider a message by [facility] may be a faster and more efficient way to get a response.  Please allow 48 business hours for a response.  Please remember that this is for non-urgent requests.  _______________________________________________________  [facility] is using a team-based approach to care.  Your team is made up of your doctor and two to three APPS. Our APPS (Nurse Practitioners and Physician Assistants) work with your physician to ensure care continuity for you. They are fully qualified to address your health concerns and develop a treatment plan. They communicate directly with your gastroenterologist to care for you. Seeing the Advanced Practice Practitioners on your physician's team can help you by facilitating care more promptly, often allowing for earlier appointments, access to diagnostic testing, procedures, and other specialty referrals.   HEMORRHOID BANDING PROCEDURE    FOLLOW-UP CARE   The procedure you have had should have been relatively  painless since the banding of the area involved does not have nerve endings and there is no pain sensation.  The rubber band cuts off the blood supply to the hemorrhoid and the band may fall off as soon as 48 hours after the banding (the band may occasionally be seen in the toilet bowl following a bowel movement). You may notice a temporary feeling of fullness in the rectum which should respond adequately to plain Tylenol  or Motrin.  Following the banding, avoid strenuous exercise that evening and resume full activity the next day.  A sitz bath (soaking in a warm tub) or bidet is soothing, and can be useful for cleansing the area after bowel movements.     To avoid constipation, take two tablespoons of natural wheat bran, natural oat bran, flax, Benefiber or any over the counter fiber supplement and increase your water intake to 7-8 glasses daily.    Unless you have been prescribed anorectal medication, do not put anything inside your rectum for two weeks: No suppositories, enemas, fingers, etc.  Occasionally, you may have more bleeding than usual after the banding procedure.  This is often from the untreated hemorrhoids rather than the treated one.  Don't be concerned if there is a tablespoon or so of blood.  If there is more blood than this, lie flat with your bottom higher than your head and apply an ice pack to the area. If the bleeding does not stop within a half an hour or if you feel faint, call our office at [phone] or go to the emergency room.  Problems are not common; however, if there is a substantial amount of bleeding, severe pain, chills, fever or difficulty passing urine (very rare) or other problems, you should  call us  at [phone] or report to the nearest emergency room.  Do not stay seated continuously for more than 2-3 hours for a day or two after the procedure.  Tighten your buttock muscles 10-15 times every two hours and take 10-15 deep breaths every 1-2 hours.  Do  not spend more than a few minutes on the toilet if you cannot empty your bowel; instead re-visit the toilet at a later time.   It was a pleasure to see you today!  Vito Cirigliano, D.O.

## 2024-07-31 NOTE — Progress Notes (Signed)
 Chief Complaint:    Symptomatic Internal Hemorrhoids; Hemorrhoid Band Ligation   HPI:     Patient is a 63 y.o. male with a history of symptomatic internal hemorrhoids referred to me by Dr. Wilhelmenia for consideration of hemorrhoid banding.  Patient has a longstanding history of symptomatic grade 2 hemorrhoids, unresponsive to maximal medical therapy, and is requesting rubber band ligation of symptomatic hemorrhoidal disease. Medications have been less efficacious over time.   History of constipation, treated with MiraLAX 1 cap daily with improvement.  Endoscopic History: - EGD 03/2022: Mild, non- specific distal gastritis. Biopsied to check for H. pylori. - A few small ( 4- 8mm) soft, fleshy, classic appearing fundic gland polyps in the proximal stomach. - The examination was otherwise normal.   - Colonoscopy 07/2021: Diverticulosis in the left colon. Small internal hemorrhoids. The examination was otherwise normal on direct and retroflexion views.  Repeat 10 years.  - EGD 03/2024: Normal esophagus, 2 cm hiatal hernia, multiple small fundic land polyps, mild non-H. pylori gastritis, normal duodenum with benign path   Review of systems:     No chest pain, no SOB, no fevers, no urinary sx   Past Medical History:  Diagnosis Date   Allergy    Asthma    Chronic cough    Chronic sinusitis    Diverticulitis 2018   Mild   Diverticulosis of colon (without mention of hemorrhage)    Early satiety    Esophageal motility disorder 12/2018   Family history of adverse reaction to anesthesia    mom has PONV   GERD (gastroesophageal reflux disease)    History of kidney stones    2017   History of left inguinal hernia    Hypertension    Insomnia    Lyme disease    QUESTIONABLE   Pulmonary nodules 10/2018   Scattered tiny subpleural pulmonary nodules at the peripheral right lung base, largest 3 mm   Right inguinal hernia 2017   Small fat containing right inguinal hernia   Sleep apnea     CPAP   Sleep deficient    Testosterone  deficiency    Vitamin D  deficiency    Wears glasses     Patient's surgical history, family medical history, social history, medications and allergies were all reviewed in Epic    Current Outpatient Medications  Medication Sig Dispense Refill   albuterol  (VENTOLIN  HFA) 108 (90 Base) MCG/ACT inhaler Inhale into the lungs every 6 (six) hours as needed for wheezing or shortness of breath.     Azelastine  HCl 137 MCG/SPRAY SOLN as needed.     bisoprolol -hydrochlorothiazide  (ZIAC ) 2.5-6.25 MG tablet Take  1 tablet  Daily  for BP                                                                 /                                                                   TAKE  BY                                                 MOUTH 90 tablet 3   calcium carbonate (TUMS EX) 750 MG chewable tablet Chew 1 tablet by mouth daily.     celecoxib (CELEBREX) 200 MG capsule Take 200 mg by mouth daily.     cholecalciferol (VITAMIN D ) 25 MCG (1000 UNIT) tablet Take 1,000 Units by mouth daily. 10,000 units     clindamycin (CLEOCIN T) 1 % lotion Apply 1 application  topically at bedtime as needed.     famotidine  (PEPCID ) 20 MG tablet Take 20 mg by mouth daily.     fluticasone  (FLONASE) 50 MCG/ACT nasal spray Place 2 sprays into both nostrils daily.      hydrocortisone cream 1 % Apply 1 application topically daily as needed for itching.     loratadine (CLARITIN) 10 MG tablet Take 10 mg by mouth daily.     montelukast  (SINGULAIR ) 10 MG tablet TAKE 1 TABLET BY MOUTH DAILY FOR ALLERGIES 90 tablet 2   Olopatadine HCl 0.2 % SOLN Place 1 drop into both eyes daily as needed.     PULMICORT FLEXHALER 180 MCG/ACT inhaler Inhale 1 puff into the lungs 2 (two) times daily.     RABEprazole  (ACIPHEX ) 20 MG tablet TAKE 1 TABLET BY MOUTH TWICE A DAY 180 tablet 4   tamsulosin  (FLOMAX ) 0.4 MG CAPS capsule TAKE 1 CAPSULE AT BEDTIME FOR PROSTATE 90 capsule 2    triamcinolone cream (KENALOG) 0.1 % Apply 1 application topically 2 (two) times daily as needed for itching.     Varenicline Tartrate (TYRVAYA) 0.03 MG/ACT SOLN Place 1 spray into the nose 2 (two) times daily.     Zinc  50 MG TABS Take 1 tablet Daily  0   No current facility-administered medications for this visit.    Physical Exam:     BP 130/84   Pulse (!) 48   Ht 5' 9 (1.753 m)   Wt 168 lb 3.2 oz (76.3 kg)   BMI 24.84 kg/m   GENERAL:  Pleasant male in NAD PSYCH: : Cooperative, normal affect NEURO: Alert and oriented x 3, no focal neurologic deficits Rectal exam: Sensation intact and preserved anal wink.  Grade 2 hemorrhoids noted in all positions on exam.  No external anal fissures noted. Normal sphincter tone. No palpable mass. No blood on the exam glove. (Chaperone: Nat Sic, CMA).   IMPRESSION and PLAN:    #1.  Symptomatic internal hemorrhoids: PROCEDURE NOTE: The patient presents with symptomatic grade 2 hemorrhoids, unresponsive to maximal medical therapy, requesting rubber band ligation of symptomatic hemorrhoidal disease.  All risks, benefits and alternative forms of therapy were described and informed consent was obtained.  In the Left Lateral Decubitus position, anoscopic examination revealed grade 2 hemorrhoids in the all position(s).  The anorectum was pre-medicated with RectiCare. The decision was made to band the LL internal hemorrhoid, and the [facility] System was used to perform band ligation without complication.  Digital anorectal examination was then performed to assure proper positioning of the band, and to adjust the banded tissue as required.  The patient was discharged home without pain or other issues.  Dietary and behavioral recommendations were given and along with follow-up instructions.    The following adjunctive treatments were recommended:  -Resume high-fiber  diet with fiber supplement (i.e. Citrucel or Benefiber) with goal for soft stools  without straining to have a BM. -Resume adequate fluid intake. - Continue MiraLAX for chronic constipation  The patient will return in 4+ weeks for follow-up and possible additional banding as required. No complications were encountered and the patient tolerated the procedure well.      #2.  Chronic constipation - Resume MiraLAX - Resume adequate hydration      Jay Brooks ,DO, FACG 07/31/2024, 11:20 AM

## 2024-08-22 ENCOUNTER — Telehealth: Payer: Self-pay | Admitting: *Deleted

## 2024-08-22 ENCOUNTER — Telehealth: Payer: Self-pay | Admitting: Gastroenterology

## 2024-08-22 DIAGNOSIS — K649 Unspecified hemorrhoids: Secondary | ICD-10-CM

## 2024-08-22 MED ORDER — HYDROCORTISONE ACETATE 25 MG RE SUPP: 25.0000 mg | Freq: Two times a day (BID) | RECTAL | 0 refills | Status: DC

## 2024-08-22 NOTE — Telephone Encounter (Signed)
 Spoke with patient. Advised we were sending in rx for Anusol suppositories. Also suggested trying sitz bath for some relief. Advised we would check back with him tomorrow to see how medication is working. Patient voiced understanding.

## 2024-08-22 NOTE — Telephone Encounter (Signed)
 Let's try Anusol suppositories twice daily (12/0). Followup tomorrow after he has used this today/tonight/tomorrow AM. Thanks. GM

## 2024-08-22 NOTE — Telephone Encounter (Signed)
 Spoke with patient.  Is is s/p banding 9/24.  Initially everything went well. Over the past 2 weeks he has had worsening discomfort and now he is having constant burning, itching, and pain.  He is  using witch hazel wipes, has dried recticare, lidocaine , Prep H.  He has not tried any of the compounded / rx meds.  He wants to know what else can he try for the discomfort, and if he can possibly get in sooner to have the next banding.

## 2024-08-22 NOTE — Telephone Encounter (Signed)
 Patient requesting to speak with a nurse in regards to discomfort he is having. Also requesting sooner apt ad to speak about medication management. Please advise.

## 2024-08-23 ENCOUNTER — Telehealth: Payer: Self-pay | Admitting: *Deleted

## 2024-08-23 NOTE — Telephone Encounter (Signed)
 Followed up with patient.  He reports hemorrhoid symptoms have improved with the new suppositories. Made patient earlier appt for Banding # 2 09/05/24 per patient request.

## 2024-08-26 ENCOUNTER — Ambulatory Visit: Payer: Commercial Managed Care - PPO | Admitting: Nurse Practitioner

## 2024-08-26 DIAGNOSIS — J069 Acute upper respiratory infection, unspecified: Secondary | ICD-10-CM | POA: Diagnosis not present

## 2024-09-02 DIAGNOSIS — R350 Frequency of micturition: Secondary | ICD-10-CM | POA: Diagnosis not present

## 2024-09-05 ENCOUNTER — Encounter: Payer: Self-pay | Admitting: Gastroenterology

## 2024-09-05 ENCOUNTER — Ambulatory Visit: Admitting: Gastroenterology

## 2024-09-05 VITALS — BP 138/82 | HR 67 | Wt 171.5 lb

## 2024-09-05 DIAGNOSIS — K5909 Other constipation: Secondary | ICD-10-CM | POA: Diagnosis not present

## 2024-09-05 DIAGNOSIS — K641 Second degree hemorrhoids: Secondary | ICD-10-CM

## 2024-09-05 DIAGNOSIS — K649 Unspecified hemorrhoids: Secondary | ICD-10-CM

## 2024-09-05 NOTE — Patient Instructions (Signed)
 _______________________________________________________  If your blood pressure at your visit was 140/90 or greater, please contact your primary care physician to follow up on this.  _______________________________________________________  If you are age 65 or older, your body mass index should be between 23-30. Your Body mass index is 25.33 kg/m. If this is out of the aforementioned range listed, please consider follow up with your Primary Care Provider.  If you are age 63 or younger, your body mass index should be between 19-25. Your Body mass index is 25.33 kg/m. If this is out of the aformentioned range listed, please consider follow up with your Primary Care Provider.   ________________________________________________________  The [facility] providers would like to encourage you to use MYCHART to communicate with providers for non-urgent requests or questions.  Due to long hold times on the telephone, sending your provider a message by [facility] may be a faster and more efficient way to get a response.  Please allow 48 business hours for a response.  Please remember that this is for non-urgent requests.  _______________________________________________________  [facility] is using a team-based approach to care.  Your team is made up of your doctor and two to three APPS. Our APPS (Nurse Practitioners and Physician Assistants) work with your physician to ensure care continuity for you. They are fully qualified to address your health concerns and develop a treatment plan. They communicate directly with your gastroenterologist to care for you. Seeing the Advanced Practice Practitioners on your physician's team can help you by facilitating care more promptly, often allowing for earlier appointments, access to diagnostic testing, procedures, and other specialty referrals.   HEMORRHOID BANDING PROCEDURE    FOLLOW-UP CARE   The procedure you have had should have been relatively  painless since the banding of the area involved does not have nerve endings and there is no pain sensation.  The rubber band cuts off the blood supply to the hemorrhoid and the band may fall off as soon as 48 hours after the banding (the band may occasionally be seen in the toilet bowl following a bowel movement). You may notice a temporary feeling of fullness in the rectum which should respond adequately to plain Tylenol or Motrin.  Following the banding, avoid strenuous exercise that evening and resume full activity the next day.  A sitz bath (soaking in a warm tub) or bidet is soothing, and can be useful for cleansing the area after bowel movements.     To avoid constipation, take two tablespoons of natural wheat bran, natural oat bran, flax, Benefiber or any over the counter fiber supplement and increase your water intake to 7-8 glasses daily.    Unless you have been prescribed anorectal medication, do not put anything inside your rectum for two weeks: No suppositories, enemas, fingers, etc.  Occasionally, you may have more bleeding than usual after the banding procedure.  This is often from the untreated hemorrhoids rather than the treated one.  Don't be concerned if there is a tablespoon or so of blood.  If there is more blood than this, lie flat with your bottom higher than your head and apply an ice pack to the area. If the bleeding does not stop within a half an hour or if you feel faint, call our office at [phone] or go to the emergency room.  Problems are not common; however, if there is a substantial amount of bleeding, severe pain, chills, fever or difficulty passing urine (very rare) or other problems, you should  call us  at [phone] or report to the nearest emergency room.  Do not stay seated continuously for more than 2-3 hours for a day or two after the procedure.  Tighten your buttock muscles 10-15 times every two hours and take 10-15 deep breaths every 1-2 hours.  Do  not spend more than a few minutes on the toilet if you cannot empty your bowel; instead re-visit the toilet at a later time.   It was a pleasure to see you today!  Vito Cirigliano, D.O.

## 2024-09-05 NOTE — Progress Notes (Signed)
 Chief Complaint:    Symptomatic Internal Hemorrhoids; Hemorrhoid Band Ligation   HPI:    Patient is a 63 y.o. male with a history of symptomatic internal hemorrhoids referred to me by Dr. Wilhelmenia for consideration of hemorrhoid banding.  Patient has a longstanding history of symptomatic grade 2 hemorrhoids, unresponsive to maximal medical therapy, and is requesting rubber band ligation of symptomatic hemorrhoidal disease. Medications have been less efficacious over time.    History of constipation, treated with MiraLAX 1 cap daily with improvement.  - 07/31/2024: Banding of LL hemorrhoid  Initially with good response to initial banding, but has had recurrence of perianal itching.  No improvement with witch hazel wipes, RectiCare, OTC lidocaine , Preparation H.  Sent in Rx for Anusol suppositories twice daily and recommended sitz bath's with good response.  Today, he states he had a good response to Anusol suppositories, took those for about 2 days and feeling better.   Endoscopic History: - EGD 03/2022: Mild, non- specific distal gastritis. Biopsied to check for H. pylori. - A few small ( 4- 8mm) soft, fleshy, classic appearing fundic gland polyps in the proximal stomach. - The examination was otherwise normal. - Colonoscopy 07/2021: Diverticulosis in the left colon. Small internal hemorrhoids. The examination was otherwise normal on direct and retroflexion views.  Repeat 10 years. - EGD 03/2024: Normal esophagus, 2 cm hiatal hernia, multiple small fundic land polyps, mild non-H. pylori gastritis, normal duodenum with benign path     Review of systems:     No chest pain, no SOB, no fevers, no urinary sx   Past Medical History:  Diagnosis Date   Allergy    Asthma    Chronic cough    Chronic sinusitis    Diverticulitis 2018   Mild   Diverticulosis of colon (without mention of hemorrhage)    Early satiety    Esophageal motility disorder 12/2018   Family history of adverse reaction  to anesthesia    mom has PONV   GERD (gastroesophageal reflux disease)    History of kidney stones    2017   History of left inguinal hernia    Hypertension    Insomnia    Lyme disease    QUESTIONABLE   Pulmonary nodules 10/2018   Scattered tiny subpleural pulmonary nodules at the peripheral right lung base, largest 3 mm   Right inguinal hernia 2017   Small fat containing right inguinal hernia   Sleep apnea    CPAP   Sleep deficient    Testosterone  deficiency    Vitamin D  deficiency    Wears glasses     Patient's surgical history, family medical history, social history, medications and allergies were all reviewed in Epic    Current Outpatient Medications  Medication Sig Dispense Refill   albuterol  (VENTOLIN  HFA) 108 (90 Base) MCG/ACT inhaler Inhale into the lungs every 6 (six) hours as needed for wheezing or shortness of breath.     Azelastine  HCl 137 MCG/SPRAY SOLN as needed.     bisoprolol -hydrochlorothiazide  (ZIAC ) 2.5-6.25 MG tablet Take  1 tablet  Daily  for BP                                                                 /  TAKE                                         BY                                                 MOUTH 90 tablet 3   calcium carbonate (TUMS EX) 750 MG chewable tablet Chew 1 tablet by mouth daily.     celecoxib (CELEBREX) 200 MG capsule Take 200 mg by mouth daily.     cholecalciferol (VITAMIN D ) 25 MCG (1000 UNIT) tablet Take 1,000 Units by mouth daily. 10,000 units     clindamycin (CLEOCIN T) 1 % lotion Apply 1 application  topically at bedtime as needed.     famotidine  (PEPCID ) 20 MG tablet Take 20 mg by mouth daily.     fluticasone  (FLONASE) 50 MCG/ACT nasal spray Place 2 sprays into both nostrils daily.      hydrocortisone (ANUSOL-HC) 25 MG suppository Place 1 suppository (25 mg total) rectally every 12 (twelve) hours. 12 suppository 0   hydrocortisone cream 1 % Apply 1 application  topically daily as needed for itching.     loratadine (CLARITIN) 10 MG tablet Take 10 mg by mouth daily.     montelukast  (SINGULAIR ) 10 MG tablet TAKE 1 TABLET BY MOUTH DAILY FOR ALLERGIES 90 tablet 2   Olopatadine HCl 0.2 % SOLN Place 1 drop into both eyes daily as needed.     PULMICORT FLEXHALER 180 MCG/ACT inhaler Inhale 1 puff into the lungs 2 (two) times daily.     RABEprazole  (ACIPHEX ) 20 MG tablet TAKE 1 TABLET BY MOUTH TWICE A DAY 180 tablet 4   tamsulosin  (FLOMAX ) 0.4 MG CAPS capsule TAKE 1 CAPSULE AT BEDTIME FOR PROSTATE 90 capsule 2   triamcinolone cream (KENALOG) 0.1 % Apply 1 application topically 2 (two) times daily as needed for itching.     Varenicline Tartrate (TYRVAYA) 0.03 MG/ACT SOLN Place 1 spray into the nose 2 (two) times daily.     Zinc  50 MG TABS Take 1 tablet Daily  0   No current facility-administered medications for this visit.    Physical Exam:     Wt 171 lb 8 oz (77.8 kg)   BMI 25.33 kg/m   GENERAL:  Pleasant male in NAD PSYCH: : Cooperative, normal affect Rectal exam: Sensation intact and preserved anal wink.  Grade 1 hemorrhoids noted in RP and RA position on exam.  No external anal fissures noted. Normal sphincter tone. No palpable mass. No blood on the exam glove. (Chaperone: Nat Sic, CMA).   IMPRESSION and PLAN:    #1.  Symptomatic internal hemorrhoids: PROCEDURE NOTE: The patient presents with symptomatic grade 1-2 hemorrhoids, unresponsive to maximal medical therapy, requesting rubber band ligation of symptomatic hemorrhoidal disease.  All risks, benefits and alternative forms of therapy were described and informed consent was obtained.  In the Left Lateral Decubitus position, anoscopic examination revealed grade 1 hemorrhoids in the RP and RA position(s).  The anorectum was pre-medicated with RectiCare. The decision was made to band the RP internal hemorrhoid, and the [facility]an System was used to perform band ligation without  complication.  Digital anorectal examination was then performed to assure proper positioning of the band, and to  adjust the banded tissue as required.  The patient was discharged home without pain or other issues.  Dietary and behavioral recommendations were given and along with follow-up instructions.     The following adjunctive treatments were recommended:  -Resume high-fiber diet with fiber supplement (i.e. Citrucel or Benefiber) with goal for soft stools without straining to have a BM. -Resume adequate fluid intake.  The patient will return in 4+ weeks for follow-up and possible additional banding as required. No complications were encountered and the patient tolerated the procedure well.      #2.  Chronic constipation - Resume MiraLAX - Resume adequate hydration      Sandor GAILS Kalil Woessner ,DO, FACG 09/05/2024, 10:17 AM

## 2024-10-07 ENCOUNTER — Encounter: Admitting: Gastroenterology

## 2024-10-09 ENCOUNTER — Encounter: Payer: Commercial Managed Care - PPO | Admitting: Nurse Practitioner

## 2024-10-22 ENCOUNTER — Ambulatory Visit: Admitting: Podiatry

## 2024-10-22 DIAGNOSIS — L600 Ingrowing nail: Secondary | ICD-10-CM | POA: Diagnosis not present

## 2024-10-22 NOTE — Patient Instructions (Signed)

## 2024-10-22 NOTE — Progress Notes (Signed)
 Patient complains of painful thickened ingrown hallux nail right. Patient denies fevers, chills, nausea, vomiting.  Objective:  Vitals: Reviewed  General: Well developed, nourished, in no acute distress, alert and oriented x3   Vascular: DP pulse 2/4 bilateral. PT pulse 2/4 bilateral.  Mild edema toe with ingrown nail.  Capillary refill time immediate bilaterally  Dermatology: Erythema, edema, incurvated nail border both hallux right with thickened gryphotic nail with no drainage . Tenderness present with palpation. Normal skin tone and texture feet with normal hair growth.  Neurological: Grossly intact. Normal reflexes.   Musculoskeletal: Tenderness with palpation of the distal hallux right. No tenderness or painful ROM at IPJ.  Diagnosis: Ingrown nail hallux nail right  Plan: -discussed etiology and treatment of ingrown nails. Discussed surgical vs conservative treatment.  Recommended permanent removal of the entire nail plate right -Consent signed for appropriate matrixectomy affected nail(s).   Procedure(s):   - Matrixectomy(s) total nail hallux right: Toe anesthetized with 3cc 2:1 mixture 2% Lidocaine  with epinephrine : Sodium Bicarbonate. Surgical site prepped. Digital tourniquet applied.  Avulsion of nail plate. performed. Matrixecomy performed with three 30 second applications of phenol to nail matrix. Site irrigated with alcohol.  Tourniquet released with good vascularity noticed in digit.  Applied triple antibiotic to nailbed and applied gauze and Coban dressing. - Written and oral postoperative instructions given.  -Return for post-op 2 weeks.  JINNY Prentice Binder, DPM

## 2024-10-25 ENCOUNTER — Ambulatory Visit: Admitting: Gastroenterology

## 2024-10-25 ENCOUNTER — Encounter: Payer: Self-pay | Admitting: Gastroenterology

## 2024-10-25 VITALS — BP 128/80 | HR 60 | Ht 69.0 in | Wt 171.2 lb

## 2024-10-25 DIAGNOSIS — K649 Unspecified hemorrhoids: Secondary | ICD-10-CM

## 2024-10-25 DIAGNOSIS — L290 Pruritus ani: Secondary | ICD-10-CM

## 2024-10-25 DIAGNOSIS — K641 Second degree hemorrhoids: Secondary | ICD-10-CM

## 2024-10-25 DIAGNOSIS — K5909 Other constipation: Secondary | ICD-10-CM

## 2024-10-25 NOTE — Patient Instructions (Addendum)
 _______________________________________________________  If your blood pressure at your visit was 140/90 or greater, please contact your primary care physician to follow up on this.  _______________________________________________________  If you are age 65 or older, your body mass index should be between 23-30. Your Body mass index is 25.29 kg/m. If this is out of the aforementioned range listed, please consider follow up with your Primary Care Provider.  If you are age 34 or younger, your body mass index should be between 19-25. Your Body mass index is 25.29 kg/m. If this is out of the aformentioned range listed, please consider follow up with your Primary Care Provider.   ________________________________________________________  The [facility] providers would like to encourage you to use MYCHART to communicate with providers for non-urgent requests or questions.  Due to long hold times on the telephone, sending your provider a message by [facility] may be a faster and more efficient way to get a response.  Please allow 48 business hours for a response.  Please remember that this is for non-urgent requests.  _______________________________________________________  [facility] is using a team-based approach to care.  Your team is made up of your doctor and two to three APPS. Our APPS (Nurse Practitioners and Physician Assistants) work with your physician to ensure care continuity for you. They are fully qualified to address your health concerns and develop a treatment plan. They communicate directly with your gastroenterologist to care for you. Seeing the Advanced Practice Practitioners on your physician's team can help you by facilitating care more promptly, often allowing for earlier appointments, access to diagnostic testing, procedures, and other specialty referrals.   Please purchase the following medications over the counter and take as directed:  START: Capsaicin cream 0.025%  three times daily for 14 days.  HEMORRHOID BANDING PROCEDURE    FOLLOW-UP CARE   The procedure you have had should have been relatively painless since the banding of the area involved does not have nerve endings and there is no pain sensation.  The rubber band cuts off the blood supply to the hemorrhoid and the band may fall off as soon as 48 hours after the banding (the band may occasionally be seen in the toilet bowl following a bowel movement). You may notice a temporary feeling of fullness in the rectum which should respond adequately to plain Tylenol  or Motrin.  Following the banding, avoid strenuous exercise that evening and resume full activity the next day.  A sitz bath (soaking in a warm tub) or bidet is soothing, and can be useful for cleansing the area after bowel movements.     To avoid constipation, take two tablespoons of natural wheat bran, natural oat bran, flax, Benefiber or any over the counter fiber supplement and increase your water intake to 7-8 glasses daily.    Unless you have been prescribed anorectal medication, do not put anything inside your rectum for two weeks: No suppositories, enemas, fingers, etc.  Occasionally, you may have more bleeding than usual after the banding procedure.  This is often from the untreated hemorrhoids rather than the treated one.  Dont be concerned if there is a tablespoon or so of blood.  If there is more blood than this, lie flat with your bottom higher than your head and apply an ice pack to the area. If the bleeding does not stop within a half an hour or if you feel faint, call our office at [phone] or go to the emergency room.  Problems are not common;  however, if there is a substantial amount of bleeding, severe pain, chills, fever or difficulty passing urine (very rare) or other problems, you should call us  at [phone] or report to the nearest emergency room.  Do not stay seated continuously for more than 2-3 hours for  a day or two after the procedure.  Tighten your buttock muscles 10-15 times every two hours and take 10-15 deep breaths every 1-2 hours.  Do not spend more than a few minutes on the toilet if you cannot empty your bowel; instead re-visit the toilet at a later time.  It was a pleasure to see you today!  Vito Cirigliano, D.O.

## 2024-10-25 NOTE — Progress Notes (Signed)
 "  Chief Complaint:    Symptomatic Internal Hemorrhoids; Hemorrhoid Band Ligation   HPI:    Patient is a 63 y.o. male with a history of symptomatic internal hemorrhoids referred to me by Dr. Wilhelmenia for consideration of hemorrhoid banding.  Patient has a longstanding history of symptomatic grade 2 hemorrhoids, unresponsive to maximal medical therapy, and is requesting rubber band ligation of symptomatic hemorrhoidal disease. Medications have been less efficacious over time.    History of constipation, treated with MiraLAX 1 cap daily with improvement.   - 07/31/2024: Banding of LL hemorrhoid - 09/05/2024: Banding of RP hemorrhoid   Has overall had good response to hemorrhoid banding so far.  Still with episodic perianal itching, but overall much improved.  Will use Preparation H a few days/week for pruritus ani.  Previously no improvement with witch hazel wipes, RectiCare, OTC lidocaine , Preparation H.  Did respond to Anusol  suppositories and sitz bath's.    No new labs or abdominal imaging for review since last appointment with me.   Endoscopic History: - EGD 03/2022: Mild, non- specific distal gastritis. Biopsied to check for H. pylori. - A few small ( 4- 8mm) soft, fleshy, classic appearing fundic gland polyps in the proximal stomach. - The examination was otherwise normal. - Colonoscopy 07/2021: Diverticulosis in the left colon. Small internal hemorrhoids. The examination was otherwise normal on direct and retroflexion views.  Repeat 10 years. - EGD 03/2024: Normal esophagus, 2 cm hiatal hernia, multiple small fundic land polyps, mild non-H. pylori gastritis, normal duodenum with benign path   Review of systems:     No chest pain, no SOB, no fevers, no urinary sx   Past Medical History:  Diagnosis Date   Allergy    Asthma    Chronic cough    Chronic sinusitis    Diverticulitis 2018   Mild   Diverticulosis of colon (without mention of hemorrhage)    Early satiety    Esophageal  motility disorder 12/2018   Family history of adverse reaction to anesthesia    mom has PONV   GERD (gastroesophageal reflux disease)    History of kidney stones    2017   History of left inguinal hernia    Hypertension    Insomnia    Lyme disease    QUESTIONABLE   Pulmonary nodules 10/2018   Scattered tiny subpleural pulmonary nodules at the peripheral right lung base, largest 3 mm   Right inguinal hernia 2017   Small fat containing right inguinal hernia   Sleep apnea    CPAP   Sleep deficient    Testosterone  deficiency    Vitamin D  deficiency    Wears glasses     Patient's surgical history, family medical history, social history, medications and allergies were all reviewed in Epic    Current Outpatient Medications  Medication Sig Dispense Refill   albuterol  (VENTOLIN  HFA) 108 (90 Base) MCG/ACT inhaler Inhale into the lungs every 6 (six) hours as needed for wheezing or shortness of breath.     ARNUITY ELLIPTA  200 MCG/ACT AEPB Inhale 1 puff into the lungs daily.     Azelastine  HCl 137 MCG/SPRAY SOLN as needed.     bisoprolol -hydrochlorothiazide  (ZIAC ) 2.5-6.25 MG tablet Take  1 tablet  Daily  for BP                                                                 /  TAKE                                         BY                                                 MOUTH 90 tablet 3   calcium carbonate (TUMS EX) 750 MG chewable tablet Chew 1 tablet by mouth daily.     celecoxib (CELEBREX) 200 MG capsule Take 200 mg by mouth daily.     cholecalciferol (VITAMIN D ) 25 MCG (1000 UNIT) tablet Take 1,000 Units by mouth daily. 10,000 units     clindamycin (CLEOCIN T) 1 % lotion Apply 1 application  topically at bedtime as needed.     clonazePAM (KLONOPIN) 0.5 MG tablet Take 0.5 mg by mouth at bedtime.     clotrimazole (MYCELEX) 10 MG troche Take 10 mg by mouth 3 (three) times daily.     famotidine  (PEPCID ) 20 MG tablet Take 20  mg by mouth daily.     fluticasone  (FLONASE) 50 MCG/ACT nasal spray Place 2 sprays into both nostrils daily.      hydrocortisone  (ANUSOL -HC) 25 MG suppository Place 1 suppository (25 mg total) rectally every 12 (twelve) hours. 12 suppository 0   hydrocortisone  cream 1 % Apply 1 application topically daily as needed for itching.     loratadine (CLARITIN) 10 MG tablet Take 10 mg by mouth daily.     montelukast  (SINGULAIR ) 10 MG tablet TAKE 1 TABLET BY MOUTH DAILY FOR ALLERGIES 90 tablet 2   Olopatadine HCl 0.2 % SOLN Place 1 drop into both eyes daily as needed.     PULMICORT FLEXHALER 180 MCG/ACT inhaler Inhale 1 puff into the lungs 2 (two) times daily.     RABEprazole  (ACIPHEX ) 20 MG tablet TAKE 1 TABLET BY MOUTH TWICE A DAY 180 tablet 4   tamsulosin  (FLOMAX ) 0.4 MG CAPS capsule TAKE 1 CAPSULE AT BEDTIME FOR PROSTATE 90 capsule 2   triamcinolone cream (KENALOG) 0.1 % Apply 1 application topically 2 (two) times daily as needed for itching.     Varenicline Tartrate (TYRVAYA) 0.03 MG/ACT SOLN Place 1 spray into the nose 2 (two) times daily.     Zinc  50 MG TABS Take 1 tablet Daily  0   No current facility-administered medications for this visit.    Physical Exam:     There were no vitals taken for this visit.  GENERAL:  Pleasant male in NAD PSYCH: : Cooperative, normal affect EENT:  conjunctiva pink, mucous membranes moist, neck supple without masses CARDIAC:  RRR, no murmur heard, no peripheral edema PULM: Normal respiratory effort, lungs CTA bilaterally, no wheezing ABDOMEN:  Nondistended, soft, nontender. No obvious masses, no hepatomegaly,  normal bowel sounds SKIN:  turgor, no lesions seen Musculoskeletal:  Normal muscle tone, normal strength NEURO: Alert and oriented x 3, no focal neurologic deficits Rectal exam: Sensation intact and preserved anal wink.  No external anal fissures, skin tags, skin breakdown noted.  Small grade 1 hemorrhoid in RA position.  Normal sphincter tone. No  palpable mass. No blood on the exam glove. (Chaperone: Nat Sic, CMA).   IMPRESSION and PLAN:    #1.  Symptomatic internal hemorrhoids: PROCEDURE NOTE: The patient presents with symptomatic grade  2 hemorrhoids, unresponsive to maximal medical therapy, requesting rubber band ligation of symptomatic hemorrhoidal disease.  All risks, benefits and alternative forms of therapy were described and informed consent was obtained.  In the Left Lateral Decubitus position, anoscopic examination revealed grade 1 hemorrhoids in the RA position(s).  The anorectum was pre-medicated with RectiCare. The decision was made to band the RA internal hemorrhoid, and the [facility] ORegan System was used to perform band ligation without complication.  Digital anorectal examination was then performed to assure proper positioning of the band, and to adjust the banded tissue as required.  The patient was discharged home without pain or other issues.  Dietary and behavioral recommendations were given and along with follow-up instructions.     The following adjunctive treatments were recommended:  -Resume high-fiber diet with fiber supplement (i.e. Citrucel or Benefiber) with goal for soft stools without straining to have a BM. -Resume adequate fluid intake.  No complications were encountered and the patient tolerated the procedure well.     #2.  Pruritus ani - Cautioned on use of Preparation H or other topical steroids on a prolonged basis - Can trial OTC topical capsaicin cream 0.025% applied up to 3 times daily for 14 days.  If symptoms improving, can titrate down or off completely  #3. Chronic constipation - Resume MiraLAX - Resume adequate hydration   Can follow-up with me on an as-needed basis if recurrence of symptomatic hemorrhoids.  Otherwise, to resume GI follow-up with Dr. Wilhelmenia.      Sandor LULLA Flatter ,DO, FACG 10/25/2024, 2:37 PM  "

## 2024-11-08 ENCOUNTER — Ambulatory Visit: Admitting: Podiatry

## 2024-11-08 ENCOUNTER — Encounter: Payer: Self-pay | Admitting: Podiatry

## 2024-11-08 DIAGNOSIS — L600 Ingrowing nail: Secondary | ICD-10-CM

## 2024-11-08 NOTE — Progress Notes (Signed)
 Patient presents follow-up nail surgery toe.  No complaints.  Physical exam:  Dermatologic: Nail surgery site for matrixectomy healing well with no signs of infection.  Diagnosis: 1.  Status post matrixectomy toe.  Healing well  Plan: - POV status post nail surgery , if patient has any problems she can call for appointment otherwise we can see her as needed  - Return as needed

## 2024-12-03 ENCOUNTER — Telehealth: Payer: Self-pay | Admitting: Gastroenterology

## 2024-12-03 NOTE — Telephone Encounter (Signed)
 Patient called stating he is still having rectal itching. States he has tried different things but still is having itching. Requesting a call to discuss other recommendations. Please advise, thank you

## 2024-12-04 NOTE — Telephone Encounter (Signed)
 The pt has complaints of Hemorrhoids with itching   He has tried Witch hazel wipes, recticare, sitz baths, prep H supp with little to no relief.  He has not tried Anusol  supp but will do that and call back if this does not help.    His bowels are soft- 2 movements daily  I did educate him on keeping the area clean and dry without aggressive wiping.  He will call back in a day or two with an update

## 2024-12-04 NOTE — Telephone Encounter (Signed)
 Left message on machine to call back

## 2024-12-11 ENCOUNTER — Other Ambulatory Visit: Payer: Self-pay | Admitting: Gastroenterology

## 2024-12-11 DIAGNOSIS — K649 Unspecified hemorrhoids: Secondary | ICD-10-CM
